# Patient Record
Sex: Male | Born: 1955 | Race: White | Hispanic: No | Marital: Married | Smoking: Former smoker
Health system: Southern US, Community
[De-identification: ages and names within clinical notes are randomized; demographics above are authoritative.]

## PROBLEM LIST (undated history)

## (undated) DIAGNOSIS — I4510 Unspecified right bundle-branch block: Secondary | ICD-10-CM

## (undated) DIAGNOSIS — E785 Hyperlipidemia, unspecified: Secondary | ICD-10-CM

## (undated) DIAGNOSIS — I10 Essential (primary) hypertension: Secondary | ICD-10-CM

## (undated) DIAGNOSIS — C801 Malignant (primary) neoplasm, unspecified: Secondary | ICD-10-CM

## (undated) DIAGNOSIS — R000 Tachycardia, unspecified: Secondary | ICD-10-CM

## (undated) DIAGNOSIS — Z0389 Encounter for observation for other suspected diseases and conditions ruled out: Secondary | ICD-10-CM

## (undated) DIAGNOSIS — T7840XA Allergy, unspecified, initial encounter: Secondary | ICD-10-CM

## (undated) HISTORY — DX: Encounter for observation for other suspected diseases and conditions ruled out: Z03.89

## (undated) HISTORY — DX: Essential (primary) hypertension: I10

## (undated) HISTORY — DX: Hyperlipidemia, unspecified: E78.5

## (undated) HISTORY — DX: Allergy, unspecified, initial encounter: T78.40XA

## (undated) HISTORY — DX: Unspecified right bundle-branch block: I45.10

## (undated) HISTORY — DX: Tachycardia, unspecified: R00.0

---

## 2002-12-21 HISTORY — PX: OTHER SURGICAL HISTORY: SHX169

## 2003-08-14 ENCOUNTER — Encounter (INDEPENDENT_AMBULATORY_CARE_PROVIDER_SITE_OTHER): Payer: Self-pay | Admitting: *Deleted

## 2003-08-14 ENCOUNTER — Ambulatory Visit (HOSPITAL_BASED_OUTPATIENT_CLINIC_OR_DEPARTMENT_OTHER): Admission: RE | Admit: 2003-08-14 | Discharge: 2003-08-14 | Payer: Self-pay | Admitting: Surgery

## 2005-04-02 ENCOUNTER — Ambulatory Visit: Payer: Self-pay | Admitting: Cardiology

## 2005-04-07 ENCOUNTER — Ambulatory Visit: Payer: Self-pay | Admitting: Cardiology

## 2005-04-21 ENCOUNTER — Ambulatory Visit: Payer: Self-pay | Admitting: Cardiology

## 2005-05-06 ENCOUNTER — Ambulatory Visit: Payer: Self-pay | Admitting: Cardiology

## 2005-06-10 ENCOUNTER — Ambulatory Visit: Payer: Self-pay | Admitting: Physician Assistant

## 2005-06-25 ENCOUNTER — Ambulatory Visit: Payer: Self-pay | Admitting: Cardiology

## 2005-11-25 ENCOUNTER — Ambulatory Visit: Payer: Self-pay | Admitting: Cardiology

## 2006-12-23 ENCOUNTER — Ambulatory Visit: Payer: Self-pay | Admitting: Cardiology

## 2007-12-28 ENCOUNTER — Encounter: Payer: Self-pay | Admitting: Cardiology

## 2008-01-19 ENCOUNTER — Encounter: Payer: Self-pay | Admitting: Physician Assistant

## 2008-01-19 ENCOUNTER — Ambulatory Visit: Payer: Self-pay | Admitting: Cardiology

## 2008-08-10 ENCOUNTER — Encounter: Payer: Self-pay | Admitting: Cardiology

## 2009-01-22 ENCOUNTER — Ambulatory Visit: Payer: Self-pay | Admitting: Cardiology

## 2009-10-02 DIAGNOSIS — R0989 Other specified symptoms and signs involving the circulatory and respiratory systems: Secondary | ICD-10-CM | POA: Insufficient documentation

## 2009-10-02 DIAGNOSIS — I4510 Unspecified right bundle-branch block: Secondary | ICD-10-CM

## 2009-10-02 DIAGNOSIS — E785 Hyperlipidemia, unspecified: Secondary | ICD-10-CM

## 2009-10-11 ENCOUNTER — Encounter: Payer: Self-pay | Admitting: Cardiology

## 2010-02-24 ENCOUNTER — Ambulatory Visit: Payer: Self-pay | Admitting: Cardiology

## 2010-02-24 DIAGNOSIS — I10 Essential (primary) hypertension: Secondary | ICD-10-CM | POA: Insufficient documentation

## 2010-03-03 ENCOUNTER — Ambulatory Visit: Payer: Self-pay | Admitting: Cardiology

## 2011-01-20 NOTE — Assessment & Plan Note (Signed)
Summary: bp check  Nurse Visit   Vital Signs:  Patient profile:   55 year old male Height:      72 inches Weight:      177 pounds Pulse rate:   69 / minute BP sitting:   126 / 72  (left arm) Cuff size:   regular  Vitals Entered By: Carlye Grippe (March 03, 2010 8:41 AM) CC: nurse BP check Comments ZO:XWRUEA-VW HTN--yes UJW:JXBJYN meds?--yes Side effects?--no Chest pain, SOB, Dizziness?--no A/P: 1. HTN (401.1)             At goal?              If no, physician will be notified.              Follow up in ...Marland KitchenMarland KitchenMarland Kitchen  5 minutes was spent with the patient. Patient hasn't started new med yet,chlorthalidone.      Allergies: No Known Drug Allergies  Orders Added: 1)  Est. Patient Level I [82956]  Appended Document: bp check Patient informed of the above via machine.

## 2011-01-20 NOTE — Assessment & Plan Note (Signed)
Summary: 1 YR FU PER FEB REMINDER-SRS   Visit Type:  Follow-up Primary Provider:  Christell Constant  CC:  follow-up visit.  History of Present Illness: the patient is a 55 year old male with a history of tachycardia palpitations.  he has a history of atrial tachycardia.  He has been intolerant to high-dose beta-blockers in the past.he has had a prior stress test which was within normal limits.  He denies any recent palpitations.  He has occasional heartburn and is under significant stress.  Is quite hypertensive in the office today with a diastolic blood pressure of 89 mm of mercury.  The patient otherwise denies palpitations, presyncope or syncope.  He has no orthopnea PND.  Clinical Review Panels:  CXR CXR results The heart and mediastinum are within normal limits. The lung fields         are clear. The visualized soft tissues and skeleton are within normal         limits.                       IMPRESSION:           CHEST RADIOGRAPH IS WITHIN NORMAL LIMITS.  (04/01/2005)    Preventive Screening-Counseling & Management  Alcohol-Tobacco     Smoking Status: quit     Year Quit: 2005  Current Medications (verified): 1)  Diltiazem Hcl Coated Beads 120 Mg Xr24h-Cap (Diltiazem Hcl Coated Beads) .... Take 1 Capsule By Mouth Once A Day 2)  Metoprolol Tartrate 50 Mg Tabs (Metoprolol Tartrate) .... Take 1/2 Tablet By Mouth Twice A Day 3)  Aspir-Trin 325 Mg Tbec (Aspirin) .... Take 1 Tablet By Mouth Once A Day As Needed 4)  Chlorthalidone 25 Mg Tabs (Chlorthalidone) .... Take 1 Tablet By Mouth Once A Day  Allergies (verified): No Known Drug Allergies  Comments:  Nurse/Medical Assistant: The patient's medications and allergies were reviewed with the patient and were updated in the Medication and Allergy Lists. Bottles reviewed.  Past History:  Past Medical History: HYPERLIPIDEMIA-MIXED (ICD-272.4) RBBB (ICD-426.4) TACHYCARDIA (ICD-785)  History of questionable atrial tachycardia normal  left ventricle function by echo in 2006 chronic incomplete right bundle branch block history of tobacco use dyslipidemia negative nuclear perfusion study for ischemia in 2006.  Review of Systems  The patient denies fatigue, malaise, fever, weight gain/loss, vision loss, decreased hearing, hoarseness, chest pain, palpitations, shortness of breath, prolonged cough, wheezing, sleep apnea, coughing up blood, abdominal pain, blood in stool, nausea, vomiting, diarrhea, heartburn, incontinence, blood in urine, muscle weakness, joint pain, leg swelling, rash, skin lesions, headache, fainting, dizziness, depression, anxiety, enlarged lymph nodes, easy bruising or bleeding, and environmental allergies.    Vital Signs:  Patient profile:   55 year old male Height:      72 inches Weight:      176 pounds BMI:     23.96 Pulse rate:   69 / minute BP sitting:   134 / 89  (left arm) Cuff size:   regular  Vitals Entered By: Carlye Grippe (February 24, 2010 3:16 PM)  Serial Vital Signs/Assessments:  Time      Position  BP       Pulse  Resp  Temp     By 4:20 PM             143/90                         Carlye Grippe  CC: follow-up  visit   Physical Exam  Additional Exam:  General: Well-developed, well-nourished in no distress head: Normocephalic and atraumatic eyes PERRLA/EOMI intact, conjunctiva and lids normal nose: No deformity or lesions mouth normal dentition, normal posterior pharynx neck: Supple, no JVD.  No masses, thyromegaly or abnormal cervical nodes lungs: Normal breath sounds bilaterally without wheezing.  Normal percussion heart: regular rate and rhythm with normal S1 and S2, no S3 or S4.  PMI is normal.  No pathological murmurs abdomen: Normal bowel sounds, abdomen is soft and nontender without masses, organomegaly or hernias noted.  No hepatosplenomegaly musculoskeletal: Back normal, normal gait muscle strength and tone normal pulsus: Pulse is normal in all 4  extremities Extremities: No peripheral pitting edema neurologic: Alert and oriented x 3 skin: Intact without lesions or rashes cervical nodes: No significant adenopathy psychologic: Normal affect    EKG  Procedure date:  02/24/2010  Findings:      normal sinus rhythm.  Incomplete right bundle branch block.  Otherwise normal tracing heart rate 61 beats/min  Impression & Recommendations:  Problem # 1:  ESSENTIAL HYPERTENSION, BENIGN (ICD-401.1) the patient is significantly hypertensive and I started him on chlorthalidone 25 mg p.o. daily.  On recheck of his blood pressure it remained elevated. His updated medication list for this problem includes:    Diltiazem Hcl Coated Beads 120 Mg Xr24h-cap (Diltiazem hcl coated beads) .Marland Kitchen... Take 1 capsule by mouth once a day    Metoprolol Tartrate 50 Mg Tabs (Metoprolol tartrate) .Marland Kitchen... Take 1/2 tablet by mouth twice a day    Aspir-trin 325 Mg Tbec (Aspirin) .Marland Kitchen... Take 1 tablet by mouth once a day as needed    Chlorthalidone 25 Mg Tabs (Chlorthalidone) .Marland Kitchen... Take 1 tablet by mouth once a day  Problem # 2:  TACHYCARDIA (ICD-785) no recurrent palpitations.  EKG demonstrates normal sinus rhythm. Orders: EKG w/ Interpretation (93000)  Problem # 3:  RBBB (ICD-426.4) incomplete right bundle blanch block no change. His updated medication list for this problem includes:    Diltiazem Hcl Coated Beads 120 Mg Xr24h-cap (Diltiazem hcl coated beads) .Marland Kitchen... Take 1 capsule by mouth once a day    Metoprolol Tartrate 50 Mg Tabs (Metoprolol tartrate) .Marland Kitchen... Take 1/2 tablet by mouth twice a day    Aspir-trin 325 Mg Tbec (Aspirin) .Marland Kitchen... Take 1 tablet by mouth once a day as needed  Problem # 4:  HYPERLIPIDEMIA-MIXED (ICD-272.4) followed by the patient's primary care physician.  Patient Instructions: 1)  Chlorthalidone 25mg  daily 2)  Nurse visit in one week for blood pressure recheck.  (March 14 at 9:00) 3)  Follow up in  1 year.   Prescriptions: METOPROLOL  TARTRATE 50 MG TABS (METOPROLOL TARTRATE) Take 1/2 tablet by mouth twice a day  #30 x 11   Entered by:   Hoover Brunette, LPN   Authorized by:   Lewayne Bunting, MD, [facility]   Signed by:   Hoover Brunette, LPN on 16/09/9603   Method used:   Electronically to        [facility] # [phone]* (retail)       [address]       [place], [place]  [address]       Ph: [phone] or [phone]       Fax: [phone]   RxID:   [id number] DILTIAZEM HCL COATED BEADS 120 MG XR24H-CAP (DILTIAZEM HCL COATED BEADS) Take 1 capsule by mouth once a day  #30 Capsule x 11   Entered  by:   Hoover Brunette, LPN   Authorized by:   Lewayne Bunting, MD, [facility]   Signed by:   Hoover Brunette, LPN on 57/84/6962   Method used:   Electronically to        [facility] [address] # [phone]* (retail)       [address]       [place], [place]  [address]       Ph: [phone] or [phone]       Fax: [phone]   RxID:   [id number] CHLORTHALIDONE 25 MG TABS (CHLORTHALIDONE) Take 1 tablet by mouth once a day  #30 x 11   Entered by:   Hoover Brunette, LPN   Authorized by:   Lewayne Bunting, MD, [facility]   Signed by:   Hoover Brunette, LPN on 60/09/9322   Method used:   Electronically to        [facility] # [phone]* (retail)       [address]       [place], [place]  [address]       Ph: [phone] or [phone]       Fax: [phone]   RxID:   [id number]   Handout requested.

## 2011-03-24 ENCOUNTER — Encounter: Payer: Self-pay | Admitting: Cardiology

## 2011-03-24 ENCOUNTER — Ambulatory Visit (INDEPENDENT_AMBULATORY_CARE_PROVIDER_SITE_OTHER): Payer: 59 | Admitting: Cardiology

## 2011-03-24 DIAGNOSIS — F17200 Nicotine dependence, unspecified, uncomplicated: Secondary | ICD-10-CM

## 2011-03-24 DIAGNOSIS — R0989 Other specified symptoms and signs involving the circulatory and respiratory systems: Secondary | ICD-10-CM

## 2011-03-24 DIAGNOSIS — I10 Essential (primary) hypertension: Secondary | ICD-10-CM

## 2011-03-24 DIAGNOSIS — R002 Palpitations: Secondary | ICD-10-CM

## 2011-03-24 MED ORDER — METOPROLOL TARTRATE 25 MG PO TABS: 25.0000 mg | ORAL_TABLET | Freq: Two times a day (BID) | ORAL | Status: DC

## 2011-03-24 MED ORDER — CHLORTHALIDONE 25 MG PO TABS: ORAL_TABLET | ORAL | Status: DC

## 2011-03-24 MED ORDER — DILTIAZEM HCL ER COATED BEADS 120 MG PO CP24: 120.0000 mg | ORAL_CAPSULE | Freq: Every day | ORAL | Status: DC

## 2011-03-24 NOTE — Patient Instructions (Signed)
Begin Chlorthalidone 12.5mg  daily Your physician wants you to follow up in:  1 year.  You will receive a reminder letter in the mail one-two months in advance.  If you don't receive a letter, please call our office to schedule the follow up appointment

## 2011-04-01 DIAGNOSIS — F17200 Nicotine dependence, unspecified, uncomplicated: Secondary | ICD-10-CM | POA: Insufficient documentation

## 2011-04-01 NOTE — Assessment & Plan Note (Signed)
We will start chlorthalidone

## 2011-04-01 NOTE — Progress Notes (Signed)
HPI The patient is a 55 year old male with a history of tachycardia palpitations. He has a history of atrial tachycardia. He has been intolerant to high-dose beta blockers in the past. He also had a prior Cardiolite which within normal limits. During his last office visit he was quite hypertensive. He did have a negative perfusion study in 2006. The patient was started on chlorthalidone 25 of PEEP daily during the last office visit for his hypertension. The patient's blood pressure again slightly elevated today. We will recheck this before departure from the clinic. We had previously started chlorthalidone potassium was discontinued because the patient's blood pressure was normal upon nurses it. The patient reports rare palpitations. His electrocardiogram was reviewed and found to be within normal limits. I also performed a bedside echocardiogram the patient has normal LV function with no significant wall motion abnormalities and a normal aortic and mitral valve.  No Known Allergies  No current outpatient prescriptions on file prior to visit.    Past Medical History  Diagnosis Date  . Hyperlipidemia   . Right bundle branch block   . Tachycardia   . Dyslipidemia   . Other specified cardiac dysrhythmias     Past Surgical History  Procedure Date  . Left inguinal hernia. 2004    No family history on file.  History   Social History  . Marital Status: Married    Spouse Name: Salome,[name]    Number of Children: N/A  . Years of Education: N/A   Occupational History  .  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 Smoking status: Former Smoker -- 1.0 packs/day for 30 years    Types: Cigarettes    Quit date: 03/21/2005  . Smokeless tobacco: Not on file  . Alcohol Use: No  . Drug Use: No  . Sexually Active: Not on file   Other Topics Concern  . Not on file   Social History Narrative  . No narrative on file   Review of systems:Pertinent positives as  outlined above. The remainder of the 18  point review of systems is negative   PHYSICAL EXAM BP 148/88  Pulse 64  Ht 6' (1.829 m)  Wt 175 lb (79.379 kg)  BMI 23.73 kg/m2  General: Well-developed, well-nourished in no distress Head: Normocephalic and atraumatic Eyes:PERRLA/EOMI intact, conjunctiva and lids normal Ears: No deformity or lesions Mouth:normal dentition, normal posterior pharynx Neck: Supple, no JVD.  No masses, thyromegaly or abnormal cervical nodes Lungs: Normal breath sounds bilaterally without wheezing.  Normal percussion Cardiac: regular rate and rhythm with normal S1 and S2, no S3 or S4.  PMI is normal.  No pathological murmurs Abdomen: Normal bowel sounds, abdomen is soft and nontender without masses, organomegaly or hernias noted.  No hepatosplenomegaly MSK: Back normal, normal gait muscle strength and tone normal Vascular: Pulse is normal in all 4 extremities Extremities: No peripheral pitting edema Neurologic: Alert and oriented x 3 Skin: Intact without lesions or rashes Lymphatics: No significant adenopathy Psychologic: Normal affect  ECG: Normal sinus rhythm. Otherwise normal EKG.  ASSESSMENT AND PLAN

## 2011-04-01 NOTE — Assessment & Plan Note (Signed)
Patient was counseled regarding this

## 2011-05-05 NOTE — Assessment & Plan Note (Signed)
[facility]                          [facility] CARDIOLOGY OFFICE NOTE   Martin Martin, UMSCHEID                     MRN:          [id number]  DATE:01/22/2009                            DOB:          1956/11/27    HISTORY OF PRESENT ILLNESS:  [name] is a 55 year old male with a  prior history of tachy palpitations, but no exertional angina or  dyspnea.  He underwent a prior stress test which was within normal  limits.  The patient still reports occasional palpitations which last 10-  15 minutes; however, he also has some issues with anxiety.  His biggest  problem right now is also that he cannot sleep because he has ruminating  thoughts in the evening and feels fatigued throughout the daytime.  He  has no orthopnea or PND.  He has no presyncope or syncope.  He has no  known history of coronary artery disease.   EKG in the office today demonstrates normal sinus rhythm with incomplete  right bundle branch block, unchanged from prior tracings.   CURRENT MEDICATIONS:  1. Diltiazem XR 120 mg in the morning.  2. Metoprolol 50 mg half tablet twice a day.   PHYSICAL EXAMINATION:  VITAL SIGNS:  Blood pressure is 137/85, heart  rate 61, and weight 185 pounds.  NECK:  Normal carotid upstroke and no carotid bruits.  LUNGS:  Clear breath sounds bilaterally.  HEART:  Regular rate and rhythm with normal S1 and S2.  No murmur, rub,  or gallops.  ABDOMEN:  Soft and nontender.  No rebound or guarding, and good bowel  sounds.  EXTREMITIES:  No cyanosis, clubbing, or edema.  NEURO:  The patient is alert, oriented, and grossly nonfocal.   IMPRESSION:  1. History of questionable atrial tachycardia.      a.     Quiescent on beta-blocker and calcium channel blocker.       However, the patient still as strong heartbeats.      b.     Intolerant to higher dose BETA-BLOCKER secondary to fatigue.  2. Normal left ventricular function by echo in 2006.  3. Chronic  incomplete right bundle branch block.  4. History of tobacco use.  5. Dyslipidemia.  6. Negative nuclear perfusion study negative for ischemia in 2006.   PLAN:  1. The patient's biggest problem is generalized anxiety disorder      manifested by agitation during the daytime and nighttime sleeping      problems.  I have written for the patient a      prescription for trazodone 50 mg to increase after 1 week to 100 mg      p.o. nightly.  2. The patient does not need any further cardiac stress testing.  He      has been told to call our office if he has any problems with      trazodone and otherwise we will see him back in 1 year.     Learta Codding, MD,FACC  Electronically Signed    GED/MedQ  DD: 01/22/2009  DT: 01/23/2009  Job #: [id number]

## 2011-05-05 NOTE — Assessment & Plan Note (Signed)
[facility]                          [place] [facility] OFFICE NOTE   DUWARD, ALLBRITTON                       MRN:          [id number]  DATE:01/19/2008                            DOB:          08-02-1956    PRIMARY CARDIOLOGIST:  Learta Codding, MD, [facility]   REASON FOR VISIT:  Annual follow-up.   Mr. Kuhnle returns for annual follow-up, with history of atrial  tachycardia and no known history of coronary artery disease.   The patient reports no interim development of recurrent tachy  palpitations, since last seen here in the clinic in January 2008.  He  also denies any development of exertional angina pectoris or dyspnea.   A recent lipid profile was reviewed with him, and notable for total  cholesterol of 220, triglyceride 71, HDL 58 and LDL 156.  In reviewing  these results, I recommended initial treatment with diet/exercise  regimen and I reemphasized this point to him, today.   At time of his last visit, I also recommended adding low-dose aspirin to  his medication regimen, for primary prevention.  He informs me today  that he takes this when he remembers to do so, but clearly not on a  daily basis.   Electrocardiogram today reveals NSR with chronic incomplete RBB.   CURRENT MEDICATIONS:  1. Diltiazem XR 120 q. a.m.  2. Metoprolol 25 mg b.i.d.   PHYSICAL EXAM:  Blood pressure 133/85, pulse 65, regular weight 174.6  (down seven).  GENERAL:  A 55 year old male sitting upright in no distress.  HEENT: Normocephalic, atraumatic.  NECK: Palpable bilateral carotid pulses without bruits.  LUNGS:  Clear to auscultation in all fields.  HEART: Regular rate and rhythm (S1, S2). No murmurs or rubs.  ABDOMEN: Soft, nontender.  EXTREMITIES: Palpable pulses without edema.  NEURO: No focal deficit.   IMPRESSION:  1. History of atrial tachycardia.      a.     Quiescent on beta blocker/calcium channel blocker regimen.      b.     INTOLERANT TO HIGHER  DOSE BETA BLOCKER, SECONDARY TO       FATIGUE.  2. Normal left ventricular function.      a.     By 2-D echocardiogram in 2006.  3. Chronic iRBBB.  4. History of tobacco.  5. Dyslipidemia   PLAN:  1. Recommend initial diet/exercise treatment for dyslipidemia.  We      will reassess this with a follow-up fasting lipid/liver profile in      6 months.  2. Patient encouraged, once again, to take low-dose aspirin on a daily      basis, for primary prevention.  3. Schedule return clinic follow-up of with myself and Dr. Andee Lineman in 1      year.      Gene Serpe, PA-C  Electronically Signed      Learta Codding, MD,FACC  Electronically Signed   GS/MedQ  DD: 01/19/2008  DT: 01/19/2008  Job #: [id number]   cc:   Ernestina Penna, M.D.

## 2011-05-08 NOTE — Assessment & Plan Note (Signed)
[facility]                          [facility] [facility] OFFICE NOTE   AMBROSIO, REUTER                       MRN:          [id number]  DATE:12/23/2006                            DOB:          September 20, 1956    PRIMARY CARDIOLOGIST:  Learta Codding, MD   REASON FOR OFFICE VISIT:  Scheduled annual follow-up.   [name] is a 55 year old male with a history of atrial tachycardia  which has been successfully treated with a combination of a beta blocker  and calcium channel blocker.  He has no known history of coronary artery  disease.  When last seen by me in June 2006, we down-titrated his beta  blocker secondary to complaint of fatigue.  This apparently ameliorated  his fatigue and he has been tolerating this combination regimen since  then.   The patient has only an occasional sensation of a hard heart beat but  no irregularity and no fluttering.  He continues to work and denies any  exertional chest discomfort.  An adenosine stress Cardiolite in April  2006 was negative.  2 D echocardiogram at that same time was also  normal.   The patient has not smoked tobacco since April 2006.  Of note, he has  not had a recent fasting lipid profile.   Electrocardiogram today revealed normal sinus rhythm at 72 BPM with  normal axis and incomplete right bundle branch block.   CURRENT MEDICATIONS:  1. Lopressor 25 mg b.i.d.  2. Diltiazem XR 120 mg daily.   PHYSICAL EXAMINATION:  VITAL SIGNS:  Blood pressure 122/82, pulse 72,  regular, weight 181.  GENERAL:  A 55 year old male sitting upright in no distress.  NECK:  Palpable bilateral carotid pulse without bruit.  LUNGS:  Clear to auscultation in all fields.  HEART:  Regular rate and rhythm (S1, S2), no murmurs, rubs or gallops.  EXTREMITIES:  Palpable pulses without edema.  NEUROLOGIC:  No focal deficits.   IMPRESSION:  1. History of atrial tachycardia.      a.     Quiescent on beta blocker/calcium  channel blocker regimen.  2. History of fatigue.      a.     Secondary to higher dose of beta blocker.  3. Normal left ventricular function.  4. History of tobacco.  5. Incomplete right bundle branch block.   PLAN:  1. Start low-dose baby aspirin for primary prevention.  2. Order a fasting lipid profile for assessment of his lipid status.  3. Continue regular follow-up with Dr. Lewayne Bunting in one year.      Gene Serpe, PA-C  Electronically Signed      Learta Codding, MD,FACC  Electronically Signed   GS/MedQ  DD: 12/23/2006  DT: 12/23/2006  Job #: [id number]   cc:   Ernestina Penna, M.D.

## 2011-05-08 NOTE — Op Note (Signed)
NAMEHAMDAN, TOSCANO                          ACCOUNT NO.:  [id number]   MEDICAL RECORD NO.:  [id number]                   PATIENT TYPE:  AMB   LOCATION:  DSC                                  FACILITY:  [facility]   PHYSICIAN:  Sandria Bales. Ezzard Standing, M.D.               DATE OF BIRTH:  October 18, 1956   DATE OF PROCEDURE:  08/14/2003  DATE OF DISCHARGE:                                 OPERATIVE REPORT   PREOPERATIVE DIAGNOSIS:  Left inguinal hernia.   POSTOPERATIVE DIAGNOSIS:  Moderate-sized indirect left inguinal hernia.   OPERATION PERFORMED:  Left inguinal herniorrhaphy with mesh repair.   SURGEON:  Sandria Bales. Ezzard Standing, M.D.   ASSISTANT:  None.   ANESTHESIA:  MAC with about 26mL of local anesthetic.   COMPLICATIONS:  None.   INDICATIONS FOR PROCEDURE:  Mr. Kramp is a 55 year old white male with  symptomatic left inguinal hernia who now comes for repair of this hernia.  The indications and potential complications were explained to the patient.  Potential complications include but are not limited to bleeding, infection,  nerve injury, recurrence of the inguinal hernia.  I also discussed the  options of laparoscopic versus open herniorrhaphy and the patient has  elected to proceed with an open inguinal hernia repair.   DESCRIPTION OF PROCEDURE:  The patient presented to [name], was placed in supine position and his left groin was shaved, prepped  with Betadine solution and sterilely draped.   The patient was given a MAC anesthesia.  I used local anesthetic with 1%  Xylocaine with epinephrine and infiltrated about 26mL in different layers.  I made incision through the skin to the external oblique fascia which was  also blocked.  The external ring was opened.  The cord structures were  encircled with a Penrose drain. The external ring was noted to be quite a  bit enlarged by this hernia which has been descending down the cord  structures out this external ring.   The inguinal floor actually looked pretty good, but the patient had a medium  to large indirect inguinal hernia with a very broad neck and protruded about  6 or 7 cm.  I freed the indirect hernia off the cord structures, I opened  the sac, I freed some of the fat off so I could twist the sac and then  ligated it with a 0 chromic suture.  I then carried out an inguinal floor  repair using Atrium mesh.  The Atrium mesh was sewn medially to the pubic  tubercle inferiorly to the shelving ligament, superiorly to the  transversalis fascia.  A key hole was cut for the internal ring and then the  mesh overlapped behind and sewn.  I used 0 Novofil sutures in interrupted  fashion to put the mesh in place.   The cord structures were then returned to their normal location.  The wound  was irrigated.  The external oblique fascia closed with interrupted 3-0  Vicryl sutures.  Subcutaneous tissues closed with 3-0 Vicryl sutures and  skin  closed with a 5-0 Monocryl suture and painted with tincture of benzoin.  The  patient tolerated the procedure well and was transported to recovery room in  good condition.  The sponge and needle counts were correct at the end of  this case.                                               Sandria Bales. Ezzard Standing, M.D.    DHN/MEDQ  D:  08/14/2003  T:  08/14/2003  Job:  [id number]   cc:   Montey Hora, PA   Ernestina Penna, M.D.  [address] [place]  [place] [address]  Fax: [phone]

## 2012-03-04 ENCOUNTER — Encounter: Payer: Self-pay | Admitting: Cardiology

## 2012-03-04 ENCOUNTER — Ambulatory Visit (INDEPENDENT_AMBULATORY_CARE_PROVIDER_SITE_OTHER): Payer: 59 | Admitting: Cardiology

## 2012-03-04 VITALS — BP 134/73 | HR 61 | Ht 72.0 in | Wt 170.0 lb

## 2012-03-04 DIAGNOSIS — I4510 Unspecified right bundle-branch block: Secondary | ICD-10-CM | POA: Insufficient documentation

## 2012-03-04 DIAGNOSIS — E785 Hyperlipidemia, unspecified: Secondary | ICD-10-CM

## 2012-03-04 DIAGNOSIS — R002 Palpitations: Secondary | ICD-10-CM

## 2012-03-04 DIAGNOSIS — R000 Tachycardia, unspecified: Secondary | ICD-10-CM

## 2012-03-04 DIAGNOSIS — I10 Essential (primary) hypertension: Secondary | ICD-10-CM | POA: Insufficient documentation

## 2012-03-04 DIAGNOSIS — Z0389 Encounter for observation for other suspected diseases and conditions ruled out: Secondary | ICD-10-CM

## 2012-03-04 MED ORDER — METOPROLOL TARTRATE 25 MG PO TABS: 25.0000 mg | ORAL_TABLET | Freq: Two times a day (BID) | ORAL | Status: DC

## 2012-03-04 MED ORDER — DILTIAZEM HCL ER COATED BEADS 120 MG PO CP24: 120.0000 mg | ORAL_CAPSULE | Freq: Every day | ORAL | Status: DC

## 2012-03-04 MED ORDER — CHLORTHALIDONE 25 MG PO TABS: ORAL_TABLET | ORAL | Status: DC

## 2012-03-04 NOTE — Patient Instructions (Signed)
Continue all current medications. Your physician wants you to follow up in:  1 year.  You will receive a reminder letter in the mail one-two months in advance.  If you don't receive a letter, please call our office to schedule the follow up appointment   

## 2012-03-04 NOTE — Progress Notes (Signed)
Martin Bottoms, MD, [facility] Board Certified in Adult Cardiovascular Medicine,Internal Medicine and Critical Care Medicine    CC: Followup patient history of hypertension   HPI:  Patient is a 56 year old male with no history of coronary artery disease. The patient is doing well. He denies any chest pain shortness of breath orthopnea PND. He reports no palpitations. He feels more relaxed now that he has changed jobs. He has not had any blood work drawn and I suggested that he may need to have this done with his primary care physician. During the last office visit we didn't bedside echocardiogram which showed normal heart function. There was no significant valvular heart disease.  PMH: reviewed and listed in Problem List in Electronic Records (and see below) Past Medical History  Diagnosis Date  . Hyperlipidemia   . Right bundle branch block     Normal LV function normal aortic and mitral valve bedside echocardiogram 4 2012  . Tachycardia     Resolved  . Dyslipidemia   . Coronary artery disease (CAD) excluded     Cardiolite study 2006 within normal limits  . Hypertension    Past Surgical History  Procedure Date  . Left inguinal hernia. 2004    Allergies/SH/FHX : available in Electronic Records for review  No Known Allergies History   Social History  . Marital Status: Married    Spouse Name: Spiers,[name]    Number of Children: N/A  . Years of Education: N/A   Occupational History  .  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 Smoking status: Former Smoker -- 1.0 packs/day for 30 years    Types: Cigarettes    Quit date: 03/21/2005  . Smokeless tobacco: Never Used  . Alcohol Use: No  . Drug Use: No  . Sexually Active: Not on file   Other Topics Concern  . Not on file   Social History Narrative  . No narrative on file   No family history on file.  Medications: Current Outpatient Prescriptions  Medication Sig Dispense Refill  .  chlorthalidone (HYGROTON) 25 MG tablet Take 1/2 tab (12.5mg ) daily  45 tablet  3  . diltiazem (CARDIZEM CD) 120 MG 24 hr capsule Take 1 capsule (120 mg total) by mouth daily.  90 capsule  3  . metoprolol tartrate (LOPRESSOR) 25 MG tablet Take 1 tablet (25 mg total) by mouth 2 (two) times daily.  180 tablet  3    ROS: No nausea or vomiting. No fever or chills.No melena or hematochezia.No bleeding.No claudication  Physical Exam: BP 134/73  Pulse 61  Ht 6' (1.829 m)  Wt 170 lb (77.111 kg)  BMI 23.06 kg/m2 General: Well-nourished white male. No distress Neck: Normal curvature and no carotid bruits Lungs: Clear breath sounds bilaterally. No wheezing Cardiac: Regular rate and rhythm with normal S1-S2 no murmur rubs or gallops Vascular: No edema. Normal distal pulses. Skin: Warm and dry Physcologic: Normal affect.  12lead ECG: Normal sinus rhythm no acute ischemic changes. Limited bedside ECHO:N/A No images are attached to the encounter.    Patient Active Problem List  Diagnoses  . HYPERLIPIDEMIA-MIXED  . TACHYCARDIA  . Hypertension  . Coronary artery disease (CAD) excluded  . Dyslipidemia  . Right bundle branch block    PLAN   I recommended to the patient has some blood work from his primary care physician, including a lipid panel CBC and BMET.  From a cardiac standpoint is stable. He  reports no recurrent palpitations.  He has no evidence of structural heart disease. Continue with current medical therapy  Blood pressures now also well controlled on chlorthalidone.

## 2013-03-17 ENCOUNTER — Other Ambulatory Visit: Payer: Self-pay | Admitting: Cardiology

## 2013-03-17 ENCOUNTER — Ambulatory Visit: Payer: 59 | Admitting: Cardiology

## 2013-03-24 ENCOUNTER — Emergency Department (HOSPITAL_COMMUNITY): Payer: BC Managed Care – PPO

## 2013-03-24 ENCOUNTER — Other Ambulatory Visit: Payer: Self-pay | Admitting: Neurosurgery

## 2013-03-24 ENCOUNTER — Encounter (HOSPITAL_COMMUNITY): Payer: Self-pay | Admitting: Neurology

## 2013-03-24 ENCOUNTER — Inpatient Hospital Stay (HOSPITAL_COMMUNITY)
Admission: EM | Admit: 2013-03-24 | Discharge: 2013-04-02 | DRG: 002 | Disposition: A | Discharge status: Home / Self Care | Payer: BC Managed Care – PPO | Attending: Neurosurgery | Admitting: Neurosurgery

## 2013-03-24 DIAGNOSIS — I10 Essential (primary) hypertension: Secondary | ICD-10-CM | POA: Diagnosis present

## 2013-03-24 DIAGNOSIS — E785 Hyperlipidemia, unspecified: Secondary | ICD-10-CM | POA: Diagnosis present

## 2013-03-24 DIAGNOSIS — R4701 Aphasia: Secondary | ICD-10-CM | POA: Diagnosis present

## 2013-03-24 DIAGNOSIS — I4510 Unspecified right bundle-branch block: Secondary | ICD-10-CM | POA: Diagnosis present

## 2013-03-24 DIAGNOSIS — G40109 Localization-related (focal) (partial) symptomatic epilepsy and epileptic syndromes with simple partial seizures, not intractable, without status epilepticus: Secondary | ICD-10-CM | POA: Diagnosis present

## 2013-03-24 DIAGNOSIS — R51 Headache: Secondary | ICD-10-CM | POA: Diagnosis present

## 2013-03-24 DIAGNOSIS — G936 Cerebral edema: Secondary | ICD-10-CM | POA: Diagnosis present

## 2013-03-24 DIAGNOSIS — Z87891 Personal history of nicotine dependence: Secondary | ICD-10-CM

## 2013-03-24 DIAGNOSIS — Z885 Allergy status to narcotic agent status: Secondary | ICD-10-CM

## 2013-03-24 DIAGNOSIS — N200 Calculus of kidney: Secondary | ICD-10-CM | POA: Diagnosis present

## 2013-03-24 DIAGNOSIS — C713 Malignant neoplasm of parietal lobe: Principal | ICD-10-CM | POA: Diagnosis present

## 2013-03-24 DIAGNOSIS — R569 Unspecified convulsions: Secondary | ICD-10-CM | POA: Diagnosis present

## 2013-03-24 DIAGNOSIS — D496 Neoplasm of unspecified behavior of brain: Secondary | ICD-10-CM

## 2013-03-24 LAB — CBC
MCH: 31.4 pg (ref 26.0–34.0)
MCHC: 35.4 g/dL (ref 30.0–36.0)
MCV: 88.6 fL (ref 78.0–100.0)
Platelets: 218 10*3/uL (ref 150–400)
RBC: 4.91 MIL/uL (ref 4.22–5.81)

## 2013-03-24 LAB — URINALYSIS, ROUTINE W REFLEX MICROSCOPIC
Bilirubin Urine: NEGATIVE
Glucose, UA: NEGATIVE mg/dL
Ketones, ur: 15 mg/dL — AB
pH: 5 (ref 5.0–8.0)

## 2013-03-24 LAB — ETHANOL: Alcohol, Ethyl (B): <11 mg/dL (ref 0–11)

## 2013-03-24 LAB — DIFFERENTIAL
Basophils Relative: 1 % (ref 0–1)
Eosinophils Absolute: 0.1 10*3/uL (ref 0.0–0.7)
Eosinophils Relative: 2 % (ref 0–5)
Lymphs Abs: 1.7 10*3/uL (ref 0.7–4.0)

## 2013-03-24 LAB — POCT I-STAT, CHEM 8
BUN: 25 mg/dL — ABNORMAL HIGH (ref 6–23)
Calcium, Ion: 1.1 mmol/L — ABNORMAL LOW (ref 1.12–1.23)
Chloride: 104 mEq/L (ref 96–112)
Glucose, Bld: 138 mg/dL — ABNORMAL HIGH (ref 70–99)

## 2013-03-24 LAB — COMPREHENSIVE METABOLIC PANEL
ALT: 20 U/L (ref 0–53)
BUN: 26 mg/dL — ABNORMAL HIGH (ref 6–23)
Calcium: 9.1 mg/dL (ref 8.4–10.5)
GFR calc Af Amer: >90 mL/min (ref >90)
Glucose, Bld: 137 mg/dL — ABNORMAL HIGH (ref 70–99)
Sodium: 140 mEq/L (ref 135–145)
Total Protein: 7.1 g/dL (ref 6.0–8.3)

## 2013-03-24 LAB — RAPID URINE DRUG SCREEN, HOSP PERFORMED
Benzodiazepines: NOT DETECTED
Cocaine: NOT DETECTED

## 2013-03-24 LAB — PROTIME-INR: Prothrombin Time: 12.5 seconds (ref 11.6–15.2)

## 2013-03-24 LAB — POCT I-STAT TROPONIN I: Troponin i, poc: 0 ng/mL (ref 0.00–0.08)

## 2013-03-24 LAB — MRSA PCR SCREENING: MRSA by PCR: NEGATIVE

## 2013-03-24 LAB — URINE MICROSCOPIC-ADD ON

## 2013-03-24 LAB — TROPONIN I: Troponin I: <0.3 ng/mL (ref <0.30)

## 2013-03-24 MED ORDER — PANTOPRAZOLE SODIUM 40 MG PO TBEC
40.0000 mg | DELAYED_RELEASE_TABLET | Freq: Every day | ORAL | Status: DC
  Administered 2013-03-24 – 2013-03-28 (×5): 40 mg via ORAL
  Filled 2013-03-24 (×5): qty 1

## 2013-03-24 MED ORDER — HYDROMORPHONE HCL PF 1 MG/ML IJ SOLN: 2.0000 mg | INTRAMUSCULAR | Status: DC | PRN

## 2013-03-24 MED ORDER — ONDANSETRON HCL 4 MG/2ML IJ SOLN: 4.0000 mg | Freq: Once | INTRAMUSCULAR | Status: AC

## 2013-03-24 MED ORDER — HYDROMORPHONE HCL PF 1 MG/ML IJ SOLN: 2.0000 mg | Freq: Once | INTRAMUSCULAR | Status: AC

## 2013-03-24 MED ORDER — HYDROMORPHONE HCL PF 2 MG/ML IJ SOLN
2.0000 mg | Freq: Once | INTRAMUSCULAR | Status: AC
  Administered 2013-03-24: 2 mg via INTRAVENOUS
  Filled 2013-03-24: qty 1

## 2013-03-24 MED ORDER — SODIUM CHLORIDE 0.9 % IV SOLN
10.0000 mg | Freq: Once | INTRAVENOUS | Status: AC
  Administered 2013-03-24: 10 mg via INTRAVENOUS
  Filled 2013-03-24: qty 1

## 2013-03-24 MED ORDER — PROMETHAZINE HCL 25 MG PO TABS: 12.5000 mg | ORAL_TABLET | ORAL | Status: DC | PRN

## 2013-03-24 MED ORDER — SODIUM CHLORIDE 0.9 % IV SOLN
1000.0000 mg | Freq: Once | INTRAVENOUS | Status: AC
  Administered 2013-03-24: 1000 mg via INTRAVENOUS
  Filled 2013-03-24: qty 10

## 2013-03-24 MED ORDER — LEVETIRACETAM 500 MG/5ML IV SOLN
500.0000 mg | Freq: Two times a day (BID) | INTRAVENOUS | Status: DC
  Administered 2013-03-24 – 2013-03-26 (×4): 500 mg via INTRAVENOUS
  Filled 2013-03-24 (×5): qty 5

## 2013-03-24 MED ORDER — HYDROCODONE-ACETAMINOPHEN 10-325 MG PO TABS
1.0000 | ORAL_TABLET | ORAL | Status: DC | PRN
  Administered 2013-03-25 – 2013-03-28 (×7): 1 via ORAL
  Filled 2013-03-24 (×9): qty 1

## 2013-03-24 MED ORDER — ONDANSETRON HCL 4 MG/2ML IJ SOLN
INTRAMUSCULAR | Status: AC
  Administered 2013-03-24: 4 mg
  Filled 2013-03-24: qty 2

## 2013-03-24 MED ORDER — LORAZEPAM 2 MG/ML IJ SOLN
1.0000 mg | Freq: Once | INTRAMUSCULAR | Status: AC
  Administered 2013-03-24: 1 mg via INTRAVENOUS
  Filled 2013-03-24: qty 1

## 2013-03-24 MED ORDER — ONDANSETRON HCL 4 MG/2ML IJ SOLN
INTRAMUSCULAR | Status: AC
  Administered 2013-03-24: 4 mg via INTRAVENOUS
  Filled 2013-03-24: qty 2

## 2013-03-24 MED ORDER — PROMETHAZINE HCL 25 MG/ML IJ SOLN
12.5000 mg | INTRAMUSCULAR | Status: DC | PRN
  Administered 2013-03-24 – 2013-03-25 (×2): 25 mg via INTRAVENOUS
  Filled 2013-03-24 (×3): qty 1

## 2013-03-24 MED ORDER — GADOBENATE DIMEGLUMINE 529 MG/ML IV SOLN
15.0000 mL | Freq: Once | INTRAVENOUS | Status: AC
  Administered 2013-03-24: 15 mL via INTRAVENOUS

## 2013-03-24 MED ORDER — METOPROLOL TARTRATE 25 MG PO TABS
25.0000 mg | ORAL_TABLET | Freq: Two times a day (BID) | ORAL | Status: DC
  Administered 2013-03-25 – 2013-04-02 (×12): 25 mg via ORAL
  Filled 2013-03-24 (×20): qty 1

## 2013-03-24 MED ORDER — DILTIAZEM HCL ER COATED BEADS 120 MG PO CP24
120.0000 mg | ORAL_CAPSULE | Freq: Every day | ORAL | Status: DC
  Administered 2013-03-24 – 2013-04-02 (×9): 120 mg via ORAL
  Filled 2013-03-24 (×10): qty 1

## 2013-03-24 MED ORDER — ONDANSETRON HCL 4 MG/2ML IJ SOLN
4.0000 mg | Freq: Three times a day (TID) | INTRAMUSCULAR | Status: DC | PRN
  Administered 2013-03-24: 4 mg via INTRAVENOUS
  Filled 2013-03-24: qty 2

## 2013-03-24 MED ORDER — SODIUM CHLORIDE 0.9 % IV SOLN
INTRAVENOUS | Status: DC
  Administered 2013-03-24: 75 mL/h via INTRAVENOUS
  Administered 2013-03-25: 01:00:00 via INTRAVENOUS
  Administered 2013-03-25: 75 mL/h via INTRAVENOUS
  Administered 2013-03-26: 05:00:00 via INTRAVENOUS

## 2013-03-24 MED ORDER — HYDROMORPHONE HCL PF 1 MG/ML IJ SOLN
INTRAMUSCULAR | Status: AC
  Administered 2013-03-24: 2 mg
  Filled 2013-03-24: qty 2

## 2013-03-24 MED ORDER — DEXAMETHASONE SODIUM PHOSPHATE 4 MG/ML IJ SOLN
4.0000 mg | Freq: Four times a day (QID) | INTRAMUSCULAR | Status: DC
  Administered 2013-03-24 – 2013-03-27 (×12): 4 mg via INTRAVENOUS
  Filled 2013-03-24 (×16): qty 1

## 2013-03-24 NOTE — Consult Note (Addendum)
NEURO HOSPITALIST CONSULT NOTE    Reason for Consult: New-onset visual changes and confusion.  HPI:                                                                                                                                          Martin Martin is an 57 y.o. male with a history of hypertension and hyperlipidemia presenting with new onset visual changes and confusion. Wife noted that his speech output at times make no sense. He was complaining of visual changes that description was vague and difficult to understand. He complained of a headache as well. He did not appear to have focal weakness. Still previous history of similar symptoms. CT scan of his head showed a low density area involving the left parietal region with vasogenic edema indicative of probable mass lesion. MRI showed left parietal contrast-enhancing lesion, likely glioblastoma.  Past Medical History  Diagnosis Date  . Hyperlipidemia   . Right bundle branch block     Normal LV function normal aortic and mitral valve bedside echocardiogram 4 2012  . Tachycardia     Resolved  . Dyslipidemia   . Coronary artery disease (CAD) excluded     Cardiolite study 2006 within normal limits  . Hypertension     Past Surgical History  Procedure Laterality Date  . Left inguinal hernia.  2004    No family history on file.   Social History:  reports that he quit smoking about 8 years ago. His smoking use included Cigarettes. He has a 30 pack-year smoking history. He has never used smokeless tobacco. He reports that he does not drink alcohol or use illicit drugs.  No Known Allergies  MEDICATIONS:                                                                                                                     Prior to Admission:  Metoprolol 25 mg twice a day Cardizem extended release 120 mg per day  ROS:  History obtained from patient's spouse   General ROS: negative for - chills, fatigue, fever, night sweats, weight gain or weight loss Psychological ROS: negative for - behavioral disorder, hallucinations, memory difficulties, mood swings or suicidal ideation Ophthalmic ROS: negative for - blurry vision, double vision, eye pain or loss of vision ENT ROS: negative for - epistaxis, nasal discharge, oral lesions, sore throat, tinnitus or vertigo Allergy and Immunology ROS: negative for - hives or itchy/watery eyes Hematological and Lymphatic ROS: negative for - bleeding problems, bruising or swollen lymph nodes Endocrine ROS: negative for - galactorrhea, hair pattern changes, polydipsia/polyuria or temperature intolerance Respiratory ROS: negative for - cough, hemoptysis, shortness of breath or wheezing Cardiovascular ROS: negative for - chest pain, dyspnea on exertion, edema or irregular heartbeat Gastrointestinal ROS: negative for - abdominal pain, diarrhea, hematemesis, nausea/vomiting or stool incontinence Genito-Urinary ROS: negative for - dysuria, hematuria, incontinence or urinary frequency/urgency Musculoskeletal ROS: negative for - joint swelling or muscular weakness Neurological ROS: as noted in HPI; no recent headaches nor mental status changes; no visual changes prior to today.  Dermatological ROS: negative for rash and skin lesion changes   Blood pressure 140/76, pulse 84, temperature 99.1 F (37.3 C), temperature source Oral, resp. rate 14, SpO2 99.00%.   Neurologic Examination:                                                                                                      Mental Status: Alert, moderately severe receptive and expressive aphasia. Cranial Nerves: II-dense right homonymous hemianopsia. III/IV/VI-Pupils were equal and reacted. Extraocular movements were full and conjugate.    V/VII-no facial numbness; mild right lower facial  weakness. VIII-normal. X-normal speech and symmetrical palatal movement. XII-midline tongue extension Motor: 5/5 bilaterally with normal tone and bulk Sensory: Normal throughout. Deep Tendon Reflexes: 2+ and symmetric. Plantars: Flexor bilaterally Cerebellar: Normal finger-to-nose testing.   No results found for this basename: cbc, bmp, coags, chol, tri, ldl, hga1c    Results for orders placed during the hospital encounter of 03/24/13 (from the past 48 hour(s))  APTT     Status: None   Collection Time    03/24/13  9:35 AM      Result Value Range   aPTT 28  24 - 37 seconds  CBC     Status: None   Collection Time    03/24/13  9:35 AM      Result Value Range   WBC 8.0  4.0 - 10.5 K/uL   RBC 4.91  4.22 - 5.81 MIL/uL   Hemoglobin 15.4  13.0 - 17.0 g/dL   HCT 40.9  81.1 - 91.4 %   MCV 88.6  78.0 - 100.0 fL   MCH 31.4  26.0 - 34.0 pg   MCHC 35.4  30.0 - 36.0 g/dL   RDW 78.2  95.6 - 21.3 %   Platelets 218  150 - 400 K/uL  DIFFERENTIAL     Status: None   Collection Time    03/24/13  9:35 AM      Result Value Range   Neutrophils Relative 70  43 - 77 %   Neutro Abs 5.6  1.7 - 7.7 K/uL   Lymphocytes Relative 21  12 - 46 %   Lymphs Abs 1.7  0.7 - 4.0 K/uL   Monocytes Relative 7  3 - 12 %   Monocytes Absolute 0.6  0.1 - 1.0 K/uL   Eosinophils Relative 2  0 - 5 %   Eosinophils Absolute 0.1  0.0 - 0.7 K/uL   Basophils Relative 1  0 - 1 %   Basophils Absolute 0.0  0.0 - 0.1 K/uL  TROPONIN I     Status: None   Collection Time    03/24/13  9:35 AM      Result Value Range   Troponin I <0.30  <0.30 ng/mL   Comment:            Due to the release kinetics of cTnI,     a negative result within the first hours     of the onset of symptoms does not rule out     myocardial infarction with certainty.     If myocardial infarction is still suspected,     repeat the test at appropriate intervals.  POCT I-STAT TROPONIN I     Status: None   Collection Time    03/24/13  9:40 AM       Result Value Range   Troponin i, poc 0.00  0.00 - 0.08 ng/mL   Comment 3            Comment: Due to the release kinetics of cTnI,     a negative result within the first hours     of the onset of symptoms does not rule out     myocardial infarction with certainty.     If myocardial infarction is still suspected,     repeat the test at appropriate intervals.  POCT I-STAT, CHEM 8     Status: Abnormal   Collection Time    03/24/13  9:42 AM      Result Value Range   Sodium 142  135 - 145 mEq/L   Potassium 3.4 (*) 3.5 - 5.1 mEq/L   Chloride 104  96 - 112 mEq/L   BUN 25 (*) 6 - 23 mg/dL   Creatinine, Ser 4.09  0.50 - 1.35 mg/dL   Glucose, Bld 811 (*) 70 - 99 mg/dL   Calcium, Ion 9.14 (*) 1.12 - 1.23 mmol/L   TCO2 26  0 - 100 mmol/L   Hemoglobin 16.0  13.0 - 17.0 g/dL   HCT 78.2  95.6 - 21.3 %    Ct Head Wo Contrast  03/24/2013  *RADIOLOGY REPORT*  Clinical Data:  Code stroke, acute onset right eye visual field loss, difficulty finding words, altered speech patterns, right side weakness, unable to walk, history hyperlipidemia, coronary artery disease, arrhythmia, hypertension, former smoker  CT HEAD WITHOUT CONTRAST  Technique:  Contiguous axial images were obtained from the base of the skull through the vertex without contrast.  Comparison: None  Findings: Normal ventricular morphology. No midline shift. Large area of low attenuation identified at the posterior left parietal lobe, 6.4 x 4.9 cm image 19, question central mass with surrounding vasogenic edema, felt unlikely to represent an infarct. Additional small area of questionable white matter hypoattenuation at left vertex. Remaining brain parenchyma normal in appearance. No intracranial hemorrhage or extra-axial fluid collections. Bones and sinuses unremarkable.  IMPRESSION: 6.4 x 4.9 cm diameter area of low attenuation in left parietal lobe suspicious for  mass with surrounding vasogenic edema, question primary versus metastatic lesion.  Further evaluation by MR imaging of brain with/without contrast recommended.  Critical Value/emergent results were called by telephone at the time of interpretation on 03/24/2013 at 0948 hours to Dr. Roseanne Reno, who verbally acknowledged these results.   Original Report Authenticated By: Ulyses Southward, M.D.     Assessment/Plan: Left parietal contrast-enhancing mass lesion with surrounding vasogenic edema, most likely glioblastoma. New-onset seizure disorder manifested by receptive and expressive aphasia associated with this lesion cannot be ruled out.   Neurosurgery has been consulted. Patient has been given Keppra 1000 mg IV loading dose for probable new-onset seizure activity, and 500 mg of Keppra every 12 hours as recommended for seizure control.  Venetia Maxon M.D. [facility] [phone]  03/24/2013, 10:11 AM

## 2013-03-24 NOTE — Progress Notes (Signed)
Dr Jeral Fruit notified of patient admission

## 2013-03-24 NOTE — ED Notes (Signed)
In MRI with patient. Pt calm and relaxed after ativan

## 2013-03-24 NOTE — Progress Notes (Signed)
Pt had sudden stabbing severe left lower back/flank pain accompanied by vomiting. Dr Jeral Fruit notified. Total 2mg  dilaudid and 4mg  zofran given. Pt pain free and comfortable by 1800

## 2013-03-24 NOTE — ED Notes (Signed)
Spoke with Dr. Cassandria Santee office. Notified no orders have been placed for patient. Needs bed requested for patient. Stating will get in touch with him.

## 2013-03-24 NOTE — Progress Notes (Signed)
UR COMPLETED  

## 2013-03-24 NOTE — ED Notes (Addendum)
Per EMS- [facility]. This morning woke up with headache at 0500. Tried to go to work and was driving but h/a became worse so he came home and his wife took him to UC. Upon arrival at [facility] difficulties with gait. Pt unable to answer basic questions, expressive aphasia. Appears to have periods of normal mentation, being able to follow commands then develops periods of deficits. States vision changes," things are moving". No weakness noted, equal grips. No facial droop noted. BP 138/72, Hr 72, 98%, CBG 160.

## 2013-03-24 NOTE — ED Notes (Signed)
Pt holding head, c/o headache, asking for ginger ale.

## 2013-03-24 NOTE — H&P (Signed)
Martin Martin is an 57 y.o. male.   Chief Complaint: headache HPI: patient complaining of headache, dizziness, difficulties with speech since testerday. Seen in the er and we were called to evaluate. Patient ne  Past Medical History  Diagnosis Date  . Hyperlipidemia   . Right bundle branch block     Normal LV function normal aortic and mitral valve bedside echocardiogram 4 2012  . Tachycardia     Resolved  . Dyslipidemia   . Coronary artery disease (CAD) excluded     Cardiolite study 2006 within normal limits  . Hypertension     Past Surgical History  Procedure Laterality Date  . Left inguinal hernia.  2004    No family history on file. Social History:  reports that he quit smoking about 8 years ago. His smoking use included Cigarettes. He has a 30 pack-year smoking history. He has never used smokeless tobacco. He reports that he does not drink alcohol or use illicit drugs.  Allergies:  Allergies  Allergen Reactions  . Morphine And Related Nausea And Vomiting     (Not in a hospital admission)  Results for orders placed during the hospital encounter of 03/24/13 (from the past 48 hour(s))  ETHANOL     Status: None   Collection Time    03/24/13  9:35 AM      Result Value Range   Alcohol, Ethyl (B) <11  0 - 11 mg/dL   Comment:            LOWEST DETECTABLE LIMIT FOR     SERUM ALCOHOL IS 11 mg/dL     FOR MEDICAL PURPOSES ONLY  PROTIME-INR     Status: None   Collection Time    03/24/13  9:35 AM      Result Value Range   Prothrombin Time 12.5  11.6 - 15.2 seconds   INR 0.94  0.00 - 1.49  APTT     Status: None   Collection Time    03/24/13  9:35 AM      Result Value Range   aPTT 28  24 - 37 seconds  CBC     Status: None   Collection Time    03/24/13  9:35 AM      Result Value Range   WBC 8.0  4.0 - 10.5 K/uL   RBC 4.91  4.22 - 5.81 MIL/uL   Hemoglobin 15.4  13.0 - 17.0 g/dL   HCT 40.9  81.1 - 91.4 %   MCV 88.6  78.0 - 100.0 fL   MCH 31.4  26.0 - 34.0 pg    MCHC 35.4  30.0 - 36.0 g/dL   RDW 78.2  95.6 - 21.3 %   Platelets 218  150 - 400 K/uL  DIFFERENTIAL     Status: None   Collection Time    03/24/13  9:35 AM      Result Value Range   Neutrophils Relative 70  43 - 77 %   Neutro Abs 5.6  1.7 - 7.7 K/uL   Lymphocytes Relative 21  12 - 46 %   Lymphs Abs 1.7  0.7 - 4.0 K/uL   Monocytes Relative 7  3 - 12 %   Monocytes Absolute 0.6  0.1 - 1.0 K/uL   Eosinophils Relative 2  0 - 5 %   Eosinophils Absolute 0.1  0.0 - 0.7 K/uL   Basophils Relative 1  0 - 1 %   Basophils Absolute 0.0  0.0 - 0.1 K/uL  COMPREHENSIVE METABOLIC PANEL     Status: Abnormal   Collection Time    03/24/13  9:35 AM      Result Value Range   Sodium 140  135 - 145 mEq/L   Potassium 3.3 (*) 3.5 - 5.1 mEq/L   Chloride 103  96 - 112 mEq/L   CO2 27  19 - 32 mEq/L   Glucose, Bld 137 (*) 70 - 99 mg/dL   BUN 26 (*) 6 - 23 mg/dL   Creatinine, Ser 1.61  0.50 - 1.35 mg/dL   Calcium 9.1  8.4 - 09.6 mg/dL   Total Protein 7.1  6.0 - 8.3 g/dL   Albumin 4.2  3.5 - 5.2 g/dL   AST 22  0 - 37 U/L   ALT 20  0 - 53 U/L   Alkaline Phosphatase 51  39 - 117 U/L   Total Bilirubin 0.6  0.3 - 1.2 mg/dL   GFR calc non Af Amer >90  >90 mL/min   GFR calc Af Amer >90  >90 mL/min   Comment:            The eGFR has been calculated     using the CKD EPI equation.     This calculation has not been     validated in all clinical     situations.     eGFR's persistently     <90 mL/min signify     possible Chronic Kidney Disease.  TROPONIN I     Status: None   Collection Time    03/24/13  9:35 AM      Result Value Range   Troponin I <0.30  <0.30 ng/mL   Comment:            Due to the release kinetics of cTnI,     a negative result within the first hours     of the onset of symptoms does not rule out     myocardial infarction with certainty.     If myocardial infarction is still suspected,     repeat the test at appropriate intervals.  POCT I-STAT TROPONIN I     Status: None   Collection  Time    03/24/13  9:40 AM      Result Value Range   Troponin i, poc 0.00  0.00 - 0.08 ng/mL   Comment 3            Comment: Due to the release kinetics of cTnI,     a negative result within the first hours     of the onset of symptoms does not rule out     myocardial infarction with certainty.     If myocardial infarction is still suspected,     repeat the test at appropriate intervals.  POCT I-STAT, CHEM 8     Status: Abnormal   Collection Time    03/24/13  9:42 AM      Result Value Range   Sodium 142  135 - 145 mEq/L   Potassium 3.4 (*) 3.5 - 5.1 mEq/L   Chloride 104  96 - 112 mEq/L   BUN 25 (*) 6 - 23 mg/dL   Creatinine, Ser 0.45  0.50 - 1.35 mg/dL   Glucose, Bld 409 (*) 70 - 99 mg/dL   Calcium, Ion 8.11 (*) 1.12 - 1.23 mmol/L   TCO2 26  0 - 100 mmol/L   Hemoglobin 16.0  13.0 - 17.0 g/dL   HCT 91.4  78.2 - 95.6 %  Ct Head Wo Contrast  03/24/2013  *RADIOLOGY REPORT*  Clinical Data:  Code stroke, acute onset right eye visual field loss, difficulty finding words, altered speech patterns, right side weakness, unable to walk, history hyperlipidemia, coronary artery disease, arrhythmia, hypertension, former smoker  CT HEAD WITHOUT CONTRAST  Technique:  Contiguous axial images were obtained from the base of the skull through the vertex without contrast.  Comparison: None  Findings: Normal ventricular morphology. No midline shift. Large area of low attenuation identified at the posterior left parietal lobe, 6.4 x 4.9 cm image 19, question central mass with surrounding vasogenic edema, felt unlikely to represent an infarct. Additional small area of questionable white matter hypoattenuation at left vertex. Remaining brain parenchyma normal in appearance. No intracranial hemorrhage or extra-axial fluid collections. Bones and sinuses unremarkable.  IMPRESSION: 6.4 x 4.9 cm diameter area of low attenuation in left parietal lobe suspicious for mass with surrounding vasogenic edema, question primary  versus metastatic lesion. Further evaluation by MR imaging of brain with/without contrast recommended.  Critical Value/emergent results were called by telephone at the time of interpretation on 03/24/2013 at 0948 hours to Dr. Roseanne Reno, who verbally acknowledged these results.   Original Report Authenticated By: Ulyses Southward, M.D.     Review of Systems  Constitutional: Positive for malaise/fatigue.  Respiratory: Negative.   Cardiovascular: Negative.   Gastrointestinal: Negative.   Musculoskeletal: Negative.   Skin: Negative.   Neurological: Positive for dizziness and headaches.  Endo/Heme/Allergies: Negative.   Psychiatric/Behavioral: Negative.     Blood pressure 140/76, pulse 84, temperature 99.1 F (37.3 C), temperature source Oral, resp. rate 14, SpO2 99.00%. Physical Exam   Assessment/[place]. Nl. Neck, nl. Cv, nl. Lungs, clear. Abdomen, soft. Extremities, nl. NEURO. Oriented x 3 but with paraphasical errors. NormL strength. Sensory and dtr nl. Gait was not tested. Ct head shows a mass in lhe left parietal ares wirth vasogenic edema. Mri brai pending Patient to go to tne neuro icu  Jaqlyn Gruenhagen M 03/24/2013, 10:52 AM

## 2013-03-24 NOTE — ED Notes (Signed)
Pt had episode of vomiting. Given 4 mg zofran per Dr. Roseanne Reno. Pt agitated to commands, sound, light.

## 2013-03-24 NOTE — ED Notes (Signed)
Code stroke called [phone]. Pt arrival 0924, EDP exam [phone], Stroke team arrival [phone], Arrival in CT 0930, phlebotomist at 0900. Code stroke cancelled at 0935.

## 2013-03-24 NOTE — ED Notes (Signed)
Pt had episode of vomiting x 1. 

## 2013-03-24 NOTE — ED Provider Notes (Signed)
History    CSN: [id number] Arrival date & time 03/24/13  4098 First MD Initiated Contact with Patient 03/24/13 [phone]      Chief Complaint  Patient presents with  . Code Stroke    HPI  The patient presented to the emergency room as a code stroke this morning. Patient woke up this morning with a headache at about 5 AM. He was driving to work but his headache became more severe. He went home and his wife took him to an urgent care. At that facility they noted that he had difficulty answering basic questions. Is also having trouble with his gait. He symptoms regarding confusion in his speech seemed to be waxing and waning somewhat. Patient also mentioned that he was having difficulties with his vision. Patient was then transported by ambulance to the emergency room for further evaluation. Patient was evaluated by the stroke team on arrival.  Past Medical History  Diagnosis Date  . Hyperlipidemia   . Right bundle branch block     Normal LV function normal aortic and mitral valve bedside echocardiogram 4 2012  . Tachycardia     Resolved  . Dyslipidemia   . Coronary artery disease (CAD) excluded     Cardiolite study 2006 within normal limits  . Hypertension     Past Surgical History  Procedure Laterality Date  . Left inguinal hernia.  2004    No family history on file.  History  Substance Use Topics  . Smoking status: Former Smoker -- 1.00 packs/day for 30 years    Types: Cigarettes    Quit date: 03/21/2005  . Smokeless tobacco: Never Used  . Alcohol Use: No      Review of Systems  All other systems reviewed and are negative.    Allergies  Morphine and related  Home Medications   Current Outpatient Rx  Name  Route  Sig  Dispense  Refill  . diltiazem (CARDIZEM CD) 120 MG 24 hr capsule   Oral   Take 1 capsule (120 mg total) by mouth daily.   90 capsule   3   . metoprolol tartrate (LOPRESSOR) 25 MG tablet   Oral   Take 25 mg by mouth 2 (two) times daily.            BP 140/76  Pulse 84  Temp(Src) 99.1 F (37.3 C) (Oral)  Resp 14  SpO2 99%  Physical Exam  Nursing note and vitals reviewed. Constitutional: He appears well-developed and well-nourished. No distress.  HENT:  Head: Normocephalic and atraumatic.  Right Ear: External ear normal.  Left Ear: External ear normal.  Eyes: Conjunctivae are normal. Right eye exhibits no discharge. Left eye exhibits no discharge. No scleral icterus.  Neck: Neck supple. No tracheal deviation present.  Cardiovascular: Normal rate, regular rhythm and intact distal pulses.   Pulmonary/Chest: Effort normal and breath sounds normal. No stridor. No respiratory distress. He has no wheezes. He has no rales.  Abdominal: Soft. Bowel sounds are normal. He exhibits no distension. There is no tenderness. There is no rebound and no guarding.  Musculoskeletal: He exhibits no edema and no tenderness.  Neurological: He is alert. He has normal strength. He is disoriented. He displays tremor. A cranial nerve deficit ( no gross defecits noted) is present. No sensory deficit. He exhibits normal muscle tone. He displays no seizure activity. Coordination abnormal. GCS eye subscore is 4. GCS verbal subscore is 4. GCS motor subscore is 6.  Tremor noted of the right upper extremity  and right lower extremity, possible visual field deficit in the right field of vision, aphasia noted, patient has difficulty following commands  Skin: Skin is warm and dry. No rash noted.  Psychiatric: He has a normal mood and affect.    ED Course  Procedures (including critical care time) EKG Normal sinus rhythm Left atrial abnormality Incomplete right bundle branch block No significant change when compared to prior EKG dated 15March 2013  Medications  levETIRAcetam (KEPPRA) 1,000 mg in sodium chloride 0.9 % 100 mL IVPB (1,000 mg Intravenous New Bag/Given 03/24/13 1014)  HYDROmorphone (DILAUDID) injection 2 mg (2 mg Intravenous Given 03/24/13 1008)   ondansetron (ZOFRAN) injection 4 mg (4 mg Intravenous Given 03/24/13 1027)  Pt developed nausea.  Zofran IV ordered.  Labs Reviewed  COMPREHENSIVE METABOLIC PANEL - Abnormal; Notable for the following:    Potassium 3.3 (*)    Glucose, Bld 137 (*)    BUN 26 (*)    All other components within normal limits  POCT I-STAT, CHEM 8 - Abnormal; Notable for the following:    Potassium 3.4 (*)    BUN 25 (*)    Glucose, Bld 138 (*)    Calcium, Ion 1.10 (*)    All other components within normal limits  ETHANOL  PROTIME-INR  APTT  CBC  DIFFERENTIAL  TROPONIN I  URINE RAPID DRUG SCREEN (HOSP PERFORMED)  URINALYSIS, ROUTINE W REFLEX MICROSCOPIC  POCT I-STAT TROPONIN I   Ct Head Wo Contrast  03/24/2013  *RADIOLOGY REPORT*  Clinical Data:  Code stroke, acute onset right eye visual field loss, difficulty finding words, altered speech patterns, right side weakness, unable to walk, history hyperlipidemia, coronary artery disease, arrhythmia, hypertension, former smoker  CT HEAD WITHOUT CONTRAST  Technique:  Contiguous axial images were obtained from the base of the skull through the vertex without contrast.  Comparison: None  Findings: Normal ventricular morphology. No midline shift. Large area of low attenuation identified at the posterior left parietal lobe, 6.4 x 4.9 cm image 19, question central mass with surrounding vasogenic edema, felt unlikely to represent an infarct. Additional small area of questionable white matter hypoattenuation at left vertex. Remaining brain parenchyma normal in appearance. No intracranial hemorrhage or extra-axial fluid collections. Bones and sinuses unremarkable.  IMPRESSION: 6.4 x 4.9 cm diameter area of low attenuation in left parietal lobe suspicious for mass with surrounding vasogenic edema, question primary versus metastatic lesion. Further evaluation by MR imaging of brain with/without contrast recommended.  Critical Value/emergent results were called by telephone at the  time of interpretation on 03/24/2013 at 0948 hours to Dr. Roseanne Reno, who verbally acknowledged these results.   Original Report Authenticated By: Ulyses Southward, M.D.      1. Brain tumor       MDM  Patient was evaluated by the stroke team. Preliminary review of the CT scan was most consistent with a brain tumor.  Dr. Jeral Fruit has evaluated the patient in emergent apartment. He will be admitted to the neurosurgical ICU for further treatment        Celene Kras, MD 03/24/13 1053

## 2013-03-24 NOTE — ED Notes (Signed)
Pt attempting to use urinal with wife's assistance. Pt follows commands, pt agitated with direction.

## 2013-03-25 ENCOUNTER — Inpatient Hospital Stay (HOSPITAL_COMMUNITY): Payer: BC Managed Care – PPO

## 2013-03-25 MED ORDER — IOHEXOL 300 MG/ML  SOLN
80.0000 mL | Freq: Once | INTRAMUSCULAR | Status: AC | PRN
  Administered 2013-03-25: 80 mL via INTRAVENOUS

## 2013-03-25 MED ORDER — ALPRAZOLAM ER 0.5 MG PO TB24: 0.5000 mg | ORAL_TABLET | Freq: Every day | ORAL | Status: DC

## 2013-03-25 MED ORDER — ALPRAZOLAM 0.25 MG PO TABS
0.2500 mg | ORAL_TABLET | Freq: Four times a day (QID) | ORAL | Status: DC | PRN
  Administered 2013-03-25: 0.25 mg via ORAL
  Filled 2013-03-25: qty 1

## 2013-03-25 NOTE — Progress Notes (Signed)
Sinus bradycardia; HR 40s, asymptomatic;   Metoprolol held in am, however the patient received diltiazem and metoprolol this am; Calcium low yesterday;   Repeat electrolytes; troponin ordered once. Agree with holding metoprolol and diltiazem;

## 2013-03-25 NOTE — Progress Notes (Signed)
Patient ID: Martin Martin, male   DOB: 29-Sep-1956, 57 y.o.   MRN: [id number] C/o kidney stone pain. Crying because of the brain tumor findings. Nauseated. No weakness. Ct chest today

## 2013-03-26 LAB — BASIC METABOLIC PANEL
CO2: 26 mEq/L (ref 19–32)
Calcium: 8.6 mg/dL (ref 8.4–10.5)
GFR calc non Af Amer: 42 mL/min — ABNORMAL LOW (ref >90)
Potassium: 4.8 mEq/L (ref 3.5–5.1)
Sodium: 137 mEq/L (ref 135–145)

## 2013-03-26 LAB — MAGNESIUM: Magnesium: 2.3 mg/dL (ref 1.5–2.5)

## 2013-03-26 LAB — TROPONIN I: Troponin I: <0.3 ng/mL (ref <0.30)

## 2013-03-26 LAB — CALCIUM, IONIZED: Calcium, Ion: 1.17 mmol/L (ref 1.12–1.23)

## 2013-03-26 MED ORDER — LEVETIRACETAM 500 MG PO TABS
500.0000 mg | ORAL_TABLET | Freq: Two times a day (BID) | ORAL | Status: DC
  Administered 2013-03-26 – 2013-03-28 (×6): 500 mg via ORAL
  Filled 2013-03-26 (×8): qty 1

## 2013-03-26 NOTE — Progress Notes (Signed)
Notified CCM the EKG monitor is not picking up the correct heart rate. Monitor reads in 30s and 40s but heart rate is >60 . Patient is asymptomatic, resting comfortably, arouses to voice and follows commands. Will continue to monitor closely.

## 2013-03-26 NOTE — Progress Notes (Signed)
Patient ID: Martin Martin, male   DOB: 24-Aug-1956, 57 y.o.   MRN: [id number] More awake, less headache. Aware of chest ct negative,. Stone in left kidney

## 2013-03-26 NOTE — Progress Notes (Signed)
Spoke with him and wife, less headache, more inclined to go ahead with surgery

## 2013-03-27 MED ORDER — DEXAMETHASONE 4 MG PO TABS
4.0000 mg | ORAL_TABLET | Freq: Four times a day (QID) | ORAL | Status: DC
  Administered 2013-03-27 – 2013-03-28 (×7): 4 mg via ORAL
  Filled 2013-03-27 (×9): qty 1

## 2013-03-28 ENCOUNTER — Inpatient Hospital Stay (HOSPITAL_COMMUNITY): Payer: BC Managed Care – PPO

## 2013-03-28 ENCOUNTER — Encounter (HOSPITAL_COMMUNITY): Payer: Self-pay | Admitting: Radiology

## 2013-03-28 MED ORDER — IOHEXOL 300 MG/ML  SOLN
60.0000 mL | Freq: Once | INTRAMUSCULAR | Status: AC | PRN
  Administered 2013-03-28: 60 mL via INTRAVENOUS

## 2013-03-28 NOTE — Progress Notes (Signed)
Patient ID: Martin Martin, male   DOB: 10/31/1956, 57 y.o.   MRN: [id number] Stable, back to normal mentally. Spoke with him and wife , they wany to peoceed with surgery in am will get a ct head with the stealth protocol. Aware of risks such as paralysis, infection, unable to remove the tumor to or 03/29/13

## 2013-03-29 ENCOUNTER — Encounter (HOSPITAL_COMMUNITY): Payer: Self-pay | Admitting: Anesthesiology

## 2013-03-29 ENCOUNTER — Encounter (HOSPITAL_COMMUNITY)
Admission: EM | Disposition: A | Discharge status: Home / Self Care | Payer: Self-pay | Source: Home / Self Care | Attending: Neurosurgery

## 2013-03-29 ENCOUNTER — Encounter (HOSPITAL_COMMUNITY): Payer: Self-pay | Admitting: Certified Registered Nurse Anesthetist

## 2013-03-29 ENCOUNTER — Inpatient Hospital Stay (HOSPITAL_COMMUNITY): Payer: BC Managed Care – PPO | Admitting: Anesthesiology

## 2013-03-29 HISTORY — PX: CRANIOTOMY: SHX93

## 2013-03-29 LAB — COMPREHENSIVE METABOLIC PANEL
AST: 19 U/L (ref 0–37)
BUN: 24 mg/dL — ABNORMAL HIGH (ref 6–23)
CO2: 28 mEq/L (ref 19–32)
Calcium: 8.8 mg/dL (ref 8.4–10.5)
Chloride: 98 mEq/L (ref 96–112)
Creatinine, Ser: 0.83 mg/dL (ref 0.50–1.35)
GFR calc Af Amer: >90 mL/min (ref >90)
GFR calc non Af Amer: >90 mL/min (ref >90)
Glucose, Bld: 131 mg/dL — ABNORMAL HIGH (ref 70–99)
Total Bilirubin: 0.5 mg/dL (ref 0.3–1.2)

## 2013-03-29 LAB — CBC
HCT: 42.4 % (ref 39.0–52.0)
Hemoglobin: 15.1 g/dL (ref 13.0–17.0)
MCH: 31.1 pg (ref 26.0–34.0)
MCV: 87.4 fL (ref 78.0–100.0)
Platelets: 186 10*3/uL (ref 150–400)
RBC: 4.85 MIL/uL (ref 4.22–5.81)
WBC: 10.1 10*3/uL (ref 4.0–10.5)

## 2013-03-29 SURGERY — CRANIOTOMY TUMOR EXCISION
Anesthesia: General | Laterality: Left | Wound class: Clean

## 2013-03-29 MED ORDER — ONDANSETRON HCL 4 MG/2ML IJ SOLN
4.0000 mg | INTRAMUSCULAR | Status: DC | PRN
  Administered 2013-03-29: 4 mg via INTRAVENOUS
  Filled 2013-03-29: qty 2

## 2013-03-29 MED ORDER — HEMOSTATIC AGENTS (NO CHARGE) OPTIME
TOPICAL | Status: DC | PRN
  Administered 2013-03-29: 1 via TOPICAL

## 2013-03-29 MED ORDER — FENTANYL CITRATE 0.05 MG/ML IJ SOLN
INTRAMUSCULAR | Status: AC
  Filled 2013-03-29: qty 2

## 2013-03-29 MED ORDER — OXYCODONE-ACETAMINOPHEN 5-325 MG PO TABS
1.0000 | ORAL_TABLET | ORAL | Status: DC | PRN
Start: 2013-03-29 — End: 2013-04-02
  Administered 2013-03-30 – 2013-04-02 (×9): 1 via ORAL
  Filled 2013-03-29 (×9): qty 1

## 2013-03-29 MED ORDER — DIPHENHYDRAMINE HCL 12.5 MG/5ML PO ELIX
12.5000 mg | ORAL_SOLUTION | Freq: Four times a day (QID) | ORAL | Status: DC | PRN
  Filled 2013-03-29: qty 5

## 2013-03-29 MED ORDER — SODIUM CHLORIDE 0.9 % IV SOLN
1000.0000 mg | Freq: Once | INTRAVENOUS | Status: AC
  Administered 2013-03-29: 1000 mg via INTRAVENOUS
  Filled 2013-03-29: qty 10

## 2013-03-29 MED ORDER — THROMBIN 20000 UNITS EX SOLR
CUTANEOUS | Status: DC | PRN
  Administered 2013-03-29: 12:00:00 via TOPICAL

## 2013-03-29 MED ORDER — VECURONIUM BROMIDE 10 MG IV SOLR
INTRAVENOUS | Status: DC | PRN
  Administered 2013-03-29 (×2): 2 mg via INTRAVENOUS
  Administered 2013-03-29: 1 mg via INTRAVENOUS
  Administered 2013-03-29: 2 mg via INTRAVENOUS

## 2013-03-29 MED ORDER — LIDOCAINE HCL (CARDIAC) 20 MG/ML IV SOLN
INTRAVENOUS | Status: DC | PRN
  Administered 2013-03-29: 40 mg via INTRAVENOUS

## 2013-03-29 MED ORDER — SODIUM CHLORIDE 0.9 % IV SOLN
500.0000 mg | Freq: Two times a day (BID) | INTRAVENOUS | Status: DC
  Administered 2013-03-29: 500 mg via INTRAVENOUS
  Filled 2013-03-29 (×3): qty 5

## 2013-03-29 MED ORDER — MICROFIBRILLAR COLL HEMOSTAT EX PADS
MEDICATED_PAD | CUTANEOUS | Status: DC | PRN
  Administered 2013-03-29: 1 via TOPICAL

## 2013-03-29 MED ORDER — HYDROMORPHONE HCL PF 1 MG/ML IJ SOLN
INTRAMUSCULAR | Status: AC
  Administered 2013-03-29: 1 mg via INTRAVENOUS
  Filled 2013-03-29: qty 1

## 2013-03-29 MED ORDER — LABETALOL HCL 5 MG/ML IV SOLN: 10.0000 mg | INTRAVENOUS | Status: DC | PRN

## 2013-03-29 MED ORDER — PROMETHAZINE HCL 25 MG PO TABS: 12.5000 mg | ORAL_TABLET | ORAL | Status: DC | PRN

## 2013-03-29 MED ORDER — HYDROMORPHONE HCL PF 1 MG/ML IJ SOLN: 1.0000 mg | INTRAMUSCULAR | Status: DC | PRN

## 2013-03-29 MED ORDER — SODIUM CHLORIDE 0.9 % IV SOLN
INTRAVENOUS | Status: DC
  Administered 2013-03-29 – 2013-03-30 (×2): via INTRAVENOUS
  Administered 2013-03-30: 100 mL/h via INTRAVENOUS

## 2013-03-29 MED ORDER — EPHEDRINE SULFATE 50 MG/ML IJ SOLN
INTRAMUSCULAR | Status: DC | PRN
  Administered 2013-03-29: 2.5 mg via INTRAVENOUS
  Administered 2013-03-29: 5 mg via INTRAVENOUS

## 2013-03-29 MED ORDER — POVIDONE-IODINE 10 % EX OINT
TOPICAL_OINTMENT | CUTANEOUS | Status: DC | PRN
  Administered 2013-03-29: 1 via TOPICAL

## 2013-03-29 MED ORDER — FENTANYL 10 MCG/ML IV SOLN
INTRAVENOUS | Status: DC
  Administered 2013-03-29: 18:00:00 via INTRAVENOUS
  Administered 2013-03-30 (×2): 20 ug via INTRAVENOUS
  Administered 2013-03-30: 50 ug/h via INTRAVENOUS
  Administered 2013-03-30: 20 ug via INTRAVENOUS
  Filled 2013-03-29: qty 50

## 2013-03-29 MED ORDER — CEFAZOLIN SODIUM-DEXTROSE 2-3 GM-% IV SOLR
INTRAVENOUS | Status: AC
  Administered 2013-03-29: 2 g via INTRAVENOUS
  Filled 2013-03-29: qty 50

## 2013-03-29 MED ORDER — DIPHENHYDRAMINE HCL 50 MG/ML IJ SOLN: 12.5000 mg | Freq: Four times a day (QID) | INTRAMUSCULAR | Status: DC | PRN

## 2013-03-29 MED ORDER — SODIUM CHLORIDE 0.9 % IJ SOLN: 9.0000 mL | INTRAMUSCULAR | Status: DC | PRN

## 2013-03-29 MED ORDER — BUPIVACAINE-EPINEPHRINE 0.5% -1:200000 IJ SOLN
INTRAMUSCULAR | Status: DC | PRN
  Administered 2013-03-29: 10 mL

## 2013-03-29 MED ORDER — ONDANSETRON HCL 4 MG PO TABS: 4.0000 mg | ORAL_TABLET | ORAL | Status: DC | PRN

## 2013-03-29 MED ORDER — ONDANSETRON HCL 4 MG/2ML IJ SOLN: 4.0000 mg | Freq: Four times a day (QID) | INTRAMUSCULAR | Status: DC | PRN

## 2013-03-29 MED ORDER — 0.9 % SODIUM CHLORIDE (POUR BTL) OPTIME
TOPICAL | Status: DC | PRN
  Administered 2013-03-29: 1000 mL

## 2013-03-29 MED ORDER — PROPOFOL 10 MG/ML IV BOLUS
INTRAVENOUS | Status: DC | PRN
  Administered 2013-03-29: 200 mg via INTRAVENOUS

## 2013-03-29 MED ORDER — PANTOPRAZOLE SODIUM 40 MG IV SOLR
40.0000 mg | Freq: Every day | INTRAVENOUS | Status: DC
  Administered 2013-03-29: 40 mg via INTRAVENOUS
  Filled 2013-03-29 (×2): qty 40

## 2013-03-29 MED ORDER — ONDANSETRON HCL 4 MG/2ML IJ SOLN
INTRAMUSCULAR | Status: DC | PRN
  Administered 2013-03-29: 4 mg via INTRAVENOUS

## 2013-03-29 MED ORDER — ROCURONIUM BROMIDE 100 MG/10ML IV SOLN
INTRAVENOUS | Status: DC | PRN
  Administered 2013-03-29: 50 mg via INTRAVENOUS

## 2013-03-29 MED ORDER — SODIUM CHLORIDE 0.9 % IV SOLN
INTRAVENOUS | Status: DC | PRN
  Administered 2013-03-29 (×3): via INTRAVENOUS

## 2013-03-29 MED ORDER — FENTANYL CITRATE 0.05 MG/ML IJ SOLN
INTRAMUSCULAR | Status: DC | PRN
  Administered 2013-03-29: 50 ug via INTRAVENOUS
  Administered 2013-03-29: 100 ug via INTRAVENOUS

## 2013-03-29 MED ORDER — LABETALOL HCL 5 MG/ML IV SOLN
INTRAVENOUS | Status: DC | PRN
  Administered 2013-03-29: 5 mg via INTRAVENOUS
  Administered 2013-03-29: 10 mg via INTRAVENOUS
  Administered 2013-03-29: 5 mg via INTRAVENOUS

## 2013-03-29 MED ORDER — METOCLOPRAMIDE HCL 5 MG/ML IJ SOLN
10.0000 mg | Freq: Once | INTRAMUSCULAR | Status: AC | PRN
  Filled 2013-03-29: qty 2

## 2013-03-29 MED ORDER — NEOSTIGMINE METHYLSULFATE 1 MG/ML IJ SOLN
INTRAMUSCULAR | Status: DC | PRN
  Administered 2013-03-29: 4 mg via INTRAVENOUS

## 2013-03-29 MED ORDER — NALOXONE HCL 0.4 MG/ML IJ SOLN: 0.4000 mg | INTRAMUSCULAR | Status: DC | PRN

## 2013-03-29 MED ORDER — GLYCOPYRROLATE 0.2 MG/ML IJ SOLN
INTRAMUSCULAR | Status: DC | PRN
  Administered 2013-03-29: .6 mg via INTRAVENOUS

## 2013-03-29 MED ORDER — FENTANYL CITRATE 0.05 MG/ML IJ SOLN
25.0000 ug | INTRAMUSCULAR | Status: DC | PRN
  Administered 2013-03-29: 50 ug via INTRAVENOUS
  Administered 2013-03-29 (×2): 25 ug via INTRAVENOUS

## 2013-03-29 MED ORDER — ARTIFICIAL TEARS OP OINT
TOPICAL_OINTMENT | OPHTHALMIC | Status: DC | PRN
  Administered 2013-03-29: 1 via OPHTHALMIC

## 2013-03-29 MED ORDER — MIDAZOLAM HCL 5 MG/5ML IJ SOLN
INTRAMUSCULAR | Status: DC | PRN
  Administered 2013-03-29: 2 mg via INTRAVENOUS

## 2013-03-29 MED ORDER — DEXAMETHASONE SODIUM PHOSPHATE 4 MG/ML IJ SOLN
4.0000 mg | Freq: Four times a day (QID) | INTRAMUSCULAR | Status: DC
  Administered 2013-03-29: 4 mg via INTRAVENOUS
  Administered 2013-03-29: 10 mg via INTRAVENOUS
  Administered 2013-03-29 – 2013-03-30 (×4): 4 mg via INTRAVENOUS
  Filled 2013-03-29 (×12): qty 1

## 2013-03-29 SURGICAL SUPPLY — 85 items
APL SKNCLS STERI-STRIP NONHPOA (GAUZE/BANDAGES/DRESSINGS) ×1
BANDAGE GAUZE ELAST BULKY 4 IN (GAUZE/BANDAGES/DRESSINGS) IMPLANT
BENZOIN TINCTURE PRP APPL 2/3 (GAUZE/BANDAGES/DRESSINGS) ×2 IMPLANT
BIT DRILL WIRE PASS 1.3MM (BIT) IMPLANT
BLADE ULTRA TIP 2M (BLADE) ×2 IMPLANT
BRUSH SCRUB EZ 1% IODOPHOR (MISCELLANEOUS) ×2 IMPLANT
BUR ACORN 6.0 PRECISION (BURR) ×2 IMPLANT
BUR ADDG 1.1 (BURR) IMPLANT
BUR MATCHSTICK NEURO 3.0 LAGG (BURR) IMPLANT
BUR ROUTER D-58 CRANI (BURR) IMPLANT
CANISTER SUCTION 2500CC (MISCELLANEOUS) ×2 IMPLANT
CLIP TI MEDIUM 6 (CLIP) IMPLANT
CLOTH BEACON ORANGE TIMEOUT ST (SAFETY) ×2 IMPLANT
CONT SPEC 4OZ CLIKSEAL STRL BL (MISCELLANEOUS) ×5 IMPLANT
CORDS BIPOLAR (ELECTRODE) ×2 IMPLANT
COVER TABLE BACK 60X90 (DRAPES) ×2 IMPLANT
DECANTER SPIKE VIAL GLASS SM (MISCELLANEOUS) ×2 IMPLANT
DRAIN SNY WOU 7FLT (WOUND CARE) IMPLANT
DRAIN SUBARACHNOID (WOUND CARE) IMPLANT
DRAPE C-ARM 42X72 X-RAY (DRAPES) ×1 IMPLANT
DRAPE CAMERA VIDEO/LASER (DRAPES) IMPLANT
DRAPE LONG LASER MIC (DRAPES) IMPLANT
DRAPE MICROSCOPE LEICA (MISCELLANEOUS) ×1 IMPLANT
DRAPE NEUROLOGICAL W/INCISE (DRAPES) ×2 IMPLANT
DRAPE SURG IRRIG POUCH 19X23 (DRAPES) IMPLANT
DRAPE WARM FLUID 44X44 (DRAPE) ×2 IMPLANT
DRILL WIRE PASS 1.3MM (BIT)
DURAFORM SPONGE 2X2 SINGLE (Neuro Prosthesis/Implant) ×1 IMPLANT
DURAPREP 6ML APPLICATOR 50/CS (WOUND CARE) IMPLANT
ELECT CAUTERY BLADE 6.4 (BLADE) ×2 IMPLANT
ELECT REM PT RETURN 9FT ADLT (ELECTROSURGICAL) ×2
ELECTRODE REM PT RTRN 9FT ADLT (ELECTROSURGICAL) ×1 IMPLANT
EVACUATOR 1/8 PVC DRAIN (DRAIN) IMPLANT
EVACUATOR SILICONE 100CC (DRAIN) IMPLANT
FORCEPS BIPOLAR SPETZLER 8 1.0 (NEUROSURGERY SUPPLIES) ×1 IMPLANT
GAUZE SPONGE 4X4 16PLY XRAY LF (GAUZE/BANDAGES/DRESSINGS) IMPLANT
GLOVE BIOGEL M 8.0 STRL (GLOVE) ×2 IMPLANT
GLOVE ECLIPSE 6.5 STRL STRAW (GLOVE) ×1 IMPLANT
GLOVE ECLIPSE 7.5 STRL STRAW (GLOVE) ×5 IMPLANT
GLOVE EXAM NITRILE LRG STRL (GLOVE) IMPLANT
GLOVE EXAM NITRILE MD LF STRL (GLOVE) ×1 IMPLANT
GLOVE EXAM NITRILE XL STR (GLOVE) IMPLANT
GLOVE EXAM NITRILE XS STR PU (GLOVE) IMPLANT
GLOVE INDICATOR 7.5 STRL GRN (GLOVE) ×1 IMPLANT
GLOVE INDICATOR 8.0 STRL GRN (GLOVE) ×1 IMPLANT
GOWN BRE IMP SLV AUR LG STRL (GOWN DISPOSABLE) ×2 IMPLANT
GOWN BRE IMP SLV AUR XL STRL (GOWN DISPOSABLE) ×1 IMPLANT
GOWN STRL REIN 2XL LVL4 (GOWN DISPOSABLE) IMPLANT
HEMOSTAT SURGICEL 2X14 (HEMOSTASIS) IMPLANT
HOOK DURA (MISCELLANEOUS) ×2 IMPLANT
KIT BASIN OR (CUSTOM PROCEDURE TRAY) ×2 IMPLANT
KIT ROOM TURNOVER OR (KITS) ×2 IMPLANT
MARKER SPHERE PSV REFLC NDI (MISCELLANEOUS) ×2 IMPLANT
NDL HYPO 25X1 1.5 SAFETY (NEEDLE) ×1 IMPLANT
NEEDLE HYPO 25X1 1.5 SAFETY (NEEDLE) ×2 IMPLANT
NS IRRIG 1000ML POUR BTL (IV SOLUTION) ×3 IMPLANT
PACK CRANIOTOMY (CUSTOM PROCEDURE TRAY) ×2 IMPLANT
PAD EYE OVAL STERILE LF (GAUZE/BANDAGES/DRESSINGS) IMPLANT
PATTIES SURGICAL .25X.25 (GAUZE/BANDAGES/DRESSINGS) IMPLANT
PATTIES SURGICAL .5 X.5 (GAUZE/BANDAGES/DRESSINGS) IMPLANT
PATTIES SURGICAL .5 X3 (DISPOSABLE) IMPLANT
PATTIES SURGICAL 1/4 X 3 (GAUZE/BANDAGES/DRESSINGS) IMPLANT
PATTIES SURGICAL 1X1 (DISPOSABLE) IMPLANT
PLATE 1.5  2HOLE MED NEURO (Plate) ×3 IMPLANT
PLATE 1.5 2HOLE MED NEURO (Plate) IMPLANT
PLATE 1.5/0.5 13MM BURR HOLE (Plate) ×1 IMPLANT
RUBBERBAND STERILE (MISCELLANEOUS) IMPLANT
SCREW SELF DRILL HT 1.5/4MM (Screw) ×10 IMPLANT
SPONGE GAUZE 4X4 12PLY (GAUZE/BANDAGES/DRESSINGS) ×2 IMPLANT
SPONGE NEURO XRAY DETECT 1X3 (DISPOSABLE) IMPLANT
SPONGE SURGIFOAM ABS GEL 100 (HEMOSTASIS) IMPLANT
STAPLER SKIN PROX WIDE 3.9 (STAPLE) ×2 IMPLANT
STRIP CLOSURE SKIN 1/2X4 (GAUZE/BANDAGES/DRESSINGS) ×2 IMPLANT
SUT NURALON 4 0 TR CR/8 (SUTURE) ×7 IMPLANT
SUT SILK 0 TIES 10X30 (SUTURE) IMPLANT
SUT VIC AB 2-0 CP2 18 (SUTURE) ×2 IMPLANT
SYR 20ML ECCENTRIC (SYRINGE) ×2 IMPLANT
TIP SONASTAR STD MISONIX 1.9 (TRAY / TRAY PROCEDURE) IMPLANT
TOWEL OR 17X24 6PK STRL BLUE (TOWEL DISPOSABLE) ×2 IMPLANT
TOWEL OR 17X26 10 PK STRL BLUE (TOWEL DISPOSABLE) ×2 IMPLANT
TRAY FOLEY CATH 14FRSI W/METER (CATHETERS) ×2 IMPLANT
TRAY FOLEY IC TEMP SENS 16FR (CATHETERS) ×1 IMPLANT
TUBE CONNECTING 12X1/4 (SUCTIONS) ×2 IMPLANT
UNDERPAD 30X30 INCONTINENT (UNDERPADS AND DIAPERS) ×1 IMPLANT
WATER STERILE IRR 1000ML POUR (IV SOLUTION) ×2 IMPLANT

## 2013-03-29 NOTE — Preoperative (Signed)
Beta Blockers   Reason not to administer Beta Blockers:Not Applicable, took metoprolol 04/08 2134 per MAR.

## 2013-03-29 NOTE — Anesthesia Preprocedure Evaluation (Signed)
Anesthesia Evaluation  Patient identified by MRN, date of birth, ID band Patient awake    Reviewed: Allergy & Precautions, H&P , NPO status , Patient's Chart, lab work & pertinent test results, reviewed documented beta blocker date and time   Airway Mallampati: II TM Distance: >3 FB Neck ROM: full    Dental   Pulmonary neg pulmonary ROS,  breath sounds clear to auscultation        Cardiovascular hypertension, On Medications + dysrhythmias Rhythm:regular     Neuro/Psych  Headaches, Seizures -,  negative psych ROS   GI/Hepatic negative GI ROS, Neg liver ROS,   Endo/Other  negative endocrine ROS  Renal/GU negative Renal ROS  negative genitourinary   Musculoskeletal   Abdominal   Peds  Hematology negative hematology ROS (+)   Anesthesia Other Findings See surgeon's H&P   Reproductive/Obstetrics negative OB ROS                           Anesthesia Physical Anesthesia Plan  ASA: III  Anesthesia Plan: General   Post-op Pain Management:    Induction: Intravenous  Airway Management Planned: Oral ETT  Additional Equipment: Arterial line  Intra-op Plan:   Post-operative Plan: Possible Post-op intubation/ventilation  Informed Consent: I have reviewed the patients History and Physical, chart, labs and discussed the procedure including the risks, benefits and alternatives for the proposed anesthesia with the patient or authorized representative who has indicated his/her understanding and acceptance.   Dental Advisory Given  Plan Discussed with: CRNA and Surgeon  Anesthesia Plan Comments:         Anesthesia Quick Evaluation

## 2013-03-29 NOTE — Transfer of Care (Signed)
Immediate Anesthesia Transfer of Care Note  Patient: Martin Martin  Procedure(s) Performed: Procedure(s) with comments: CRANIOTOMY TUMOR EXCISION (Left) - Left Craniotomy for tumor excision  Patient Location: PACU  Anesthesia Type:General  Level of Consciousness: awake, alert , oriented and patient cooperative  Airway & Oxygen Therapy: Patient Spontanous Breathing and Patient connected to nasal cannula oxygen  Post-op Assessment: Report given to PACU RN, Post -op Vital signs reviewed and stable, Patient moving all extremities X 4 and Patient able to stick tongue midline  Post vital signs: Reviewed and stable  Complications: No apparent anesthesia complications

## 2013-03-29 NOTE — Op Note (Signed)
Op note [phone]

## 2013-03-29 NOTE — Anesthesia Postprocedure Evaluation (Signed)
Anesthesia Post Note  Patient: Martin Martin  Procedure(s) Performed: Procedure(s) (LRB): CRANIOTOMY TUMOR EXCISION (Left)  Anesthesia type: general  Patient location: PACU  Post pain: Pain level controlled  Post assessment: Patient's Cardiovascular Status Stable  Last Vitals:  Filed Vitals:   03/29/13 1530  BP: 145/87  Pulse:   Temp:   Resp:     Post vital signs: Reviewed and stable  Level of consciousness: sedated  Complications: No apparent anesthesia complications

## 2013-03-29 NOTE — Progress Notes (Signed)
Nursing 1630 Dr. Jeral Fruit notified that patient is having a headache and does not have any IV pain medications ordered.  MD aware of Morphine allergy.  Order for Dilaudid IV, RN verified with pharmacist due to patient's allergy to Morphine.  RN gave patient Dilaudid 1mg  IV for pain and patient proceeded to become nauseous and vomit small amount of clear liquid.  RN gave Zofran 4mg  IV per order.  Patient reports improvement in nausea after medication given.  Dr. Jeral Fruit notified of patient's intolerance to medication, order for Fentanyl PCA given, will continue to assess.

## 2013-03-29 NOTE — Anesthesia Procedure Notes (Signed)
Procedure Name: Intubation Date/Time: 03/29/2013 11:45 AM Performed by: Angelica Pou Pre-anesthesia Checklist: Patient identified, Timeout performed, Emergency Drugs available, Suction available and Patient being monitored Patient Re-evaluated:Patient Re-evaluated prior to inductionOxygen Delivery Method: Circle system utilized Preoxygenation: Pre-oxygenation with 100% oxygen Intubation Type: IV induction Ventilation: Mask ventilation without difficulty Laryngoscope Size: Mac and 4 Grade View: Grade I Tube type: Oral Tube size: 7.5 mm Number of attempts: 1 Airway Equipment and Method: Stylet Placement Confirmation: ETT inserted through vocal cords under direct vision,  breath sounds checked- equal and bilateral and positive ETCO2 Secured at: 23 cm Tube secured with: Tape Dental Injury: Teeth and Oropharynx as per pre-operative assessment

## 2013-03-30 ENCOUNTER — Encounter (HOSPITAL_COMMUNITY): Payer: Self-pay | Admitting: Neurosurgery

## 2013-03-30 LAB — CBC
HCT: 38.9 % — ABNORMAL LOW (ref 39.0–52.0)
Hemoglobin: 13.7 g/dL (ref 13.0–17.0)
MCV: 88 fL (ref 78.0–100.0)
RBC: 4.42 MIL/uL (ref 4.22–5.81)
WBC: 14.6 10*3/uL — ABNORMAL HIGH (ref 4.0–10.5)

## 2013-03-30 LAB — BASIC METABOLIC PANEL
CO2: 29 mEq/L (ref 19–32)
Chloride: 100 mEq/L (ref 96–112)
Sodium: 136 mEq/L (ref 135–145)

## 2013-03-30 MED ORDER — LEVETIRACETAM 500 MG PO TABS
500.0000 mg | ORAL_TABLET | Freq: Two times a day (BID) | ORAL | Status: DC
  Administered 2013-03-30 – 2013-04-02 (×7): 500 mg via ORAL
  Filled 2013-03-30 (×9): qty 1

## 2013-03-30 MED ORDER — PANTOPRAZOLE SODIUM 40 MG PO TBEC
40.0000 mg | DELAYED_RELEASE_TABLET | Freq: Every day | ORAL | Status: DC
  Administered 2013-03-30 – 2013-04-02 (×4): 40 mg via ORAL
  Filled 2013-03-30 (×4): qty 1

## 2013-03-30 MED ORDER — DEXAMETHASONE 4 MG PO TABS
4.0000 mg | ORAL_TABLET | Freq: Four times a day (QID) | ORAL | Status: DC
  Administered 2013-03-30 – 2013-04-02 (×12): 4 mg via ORAL
  Filled 2013-03-30 (×15): qty 1

## 2013-03-30 NOTE — Op Note (Signed)
NAMEDAIEL, STROHECKER              ACCOUNT NO.:  [id number]  MEDICAL RECORD NO.:  [id number]  LOCATION:  3110                         FACILITY:  [facility]  PHYSICIAN:  Hilda Lias, M.D.   DATE OF BIRTH:  01-09-1956  DATE OF PROCEDURE:  03/29/2013 DATE OF DISCHARGE:                              OPERATIVE REPORT   PREOPERATIVE DIAGNOSIS:  Left parietal brain tumor.  POSTOPERATIVE DIAGNOSIS:  Left parietal brain tumor.  PROCEDURE:  Left parietal craniotomy with the help of the Stealth equipment.  SURGEON:  Hilda Lias, M.D.  ASSISTANT:  Coletta Memos, M.D.  CLINICAL HISTORY:  Mr. Gassert is a gentleman who was admitted over the weakened because of some headache and confusion.  We found by MRI that he has a large left parietal tumor and quite a bit of edema.  The patient was given intravenous Decadron, and eventually the headache got better.  He mentally was doing really well and finally he decided about surgery.  He was given many options including the possibility of one-to- one in the [facility].  We did a chest CT, which was negative for any tumor, most likely, the tumor was primary from the brain.  He and his wife knew the risk of the surgery, surgeon being unable to remove the whole tumor, infection, CSF leak, paralysis, and need for further surgery.  PROCEDURE IN DETAIL:  The patient was taken to the OR, and 3 pins were applied to the head.  He was positioned in a lateral manner with the left side up.  Then, the left parietal area of the scalp was shaved and prepped with DuraPrep.  Prior to that, using the Stealth, we were able to collect about 100% reference point from the left nose, left eye and along the whole left side of the area to identify the area of the tumor. This was done easily and based on that, a marker was used.  Then drapes were applied.  F italic type of incision was made with the center right in the middle of the tumor by the Stealth machine.   Raney clips were applied to the edges.  Then, when we found the bone, 2 burr holes were made and connected.  Immediately, we opened the dura mater in a cursory manner.  Right there in the middle, there was already tumor coming out close to the surface.  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  Then with the help of the microscope, we did a total gross resection until we were able to find edematous brain tissue and no more tumor.  The area was irrigated with copious amounts of solution.  With the probe of the Stealth equipment, we probed the area, and we were right at the level of the old borders.  Then, Surgicel was left into the area for hemostasis.  Once the area was completely dry, we closed the dura mater loosely and then used the DuraGen.  The bone flap was put back in place with plates, and the scalp was closed with Vicryl and staples.  The  Hemovac was left in.          ______________________________ Hilda Lias, M.D.     EB/MEDQ  D:  03/29/2013  T:  03/30/2013  Job:  [id number]

## 2013-03-30 NOTE — Progress Notes (Signed)
UR completed 

## 2013-03-30 NOTE — Progress Notes (Signed)
Patient ID: Martin Martin, male   DOB: [birth year], 57 y.o.   MRN: [id number] Awake, no weakness. hemovac working well. No seizures. Spoke with him and family. Final path report in 72 hours

## 2013-03-31 NOTE — Progress Notes (Signed)
Patient ID: Martin Martin, male   DOB: 1956-08-23, 57 y.o.   MRN: [id number] Stable, no weakness. Wound dry. Oncology to see him.to regular floor

## 2013-04-01 ENCOUNTER — Inpatient Hospital Stay (HOSPITAL_COMMUNITY): Payer: BC Managed Care – PPO

## 2013-04-01 MED ORDER — GADOBENATE DIMEGLUMINE 529 MG/ML IV SOLN
15.0000 mL | Freq: Once | INTRAVENOUS | Status: AC | PRN
  Administered 2013-04-01: 15 mL via INTRAVENOUS

## 2013-04-01 NOTE — Progress Notes (Signed)
Filed Vitals:   03/31/13 2140 04/01/13 0215 04/01/13 0542 04/01/13 0913  BP: 138/82 117/70 120/82 129/69  Pulse: 65 61 55 64  Temp: 98.1 F (36.7 C) 97.3 F (36.3 C) 97.9 F (36.6 C) 98.1 F (36.7 C)  TempSrc: Oral Oral Oral   Resp: 20 20 20 20   Height:      Weight:      SpO2: 98% 97% 98% 100%    CBC  Recent Labs  03/30/13 0430  WBC 14.6*  HGB 13.7  HCT 38.9*  PLT 178   BMET  Recent Labs  03/30/13 0430  NA 136  K 4.4  CL 100  CO2 29  GLUCOSE 130*  BUN 21  CREATININE 0.81  CALCIUM 8.5    Patient sitting up in chair, without complaints. Has been ambulating in halls. Wound clean and dry. Moving all 4 extremities well.  Chart indicates that Dr. Joya Salm has requested oncology consultation. That is pending.  Path report describes glioblastoma multiforme.  Plan: Encouraged patient to ambulate. Await oncology consultation.  Hosie Spangle, MD 04/01/2013, 9:38 AM

## 2013-04-02 MED ORDER — HYDROCODONE-ACETAMINOPHEN 10-325 MG PO TABS: 1.0000 | ORAL_TABLET | ORAL | Status: DC | PRN

## 2013-04-02 MED ORDER — DEXAMETHASONE 4 MG PO TABS: 4.0000 mg | ORAL_TABLET | Freq: Three times a day (TID) | ORAL | Status: DC

## 2013-04-02 MED ORDER — SENNA 8.6 MG PO TABS
1.0000 | ORAL_TABLET | Freq: Every day | ORAL | Status: DC
  Administered 2013-04-02: 8.6 mg via ORAL
  Filled 2013-04-02: qty 1

## 2013-04-02 MED ORDER — LEVETIRACETAM 500 MG PO TABS: 500.0000 mg | ORAL_TABLET | Freq: Two times a day (BID) | ORAL | Status: DC

## 2013-04-02 MED ORDER — DOCUSATE SODIUM 100 MG PO CAPS
100.0000 mg | ORAL_CAPSULE | Freq: Every day | ORAL | Status: DC
  Administered 2013-04-02: 100 mg via ORAL
  Filled 2013-04-02: qty 1

## 2013-04-02 NOTE — Discharge Summary (Signed)
Physician Discharge Summary  Patient ID: Martin Martin MRN: [id number] DOB/AGE: 57/16/1957 57 y.o.  Admit date: 03/24/2013 Discharge date: 04/02/2013  Admission Diagnoses: Left parietal brain tumor  Discharge Diagnoses: Left parietal brain tumor, primary astrocytoma grade 4 Active Problems:   Focal seizure   Discharged Condition: good  Hospital Course: Patient was admitted to undergo surgical resection of a primary brain tumor from left parietal region he tolerated the surgery well postoperative scan shows some resection cavity in the left parietal zone. He is being maintained on Keppra and Decadron. He is discharged home  Consults: None  Significant Diagnostic Studies: MRI brain postoperative with gadolinium enhancement  Treatments: surgery: Left parietal craniotomy subtotal resection of tumor  Discharge Exam: Blood pressure 126/70, pulse 64, temperature 97.7 F (36.5 C), temperature source Oral, resp. rate 20, height 6' (1.829 m), weight 76 kg (167 lb 8.8 oz), SpO2 100.00%. Incision is clean and dry motor function is normal speech is intact.  Disposition: Discharge home  Discharge Orders   Future Orders Complete By Expires     Call MD for:  redness, tenderness, or signs of infection (pain, swelling, redness, odor or green/yellow discharge around incision site)  As directed     Call MD for:  severe uncontrolled pain  As directed     Call MD for:  temperature >100.4  As directed     Diet - low sodium heart healthy  As directed     Discharge instructions  As directed     Comments:      Okay to shower. Do not apply salves or appointments to incision. No heavy lifting with the upper extremities greater than 15 pounds. No driving.    Increase activity slowly  As directed         Medication List    TAKE these medications       dexamethasone 4 MG tablet  Commonly known as:  DECADRON  Take 1 tablet (4 mg total) by mouth 3 (three) times daily.     diltiazem 120 MG 24 hr  capsule  Commonly known as:  CARDIZEM CD  Take 1 capsule (120 mg total) by mouth daily.     HYDROcodone-acetaminophen 10-325 MG per tablet  Commonly known as:  NORCO  Take 1 tablet by mouth every 4 (four) hours as needed.     levETIRAcetam 500 MG tablet  Commonly known as:  KEPPRA  Take 1 tablet (500 mg total) by mouth 2 (two) times daily.     metoprolol tartrate 25 MG tablet  Commonly known as:  LOPRESSOR  Take 25 mg by mouth 2 (two) times daily.         SignedStefani Dama 04/02/2013, 11:47 AM

## 2013-04-13 ENCOUNTER — Telehealth: Payer: Self-pay | Admitting: Oncology

## 2013-04-13 ENCOUNTER — Encounter: Payer: Self-pay | Admitting: *Deleted

## 2013-04-13 NOTE — Telephone Encounter (Signed)
LVOM FOR PT TO RETURN CALL.  °

## 2013-04-13 NOTE — Progress Notes (Signed)
Location/Histology of Brain Tumor: Left Parietal Brain tumor 03/24/13=6.4x4.9cm  Mass with surrounding vasogenic edema  Patient presented with symptoms of:  Headache,dizziness,difficulties with speech,confusion , visual changes ,unable to walk,right sided weakness,     Past or anticipated interventions, if any, per neurosurgery: focal seizure, , 4/9/14Left parietal craniotomy subtotal resection of tumor,Dr.Ernesto Botero Past or anticipated interventions, if any, per medical oncology: placed on Keppra now 500mg  bedtime,changed last Tuesday, ,Dexamethasone now 2mg  tid changed last Tuesday by Dr. Jeral Fruit  Dose of Decadron, if applicable: 4mg  po tid until directed otherwise  Recent neurologic symptoms, if any: weakness,  Seizures:no  Headaches: mild tension in back of neck occasionally   Nausea:no  Dizziness/ataxia: no  Difficulty with hand coordination ;no  Focal numbness/weakness: weakness legs  Visual deficits/changes: no Confusion/Memory deficits: no Painful bone metastases at present, if any: no  SAFETY ISSUES:  Prior radiation? NOPacemaker/ICD? NO  Possible current pregnancy? N/A Is the patient on methotrexate?NO Additional Complaints / other details decreased decadron and keppra had stomach indigestion

## 2013-04-14 ENCOUNTER — Telehealth: Payer: Self-pay | Admitting: Oncology

## 2013-04-14 NOTE — Telephone Encounter (Signed)
S/w pt wife in re to NP appt 05/05 @ 2:30 w/Dr. Welton Flakes Referring Dr. Jeral Fruit Welcome packet mailed.

## 2013-04-14 NOTE — Telephone Encounter (Signed)
C/D 04/14/13 for appt. 04/24/13

## 2013-04-17 ENCOUNTER — Ambulatory Visit
Admission: RE | Admit: 2013-04-17 | Discharge: 2013-04-17 | Disposition: A | Discharge status: Home / Self Care | Payer: BC Managed Care – PPO | Source: Ambulatory Visit | Attending: Radiation Oncology | Admitting: Radiation Oncology

## 2013-04-17 ENCOUNTER — Encounter: Payer: Self-pay | Admitting: Radiation Oncology

## 2013-04-17 VITALS — BP 149/91 | HR 67 | Temp 98.1°F | Resp 20 | Ht 72.0 in | Wt 165.0 lb

## 2013-04-17 DIAGNOSIS — C713 Malignant neoplasm of parietal lobe: Secondary | ICD-10-CM | POA: Insufficient documentation

## 2013-04-17 DIAGNOSIS — IMO0002 Reserved for concepts with insufficient information to code with codable children: Secondary | ICD-10-CM | POA: Insufficient documentation

## 2013-04-17 DIAGNOSIS — Z510 Encounter for antineoplastic radiation therapy: Secondary | ICD-10-CM | POA: Insufficient documentation

## 2013-04-17 NOTE — Progress Notes (Signed)
Met with patient to discuss RO billing.  Patient will bring by [employer] disability forms in the near future either to me or [facility].

## 2013-04-17 NOTE — Progress Notes (Signed)
Patient arrived new consult Glioblastoma  Dx 03/29/13 Alert,oriented x3, married, no children, has tension headaches at times, says this has always been, decreased decadron to 2mg  tid and Keppra 5oomg  At bedtime, since 04/11/13 by Dr.Botereo, was having increasd stomach indigestions, no thrush  Seen  Or noted, patient appetite excellent, drinking plenty of fluids, apple juice, lemon aid, some water No c/o headaches, sob, nausea, dizziness or confusion, no vision changes or blurred vision, ambulatory steady gait, does report weakness since d/c hospital in legs 10:00 AM

## 2013-04-17 NOTE — Progress Notes (Signed)
  Radiation Oncology         [phone] ________________________________  Name: Martin Martin MRN: [id number]  Date: 04/17/2013  DOB: 04/19/1956  SIMULATION AND TREATMENT PLANNING NOTE  DIAGNOSIS:  57 year old gentleman with glioblastoma of the left parietal brain status post biopsy/debulking  NARRATIVE:  The patient was brought to the CT Simulation planning suite.  Identity was confirmed.  All relevant records and images related to the planned course of therapy were reviewed.  The patient freely provided informed written consent to proceed with treatment after reviewing the details related to the planned course of therapy. The consent form was witnessed and verified by the simulation staff.  Then, the patient was set-up in a stable reproducible  supine position for radiation therapy.  CT images were obtained.  Surface markings were placed.  The CT images were loaded into the planning software.  Then the target and avoidance structures were contoured.  Treatment planning then occurred.  The radiation prescription was entered and confirmed.  Then, I designed and supervised the construction of a total of 1 medically necessary complex treatment device as a thermoplastic head immobilization mask.  I have requested : Intensity Modulated Radiotherapy (IMRT) is medically necessary for this case for the following reason:  Critical CNS structure avoidance - brainstem, optic chiasm, optic nerve..  I have ordered:Nutrition Consult  SPECIAL TREATMENT PROCEDURE:  The planned course of therapy using radiation constitutes a special treatment procedure. Special care is required in the management of this patient for the following reasons.  I have requested : This treatment constitutes a Special Treatment Procedure for the following reason: [ Concurrent chemotherapy requiring careful monitoring for increased toxicities of treatment including weekly laboratory values.. The special nature of the planned course of  radiotherapy will require increased physician supervision and oversight to ensure patient's safety with optimal treatment outcomes.  PLAN:  The patient will receive 60 Gy in 30 fractions starting on 5/6.  ________________________________  Artist Pais. Kathrynn Running, M.D.

## 2013-04-17 NOTE — Progress Notes (Signed)
Please see the Nurse Progress Note in the MD Initial Consult Encounter for this patient. 

## 2013-04-17 NOTE — Progress Notes (Signed)
Radiation Oncology         [phone] ________________________________  Initial outpatient Consultation  Name: Martin Martin MRN: [id number]  Date: 04/17/2013  DOB: Sep 02, 1956  WU:[id number], Dorinda Hill, MD  Karn Cassis, MD   REFERRING PHYSICIAN: Karn Cassis, MD  DIAGNOSIS: 57 year old gentleman with glioblastoma of the left parietal brain status post biopsy/debulking  HISTORY OF PRESENT ILLNESS::Martin Martin is a 57 y.o. male who presented to the emergency department on 03/24/2013 with a one-day history of increasingly severe disorientation and confusion. His wife brought him to urgent care where he was noted to have difficulty answering basic questions as well as some ataxia. He had some confusion and his speech as well as some difficulty with vision. He was transported by a boost to the emergency department. Head CT without contrast revealed a 6.4 cm area of low attenuation with surrounding vasogenic edema in the left parietal lobe. Subsequent MRI on that date further: Needed a large partially necrotic 36x47x39 mm lesion with surrounding vasogenic edema. CT of the chest on April 5 showed no overt primary tumor to support that the brain lesion may in fact be a metastatic deposit. Head CT was performed for image guidance. The patient proceeded to left parietal brain biopsy and resection. Pathology yielded glioblastoma. Postoperative brain MRI with and without contrast within 72 hours revealed subtotal decompression with some mild postoperative hemorrhage centrally the vasogenic edema had resolved. The patient has, been referred today to discuss potential radiation treatment options and will meet with Dr. Welton Flakes on May 5.  PREVIOUS RADIATION THERAPY: No  PAST MEDICAL HISTORY:  has a past medical history of Hyperlipidemia; Right bundle branch block; Tachycardia; Dyslipidemia; Coronary artery disease (CAD) excluded; Hypertension; and Allergy.    PAST SURGICAL HISTORY: Past Surgical  History  Procedure Laterality Date  . Left inguinal hernia.  2004  . Craniotomy Left 03/29/2013    Procedure: CRANIOTOMY TUMOR EXCISION;  Surgeon: Karn Cassis, MD;  Location: [facility];  Service: Neurosurgery;  Laterality: Left;  Left Craniotomy for tumor excision    FAMILY HISTORY: family history is not on file.  SOCIAL HISTORY:  reports that he quit smoking about 8 years ago. His smoking use included Cigarettes. He has a 30 pack-year smoking history. He has never used smokeless tobacco. He reports that he does not drink alcohol or use illicit drugs.  ALLERGIES: Dilaudid and Morphine and related  MEDICATIONS:  Current Outpatient Prescriptions  Medication Sig Dispense Refill  . dexamethasone (DECADRON) 4 MG tablet Take 1 tablet (4 mg total) by mouth 3 (three) times daily.  90 tablet  3  . diltiazem (CARDIZEM CD) 120 MG 24 hr capsule Take 1 capsule (120 mg total) by mouth daily.  90 capsule  3  . HYDROcodone-acetaminophen (NORCO) 10-325 MG per tablet Take 1 tablet by mouth every 4 (four) hours as needed.  30 tablet  0  . levETIRAcetam (KEPPRA) 500 MG tablet Take 1 tablet (500 mg total) by mouth 2 (two) times daily.  60 tablet  3  . metoprolol tartrate (LOPRESSOR) 25 MG tablet Take 25 mg by mouth 2 (two) times daily.       No current facility-administered medications for this encounter.    REVIEW OF SYSTEMS:  A 15 point review of systems is documented in the electronic medical record. This was obtained by the nursing staff. However, I reviewed this with the patient to discuss relevant findings and make appropriate changes.  Pertinent items are noted in HPI.  PHYSICAL EXAM:  vitals were not taken for this visit.  Per Neurology (Pre-Op):  Mental Status:  Alert, moderately severe receptive and expressive aphasia.  Cranial Nerves:  II-dense right homonymous hemianopsia.  III/IV/VI-Pupils were equal and reacted. Extraocular movements were full and conjugate.  V/VII-no facial  numbness; mild right lower facial weakness.  VIII-normal.  X-normal speech and symmetrical palatal movement.  XII-midline tongue extension  Motor: 5/5 bilaterally with normal tone and bulk  Sensory: Normal throughout.  Deep Tendon Reflexes: 2+ and symmetric.  Plantars: Flexor bilaterally  Cerebellar: Normal finger-to-nose testing.    LABORATORY DATA:  Lab Results  Component Value Date   WBC 14.6* 03/30/2013   HGB 13.7 03/30/2013   HCT 38.9* 03/30/2013   MCV 88.0 03/30/2013   PLT 178 03/30/2013   Lab Results  Component Value Date   NA 136 03/30/2013   K 4.4 03/30/2013   CL 100 03/30/2013   CO2 29 03/30/2013   Lab Results  Component Value Date   ALT 101* 03/29/2013   AST 19 03/29/2013   ALKPHOS 50 03/29/2013   BILITOT 0.5 03/29/2013     RADIOGRAPHY: Ct Head Wo Contrast  03/24/2013  *RADIOLOGY REPORT*  Clinical Data:  Code stroke, acute onset right eye visual field loss, difficulty finding words, altered speech patterns, right side weakness, unable to walk, history hyperlipidemia, coronary artery disease, arrhythmia, hypertension, former smoker  CT HEAD WITHOUT CONTRAST  Technique:  Contiguous axial images were obtained from the base of the skull through the vertex without contrast.  Comparison: None  Findings: Normal ventricular morphology. No midline shift. Large area of low attenuation identified at the posterior left parietal lobe, 6.4 x 4.9 cm image 19, question central mass with surrounding vasogenic edema, felt unlikely to represent an infarct. Additional small area of questionable white matter hypoattenuation at left vertex. Remaining brain parenchyma normal in appearance. No intracranial hemorrhage or extra-axial fluid collections. Bones and sinuses unremarkable.  IMPRESSION: 6.4 x 4.9 cm diameter area of low attenuation in left parietal lobe suspicious for mass with surrounding vasogenic edema, question primary versus metastatic lesion. Further evaluation by MR imaging of brain with/without  contrast recommended.  Critical Value/emergent results were called by telephone at the time of interpretation on 03/24/2013 at 0948 hours to Dr. Roseanne Reno, who verbally acknowledged these results.   Original Report Authenticated By: Ulyses Southward, M.D.    Ct Head W Contrast  03/28/2013  *RADIOLOGY REPORT*  Clinical Data: Presurgical planning.  Left parietal lobe tumor.  CT HEAD WITH CONTRAST  Technique:  Contiguous axial images were obtained from the base of the skull through the vertex with intravenous contrast  Contrast: 60mL OMNIPAQUE IOHEXOL 300 MG/ML  SOLN  Comparison: MRI brain 03/24/2013.  Findings: The left parietal lobe tumor is similar in size to the prior study, measuring 4.5 cm maximally.  Surrounding vasogenic edema is similar as well.  There is partial effacement of the left lateral ventricle.  There is no significant hemorrhage or new infarct.  The remainder of the brain is unremarkable.  The paranasal sinuses and mastoid air cells are clear.  The osseous skull is intact.  IMPRESSION:  1.  Stable to slight decrease in size of the left parietal tumor. 2.  Similar appearance of surrounding vasogenic edema.   Original Report Authenticated By: Marin Roberts, M.D.    Ct Chest W Contrast  03/25/2013  **ADDENDUM** CREATED: 03/25/2013 11:54:17  Findings discussed with  Dr. Jeral Fruit on 03/25/2013  at  11:45  **END ADDENDUM** SIGNED  BY: Suzy Bouchard, M.D.   03/25/2013  *RADIOLOGY REPORT*  Clinical Data: New parietal brain tumor  CT CHEST WITH CONTRAST  Technique:  Multidetector CT imaging of the chest was performed following the standard protocol during bolus administration of intravenous contrast.  Contrast: 56mL OMNIPAQUE IOHEXOL 300 MG/ML  SOLN  Comparison: CT 03/24/2013, MRI 04/03/2013  Findings: Review of the lung parenchyma demonstrates no suspicious pulmonary nodules to suggest a lung primary for the brain metastasis.  There is a small 5 mm nodule along the left oblique fissure (image 19).  There is  no axillary or supraclavicular lymphadenopathy.  The thyroid gland is normal.  No mediastinal or hilar lymphadenopathy. No pericardial fluid.  Esophagus is normal.  Limited view of the upper abdomen demonstrates hydronephrosis and dilatation of the left renal collecting system.  The left kidney is only partially imaged.  There is a calculus in the mid-left kidney measuring 4 mm.  There is perinephric stranding on the left.  The right kidney is normal.  Adrenal glands are normal.  There is no focal hepatic lesion on limited view of the liver.  The pancreas is incompletely imaged appears normal.  Limited view of the skeleton is unremarkable.  IMPRESSION:  1.  No evidence of primary lung cancer. 2.  Small 5 mm nodule along the left oblique fissure is likely benign.  Consider attention on follow-up. 3.  Hydronephrosis of the left kidney with perinephric stranding is consistent with distal obstruction.  Prior CT demonstrates renal calculi so favor a ureteral stone obstruction but cannot exclude obstructing pelvic mass.  Recommend CT of the abdomen and pelvis to define obstructing lesion.   Original Report Authenticated By: Suzy Bouchard, M.D.    Mr Jeri Cos Wo Contrast  04/01/2013  *RADIOLOGY REPORT*  Clinical Data: Postop brain tumor resection. Pathologically, glioblastoma multiforme.  MRI HEAD WITHOUT AND WITH CONTRAST  Technique:  Multiplanar, multiecho pulse sequences of the brain and surrounding structures were obtained according to standard protocol without and with intravenous contrast  Contrast: 66mL MULTIHANCE GADOBENATE DIMEGLUMINE 529 MG/ML IV SOLN  Comparison: Preoperative MR 03/24/2013.  Findings: The patient is status post left parietal craniotomy and subtotal decompression of a grade IV malignant astrocytoma/ glioblastoma multiforme.  There is an expected degree of postoperative hemorrhage centrally. This does not exert mass effect on the surrounding structures.  Diffusion weighted images demonstrate  some restricted diffusion surrounding the area of surgery.  The tumor did not display significant restricted diffusion preoperatively.  The findings could reflect some degree of peripheral ischemia surrounding brain postoperatively, possibly related to venous infarction.  There is significant debulking of the central enhancing portion of the tumor as compared with the preoperative exam.  Post infusion imaging demonstrates peripheral enhancement along the deep margins of the tumor bed.  This enhancement is somewhat thin superiorly and inferiorly, but is more nodular medially measuring up to 6 mm thickness ( image 8 series 11).  Residual tumor is suspected.  This enhancement is surrounded by moderate vasogenic edema which extends to the ependymal lining of the lateral ventricle.  There is no significant midline shift.  There is no hydrocephalus or uncal herniation.  Mild meningeal enhancement related to recent craniotomy.  Minimal 6 mm extra-axial fluid collection at the surgical site is a not unusual postoperative finding.  2 mm subdural collection over the left hemisphere is also postoperative in nature, non compressive.  The major intracranial vascular structures are patent.  Clear sinuses and mastoids.  Negative orbits.  IMPRESSION: Status post left parietal craniotomy and debulking of a malignant glioma. Post contrast enhancement is most bulky medially and is surrounded by moderate edema.  Residual tumor is likely.  Foci of restricted diffusion not present preoperatively surround the area of surgical debulking; some degree of peripheral ischemia/infarction following tumor debulking is not excluded.   Original Report Authenticated By: Rolla Flatten, M.D.    Mr Jeri Cos Wo Contrast  03/28/2013  **ADDENDUM** CREATED: 03/28/2013 19:09:46  The body of the report states right parietal lobe.  The impressions states left parietal lobe lesion.  The lesion is in fact in the left parietal lobe.  **END ADDENDUM** SIGNED BY:  Arna Snipe, M.D.   03/24/2013  *RADIOLOGY REPORT*  Clinical Data: New onset of visual changes and confusion, headache and dizziness.  MRI HEAD WITHOUT AND WITH CONTRAST  Technique:  Multiplanar, multiecho pulse sequences of the brain and surrounding structures were obtained according to standard protocol without and with intravenous contrast  Contrast: 38mL MULTIHANCE GADOBENATE DIMEGLUMINE 529 MG/ML IV SOLN  Comparison: CT scan earlier in the day.  Findings: Large, partially necrotic, intra-axial mass lesion in the right parietal lobe with tiny focus of central hemorrhage.  Marked surrounding vasogenic edema.   No other lesions are seen.  Cross- sectional measurements are 36 x 47 x 39 mm.  Central and peripheral enhancement although necrosis of at least 50% lesion is seen centrally.  Vasogenic edema as does not cross the midline.Slight premature atrophy is present.  No white matter disease.  Major intracranial vascular structures patent.  Calvarium intact.  No midline abnormality.  No osseous findings. Mild sinus disease.  IMPRESSION: Solitary 36 x 47 x 39 mm centrally necrotic mass left parietal with surrounding vasogenic edema. Tiny focus of hemorrhage centrally. Considerations include glioblastoma multiforme versus solitary metastasis although no primary is listed.  Given the patient's smoking history, metastatic disease is not excluded.   Original Report Authenticated By: Rolla Flatten, M.D.       IMPRESSION:  This patient is a very nice 57 year old gentleman with glioblastoma of the left parietal brain status post biopsy/debulking.  He has recovered following surgery and would certainly benefit from adjuvant radiotherapy with concurrent Temodar.  PLAN:Today, I talked to the patient and family about the findings and work-up thus far.  We discussed the natural history of disease and general treatment, highlighting the role or radiotherapy in the management.  We discussed the available radiation  techniques, and focused on the details of logistics and delivery.  We reviewed the anticipated acute and late sequelae associated with radiation in this setting.  The patient was encouraged to ask questions that I answered to the best of my ability.  I filled out a patient counseling form during our discussion including treatment diagrams.  We retained a copy for our records.  The patient would like to proceed with radiation and will be scheduled for CT simulation at 1 pm today to potentially start radiation on 04/25/13.  I spent 60 minutes minutes face to face with the patient and more than 50% of that time was spent in counseling and/or coordination of care.    ------------------------------------------------  Sheral Apley. Tammi Klippel, M.D.

## 2013-04-17 NOTE — Patient Instructions (Signed)
Glioblastoma Multiforme   Glioblastoma multiforme (GBM) (also called glioblastoma) is a fast-growing glioma that develops from astrocytes - star-shaped glial cells that support nerve cells. GBM is classified as a grade IV astrocytoma. These are the most invasive type of glial tumors, rapidly growing and commonly spreading to nearby brain tissue. They may be composed of several different kinds of cells (i.e. astrocytes, oligodendrocytes). Sometimes, they evolve from a low-grade astrocytoma or an oligodendroglioma. In adults, GBM occurs most often in the cerebral hemispheres, especially in the frontal and temporal lobes of the brain. GBM is a devastating brain cancer that typically results in death in the first 15 months after diagnosis.   GBMs are biologically aggressive tumors that present unique treatment challenges due to the following characteristics:   ?Localization of tumors in the brain  ?Inherent resistance to conventional therapy  ?Limited capacity of the brain to repair itself  ?Migration of malignant cells into adjacent brain tissue  ?The variably disrupted tumor blood supply which inhibits drug delivery  ?Tumor capillary leakage, resulting in an accumulation of fluid around the tumor (peritumoral edema) and intracranial hypertension  ?A limited response to therapy   ?The neurotoxicity of treatments directed at gliomas Prevalence and Incidence   The [employer] estimates that 22,910 adults (12,630 men and 10,280 women) will be diagnosed with brain and other nervous system tumors in 04/06/2011. It also estimates that in 06-Apr-2011, 13,700 of these diagnoses will result in death.  GBM accounts for about 15 percent of all brain tumors and primarily occurs in adults between the ages of 45 and 66. Between 04/05/2004 and April 05, 2008, the median age for death from cancer of the brain and other areas of the nervous system was age 78.  Symptoms   Symptoms vary depending on the location of the brain  tumor, but may include any of the following:   ?Persistent headaches  ?Double or blurred vision  ?Vomiting  ?Loss of appetite  ?Changes in mood and personality  ?Changes in ability to think and learn  ?New seizures  ?Speech difficulty of gradual onset  Diagnosis   Sophisticated imaging techniques can pinpoint brain tumors. Diagnostic tools include computed tomography (CT or CAT scan) and magnetic resonance imaging (MRI). Intraoperative MRI also is used during surgery to guide tissue biopsies and tumor removal. Magnetic resonance spectroscopy (MRS) is used to examine the tumor's chemical profile and determine the nature of the lesions seen on the MRI. Positron emission tomography (PET scan) can help detect recurring brain tumors.  Grading   After a brain tumor is detected on a CT or MRI scan, a neurosurgeon obtains tumor tissue for a biopsy and the tissue is examined by a neuropathologist. The analysis of tumor tissue under a microscope is used to assign the tumor name and grade, and provides answers to the following questions: 1) From what type of brain cell did the tumor arise? (The name of the tumor is derived from this; for example, astrocytomas arise from astrocytes.); 2) Are there signs of rapid growth in the tumor cells?  The tumor name and grade help determine treatment options and also provide important information about prognosis. For more information on grading, click here.  Treatment Options   Palliative care, without any active intervention or treatment, is an option for patients who are disabled. Treatment options for others include surgery, radiation, radiosurgery or chemotherapy. The primary objective of surgery is to remove as much of the tumor as possible without injuring brain tissue needed for neurological  function (such as motor skills, the ability to speak and walk, etc.). However, high-grade tumors are surrounded by a zone of migrating, infiltrating tumor cells that invade  surrounding tissues, making it more difficult to remove the entire tumor. If the tumor cannot be removed completely, surgery can reduce the amount of solid tumor tissue, remove those cells in the center of the tumor that may be resistant to treatment and reduce intracranial pressure. Debulking surgery can prolong the lives of some patients or improve the quality of remaining life.   In most cases, surgeons perform a craniotomy, opening the skull to reach the tumor site. This is done frequently with image guidance, and at times using intra-operative mapping techniques to determine the locations of motor, sensory and speech/language cortex. Intraoperative mapping involves operating on a conscious patient and mapping the anatomy of their language function during the operation. The doctor then decides which portions of the tumor are safe to resect.   Sometime after surgery, when the wound is healed, radiation therapy can begin. The goal of radiation therapy is to kill tumor cells selectively while leaving normal brain tissue unharmed. In standard external beam radiation therapy, multiple sessions of standard-dose "fractions" of radiation are delivered to the tumor site as well as a margin in order to treat the zone of infiltrating tumor cells. Each treatment induces damage to both healthy and normal tissue. By the time the next treatment is given, most of the normal cells have repaired the damage, but the tumor tissue has not. This process is repeated for a total of 10 to 30 treatments, depending on the type of tumor. This additional treatment provides most patients with improved outcomes and longer survival rates compared to surgery alone or the best supportive care.   Radiosurgery is a treatment method that uses specialized radiation delivery systems to focus radiation at the site of the tumor while minimizing the radiation dose to the surrounding brain. Radiosurgery may be used in select cases for tumor  recurrence, often using additional information derived from MRS (magnetic resonance spectroscopy) or PET scans (positron emission tomography).   Patients undergoing chemotherapy are administered special drugs designed to kill tumor cells. Chemotherapy with the drug temozolomide is the current standard of treatment for GBM. The drug is administered every day during radiation therapy and then in six to eight cycles for five days at higher doses once radiation is completed. While the aim of chemotherapy is long-term tumor control, it does so in only about 20 percent of patients. The decision to prescribe other forms of chemotherapy for tumor recurrence is based on a patient's overall health, type of tumor and extent of the cancer. Before considering chemotherapy, patients should discuss it with their oncologists and/or neuro-oncologists.  Because traditional methods of treatment are unlikely to result in a prolonged remission of GBM tumors, researchers presently are investigating several innovative treatments in clinical trials. A number of these treatments are available on an investigational basis at centers specializing in brain-tumor therapies. These include gene therapy, highly focused radiation therapy, immunotherapy and chemotherapies utilized in conjunction with vaccines. One promising vaccine and chemotherapy study yielded an average time for recurrence of the tumor after treatment of 16.6 months, as compared to the previous six-month average recurrence. However, it is important to note that while some of these investigational treatments show promise, the most effective therapies introduced over the past three decades have improved median survival of GBM patients by an average of only three months.   Glioblastoma Multiforme  Resources   These websites offer additional helpful information on glioblastoma multiforme, its causes, as well as treatment options, support and more (Note: these sites are not  under the auspice of AANS, and their listing here should not be seen as an endorsement of these sites or their content).   American Brain Tumor Association - www.abta.[place].org Brain Tumor Society - www.braintumor.[facility] - https://ingram.com/

## 2013-04-18 NOTE — Addendum Note (Signed)
Encounter addended by: Rahmir Beever Mintz Kaydin Karbowski, RN on: 04/18/2013 10:48 AM<BR>     Documentation filed: Charges VN

## 2013-04-21 ENCOUNTER — Other Ambulatory Visit: Payer: Self-pay | Admitting: Medical Oncology

## 2013-04-21 DIAGNOSIS — C713 Malignant neoplasm of parietal lobe: Secondary | ICD-10-CM

## 2013-04-24 ENCOUNTER — Ambulatory Visit: Payer: BC Managed Care – PPO

## 2013-04-24 ENCOUNTER — Ambulatory Visit (HOSPITAL_BASED_OUTPATIENT_CLINIC_OR_DEPARTMENT_OTHER): Payer: BC Managed Care – PPO | Admitting: Oncology

## 2013-04-24 ENCOUNTER — Telehealth: Payer: Self-pay | Admitting: *Deleted

## 2013-04-24 ENCOUNTER — Encounter: Payer: Self-pay | Admitting: Medical Oncology

## 2013-04-24 ENCOUNTER — Encounter: Payer: Self-pay | Admitting: Oncology

## 2013-04-24 ENCOUNTER — Other Ambulatory Visit (HOSPITAL_BASED_OUTPATIENT_CLINIC_OR_DEPARTMENT_OTHER): Payer: BC Managed Care – PPO | Admitting: Lab

## 2013-04-24 VITALS — BP 143/82 | HR 69 | Temp 98.3°F | Resp 20 | Ht 72.0 in | Wt 171.8 lb

## 2013-04-24 DIAGNOSIS — C713 Malignant neoplasm of parietal lobe: Secondary | ICD-10-CM

## 2013-04-24 DIAGNOSIS — F411 Generalized anxiety disorder: Secondary | ICD-10-CM

## 2013-04-24 LAB — CBC WITH DIFFERENTIAL/PLATELET
Basophils Absolute: 0 10*3/uL (ref 0.0–0.1)
Eosinophils Absolute: 0 10*3/uL (ref 0.0–0.5)
HCT: 37.5 % — ABNORMAL LOW (ref 38.4–49.9)
HGB: 12.6 g/dL — ABNORMAL LOW (ref 13.0–17.1)
LYMPH%: 8.9 % — ABNORMAL LOW (ref 14.0–49.0)
MCV: 93.5 fL (ref 79.3–98.0)
MONO#: 0.8 10*3/uL (ref 0.1–0.9)
MONO%: 9.7 % (ref 0.0–14.0)
NEUT#: 7.1 10*3/uL — ABNORMAL HIGH (ref 1.5–6.5)
Platelets: 175 10*3/uL (ref 140–400)

## 2013-04-24 LAB — COMPREHENSIVE METABOLIC PANEL (CC13)
Albumin: 3 g/dL — ABNORMAL LOW (ref 3.5–5.0)
Alkaline Phosphatase: 48 U/L (ref 40–150)
BUN: 24.2 mg/dL (ref 7.0–26.0)
CO2: 30 mEq/L — ABNORMAL HIGH (ref 22–29)
Glucose: 100 mg/dl — ABNORMAL HIGH (ref 70–99)
Total Bilirubin: 0.61 mg/dL (ref 0.20–1.20)

## 2013-04-24 LAB — TECHNOLOGIST REVIEW

## 2013-04-24 MED ORDER — ONDANSETRON HCL 8 MG PO TABS: ORAL_TABLET | ORAL | Status: DC

## 2013-04-24 MED ORDER — SULFAMETHOXAZOLE-TRIMETHOPRIM 800-160 MG PO TABS: ORAL_TABLET | ORAL | Status: DC

## 2013-04-24 MED ORDER — ALPRAZOLAM 0.25 MG PO TABS
0.2500 mg | ORAL_TABLET | Freq: Every evening | ORAL | Status: DC | PRN
Start: 2013-04-24 — End: 2013-11-02

## 2013-04-24 MED ORDER — TEMOZOLOMIDE 5 MG PO CAPS: ORAL_CAPSULE | ORAL | Status: DC

## 2013-04-24 MED ORDER — TEMOZOLOMIDE 140 MG PO CAPS: 75.0000 mg/m2/d | ORAL_CAPSULE | Freq: Every day | ORAL | Status: DC

## 2013-04-24 NOTE — Progress Notes (Signed)
[facility]  [phone] [phone]   MEDICAL ONCOLOGY - INITIAL CONSULATION    Referral MD  Dr Eliane Decree  Reason for Referral: 6 gentleman with new diagnosis of glioblastoma of the left parietal brain status post biopsy/debulking.   Chief Complaint  Patient presents with  . new patient  : Glioblastoma WHO grade 24 measuring 0.6 and 2.7 cm in aggregate.   HPI: Patient is a very pleasant 57 year old gentleman with new diagnosis of glioblastoma. Patient initially presented on 03/24/2013 with increasing severe disorientation and confusion. Because of this he was seen in the urgent care. He was noted to have some ataxia as well as confusion and speech difficulty with visual disturbances as well. He had a CT of the head without contrast that revealed 6.4 cm area of low attenuation with surrounding vasogenic edema in the left parietal lobe. An MRI performed showed a large partially necrotic 36 x 47 x 39 mm lesion with surrounding vasogenic edema. CT of the chest and abdomen showed no primary tumor. Patient went on to have left parietal brain biopsy and resection that showed WHO grade 4 glioblastoma. Postop brain MRI showed subtotal decompression with some mild postoperative hemorrhage centrally with vasogenic edema had resolved. He has been seen by Dr. Margaretmary Dys for radiation treatment and he is now seeing me for discussion regarding concurrent and adjuvant chemotherapy for glioblastoma. Clinically he is doing well without any significant problems. His wife accompanies him. He does however tell me that he is on Decadron and he is experiencing significant amount of insomnia.     Past Medical History  Diagnosis Date  . Hyperlipidemia   . Right bundle branch block     Normal LV function normal aortic and mitral valve bedside echocardiogram 4 2012  . Tachycardia     Resolved  . Dyslipidemia   . Coronary artery disease (CAD) excluded     Cardiolite  study 2006 within normal limits  . Hypertension   . Allergy   :  Past Surgical History  Procedure Laterality Date  . Left inguinal hernia.  2004  . Craniotomy Left 03/29/2013    Procedure: CRANIOTOMY TUMOR EXCISION;  Surgeon: Karn Cassis, MD;  Location: [facility];  Service: Neurosurgery;  Laterality: Left;  Left Craniotomy for tumor excision  :  Current Outpatient Prescriptions  Medication Sig Dispense Refill  . calcium carbonate (TUMS - DOSED IN MG ELEMENTAL CALCIUM) 500 MG chewable tablet Chew 1 tablet by mouth daily. For indigestion      . dexamethasone (DECADRON) 4 MG tablet Take 2 mg by mouth 3 (three) times daily.      Marland Kitchen diltiazem (CARDIZEM CD) 120 MG 24 hr capsule Take 1 capsule (120 mg total) by mouth daily.  90 capsule  3  . HYDROcodone-acetaminophen (NORCO) 10-325 MG per tablet Take 1 tablet by mouth every 4 (four) hours as needed.  30 tablet  0  . levETIRAcetam (KEPPRA) 500 MG tablet Take 500 mg by mouth at bedtime. Changed by Dr. Jeral Fruit 04/11/13      . metoprolol succinate (TOPROL-XL) 25 MG 24 hr tablet Take 25 mg by mouth 2 (two) times daily.      Marland Kitchen ALPRAZolam (XANAX) 0.25 MG tablet Take 1 tablet (0.25 mg total) by mouth at bedtime as needed for sleep.  30 tablet  0  . metoprolol tartrate (LOPRESSOR) 25 MG tablet       . ondansetron (ZOFRAN) 8 MG tablet Take 1/2 hour prior to  temodar daily  30 tablet  3  . sulfamethoxazole-trimethoprim (BACTRIM DS,SEPTRA DS) 800-160 MG per tablet Take 1 pill daily  30 tablet  5  . temozolomide (TEMODAR) 140 MG capsule Take 1 capsule (140 mg total) by mouth daily. May take on an empty stomach or at bedtime to decrease nausea & vomiting.  30 capsule  2  . temozolomide (TEMODAR) 5 MG capsule Take 2 capsules daily  60 capsule  3   No current facility-administered medications for this visit.     Allergies  Allergen Reactions  . Dilaudid (Hydromorphone Hcl) Nausea And Vomiting  . Morphine And Related Nausea And Vomiting  :  No family  history on file.:  History   Social History  . Marital Status: Married    Spouse Name: Searls,[name]    Number of Children: N/A  . Years of Education: N/A   Occupational History  .  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 Smoking status: Former Smoker -- 1.00 packs/day for 30 years    Types: Cigarettes    Quit date: 03/21/2005  . Smokeless tobacco: Never Used  . Alcohol Use: No  . Drug Use: No  . Sexually Active: Not on file   Other Topics Concern  . Not on file   Social History Narrative  . No narrative on file  :  A comprehensive review of systems was negative.  Exam: Filed Vitals:   04/24/13 1458  BP: 143/82  Pulse: 69  Temp: 98.3 F (36.8 C)  TempSrc: Oral  Resp: 20  Height: 6' (1.829 m)  Weight: 171 lb 12.8 oz (77.928 kg)     General:  well-nourished in no acute distress.  Eyes:  no scleral icterus.  ENT:  There were no oropharyngeal lesions.  Neck was without thyromegaly.  Lymphatics:  Negative cervical, supraclavicular or axillary adenopathy.  Respiratory: lungs were clear bilaterally without wheezing or crackles.  Cardiovascular:  Regular rate and rhythm, S1/S2, without murmur, rub or gallop.  There was no pedal edema.  GI:  abdomen was soft, flat, nontender, nondistended, without organomegaly.  Muscoloskeletal:  no spinal tenderness of palpation of vertebral spine.  Skin exam was without echymosis, petichae.  Neuro exam was nonfocal.  Patient was able to get on and off exam table without assistance.  Gait was normal.  Patient was alerted and oriented.  Attention was good.     Lab Results  Component Value Date   WBC 8.7 04/24/2013   HGB 12.6* 04/24/2013   HCT 37.5* 04/24/2013   PLT 175 04/24/2013   GLUCOSE 100* 04/24/2013   ALT 80* 04/24/2013   AST 17 04/24/2013   NA 138 04/24/2013   K 4.5 04/24/2013   CL 99 04/24/2013   CREATININE 0.7 04/24/2013   BUN 24.2 04/24/2013   CO2 30* 04/24/2013   INR 0.94 03/24/2013  PATHOLOGY  Diagnosis 1. 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 SEE COMMENT. 2. Brain, for tumor resection, Left parietal - HIGH GRADE ASTROCYTOMA/GLIOBLASTOMA (WHO GRADE IV). SEE COMMENT. Microscopic Comment 1. ONCOLOGY TABLE - BRAIN AND SPINAL CORD 1. Procedure: Excision. 2. Tumor site, including laterality: Left parietal 3. Maximum tumor size (cm): 0.6 cm and 2.7 cm in aggregate. 4. Histologic type: Astrocytoma. 5. Grade: WHO grade IV 6. Margins (if applicable): Can not be assessed 7. Ancillary studies: Can be performed upon request. 8. Comment: The case was reviewed with Dr. Lyndon Code who concurs. (CRR:gt, 03/30/13) [place]  RUND DO Pathologist, Electronic Signature (Case signed 03/30/2013) Intraoperative Diagnosis BRAIN TUMOR, LEFT PARIETAL, BIOPSY, FROZEN SECTION: HIGH GRADE GLIOMA (CRR) Total: Ct Head W Contrast  03/28/2013  *RADIOLOGY REPORT*  Clinical Data: Presurgical planning.  Left parietal lobe tumor.  CT HEAD WITH CONTRAST  Technique:  Contiguous axial images were obtained from the base of the skull through the vertex with intravenous contrast  Contrast: 52mL OMNIPAQUE IOHEXOL 300 MG/ML  SOLN  Comparison: MRI brain 03/24/2013.  Findings: The left parietal lobe tumor is similar in size to the prior study, measuring 4.5 cm maximally.  Surrounding vasogenic edema is similar as well.  There is partial effacement of the left lateral ventricle.  There is no significant hemorrhage or new infarct.  The remainder of the brain is unremarkable.  The paranasal sinuses and mastoid air cells are clear.  The osseous skull is intact.  IMPRESSION:  1.  Stable to slight decrease in size of the left parietal tumor. 2.  Similar appearance of surrounding vasogenic edema.   Original Report Authenticated By: San Morelle, M.D.    Mr Jeri Cos Wo Contrast  04/01/2013  *RADIOLOGY REPORT*  Clinical Data: Postop brain tumor resection. Pathologically, glioblastoma multiforme.  MRI HEAD WITHOUT AND  WITH CONTRAST  Technique:  Multiplanar, multiecho pulse sequences of the brain and surrounding structures were obtained according to standard protocol without and with intravenous contrast  Contrast: 24mL MULTIHANCE GADOBENATE DIMEGLUMINE 529 MG/ML IV SOLN  Comparison: Preoperative MR 03/24/2013.  Findings: The patient is status post left parietal craniotomy and subtotal decompression of a grade IV malignant astrocytoma/ glioblastoma multiforme.  There is an expected degree of postoperative hemorrhage centrally. This does not exert mass effect on the surrounding structures.  Diffusion weighted images demonstrate some restricted diffusion surrounding the area of surgery.  The tumor did not display significant restricted diffusion preoperatively.  The findings could reflect some degree of peripheral ischemia surrounding brain postoperatively, possibly related to venous infarction.  There is significant debulking of the central enhancing portion of the tumor as compared with the preoperative exam.  Post infusion imaging demonstrates peripheral enhancement along the deep margins of the tumor bed.  This enhancement is somewhat thin superiorly and inferiorly, but is more nodular medially measuring up to 6 mm thickness ( image 8 series 11).  Residual tumor is suspected.  This enhancement is surrounded by moderate vasogenic edema which extends to the ependymal lining of the lateral ventricle.  There is no significant midline shift.  There is no hydrocephalus or uncal herniation.  Mild meningeal enhancement related to recent craniotomy.  Minimal 6 mm extra-axial fluid collection at the surgical site is a not unusual postoperative finding.  2 mm subdural collection over the left hemisphere is also postoperative in nature, non compressive.  The major intracranial vascular structures are patent.  Clear sinuses and mastoids.  Negative orbits.  IMPRESSION: Status post left parietal craniotomy and debulking of a malignant glioma.  Post contrast enhancement is most bulky medially and is surrounded by moderate edema.  Residual tumor is likely.  Foci of restricted diffusion not present preoperatively surround the area of surgical debulking; some degree of peripheral ischemia/infarction following tumor debulking is not excluded.   Original Report Authenticated By: Rolla Flatten, M.D.       Ct Head W Contrast  03/28/2013  *RADIOLOGY REPORT*  Clinical Data: Presurgical planning.  Left parietal lobe tumor.  CT HEAD WITH CONTRAST  Technique:  Contiguous axial images were obtained from the base of the skull  through the vertex with intravenous contrast  Contrast: 60mL OMNIPAQUE IOHEXOL 300 MG/ML  SOLN  Comparison: MRI brain 03/24/2013.  Findings: The left parietal lobe tumor is similar in size to the prior study, measuring 4.5 cm maximally.  Surrounding vasogenic edema is similar as well.  There is partial effacement of the left lateral ventricle.  There is no significant hemorrhage or new infarct.  The remainder of the brain is unremarkable.  The paranasal sinuses and mastoid air cells are clear.  The osseous skull is intact.  IMPRESSION:  1.  Stable to slight decrease in size of the left parietal tumor. 2.  Similar appearance of surrounding vasogenic edema.   Original Report Authenticated By: Marin Roberts, M.D.    Mr Laqueta Jean Wo Contrast  04/01/2013  *RADIOLOGY REPORT*  Clinical Data: Postop brain tumor resection. Pathologically, glioblastoma multiforme.  MRI HEAD WITHOUT AND WITH CONTRAST  Technique:  Multiplanar, multiecho pulse sequences of the brain and surrounding structures were obtained according to standard protocol without and with intravenous contrast  Contrast: 15mL MULTIHANCE GADOBENATE DIMEGLUMINE 529 MG/ML IV SOLN  Comparison: Preoperative MR 03/24/2013.  Findings: The patient is status post left parietal craniotomy and subtotal decompression of a grade IV malignant astrocytoma/ glioblastoma multiforme.  There is an expected  degree of postoperative hemorrhage centrally. This does not exert mass effect on the surrounding structures.  Diffusion weighted images demonstrate some restricted diffusion surrounding the area of surgery.  The tumor did not display significant restricted diffusion preoperatively.  The findings could reflect some degree of peripheral ischemia surrounding brain postoperatively, possibly related to venous infarction.  There is significant debulking of the central enhancing portion of the tumor as compared with the preoperative exam.  Post infusion imaging demonstrates peripheral enhancement along the deep margins of the tumor bed.  This enhancement is somewhat thin superiorly and inferiorly, but is more nodular medially measuring up to 6 mm thickness ( image 8 series 11).  Residual tumor is suspected.  This enhancement is surrounded by moderate vasogenic edema which extends to the ependymal lining of the lateral ventricle.  There is no significant midline shift.  There is no hydrocephalus or uncal herniation.  Mild meningeal enhancement related to recent craniotomy.  Minimal 6 mm extra-axial fluid collection at the surgical site is a not unusual postoperative finding.  2 mm subdural collection over the left hemisphere is also postoperative in nature, non compressive.  The major intracranial vascular structures are patent.  Clear sinuses and mastoids.  Negative orbits.  IMPRESSION: Status post left parietal craniotomy and debulking of a malignant glioma. Post contrast enhancement is most bulky medially and is surrounded by moderate edema.  Residual tumor is likely.  Foci of restricted diffusion not present preoperatively surround the area of surgical debulking; some degree of peripheral ischemia/infarction following tumor debulking is not excluded.   Original Report Authenticated By: Davonna Belling, M.D.     Assessment and Plan: 57 year old gentleman with new diagnosis of glioblastoma WHO grade 4. He is status post  biopsy debulking of the left parietal GBM. Postoperatively he is doing well. He is now seen for discussion of treatment with concurrent Temodar and radiation therapy followed by adjuvant Temodar and possibly addition of Avastin. We discussed the rationale for concurrent treatment with chemotherapy and radiation therapy we discussed the side effects risks and benefits. They understood this. Prescription for Temodar was given to our preauthorization department. His initial dose with radiation will be 75 mg per meter squared on a daily basis  for the course of the radiation including the weekends. I explained this to them very carefully. Once patient completes radiation then she will go on to receive adjuvant Temodar at 175 mg per meter squared days one through 5 on a 28 guide cycle for about 6 months. All of this again was very carefully explained to them. He was given prescriptions for Xanax to help with sleep at night and relieve anxiety. He was also given a prescription for Zofran to be taken half hour prior to his Temodar. I also instructed him to take the evening dose of Decadron a little bit earlier so that he does not suffer from insomnia and the side effects of Decadron.  I will plan on seeing him back in about 2 weeks time or sooner if need arises.  Marcy Panning, MD Medical/Oncology [facility] [phone] (beeper) [phone] (Office)  04/24/2013, 6:21 PM

## 2013-04-24 NOTE — Telephone Encounter (Signed)
appts made and printed...td 

## 2013-04-24 NOTE — Progress Notes (Signed)
Per MD, prescription for Temodar 140 mg and 5 mg PO,  given to Martin Martin in managed care for preauthorization.

## 2013-04-24 NOTE — Patient Instructions (Addendum)
Proceed with radiation therapy and concurrent temodar.  You will start taking temodar 1 capsule of 140 mg and 2 capsules of 5 mg to equal 150 mg daily prior to radiation. Take temodar everyday including weekends  Take zofran half hour prior to temodar to help prevent nausea  Take Bactrim 1 tab daily  Xanax as need for sleep  I will see you back on 5/19 at 3:00 with labs prior to the visit at 2:30

## 2013-04-24 NOTE — Progress Notes (Signed)
Checked in new pt with no financial concerns. °

## 2013-04-25 ENCOUNTER — Telehealth: Payer: Self-pay | Admitting: Medical Oncology

## 2013-04-25 ENCOUNTER — Ambulatory Visit: Payer: BC Managed Care – PPO | Admitting: Radiation Oncology

## 2013-04-25 NOTE — Telephone Encounter (Signed)
Velna Hatchet with [employer] LVMOM requesting pts height, weight and total dose of temodar.  Returned call and LVMOM with Velna Hatchet requested information as in pt's med rec from office visit 05/05 as well as per MD notes for pt to take Temodar 1 capsule of 140 mg and 2 capsules of 5 mg to equal 150 mg daily prior to radiation, to take Temodar everyday including weekends.  Velna Hatchet to call office with any questions or concerns.

## 2013-04-26 ENCOUNTER — Ambulatory Visit: Payer: BC Managed Care – PPO

## 2013-04-26 ENCOUNTER — Telehealth: Payer: Self-pay | Admitting: Medical Oncology

## 2013-04-26 ENCOUNTER — Ambulatory Visit
Admission: RE | Admit: 2013-04-26 | Discharge: 2013-04-26 | Disposition: A | Discharge status: Home / Self Care | Payer: BC Managed Care – PPO | Source: Ambulatory Visit | Attending: Radiation Oncology | Admitting: Radiation Oncology

## 2013-04-26 NOTE — Telephone Encounter (Signed)
Pt's wife called to ask and inform Dr Welton Flakes that pt's temodar is scheduled to arrive today sometime after lunch, wife asking when pt should take temodar, today is pt's first radiation tx at 4pm.   LVMOM, per MD, informed wife that should temodar arrive after lunch pt can take before radiation tx, on an empty stomach with a full glass of water and that medication to be taken in mornings on empty stomach with a full glass of water. Patient/Spouse to call office with questions or concerns.

## 2013-04-26 NOTE — Telephone Encounter (Signed)
[facility] faxed confirmation of prescription shipment.  Temodar was shipped 04-25-2013 with next business day delivery.

## 2013-04-27 ENCOUNTER — Ambulatory Visit
Admission: RE | Admit: 2013-04-27 | Discharge: 2013-04-27 | Disposition: A | Discharge status: Home / Self Care | Payer: BC Managed Care – PPO | Source: Ambulatory Visit | Attending: Radiation Oncology | Admitting: Radiation Oncology

## 2013-04-28 ENCOUNTER — Encounter: Payer: Self-pay | Admitting: Radiation Oncology

## 2013-04-28 ENCOUNTER — Ambulatory Visit
Admission: RE | Admit: 2013-04-28 | Discharge: 2013-04-28 | Disposition: A | Discharge status: Home / Self Care | Payer: BC Managed Care – PPO | Source: Ambulatory Visit | Attending: Radiation Oncology | Admitting: Radiation Oncology

## 2013-04-28 VITALS — BP 143/83 | HR 86 | Resp 18 | Wt 178.0 lb

## 2013-04-28 DIAGNOSIS — C713 Malignant neoplasm of parietal lobe: Secondary | ICD-10-CM

## 2013-04-28 NOTE — Progress Notes (Signed)
Patient presents to the clinic today accompanied by his wife for PUT with Dr. Kathrynn Running. Patient alert and oriented to person,place, and time. No distress noted. Steady gait noted. Pleasant affect noted. Patient denies pain at this time. Patient reports taking temodar and keppra as directed. Patient reports taking DECADRON 2 MG BID. Patient denies headaches, dizziness, nausea, vomiting, ringing in the ears or double vision. Patient reports he has a gigantic appetite. Patient report indigestion. Patient reports difficulty sleeping. Reported all findings to Dr. Kathrynn Running.

## 2013-04-29 ENCOUNTER — Ambulatory Visit
Admission: RE | Admit: 2013-04-29 | Discharge: 2013-04-29 | Disposition: A | Discharge status: Home / Self Care | Payer: BC Managed Care – PPO | Source: Ambulatory Visit | Attending: Radiation Oncology | Admitting: Radiation Oncology

## 2013-04-30 ENCOUNTER — Encounter: Payer: Self-pay | Admitting: Radiation Oncology

## 2013-04-30 NOTE — Progress Notes (Signed)
  Radiation Oncology         [phone] ________________________________  Name: Martin Martin MRN: [id number]  Date: 04/28/2013  DOB: 09-11-1956  Weekly Radiation Therapy Management  Current Dose: 6 Gy     Planned Dose:  60 Gy  Narrative . . . . . . . . The patient presents for routine under treatment assessment.                                                      The patient is without complaint.                                 Set-up films were reviewed.                                 The chart was checked. Physical Findings. . .  weight is 178 lb (80.74 kg). His blood pressure is 143/83 and his pulse is 86. His respiration is 18. . Weight essentially stable.  No significant changes. Impression . . . . . . . The patient is  tolerating radiation. Plan . . . . . . . . . . . . Continue treatment as planned.  ________________________________  Artist Pais. Kathrynn Running, M.D.

## 2013-05-01 ENCOUNTER — Ambulatory Visit
Admission: RE | Admit: 2013-05-01 | Discharge: 2013-05-01 | Disposition: A | Discharge status: Home / Self Care | Payer: BC Managed Care – PPO | Source: Ambulatory Visit | Attending: Radiation Oncology | Admitting: Radiation Oncology

## 2013-05-02 ENCOUNTER — Ambulatory Visit
Admission: RE | Admit: 2013-05-02 | Discharge: 2013-05-02 | Disposition: A | Discharge status: Home / Self Care | Payer: BC Managed Care – PPO | Source: Ambulatory Visit | Attending: Radiation Oncology | Admitting: Radiation Oncology

## 2013-05-03 ENCOUNTER — Ambulatory Visit
Admission: RE | Admit: 2013-05-03 | Discharge: 2013-05-03 | Disposition: A | Discharge status: Home / Self Care | Payer: BC Managed Care – PPO | Source: Ambulatory Visit | Attending: Radiation Oncology | Admitting: Radiation Oncology

## 2013-05-03 ENCOUNTER — Telehealth: Payer: Self-pay | Admitting: Radiation Oncology

## 2013-05-03 DIAGNOSIS — C713 Malignant neoplasm of parietal lobe: Secondary | ICD-10-CM

## 2013-05-03 NOTE — Telephone Encounter (Signed)
Per Dr. Broadus John order phoned into [place], pharmacist, at [facility], Protonix 40 mg one tablet each day, dispense 30 tablets, no refills at this time. Informed patient's wife as she sat in the lobby of this action. Also, requested her husband present early tomorrow at 1500 for lab draw. She verbalized understanding.

## 2013-05-04 ENCOUNTER — Ambulatory Visit
Admission: RE | Admit: 2013-05-04 | Discharge: 2013-05-04 | Disposition: A | Discharge status: Home / Self Care | Payer: BC Managed Care – PPO | Source: Ambulatory Visit | Attending: Radiation Oncology | Admitting: Radiation Oncology

## 2013-05-04 DIAGNOSIS — C713 Malignant neoplasm of parietal lobe: Secondary | ICD-10-CM | POA: Insufficient documentation

## 2013-05-04 LAB — COMPREHENSIVE METABOLIC PANEL (CC13)
AST: 25 U/L (ref 5–34)
Albumin: 3 g/dL — ABNORMAL LOW (ref 3.5–5.0)
BUN: 22.7 mg/dL (ref 7.0–26.0)
Calcium: 8.8 mg/dL (ref 8.4–10.4)
Chloride: 101 mEq/L (ref 98–107)
Potassium: 4.4 mEq/L (ref 3.5–5.1)

## 2013-05-05 ENCOUNTER — Ambulatory Visit
Admission: RE | Admit: 2013-05-05 | Discharge: 2013-05-05 | Disposition: A | Discharge status: Home / Self Care | Payer: BC Managed Care – PPO | Source: Ambulatory Visit | Attending: Radiation Oncology | Admitting: Radiation Oncology

## 2013-05-05 ENCOUNTER — Encounter: Payer: Self-pay | Admitting: Radiation Oncology

## 2013-05-05 VITALS — BP 140/82 | HR 70 | Resp 18 | Wt 178.5 lb

## 2013-05-05 DIAGNOSIS — C713 Malignant neoplasm of parietal lobe: Secondary | ICD-10-CM

## 2013-05-05 MED ORDER — DEXAMETHASONE 4 MG PO TABS: 2.0000 mg | ORAL_TABLET | Freq: Every day | ORAL | Status: DC

## 2013-05-05 NOTE — Progress Notes (Signed)
Patient presents to the clinic today accompanied by his wife for PUT with Dr. Kathrynn Running. Patient alert and oriented to person, place, and time. No distress noted. Steady gait noted. Pleasant affect noted. Patient denies pain at this time. Patient denies headache or seizure activity. Patient denies hearing or vision changes. Patient denies nausea or vomiting. Patient denies fatigue. Patient denies indigestion today since beginning protonix last night. Patient reports taking decadron 2 mg each morning and each night. Patient reports a great appetite. Patient continues to be concerned about cramping in both hands x1 year. Reported all findings to Dr. Kathrynn Running.

## 2013-05-05 NOTE — Progress Notes (Signed)
  Radiation Oncology         [phone]) [phone] ________________________________  Name: Martin Martin MRN: [id number]  Date: 05/05/2013  DOB: 09-15-1956  Weekly Radiation Therapy Management  Current Dose: 16 Gy     Planned Dose:  60 Gy  Narrative . . . . . . . . The patient presents for routine under treatment assessment.                                               Patient denies pain at this time. Patient denies headache or seizure activity. Patient denies hearing or vision changes. Patient denies nausea or vomiting. Patient denies fatigue. Patient denies indigestion today since beginning protonix last night. Patient reports taking decadron 2 mg each morning and each night. Patient reports a great appetite. Patient continues to be concerned about cramping in both hands x1 year.                                 Set-up films were reviewed.                                 The chart was checked. Physical Findings. Marland Kitchen .Patient alert and oriented to person, place, and time. No distress noted. Steady gait noted. Pleasant affect noted.    weight is 178 lb 8 oz (80.967 kg). His blood pressure is 140/82 and his pulse is 70. His respiration is 18. . Weight essentially stable.  No significant changes. Impression . . . . . . . The patient is  tolerating radiation. Plan . . . . . . . . . . . . Continue treatment as planned.  ________________________________  Artist Pais. Kathrynn Running, M.D.

## 2013-05-08 ENCOUNTER — Encounter: Payer: Self-pay | Admitting: Oncology

## 2013-05-08 ENCOUNTER — Other Ambulatory Visit (HOSPITAL_BASED_OUTPATIENT_CLINIC_OR_DEPARTMENT_OTHER): Payer: BC Managed Care – PPO | Admitting: Lab

## 2013-05-08 ENCOUNTER — Telehealth: Payer: Self-pay | Admitting: Oncology

## 2013-05-08 ENCOUNTER — Ambulatory Visit (HOSPITAL_BASED_OUTPATIENT_CLINIC_OR_DEPARTMENT_OTHER): Payer: BC Managed Care – PPO | Admitting: Oncology

## 2013-05-08 ENCOUNTER — Ambulatory Visit
Admission: RE | Admit: 2013-05-08 | Discharge: 2013-05-08 | Disposition: A | Discharge status: Home / Self Care | Payer: BC Managed Care – PPO | Source: Ambulatory Visit | Attending: Radiation Oncology | Admitting: Radiation Oncology

## 2013-05-08 VITALS — BP 133/73 | HR 87 | Temp 98.7°F | Resp 20 | Ht 72.0 in | Wt 178.8 lb

## 2013-05-08 DIAGNOSIS — C713 Malignant neoplasm of parietal lobe: Secondary | ICD-10-CM

## 2013-05-08 DIAGNOSIS — R0602 Shortness of breath: Secondary | ICD-10-CM

## 2013-05-08 LAB — CBC WITH DIFFERENTIAL/PLATELET
BASO%: 0.3 % (ref 0.0–2.0)
EOS%: 0.1 % (ref 0.0–7.0)
Eosinophils Absolute: 0 10*3/uL (ref 0.0–0.5)
MCH: 32 pg (ref 27.2–33.4)
MCV: 95.7 fL (ref 79.3–98.0)
MONO%: 7.2 % (ref 0.0–14.0)
NEUT#: 8.1 10*3/uL — ABNORMAL HIGH (ref 1.5–6.5)
RBC: 3.97 10*6/uL — ABNORMAL LOW (ref 4.20–5.82)
RDW: 16.7 % — ABNORMAL HIGH (ref 11.0–14.6)
nRBC: 0 % (ref 0–0)

## 2013-05-08 LAB — COMPREHENSIVE METABOLIC PANEL (CC13)
ALT: 72 U/L — ABNORMAL HIGH (ref 0–55)
AST: 22 U/L (ref 5–34)
Alkaline Phosphatase: 58 U/L (ref 40–150)
Sodium: 140 mEq/L (ref 136–145)
Total Bilirubin: 0.27 mg/dL (ref 0.20–1.20)
Total Protein: 6.3 g/dL — ABNORMAL LOW (ref 6.4–8.3)

## 2013-05-08 NOTE — Patient Instructions (Addendum)
Continue temodar as directed  I will see you back on 05/22/13

## 2013-05-08 NOTE — Progress Notes (Signed)
OFFICE PROGRESS NOTE  CC  [name], MD [address] [place] [address] Dr Eliane Decree Dr. Margaretmary Dys  DIAGNOSIS: 57 year old gentleman with glioblastoma of the left parietal brain status post biopsy/debulking in April 2014  PRIOR THERAPY:  #1presented on 03/24/2013 with increasing severe disorientation and confusion. Because of this he was seen in the urgent care. He was noted to have some ataxia as well as confusion and speech difficulty with visual disturbances as well. He had a CT of the head without contrast that revealed 6.4 cm area of low attenuation with surrounding vasogenic edema in the left parietal lobe. An MRI performed showed a large partially necrotic 36 x 47 x 39 mm lesion with surrounding vasogenic edema. CT of the chest and abdomen showed no primary tumor. Patient went on to have left parietal brain biopsy and resection that showed WHO grade 4 glioblastoma. Postop brain MRI showed subtotal decompression with some mild postoperative hemorrhage centrally with vasogenic edema had resolved. He has been seen by Dr. Margaretmary Dys for radiation treatment and he is now seeing me for discussion regarding concurrent and adjuvant chemotherapy for glioblastoma  #2 has begun concurrent radiation therapy with Temodar with Temodar dose at 75 mg per meter square daily. He is also taking Bactrim DS on a daily basis. Thus far tolerating it well  CURRENT THERAPY:Temodar concurrently with radiation therapy  INTERVAL HISTORY: Martin Martin 57 y.o. male returns for followup visit. He has been receiving radiation for about 2 weeks now. He seems to be doing well. He started the Temodar when he received in the mail from [facility]. He is denying any fevers chills night sweats headaches no shortness of breath no chest pains no cough or hemoptysis. He is a little bit weak he does sometimes get shaky. He is being tapered off of his steroids but slowly. He denies any nausea or  vomiting. Remainder of the 10 point review of systems is negative.  MEDICAL HISTORY: Past Medical History  Diagnosis Date  . Hyperlipidemia   . Right bundle branch block     Normal LV function normal aortic and mitral valve bedside echocardiogram 4 2012  . Tachycardia     Resolved  . Dyslipidemia   . Coronary artery disease (CAD) excluded     Cardiolite study 2006 within normal limits  . Hypertension   . Allergy     ALLERGIES:  is allergic to dilaudid and morphine and related.  MEDICATIONS:  Current Outpatient Prescriptions  Medication Sig Dispense Refill  . ALPRAZolam (XANAX) 0.25 MG tablet Take 1 tablet (0.25 mg total) by mouth at bedtime as needed for sleep.  30 tablet  0  . calcium carbonate (TUMS - DOSED IN MG ELEMENTAL CALCIUM) 500 MG chewable tablet Chew 1 tablet by mouth daily. For indigestion      . dexamethasone (DECADRON) 4 MG tablet Take 0.5 tablets (2 mg total) by mouth daily.  15 tablet  6  . diltiazem (CARDIZEM CD) 120 MG 24 hr capsule Take 1 capsule (120 mg total) by mouth daily.  90 capsule  3  . levETIRAcetam (KEPPRA) 500 MG tablet Take 500 mg by mouth at bedtime. Changed by Dr. Jeral Fruit 04/11/13      . metoprolol tartrate (LOPRESSOR) 25 MG tablet Take 25 mg by mouth 2 (two) times daily.       . ondansetron (ZOFRAN) 8 MG tablet Take 1/2 hour prior to temodar daily  30 tablet  3  . pantoprazole (PROTONIX) 20 MG  tablet Take 20 mg by mouth daily.      Marland Kitchen sulfamethoxazole-trimethoprim (BACTRIM DS,SEPTRA DS) 800-160 MG per tablet Take 1 pill daily  30 tablet  5  . temozolomide (TEMODAR) 140 MG capsule Take 1 capsule (140 mg total) by mouth daily. May take on an empty stomach or at bedtime to decrease nausea & vomiting.  30 capsule  2  . temozolomide (TEMODAR) 5 MG capsule Take 2 capsules daily  60 capsule  3   No current facility-administered medications for this visit.    SURGICAL HISTORY:  Past Surgical History  Procedure Laterality Date  . Left inguinal hernia.   2004  . Craniotomy Left 03/29/2013    Procedure: CRANIOTOMY TUMOR EXCISION;  Surgeon: Karn Cassis, MD;  Location: [facility];  Service: Neurosurgery;  Laterality: Left;  Left Craniotomy for tumor excision    REVIEW OF SYSTEMS:  Pertinent items are noted in HPI.   HEALTH MAINTENANCE:  PHYSICAL EXAMINATION: Blood pressure 133/73, pulse 87, temperature 98.7 F (37.1 C), temperature source Oral, resp. rate 20, height 6' (1.829 m), weight 178 lb 12.8 oz (81.103 kg). Body mass index is 24.24 kg/(m^2). ECOG PERFORMANCE STATUS: 1 - Symptomatic but completely ambulatory   General appearance: alert, cooperative and appears stated age Resp: clear to auscultation bilaterally Cardio: regular rate and rhythm GI: soft, non-tender; bowel sounds normal; no masses,  no organomegaly Extremities: extremities normal, atraumatic, no cyanosis or edema Neurologic: Grossly normal   LABORATORY DATA: Lab Results  Component Value Date   WBC 10.1 05/08/2013   HGB 12.7* 05/08/2013   HCT 38.0* 05/08/2013   MCV 95.7 05/08/2013   PLT 231 05/08/2013      Chemistry      Component Value Date/Time   NA 140 05/08/2013 1354   NA 136 03/30/2013 0430   K 4.6 05/08/2013 1354   K 4.4 03/30/2013 0430   CL 104 05/08/2013 1354   CL 100 03/30/2013 0430   CO2 25 05/08/2013 1354   CO2 29 03/30/2013 0430   BUN 23.3 05/08/2013 1354   BUN 21 03/30/2013 0430   CREATININE 1.0 05/08/2013 1354   CREATININE 0.81 03/30/2013 0430      Component Value Date/Time   CALCIUM 8.6 05/08/2013 1354   CALCIUM 8.5 03/30/2013 0430   ALKPHOS 58 05/08/2013 1354   ALKPHOS 50 03/29/2013 0500   AST 22 05/08/2013 1354   AST 19 03/29/2013 0500   ALT 72* 05/08/2013 1354   ALT 101* 03/29/2013 0500   BILITOT 0.27 05/08/2013 1354   BILITOT 0.5 03/29/2013 0500       RADIOGRAPHIC STUDIES:  No results found.  ASSESSMENT: 57 year old gentleman with  #1 WHO grade 4 glioblastoma status post debulking. Now receiving concurrent chemotherapy and radiation  therapy. Chemotherapy consisting of Temodar being given at 75 mg per meter squared with radiation. Overall he seems to be tolerating it well. Today his blood counts look terrific.   PLAN:   #1 continue Temodar at a dose of total 150 mg daily.  #2 he will be seen back in 2-3 weeks' time for followup.   All questions were answered. The patient knows to call the clinic with any problems, questions or concerns. We can certainly see the patient much sooner if necessary.  I spent 15 minutes counseling the patient face to face. The total time spent in the appointment was 30 minutes.    Drue Second, MD Medical/Oncology [facility] [phone] (beeper) [phone] (Office)  05/08/2013, 2:36 PM

## 2013-05-09 ENCOUNTER — Ambulatory Visit: Payer: BC Managed Care – PPO

## 2013-05-09 ENCOUNTER — Ambulatory Visit
Admission: RE | Admit: 2013-05-09 | Discharge: 2013-05-09 | Disposition: A | Discharge status: Home / Self Care | Payer: BC Managed Care – PPO | Source: Ambulatory Visit | Attending: Radiation Oncology | Admitting: Radiation Oncology

## 2013-05-10 ENCOUNTER — Ambulatory Visit
Admission: RE | Admit: 2013-05-10 | Discharge: 2013-05-10 | Disposition: A | Discharge status: Home / Self Care | Payer: BC Managed Care – PPO | Source: Ambulatory Visit | Attending: Radiation Oncology | Admitting: Radiation Oncology

## 2013-05-11 ENCOUNTER — Ambulatory Visit
Admission: RE | Admit: 2013-05-11 | Discharge: 2013-05-11 | Disposition: A | Discharge status: Home / Self Care | Payer: BC Managed Care – PPO | Source: Ambulatory Visit | Attending: Radiation Oncology | Admitting: Radiation Oncology

## 2013-05-12 ENCOUNTER — Ambulatory Visit
Admission: RE | Admit: 2013-05-12 | Payer: BC Managed Care – PPO | Source: Ambulatory Visit | Admitting: Radiation Oncology

## 2013-05-12 ENCOUNTER — Encounter: Payer: Self-pay | Admitting: Radiation Oncology

## 2013-05-12 ENCOUNTER — Ambulatory Visit
Admission: RE | Admit: 2013-05-12 | Discharge: 2013-05-12 | Disposition: A | Discharge status: Home / Self Care | Payer: BC Managed Care – PPO | Source: Ambulatory Visit | Attending: Radiation Oncology | Admitting: Radiation Oncology

## 2013-05-12 VITALS — BP 133/77 | HR 102 | Resp 18 | Wt 181.9 lb

## 2013-05-12 DIAGNOSIS — C713 Malignant neoplasm of parietal lobe: Secondary | ICD-10-CM

## 2013-05-12 NOTE — Progress Notes (Signed)
Patient presents to the clinic today accompanied by his sister for PUT with Dr. Kathrynn Running. Patient alert and oriented to person, place, and time. No distress noted. Steady gait noted. Pleasant affect noted. Patient denies pain at this time. Patient taking decadron 2 mg once per day. Patient taking 500 mg keppra at bedtime. Patient reports taking temodar as directed. Patient denies headache, dizziness, nausea, or vomiting. Patient denies ringing in the ears but, reports yesterday he felt pressure in his ears following mowing and weed eating therefore patient relates this pressure to allergies. Hyperpigmentation of scalp at treatment site noted. Reported all findings to Dr. Kathrynn Running.

## 2013-05-15 ENCOUNTER — Encounter: Payer: Self-pay | Admitting: Radiation Oncology

## 2013-05-15 NOTE — Progress Notes (Signed)
  Radiation Oncology         (336) [phone] ________________________________  Name: Martin Martin MRN: [id number]  Date: 05/12/2013  DOB: [birth year]  Weekly Radiation Therapy Management  Current Dose: 26 Gy     Planned Dose:  60 Gy  Narrative . . . . . . . . Patient presents to the clinic today accompanied by his sister for PUT with Dr. Kathrynn Running. Patient alert and oriented to person, place, and time. No distress noted. Steady gait noted. Pleasant affect noted. Patient denies pain at this time. Patient taking decadron 2 mg once per day. Patient taking 500 mg keppra at bedtime. Patient reports taking temodar as directed. Patient denies headache, dizziness, nausea, or vomiting. Patient denies ringing in the ears but, reports yesterday he felt pressure in his ears following mowing and weed eating therefore patient relates this pressure to allergies. Hyperpigmentation of scalp at treatment site noted                                 Set-up films were reviewed.                                 The chart was checked. Physical Findings. . .  weight is 181 lb 14.4 oz (82.509 kg). His blood pressure is 133/77 and his pulse is 102. His respiration is 18. . Weight essentially stable. No significant changes. Impression . . . . . . . The patient is  tolerating radiation. Plan . . . . . . . . . . . . Continue treatment as planned.  ________________________________  Artist Pais. Kathrynn Running, M.D.

## 2013-05-16 ENCOUNTER — Ambulatory Visit
Admission: RE | Admit: 2013-05-16 | Discharge: 2013-05-16 | Disposition: A | Discharge status: Home / Self Care | Payer: BC Managed Care – PPO | Source: Ambulatory Visit | Attending: Radiation Oncology | Admitting: Radiation Oncology

## 2013-05-17 ENCOUNTER — Ambulatory Visit
Admission: RE | Admit: 2013-05-17 | Discharge: 2013-05-17 | Disposition: A | Discharge status: Home / Self Care | Payer: BC Managed Care – PPO | Source: Ambulatory Visit | Attending: Radiation Oncology | Admitting: Radiation Oncology

## 2013-05-18 ENCOUNTER — Ambulatory Visit
Admission: RE | Admit: 2013-05-18 | Discharge: 2013-05-18 | Disposition: A | Discharge status: Home / Self Care | Payer: BC Managed Care – PPO | Source: Ambulatory Visit | Attending: Radiation Oncology | Admitting: Radiation Oncology

## 2013-05-19 ENCOUNTER — Ambulatory Visit
Admission: RE | Admit: 2013-05-19 | Discharge: 2013-05-19 | Disposition: A | Discharge status: Home / Self Care | Payer: BC Managed Care – PPO | Source: Ambulatory Visit | Attending: Radiation Oncology | Admitting: Radiation Oncology

## 2013-05-19 ENCOUNTER — Encounter: Payer: Self-pay | Admitting: Radiation Oncology

## 2013-05-19 ENCOUNTER — Encounter: Payer: Self-pay | Admitting: *Deleted

## 2013-05-19 VITALS — BP 139/75 | HR 76 | Resp 18 | Wt 184.8 lb

## 2013-05-19 DIAGNOSIS — C713 Malignant neoplasm of parietal lobe: Secondary | ICD-10-CM

## 2013-05-19 NOTE — Progress Notes (Signed)
RECEIVED A FAX FROM [facility] CONCERNING A CONFIRMATION OF PRESCRIPTION SHIPMENT FOR TEMOZOLOMIDE ON 05/18/13.

## 2013-05-19 NOTE — Progress Notes (Signed)
  Radiation Oncology         [phone]) [phone] ________________________________  Name: Martin Martin MRN: [id number]  Date: 05/19/2013  DOB: 1956-09-17  Weekly Radiation Therapy Management  Current Dose: 32 Gy     Planned Dose:  60 Gy  Narrative . . . . . . . . The patient presents for routine under treatment assessment.                                                      The patient is without complaint.                                 Set-up films were reviewed.                                 The chart was checked. Physical Findings. . .  weight is 184 lb 12.8 oz (83.825 kg). His blood pressure is 139/75 and his pulse is 76. His respiration is 18. . Weight essentially stable.  No significant changes. Impression . . . . . . . The patient is  tolerating radiation. Plan . . . . . . . . . . . . Continue treatment as planned.  Stopped dexamethasone  ________________________________  Artist Pais Kathrynn Running, M.D.

## 2013-05-19 NOTE — Progress Notes (Signed)
Patient continues to take decadron 2 mg once per day, keppra once per day, and temodar as directed. Patient denies nausea, vomiting, headache, dizziness, or diplopia. Patient denies ringing in the ears but, does report occasional roaring in the left ear. Only faint hyperpigmentation of left parietal area noted without desquamation. Patient denies pain.

## 2013-05-22 ENCOUNTER — Ambulatory Visit
Admission: RE | Admit: 2013-05-22 | Discharge: 2013-05-22 | Disposition: A | Discharge status: Home / Self Care | Payer: BC Managed Care – PPO | Source: Ambulatory Visit | Attending: Radiation Oncology | Admitting: Radiation Oncology

## 2013-05-22 ENCOUNTER — Telehealth: Payer: Self-pay | Admitting: Radiation Oncology

## 2013-05-22 ENCOUNTER — Encounter: Payer: Self-pay | Admitting: Adult Health

## 2013-05-22 ENCOUNTER — Ambulatory Visit (HOSPITAL_BASED_OUTPATIENT_CLINIC_OR_DEPARTMENT_OTHER): Payer: BC Managed Care – PPO | Admitting: Adult Health

## 2013-05-22 ENCOUNTER — Encounter: Payer: Self-pay | Admitting: Radiation Oncology

## 2013-05-22 ENCOUNTER — Telehealth: Payer: Self-pay | Admitting: Oncology

## 2013-05-22 ENCOUNTER — Other Ambulatory Visit (HOSPITAL_BASED_OUTPATIENT_CLINIC_OR_DEPARTMENT_OTHER): Payer: BC Managed Care – PPO

## 2013-05-22 VITALS — BP 124/80 | HR 97 | Temp 98.6°F | Resp 20 | Ht 72.0 in | Wt 181.0 lb

## 2013-05-22 VITALS — BP 151/92 | HR 101 | Resp 18 | Wt 181.0 lb

## 2013-05-22 DIAGNOSIS — C713 Malignant neoplasm of parietal lobe: Secondary | ICD-10-CM

## 2013-05-22 DIAGNOSIS — R569 Unspecified convulsions: Secondary | ICD-10-CM

## 2013-05-22 LAB — COMPREHENSIVE METABOLIC PANEL (CC13)
ALT: 43 U/L (ref 0–55)
BUN: 18.6 mg/dL (ref 7.0–26.0)
CO2: 30 mEq/L — ABNORMAL HIGH (ref 22–29)
Creatinine: 0.9 mg/dL (ref 0.7–1.3)
Total Bilirubin: 0.83 mg/dL (ref 0.20–1.20)

## 2013-05-22 LAB — CBC WITH DIFFERENTIAL/PLATELET
BASO%: 0.7 % (ref 0.0–2.0)
EOS%: 0.8 % (ref 0.0–7.0)
HCT: 40.8 % (ref 38.4–49.9)
LYMPH%: 11.6 % — ABNORMAL LOW (ref 14.0–49.0)
MCH: 32.3 pg (ref 27.2–33.4)
MCHC: 34 g/dL (ref 32.0–36.0)
MONO#: 1 10*3/uL — ABNORMAL HIGH (ref 0.1–0.9)
NEUT%: 76.6 % — ABNORMAL HIGH (ref 39.0–75.0)
Platelets: 270 10*3/uL (ref 140–400)

## 2013-05-22 MED ORDER — DEXAMETHASONE 4 MG PO TABS: 4.0000 mg | ORAL_TABLET | ORAL | Status: DC

## 2013-05-22 NOTE — Progress Notes (Signed)
OFFICE PROGRESS NOTE  CC  Martin Heap, MD [address] [place] [address] Dr Eliane Decree Dr. Margaretmary Dys  DIAGNOSIS: 57 year old gentleman with glioblastoma of the left parietal brain status post biopsy/debulking in April 2014  PRIOR THERAPY:  #1presented on 03/24/2013 with increasing severe disorientation and confusion. Because of this he was seen in the urgent care. He was noted to have some ataxia as well as confusion and speech difficulty with visual disturbances as well. He had a CT of the head without contrast that revealed 6.4 cm area of low attenuation with surrounding vasogenic edema in the left parietal lobe. An MRI performed showed a large partially necrotic 36 x 47 x 39 mm lesion with surrounding vasogenic edema. CT of the chest and abdomen showed no primary tumor. Patient went on to have left parietal brain biopsy and resection that showed WHO grade 4 glioblastoma. Postop brain MRI showed subtotal decompression with some mild postoperative hemorrhage centrally with vasogenic edema had resolved. He has been seen by Dr. Margaretmary Dys for radiation treatment and he is now seeing me for discussion regarding concurrent and adjuvant chemotherapy for glioblastoma  #2 has begun concurrent radiation therapy with Temodar with Temodar dose at 75 mg per meter square daily. He is also taking Bactrim DS on a daily basis. Thus far tolerating it well  CURRENT THERAPY:Temodar concurrently with radiation therapy  INTERVAL HISTORY: Martin Martin 57 y.o. male returns for followup visit. He recently was tapered off of Dexamethasone, and he has been feeling nauseated since then.  He also has experienced a decrease in his appetite.  He also has mild neck pain.  Otherwise, he denies fevers, chills, vomiting, constipation, diarrhea, numbness, weakness, headaches, or any further concerns.   MEDICAL HISTORY: Past Medical History  Diagnosis Date  . Hyperlipidemia   . Right bundle  branch block     Normal LV function normal aortic and mitral valve bedside echocardiogram 4 2012  . Tachycardia     Resolved  . Dyslipidemia   . Coronary artery disease (CAD) excluded     Cardiolite study 2006 within normal limits  . Hypertension   . Allergy     ALLERGIES:  is allergic to dilaudid and morphine and related.  MEDICATIONS:  Current Outpatient Prescriptions  Medication Sig Dispense Refill  . ALPRAZolam (XANAX) 0.25 MG tablet Take 1 tablet (0.25 mg total) by mouth at bedtime as needed for sleep.  30 tablet  0  . calcium carbonate (TUMS - DOSED IN MG ELEMENTAL CALCIUM) 500 MG chewable tablet Chew 1 tablet by mouth daily. For indigestion      . diltiazem (CARDIZEM CD) 120 MG 24 hr capsule Take 1 capsule (120 mg total) by mouth daily.  90 capsule  3  . levETIRAcetam (KEPPRA) 500 MG tablet Take 500 mg by mouth at bedtime. Changed by Dr. Jeral Fruit 04/11/13      . metoprolol tartrate (LOPRESSOR) 25 MG tablet Take 25 mg by mouth 2 (two) times daily.       . ondansetron (ZOFRAN) 8 MG tablet Take 1/2 hour prior to temodar daily  30 tablet  3  . pantoprazole (PROTONIX) 20 MG tablet Take 20 mg by mouth daily.      Marland Kitchen sulfamethoxazole-trimethoprim (BACTRIM DS,SEPTRA DS) 800-160 MG per tablet Take 1 pill daily  30 tablet  5  . temozolomide (TEMODAR) 140 MG capsule Take 1 capsule (140 mg total) by mouth daily. May take on an empty stomach or at bedtime to decrease  nausea & vomiting.  30 capsule  2  . temozolomide (TEMODAR) 5 MG capsule Take 2 capsules daily  60 capsule  3   No current facility-administered medications for this visit.    SURGICAL HISTORY:  Past Surgical History  Procedure Laterality Date  . Left inguinal hernia.  2004  . Craniotomy Left 03/29/2013    Procedure: CRANIOTOMY TUMOR EXCISION;  Surgeon: Karn Cassis, MD;  Location: [facility];  Service: Neurosurgery;  Laterality: Left;  Left Craniotomy for tumor excision    REVIEW OF SYSTEMS:  Pertinent items are noted in  HPI.   PHYSICAL EXAMINATION: Blood pressure 124/80, pulse 97, temperature 98.6 F (37 C), temperature source Oral, resp. rate 20, height 6' (1.829 m), weight 181 lb (82.101 kg). Body mass index is 24.54 kg/(m^2). General: Patient is a well appearing male in no acute distress HEENT: PERRLA, sclerae anicteric no conjunctival pallor, MMM Neck: supple, no palpable adenopathy Lungs: clear to auscultation bilaterally, no wheezes, rhonchi, or rales Cardiovascular: regular rate rhythm, S1, S2, no murmurs, rubs or gallops Abdomen: Soft, non-tender, non-distended, normoactive bowel sounds, no HSM Extremities: warm and well perfused, no clubbing, cyanosis, or edema Skin: No rashes or lesions Neuro: Non-focal ECOG PERFORMANCE STATUS: 1 - Symptomatic but completely ambulatory    LABORATORY DATA: Lab Results  Component Value Date   WBC 9.7 05/22/2013   HGB 13.9 05/22/2013   HCT 40.8 05/22/2013   MCV 94.8 05/22/2013   PLT 270 05/22/2013      Chemistry      Component Value Date/Time   NA 137 05/22/2013 1225   NA 136 03/30/2013 0430   K 4.2 05/22/2013 1225   K 4.4 03/30/2013 0430   CL 98 05/22/2013 1225   CL 100 03/30/2013 0430   CO2 30* 05/22/2013 1225   CO2 29 03/30/2013 0430   BUN 18.6 05/22/2013 1225   BUN 21 03/30/2013 0430   CREATININE 0.9 05/22/2013 1225   CREATININE 0.81 03/30/2013 0430      Component Value Date/Time   CALCIUM 9.3 05/22/2013 1225   CALCIUM 8.5 03/30/2013 0430   ALKPHOS 54 05/22/2013 1225   ALKPHOS 50 03/29/2013 0500   AST 20 05/22/2013 1225   AST 19 03/29/2013 0500   ALT 43 05/22/2013 1225   ALT 101* 03/29/2013 0500   BILITOT 0.83 05/22/2013 1225   BILITOT 0.5 03/29/2013 0500       RADIOGRAPHIC STUDIES:  No results found.  ASSESSMENT: 57 year old gentleman with  #1 WHO grade 4 glioblastoma status post debulking. Now receiving concurrent chemotherapy and radiation therapy. Chemotherapy consisting of Temodar being given at 75 mg per meter squared with radiation. Overall he seems to be  tolerating it well. Today his blood counts look terrific.   PLAN:   #1 continue Temodar at a dose of total 150 mg daily.  I recommended he take Zofran in the evening as well if he continues to be nauseated.    #2 he will be seen back in 2 weeks' time for followup.   All questions were answered. The patient knows to call the clinic with any problems, questions or concerns. We can certainly see the patient much sooner if necessary.  I spent 25 minutes counseling the patient face to face. The total time spent in the appointment was 30 minutes.  Cherie Ouch Martin Hollingshead, NP Medical Oncology [facility] Phone: [phone] 05/22/2013, 1:38 PM

## 2013-05-22 NOTE — Progress Notes (Signed)
Restarted dexamethasone due to symptoms.

## 2013-05-22 NOTE — Patient Instructions (Addendum)
Doing well.  Continue Temodar.  Talk to Dr. Kathrynn Running about the nausea and neck pain.  You can take the Zofran twice a day if needed.  Please call us if you have any questions or concerns.    We will see you back in 2 weeks.

## 2013-05-22 NOTE — Telephone Encounter (Signed)
gv pt appt schedule for June.  °

## 2013-05-22 NOTE — Progress Notes (Signed)
Reports nausea, metallic taste in his mouth, and lack of appetite. Reports pain left posterior neck that he is unable to score. Reports this pain is intermittent and has been present for some time. No thrush noted. Denies burning with urination, hematuria, cloudiness of urine or hesitancy.

## 2013-05-22 NOTE — Telephone Encounter (Signed)
Patient's wife going in for open heart surgery this morning. Wife requested sister phone this writer to inform her and Dr. Kathrynn Running that the patient has been sick of his stomach the weekend, has a bad taste in his mouth, won't eat, speech is off, and has pain in the left side of his neck. Patient stopped taking decadron on Friday after speak with Dr. Kathrynn Running because it was causing him to "gain weight, be irritable, and have a moon face." Patient scheduled to see Dr. Welton Flakes this afternoon with a radiation treatment to follow. Encouraged patient's sister, Lynden Ang, to allow Dr. Welton Flakes to see the patient and get his radiation treatment. Then, requested she have him come to nursing to discuss decadron with Dr. Kathrynn Running.

## 2013-05-23 ENCOUNTER — Telehealth: Payer: Self-pay | Admitting: Radiation Oncology

## 2013-05-23 ENCOUNTER — Ambulatory Visit
Admission: RE | Admit: 2013-05-23 | Discharge: 2013-05-23 | Disposition: A | Discharge status: Home / Self Care | Payer: BC Managed Care – PPO | Source: Ambulatory Visit | Attending: Radiation Oncology | Admitting: Radiation Oncology

## 2013-05-23 NOTE — Telephone Encounter (Signed)
Received call from Talitha Givens, patient's sister. She questions decadron script. Reviewed directions again. She questions "high dose." Explained to her that her brother displays signs of swelling in the brain. Went on to explain that the decadron reduces this swelling. Explained there is no decadron in his system to reduce this swelling since he stopped taking it on Friday therefore, the 8 mg bid for two days is to achieve therapeutic dosage to reduce brain swelling. Attempted to calm other anxiety about sister in laws open heart surgery and her brother over extended himself following extensive surgery/treatment. Encouraged to call with future needs. She verbalized understanding.

## 2013-05-24 ENCOUNTER — Ambulatory Visit
Admission: RE | Admit: 2013-05-24 | Discharge: 2013-05-24 | Disposition: A | Discharge status: Home / Self Care | Payer: BC Managed Care – PPO | Source: Ambulatory Visit | Attending: Radiation Oncology | Admitting: Radiation Oncology

## 2013-05-24 ENCOUNTER — Encounter: Payer: Self-pay | Admitting: Radiation Oncology

## 2013-05-24 VITALS — BP 143/73 | HR 98 | Resp 18 | Wt 182.1 lb

## 2013-05-24 DIAGNOSIS — C713 Malignant neoplasm of parietal lobe: Secondary | ICD-10-CM

## 2013-05-24 NOTE — Progress Notes (Signed)
Decadron 8mg  each morning and night then, tomorrow 4 mg morning and night for seven days. Denies headache, dizziness, nausea, or vomiting. Denies seizure activity. Appetite has increased. Patient remains active working in her yard. Reports agititationcontinues.

## 2013-05-24 NOTE — Progress Notes (Signed)
  Radiation Oncology         [phone]) [phone] ________________________________  Name: Martin Martin MRN: [id number]  Date: 05/24/2013  DOB: 1956-04-02  Weekly Radiation Therapy Management  Current Dose: 40 Gy     Planned Dose:  60 Gy  Narrative . . . . . . . . The patient presents for routine under treatment assessment.                                                      The patient is without complaint. Resuming steroids helped his symptoms and he is feeling much better                                 Set-up films were reviewed.                                 The chart was checked. Physical Findings. . .  weight is 182 lb 1.6 oz (82.6 kg). His blood pressure is 143/73 and his pulse is 98. His respiration is 18. . Weight essentially stable.  No significant changes. Impression . . . . . . . The patient is tolerating radiation. Plan . . . . . . . . . . . . Continue treatment as planned.  ________________________________  Artist Pais. Kathrynn Running, M.D.

## 2013-05-25 ENCOUNTER — Ambulatory Visit
Admission: RE | Admit: 2013-05-25 | Discharge: 2013-05-25 | Disposition: A | Discharge status: Home / Self Care | Payer: BC Managed Care – PPO | Source: Ambulatory Visit | Attending: Radiation Oncology | Admitting: Radiation Oncology

## 2013-05-26 ENCOUNTER — Ambulatory Visit
Admission: RE | Admit: 2013-05-26 | Discharge: 2013-05-26 | Disposition: A | Discharge status: Home / Self Care | Payer: BC Managed Care – PPO | Source: Ambulatory Visit | Attending: Radiation Oncology | Admitting: Radiation Oncology

## 2013-05-29 ENCOUNTER — Ambulatory Visit
Admission: RE | Admit: 2013-05-29 | Discharge: 2013-05-29 | Disposition: A | Discharge status: Home / Self Care | Payer: BC Managed Care – PPO | Source: Ambulatory Visit | Attending: Radiation Oncology | Admitting: Radiation Oncology

## 2013-05-29 ENCOUNTER — Other Ambulatory Visit: Payer: Self-pay | Admitting: Radiation Oncology

## 2013-05-30 ENCOUNTER — Ambulatory Visit
Admission: RE | Admit: 2013-05-30 | Discharge: 2013-05-30 | Disposition: A | Discharge status: Home / Self Care | Payer: BC Managed Care – PPO | Source: Ambulatory Visit | Attending: Radiation Oncology | Admitting: Radiation Oncology

## 2013-05-31 ENCOUNTER — Ambulatory Visit
Admission: RE | Admit: 2013-05-31 | Discharge: 2013-05-31 | Disposition: A | Discharge status: Home / Self Care | Payer: BC Managed Care – PPO | Source: Ambulatory Visit | Attending: Radiation Oncology | Admitting: Radiation Oncology

## 2013-05-31 ENCOUNTER — Encounter: Payer: Self-pay | Admitting: Radiation Oncology

## 2013-05-31 VITALS — BP 147/93 | HR 76 | Resp 16 | Wt 181.5 lb

## 2013-05-31 DIAGNOSIS — C713 Malignant neoplasm of parietal lobe: Secondary | ICD-10-CM

## 2013-05-31 DIAGNOSIS — R569 Unspecified convulsions: Secondary | ICD-10-CM

## 2013-05-31 NOTE — Progress Notes (Signed)
  Radiation Oncology         [phone]) [phone] ________________________________  Name: Martin Martin MRN: [id number]  Date: 05/31/2013  DOB: Jul 08, 1956  Weekly Radiation Therapy Management  Current Dose: 50 Gy     Planned Dose:  60 Gy  Narrative . . . . . . . . The patient presents for routine under treatment assessment.                                                The patient is without complaint.  Currently, his primary concern is the health of his wife. She recently had cardiac surgery and has gone into renal failure.                                 Set-up films were reviewed.                                 The chart was checked. Physical Findings. . .  weight is 181 lb 8 oz (82.328 kg). His blood pressure is 147/93 and his pulse is 76. His respiration is 16. . Weight essentially stable.  No significant changes. Impression . . . . . . . The patient is tolerating radiation. Plan . . . . . . . . . . . . Continue treatment as planned. He tapers from 4 mg dexamethasone twice a day down to 2 mg dexamethasone twice a day today. Have encouraged him to try to cut back to 2 mg by mouth once daily Next Wednesday  ________________________________  Artist Pais. Kathrynn Running, M.D.

## 2013-05-31 NOTE — Progress Notes (Signed)
Continues to take temodar and keppra as directed. Taking decadron 4 mg one pill in the morning and one pill at night. Denies headache. Reports he is only sleeping 2 hours per night. Denies taking medication to aid in sleep. Denies nausea, vomiting, headache, diplopia or ringing in the ear. Reports a gigantic appetite but, three pound weight loss noted since last week. Wife is in critical condition following heart surgery. Patient reports that he is very upset and anxious about this. Agitation related to steroids is no better.

## 2013-06-01 ENCOUNTER — Ambulatory Visit
Admission: RE | Admit: 2013-06-01 | Discharge: 2013-06-01 | Disposition: A | Discharge status: Home / Self Care | Payer: BC Managed Care – PPO | Source: Ambulatory Visit | Attending: Radiation Oncology | Admitting: Radiation Oncology

## 2013-06-02 ENCOUNTER — Encounter: Payer: Self-pay | Admitting: Radiation Oncology

## 2013-06-02 ENCOUNTER — Ambulatory Visit
Admission: RE | Admit: 2013-06-02 | Discharge: 2013-06-02 | Disposition: A | Discharge status: Home / Self Care | Payer: BC Managed Care – PPO | Source: Ambulatory Visit | Attending: Radiation Oncology | Admitting: Radiation Oncology

## 2013-06-02 DIAGNOSIS — C713 Malignant neoplasm of parietal lobe: Secondary | ICD-10-CM

## 2013-06-02 NOTE — Progress Notes (Signed)
  Radiation Oncology         (336) [phone] ________________________________  Name: Martin Martin MRN: [id number]  Date: 06/02/2013  DOB: 04/08/1956  Weekly Radiation Therapy Management  Current Dose: 54 Gy     Planned Dose:  60 Gy  Narrative . . . . . . . . The patient presents for routine under treatment assessment.                                                      The patient is without complaint.                                 Set-up films were reviewed.                                 The chart was checked. Physical Findings. . . . Weight essentially stable.  No significant changes. Impression . . . . . . . The patient is  tolerating radiation. Plan . . . . . . . . . . . . Continue treatment as planned.  ________________________________  Artist Pais. Kathrynn Running, M.D.

## 2013-06-05 ENCOUNTER — Encounter: Payer: Self-pay | Admitting: Oncology

## 2013-06-05 ENCOUNTER — Ambulatory Visit
Admission: RE | Admit: 2013-06-05 | Discharge: 2013-06-05 | Disposition: A | Discharge status: Home / Self Care | Payer: BC Managed Care – PPO | Source: Ambulatory Visit | Admitting: Radiation Oncology

## 2013-06-05 ENCOUNTER — Ambulatory Visit (HOSPITAL_BASED_OUTPATIENT_CLINIC_OR_DEPARTMENT_OTHER): Payer: BC Managed Care – PPO | Admitting: Oncology

## 2013-06-05 ENCOUNTER — Ambulatory Visit
Admission: RE | Admit: 2013-06-05 | Discharge: 2013-06-05 | Disposition: A | Discharge status: Home / Self Care | Payer: BC Managed Care – PPO | Source: Ambulatory Visit | Attending: Radiation Oncology | Admitting: Radiation Oncology

## 2013-06-05 ENCOUNTER — Other Ambulatory Visit (HOSPITAL_BASED_OUTPATIENT_CLINIC_OR_DEPARTMENT_OTHER): Payer: BC Managed Care – PPO | Admitting: Lab

## 2013-06-05 ENCOUNTER — Telehealth: Payer: Self-pay | Admitting: Oncology

## 2013-06-05 ENCOUNTER — Other Ambulatory Visit: Payer: Self-pay | Admitting: Emergency Medicine

## 2013-06-05 ENCOUNTER — Encounter: Payer: Self-pay | Admitting: Radiation Oncology

## 2013-06-05 VITALS — BP 134/85 | HR 74 | Temp 98.3°F | Resp 20 | Wt 178.9 lb

## 2013-06-05 VITALS — BP 129/83 | HR 99 | Temp 98.0°F | Resp 20 | Ht 72.0 in | Wt 178.5 lb

## 2013-06-05 DIAGNOSIS — C713 Malignant neoplasm of parietal lobe: Secondary | ICD-10-CM

## 2013-06-05 LAB — CBC WITH DIFFERENTIAL/PLATELET
BASO%: 0.2 % (ref 0.0–2.0)
Basophils Absolute: 0 10*3/uL (ref 0.0–0.1)
Eosinophils Absolute: 0 10*3/uL (ref 0.0–0.5)
HCT: 40.9 % (ref 38.4–49.9)
HGB: 14.1 g/dL (ref 13.0–17.1)
LYMPH%: 6 % — ABNORMAL LOW (ref 14.0–49.0)
MCHC: 34.4 g/dL (ref 32.0–36.0)
MONO#: 0.8 10*3/uL (ref 0.1–0.9)
NEUT%: 84.1 % — ABNORMAL HIGH (ref 39.0–75.0)
Platelets: 219 10*3/uL (ref 140–400)
WBC: 8.2 10*3/uL (ref 4.0–10.3)

## 2013-06-05 LAB — COMPREHENSIVE METABOLIC PANEL (CC13)
BUN: 22.7 mg/dL (ref 7.0–26.0)
CO2: 22 mEq/L (ref 22–29)
Calcium: 9 mg/dL (ref 8.4–10.4)
Chloride: 102 mEq/L (ref 98–107)
Creatinine: 0.9 mg/dL (ref 0.7–1.3)
Glucose: 165 mg/dl — ABNORMAL HIGH (ref 70–99)
Total Bilirubin: 0.57 mg/dL (ref 0.20–1.20)

## 2013-06-05 LAB — TECHNOLOGIST REVIEW

## 2013-06-05 NOTE — Progress Notes (Signed)
OFFICE PROGRESS NOTE  CC  [name], MD [address] [place] [address] Dr Eliane Decree Dr. Margaretmary Dys  DIAGNOSIS: 57 year old gentleman with glioblastoma of the left parietal brain status post biopsy/debulking in April 2014  PRIOR THERAPY:  #1presented on 03/24/2013 with increasing severe disorientation and confusion. Because of this he was seen in the urgent care. He was noted to have some ataxia as well as confusion and speech difficulty with visual disturbances as well. He had a CT of the head without contrast that revealed 6.4 cm area of low attenuation with surrounding vasogenic edema in the left parietal lobe. An MRI performed showed a large partially necrotic 36 x 47 x 39 mm lesion with surrounding vasogenic edema. CT of the chest and abdomen showed no primary tumor. Patient went on to have left parietal brain biopsy and resection that showed WHO grade 4 glioblastoma. Postop brain MRI showed subtotal decompression with some mild postoperative hemorrhage centrally with vasogenic edema had resolved. He has been seen by Dr. Margaretmary Dys for radiation treatment and he is now seeing me for discussion regarding concurrent and adjuvant chemotherapy for glioblastoma  #2 has begun concurrent radiation therapy with Temodar with Temodar dose at 75 mg per meter square daily. He is also taking Bactrim DS on a daily basis. Thus far tolerating it well  CURRENT THERAPY:Temodar concurrently with radiation therapy to be completed on 06/07/2013  INTERVAL HISTORY: Martin Martin 57 y.o. male returns for followup visit. He recently was tapered off of Dexamethasone, and he has been feeling nauseated since then.  He also has experienced a decrease in his appetite.  He also has mild neck pain.  Otherwise, he denies fevers, chills, vomiting, constipation, diarrhea, numbness, weakness, headaches, or any further concerns. Unfortunately patient's wife is currently admitted in the intensive  care unit and intubated. So he is very worried about her. Otherwise he is doing well  MEDICAL HISTORY: Past Medical History  Diagnosis Date  . Hyperlipidemia   . Right bundle branch block     Normal LV function normal aortic and mitral valve bedside echocardiogram 4 2012  . Tachycardia     Resolved  . Dyslipidemia   . Coronary artery disease (CAD) excluded     Cardiolite study 2006 within normal limits  . Hypertension   . Allergy     ALLERGIES:  is allergic to dilaudid and morphine and related.  MEDICATIONS:  Current Outpatient Prescriptions  Medication Sig Dispense Refill  . ALPRAZolam (XANAX) 0.25 MG tablet Take 1 tablet (0.25 mg total) by mouth at bedtime as needed for sleep.  30 tablet  0  . dexamethasone (DECADRON) 4 MG tablet Take 1 tablet (4 mg total) by mouth as directed. Take 2 pills twice daily for 2 days then 1 pill twice daily for 1 week then 1/2 pill twice daily until further instructions  45 tablet  5  . diltiazem (CARDIZEM CD) 120 MG 24 hr capsule Take 1 capsule (120 mg total) by mouth daily.  90 capsule  3  . levETIRAcetam (KEPPRA) 500 MG tablet Take 500 mg by mouth at bedtime. Changed by Dr. Jeral Fruit 04/11/13      . metoprolol tartrate (LOPRESSOR) 25 MG tablet Take 25 mg by mouth 2 (two) times daily.       . ondansetron (ZOFRAN) 8 MG tablet Take 1/2 hour prior to temodar daily  30 tablet  3  . pantoprazole (PROTONIX) 40 MG tablet take 1 tablet by mouth once daily  30 tablet  0  . sulfamethoxazole-trimethoprim (BACTRIM DS,SEPTRA DS) 800-160 MG per tablet Take 1 pill daily  30 tablet  5  . temozolomide (TEMODAR) 140 MG capsule Take 1 capsule (140 mg total) by mouth daily. May take on an empty stomach or at bedtime to decrease nausea & vomiting.  30 capsule  2  . temozolomide (TEMODAR) 5 MG capsule Take 2 capsules daily  60 capsule  3  . calcium carbonate (TUMS - DOSED IN MG ELEMENTAL CALCIUM) 500 MG chewable tablet Chew 1 tablet by mouth daily. For indigestion       No  current facility-administered medications for this visit.    SURGICAL HISTORY:  Past Surgical History  Procedure Laterality Date  . Left inguinal hernia.  2004  . Craniotomy Left 03/29/2013    Procedure: CRANIOTOMY TUMOR EXCISION;  Surgeon: Karn Cassis, MD;  Location: [facility];  Service: Neurosurgery;  Laterality: Left;  Left Craniotomy for tumor excision    REVIEW OF SYSTEMS:  Pertinent items are noted in HPI.   PHYSICAL EXAMINATION: There were no vitals taken for this visit. There is no weight on file to calculate BMI. General: Patient is a well appearing male in no acute distress HEENT: PERRLA, sclerae anicteric no conjunctival pallor, MMM Neck: supple, no palpable adenopathy Lungs: clear to auscultation bilaterally, no wheezes, rhonchi, or rales Cardiovascular: regular rate rhythm, S1, S2, no murmurs, rubs or gallops Abdomen: Soft, non-tender, non-distended, normoactive bowel sounds, no HSM Extremities: warm and well perfused, no clubbing, cyanosis, or edema Skin: No rashes or lesions Neuro: Non-focal ECOG PERFORMANCE STATUS: 1 - Symptomatic but completely ambulatory    LABORATORY DATA: Lab Results  Component Value Date   WBC 8.2 06/05/2013   HGB 14.1 06/05/2013   HCT 40.9 06/05/2013   MCV 94.9 06/05/2013   PLT 219 06/05/2013      Chemistry      Component Value Date/Time   NA 137 05/22/2013 1225   NA 136 03/30/2013 0430   K 4.2 05/22/2013 1225   K 4.4 03/30/2013 0430   CL 98 05/22/2013 1225   CL 100 03/30/2013 0430   CO2 30* 05/22/2013 1225   CO2 29 03/30/2013 0430   BUN 18.6 05/22/2013 1225   BUN 21 03/30/2013 0430   CREATININE 0.9 05/22/2013 1225   CREATININE 0.81 03/30/2013 0430      Component Value Date/Time   CALCIUM 9.3 05/22/2013 1225   CALCIUM 8.5 03/30/2013 0430   ALKPHOS 54 05/22/2013 1225   ALKPHOS 50 03/29/2013 0500   AST 20 05/22/2013 1225   AST 19 03/29/2013 0500   ALT 43 05/22/2013 1225   ALT 101* 03/29/2013 0500   BILITOT 0.83 05/22/2013 1225   BILITOT 0.5 03/29/2013  0500       RADIOGRAPHIC STUDIES:  No results found.  ASSESSMENT: 57 year old gentleman with  #1 WHO grade 4 glioblastoma status post debulking. Now receiving concurrent chemotherapy and radiation therapy. Chemotherapy consisting of Temodar being given at 75 mg per meter squared with radiation. Overall he seems to be tolerating it well. Today his blood counts look terrific.   PLAN:   #1 continue Temodar at a dose of total 150 mg daily.  I recommended he take Zofran in the evening as well if he continues to be nauseated.    Patient finishes up his radiation on Wednesday he will take about a 2 week break. After that he will begin adjuvant Temodar at 175 mg per meter squared. We also did discuss  the possibility of starting him on Avastin every 2 weeks. However I would like to see how he is doing in 2 weeks' time in followup.  #3 he will be seen back in 2 weeks' time for followup.   All questions were answered. The patient knows to call the clinic with any problems, questions or concerns. We can certainly see the patient much sooner if necessary.  I spent 25 minutes counseling the patient face to face. The total time spent in the appointment was 30 minutes.  Drue Second, MD Medical/Oncology [facility] [phone] (beeper) [phone] (Office)  06/05/2013, 2:47 PM

## 2013-06-05 NOTE — Progress Notes (Signed)
Patient here weekly rad txs 28/30 GBM dca with boost, completed, voiced, no H/A, SOB,nausea, or weakness, taking dexamethasone 2mg  po in am and at night, no thrush seen on tongue, no c/o blurred vision,"  States good appetite", taking temodar 150mg  total daily  and completes this Wednesday on last day treatment, erythema on left side head mostly,incision healed, uses regular shampoo  3:52 PM

## 2013-06-05 NOTE — Progress Notes (Signed)
  Radiation Oncology         (336) [phone] ________________________________  Name: Martin Martin MRN: [id number]  Date: 06/05/2013  DOB: 24-Feb-1956  Weekly Radiation Therapy Management  Current Dose: 56 Gy     Planned Dose:  60 Gy  Narrative . . . . . . . . The patient presents for routine under treatment assessment.                                                   The patient is without complaint.                                 Set-up films were reviewed.                                 The chart was checked. Physical Findings. . .  weight is 178 lb 14.4 oz (81.149 kg). His oral temperature is 98.3 F (36.8 C). His blood pressure is 134/85 and his pulse is 74. His respiration is 20 and oxygen saturation is 98%. . Weight essentially stable.  No significant changes. Impression . . . . . . . The patient is  tolerating radiation. Plan . . . . . . . . . . . . Continue treatment as planned.  ________________________________  Artist Pais. Kathrynn Running, M.D.

## 2013-06-05 NOTE — Patient Instructions (Addendum)
Finish up with radiation therapy and temodar on 6/18  I will see you back in about 2 weeks

## 2013-06-05 NOTE — Telephone Encounter (Signed)
, °

## 2013-06-06 ENCOUNTER — Ambulatory Visit
Admission: RE | Admit: 2013-06-06 | Discharge: 2013-06-06 | Disposition: A | Discharge status: Home / Self Care | Payer: BC Managed Care – PPO | Source: Ambulatory Visit | Attending: Radiation Oncology | Admitting: Radiation Oncology

## 2013-06-07 ENCOUNTER — Encounter: Payer: Self-pay | Admitting: Radiation Oncology

## 2013-06-07 ENCOUNTER — Ambulatory Visit
Admission: RE | Admit: 2013-06-07 | Discharge: 2013-06-07 | Disposition: A | Discharge status: Home / Self Care | Payer: BC Managed Care – PPO | Source: Ambulatory Visit | Attending: Radiation Oncology | Admitting: Radiation Oncology

## 2013-06-08 ENCOUNTER — Telehealth: Payer: Self-pay | Admitting: Radiation Oncology

## 2013-06-08 NOTE — Telephone Encounter (Signed)
Per Dr. Broadus John order called in Decadron 2 mg one tablet by mouth once a day for a week, then 2 mg every other day for a week and stop. 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 Then, phoned patient making him aware this had been done and reviewing directions of taper. Patient verbalized understanding.

## 2013-06-08 NOTE — Progress Notes (Signed)
  Radiation Oncology         [phone] ________________________________  Name: Martin Martin MRN: [id number]  Date: 06/07/2013  DOB: December 25, 1955  End of Treatment Note  Diagnosis:   57 year old gentleman with glioblastoma of the left parietal brain status post biopsy/debulking  Indication for treatment:  Local Control, Improve Overall Survival       Radiation treatment dates:   04/26/2013-06/07/2013  Site/dose:    1.  The initial enhancing tumor plus surrounding edema plus a margin was treated to 44 Gy in 22 fractions of 2 Gy. 2.  The initial enhancing tumor plus a margin was boosted to 60 Gy with 8 additional fractions of 2 Gy.  Beams/energy:    1.  The initial enhancing tumor plus surrounding edema plus a margin was treated with 6 MV using dynamic conformal arcs and daily image guidance with cone beam CT with thermoplastic mask immobilization. 2.  The initial enhancing tumor plus a margin was boosted with 6 MV using dynamic conformal arcs and daily image guidance with cone beam CT with thermoplastic mask immobilization.  Narrative: The patient tolerated radiation treatment relatively well.   Attempts to taper dexamethasone were associated with worsening of altered mental status.  Otherwise, no complications occurred during treatment.  Plan: The patient has completed radiation treatment. The patient will return to radiation oncology clinic for routine followup in one month. I advised them to call or return sooner if they have any questions or concerns related to their recovery or treatment. ________________________________  Artist Pais. Kathrynn Running, M.D.

## 2013-06-13 ENCOUNTER — Telehealth: Payer: Self-pay | Admitting: Radiation Oncology

## 2013-06-13 NOTE — Telephone Encounter (Signed)
Received concerned call from patient's sister, Olegario Messier. She reports that her brother was at her house earlier today and has a white coating on his tongue, roof of his mouth and reports mild pain when swallowing. Phoned patient at [phone] to assess his status. Patient has been taking decadron for some time now and probably has thrush. Left message requesting patient return call.

## 2013-06-14 ENCOUNTER — Other Ambulatory Visit: Payer: Self-pay | Admitting: Radiation Oncology

## 2013-06-14 ENCOUNTER — Telehealth: Payer: Self-pay | Admitting: Radiation Oncology

## 2013-06-14 DIAGNOSIS — C713 Malignant neoplasm of parietal lobe: Secondary | ICD-10-CM

## 2013-06-14 MED ORDER — FLUCONAZOLE 100 MG PO TABS: 100.0000 mg | ORAL_TABLET | ORAL | Status: DC

## 2013-06-14 NOTE — Telephone Encounter (Signed)
Martin Martin,  Please notify patient.  I e-prescribed Diflucan for him at  [facility] [address] [facility] [place], [place] - [address]  Thanks.  MM

## 2013-06-14 NOTE — Telephone Encounter (Signed)
Phoned patient as directed by Dr. Kathrynn Running to inform him of Diflucan script awaiting him at [facility] in [place]. No answer. Left message requesting return call. Phoned patient's sister, Micael Hampshire, making her aware as well. She reports that she will insure her brother picks this up and begins taking it. Encouraged her to call with future needs and she verbalized understanding.

## 2013-06-16 ENCOUNTER — Telehealth: Payer: Self-pay | Admitting: Radiation Oncology

## 2013-06-16 NOTE — Telephone Encounter (Signed)
Received call from patient's sister, Micael Hampshire. She reports that her brother hasn't begun taking the diflucan yet. Advised her not to become frustrated she could only offer support but, couldn't make him accept it. She does report that her brother is going to the pharmacy today to pick up some medications and she "hopes he picks this one up too because his mouth isn't any better." She reports the patient's wife is doing better and should be discharged home soon. She reports that the family hopes once his wife is home the patient will be back on track. Encouraged to call with needs. She verbalized understanding and expressed appreciation for this writer's time.

## 2013-06-20 ENCOUNTER — Telehealth: Payer: Self-pay | Admitting: Radiation Oncology

## 2013-06-20 NOTE — Telephone Encounter (Signed)
Received call from patient's sister, Micael Hampshire. She reports that her brother is now taking decadron 2 mg every other day. She explains that he is acting weak, short of breath, irrational, and complains of a horrible occipital headache. Reports that he even hit the corner of his garage when he parked his car this afternoon. Phoned patient who downplayed symptoms. Patient states, "I have a headache but, I took tylenol and it's better." Offered for patient to see Dr. Kathrynn Running tomorrow but, he declined. Reminded patient our staff is available for any needs but, he denied needing help. Advised patient if symptom became worse to please contact our office. Patient verbalized understanding of all reviewed. Will continue to stay in contact with this patient.

## 2013-06-21 ENCOUNTER — Encounter: Payer: Self-pay | Admitting: Radiation Oncology

## 2013-06-21 ENCOUNTER — Ambulatory Visit
Admission: RE | Admit: 2013-06-21 | Discharge: 2013-06-21 | Disposition: A | Discharge status: Home / Self Care | Payer: BC Managed Care – PPO | Source: Ambulatory Visit | Attending: Radiation Oncology | Admitting: Radiation Oncology

## 2013-06-21 VITALS — BP 124/74 | HR 89 | Temp 98.2°F | Resp 18

## 2013-06-21 DIAGNOSIS — C713 Malignant neoplasm of parietal lobe: Secondary | ICD-10-CM

## 2013-06-21 MED ORDER — DEXAMETHASONE 4 MG PO TABS: 4.0000 mg | ORAL_TABLET | ORAL | Status: DC

## 2013-06-21 NOTE — Progress Notes (Signed)
  Radiation Oncology         [phone] ________________________________  Name: Martin Martin MRN: [id number]  Date: 06/21/2013  DOB: 1956-10-14  Follow-Up Visit Note  CC: [name], MD  Ernestina Penna, MD  Diagnosis:   57 year old gentleman with glioblastoma of the left parietal brain status post biopsy/debulking  Interval Since Last Radiation:  2  weeks  Narrative:  Patient presented to the clinic today without an appointment requesting to be seen by Dr. Kathrynn Running. Reports that he is taking decadron 2 mg every other day with tomorrow being his last dose. Patient states,"my wife is coming home from the hospital tomorrow and this medicine is making me crazy." Patient goes on to explain that sometimes he can't remember how to write or find words he needs to say. His sister reports that he has been very agitated and mean. Below is note from phone conversation late last night.  Received call from patient's sister, Micael Hampshire. She reports that her brother hasn't begun taking the diflucan yet. Advised her not to become frustrated she could only offer support but, couldn't make him accept it. She does report that her brother is going to the pharmacy today to pick up some medications and she "hopes he picks this one up too because his mouth isn't any better." She reports the patient's wife is doing better and should be discharged home soon. She reports that the family hopes once his wife is home the patient will be back on track. Encouraged to call with needs. She verbalized understanding and expressed appreciation for this writer's time.  Impression:  The patient is recovering from the effects of radiation.  He may have increased edema with reduced decadron.  Plan:  Increase dexamethasone and resume taper.  _____________________________________  Artist Pais Kathrynn Running, M.D.

## 2013-06-21 NOTE — Progress Notes (Signed)
Patient presented to the clinic today without an appointment requesting to be seen by Dr. Kathrynn Running. Reports that he is taking decadron 2 mg every other day with tomorrow being his last dose. Patient states,"my wife is coming home from the hospital tomorrow and this medicine is making me crazy." Patient goes on to explain that sometimes he can't remember how to write or find words he needs to say. His sister reports that he has been very agitated and mean. Below is note from phone conversation late last night.   Received call from patient's sister, Martin Martin. She reports that her brother hasn't begun taking the diflucan yet. Advised her not to become frustrated she could only offer support but, couldn't make him accept it. She does report that her brother is going to the pharmacy today to pick up some medications and she "hopes he picks this one up too because his mouth isn't any better." She reports the patient's wife is doing better and should be discharged home soon. She reports that the family hopes once his wife is home the patient will be back on track. Encouraged to call with needs. She verbalized understanding and expressed appreciation for this writer's time.

## 2013-06-28 ENCOUNTER — Encounter: Payer: Self-pay | Admitting: Oncology

## 2013-06-28 ENCOUNTER — Other Ambulatory Visit (HOSPITAL_BASED_OUTPATIENT_CLINIC_OR_DEPARTMENT_OTHER): Payer: BC Managed Care – PPO | Admitting: Lab

## 2013-06-28 ENCOUNTER — Telehealth: Payer: Self-pay | Admitting: *Deleted

## 2013-06-28 ENCOUNTER — Other Ambulatory Visit: Payer: Self-pay | Admitting: Radiation Oncology

## 2013-06-28 ENCOUNTER — Ambulatory Visit (HOSPITAL_BASED_OUTPATIENT_CLINIC_OR_DEPARTMENT_OTHER): Payer: BC Managed Care – PPO | Admitting: Oncology

## 2013-06-28 ENCOUNTER — Encounter: Payer: Self-pay | Admitting: Medical Oncology

## 2013-06-28 VITALS — BP 148/81 | HR 74 | Temp 97.8°F | Resp 20 | Ht 72.0 in | Wt 184.8 lb

## 2013-06-28 DIAGNOSIS — C713 Malignant neoplasm of parietal lobe: Secondary | ICD-10-CM

## 2013-06-28 LAB — COMPREHENSIVE METABOLIC PANEL (CC13)
ALT: 182 U/L — ABNORMAL HIGH (ref 0–55)
AST: 81 U/L — ABNORMAL HIGH (ref 5–34)
Albumin: 3.3 g/dL — ABNORMAL LOW (ref 3.5–5.0)
BUN: 28.4 mg/dL — ABNORMAL HIGH (ref 7.0–26.0)
CO2: 24 mEq/L (ref 22–29)
Calcium: 8.9 mg/dL (ref 8.4–10.4)
Chloride: 103 mEq/L (ref 98–109)
Creatinine: 0.9 mg/dL (ref 0.7–1.3)
Potassium: 3.7 mEq/L (ref 3.5–5.1)

## 2013-06-28 LAB — CBC WITH DIFFERENTIAL/PLATELET
Basophils Absolute: 0 10*3/uL (ref 0.0–0.1)
EOS%: 0 % (ref 0.0–7.0)
HCT: 38.4 % (ref 38.4–49.9)
HGB: 13.2 g/dL (ref 13.0–17.1)
MONO#: 0.6 10*3/uL (ref 0.1–0.9)
NEUT#: 12 10*3/uL — ABNORMAL HIGH (ref 1.5–6.5)
NEUT%: 89.9 % — ABNORMAL HIGH (ref 39.0–75.0)
RDW: 17 % — ABNORMAL HIGH (ref 11.0–14.6)
WBC: 13.4 10*3/uL — ABNORMAL HIGH (ref 4.0–10.3)
lymph#: 0.7 10*3/uL — ABNORMAL LOW (ref 0.9–3.3)

## 2013-06-28 LAB — TECHNOLOGIST REVIEW

## 2013-06-28 MED ORDER — TEMOZOLOMIDE 250 MG PO CAPS: 250.0000 mg | ORAL_CAPSULE | Freq: Every day | ORAL | Status: DC

## 2013-06-28 NOTE — Progress Notes (Signed)
Prescription for Temodar for preauthorization given to Axel Filler, managed care.

## 2013-06-28 NOTE — Progress Notes (Signed)
OFFICE PROGRESS NOTE  CC  [name], MD [address] [place] [address] Dr Eliane Decree Dr. Margaretmary Dys  DIAGNOSIS: 57 year old gentleman with glioblastoma of the left parietal brain status post biopsy/debulking in April 2014  PRIOR THERAPY:  #1presented on 03/24/2013 with increasing severe disorientation and confusion. Because of this he was seen in the urgent care. He was noted to have some ataxia as well as confusion and speech difficulty with visual disturbances as well. He had a CT of the head without contrast that revealed 6.4 cm area of low attenuation with surrounding vasogenic edema in the left parietal lobe. An MRI performed showed a large partially necrotic 36 x 47 x 39 mm lesion with surrounding vasogenic edema. CT of the chest and abdomen showed no primary tumor. Patient went on to have left parietal brain biopsy and resection that showed WHO grade 4 glioblastoma. Postop brain MRI showed subtotal decompression with some mild postoperative hemorrhage centrally with vasogenic edema had resolved. He has been seen by Dr. Margaretmary Dys for radiation treatment and he is now seeing me for discussion regarding concurrent and adjuvant chemotherapy for glioblastoma  #2 post surgery patient was begun on concurrent radiation and adjuvant chemotherapy consisting of Temodar at 75 mg per meter squared. He completed radiation on 06/07/2013 overall he tolerated it well. He tolerated the Temodar very nicely as well. He is maintained on Bactrim DS.  #3 patient will initiate adjuvant Temodar at 150 mg per meter squared given on a 28 day cycle on days one through 5 of every cycle. His total doses 250 mg daily x5 days. We will initiate cycle 1 as soon as he gets his prescription filled.  CURRENT THERAPY: Initiate Temodar adjuvantly at 150 mg per meter squared days one through 5(cycle 1)  INTERVAL HISTORY: Martin Martin 57 y.o. male returns for followup visit. Overall patient is  doing much better. He had been going off of Decadron unfortunately  he developed significant issues and he is now back on Decadron. He really needs a very slow taper. He otherwise denies any nausea vomiting fevers chills night sweats headaches shortness of breath chest pains palpitations. His wife was discharged from the hospital but she is now with his mother is since he cannot take care of her at home. He is quite upset over this. He is accompanied by his sister today. Remainder of the 10 point review of systems is negative.  MEDICAL HISTORY: Past Medical History  Diagnosis Date  . Hyperlipidemia   . Right bundle branch block     Normal LV function normal aortic and mitral valve bedside echocardiogram 4 2012  . Tachycardia     Resolved  . Dyslipidemia   . Coronary artery disease (CAD) excluded     Cardiolite study 2006 within normal limits  . Hypertension   . Allergy     ALLERGIES:  is allergic to dilaudid and morphine and related.  MEDICATIONS:  Current Outpatient Prescriptions  Medication Sig Dispense Refill  . calcium carbonate (TUMS - DOSED IN MG ELEMENTAL CALCIUM) 500 MG chewable tablet Chew 1 tablet by mouth daily. For indigestion      . dexamethasone (DECADRON) 4 MG tablet Take 1 tablet (4 mg total) by mouth as directed. Take 1 pill twice daily for 2 weeks then 1/2 pill twice daily until further instructions  45 tablet  5  . diltiazem (CARDIZEM CD) 120 MG 24 hr capsule Take 1 capsule (120 mg total) by mouth daily.  90  capsule  3  . levETIRAcetam (KEPPRA) 500 MG tablet Take 500 mg by mouth at bedtime. Changed by Dr. Jeral Fruit 04/11/13      . metoprolol tartrate (LOPRESSOR) 25 MG tablet Take 25 mg by mouth 2 (two) times daily.       . pantoprazole (PROTONIX) 40 MG tablet take 1 tablet by mouth once daily  30 tablet  0  . sulfamethoxazole-trimethoprim (BACTRIM DS,SEPTRA DS) 800-160 MG per tablet Take 1 pill daily  30 tablet  5  . ALPRAZolam (XANAX) 0.25 MG tablet Take 1 tablet (0.25  mg total) by mouth at bedtime as needed for sleep.  30 tablet  0  . fluconazole (DIFLUCAN) 100 MG tablet Take 1 tablet (100 mg total) by mouth as directed. Take 2 today, then 1 daily for total of 7 days, then stop  8 tablet  0  . ondansetron (ZOFRAN) 8 MG tablet Take 1/2 hour prior to temodar daily  30 tablet  3  . temozolomide (TEMODAR) 140 MG capsule Take 1 capsule (140 mg total) by mouth daily. May take on an empty stomach or at bedtime to decrease nausea & vomiting.  30 capsule  2  . temozolomide (TEMODAR) 5 MG capsule Take 2 capsules daily  60 capsule  3   No current facility-administered medications for this visit.    SURGICAL HISTORY:  Past Surgical History  Procedure Laterality Date  . Left inguinal hernia.  2004  . Craniotomy Left 03/29/2013    Procedure: CRANIOTOMY TUMOR EXCISION;  Surgeon: Karn Cassis, MD;  Location: [facility];  Service: Neurosurgery;  Laterality: Left;  Left Craniotomy for tumor excision    REVIEW OF SYSTEMS:  Pertinent items are noted in HPI.   PHYSICAL EXAMINATION: Blood pressure 148/81, pulse 74, temperature 97.8 F (36.6 C), temperature source Oral, resp. rate 20, height 6' (1.829 m), weight 184 lb 12.8 oz (83.825 kg). Body mass index is 25.06 kg/(m^2). General: Patient is a well appearing male in no acute distress HEENT: PERRLA, sclerae anicteric no conjunctival pallor, MMM Neck: supple, no palpable adenopathy Lungs: clear to auscultation bilaterally, no wheezes, rhonchi, or rales Cardiovascular: regular rate rhythm, S1, S2, no murmurs, rubs or gallops Abdomen: Soft, non-tender, non-distended, normoactive bowel sounds, no HSM Extremities: warm and well perfused, no clubbing, cyanosis, or edema Skin: No rashes or lesions Neuro: Non-focal ECOG PERFORMANCE STATUS: 1 - Symptomatic but completely ambulatory    LABORATORY DATA: Lab Results  Component Value Date   WBC 13.4* 06/28/2013   HGB 13.2 06/28/2013   HCT 38.4 06/28/2013   MCV 94.8 06/28/2013    PLT 219 06/28/2013      Chemistry      Component Value Date/Time   NA 139 06/28/2013 1030   NA 136 03/30/2013 0430   K 3.7 06/28/2013 1030   K 4.4 03/30/2013 0430   CL 102 06/05/2013 1406   CL 100 03/30/2013 0430   CO2 24 06/28/2013 1030   CO2 29 03/30/2013 0430   BUN 28.4* 06/28/2013 1030   BUN 21 03/30/2013 0430   CREATININE 0.9 06/28/2013 1030   CREATININE 0.81 03/30/2013 0430      Component Value Date/Time   CALCIUM 8.9 06/28/2013 1030   CALCIUM 8.5 03/30/2013 0430   ALKPHOS 55 06/28/2013 1030   ALKPHOS 50 03/29/2013 0500   AST 81* 06/28/2013 1030   AST 19 03/29/2013 0500   ALT 182* 06/28/2013 1030   ALT 101* 03/29/2013 0500   BILITOT 0.38 06/28/2013 1030   BILITOT  0.5 03/29/2013 0500       RADIOGRAPHIC STUDIES:  No results found.  ASSESSMENT: 57 year old gentleman with  #1 WHO grade 4 glioblastoma status post debulking. He went on to receive concurrent radiation therapy and Temodar 75 mg per meter squared. He completed radiation therapy from 04/27/2013 through 06/07/2013.  #2 patient to begin adjuvant Temodar at 175 mg per meter squared. His total dose is 250 mg days 1 through 5 on a 28 day cycle. We explained the risks benefits side effects of this as well as rationale. I am not giving him Avastin at this point. I would like to see repeat MRI performed in the next few months prior to initiating Avastin.  PLAN:  #1 patient will proceed with cycle 1 days one through 5 of Temodar 250 mg daily x5 days.  #2 he will return in 3-4 weeks' time for followup.  #3 he knows to call me with any problems   All questions were answered. The patient knows to call the clinic with any problems, questions or concerns. We can certainly see the patient much sooner if necessary.  I spent 25 minutes counseling the patient face to face. The total time spent in the appointment was 30 minutes.  Drue Second, MD Medical/Oncology [facility] [phone] (beeper) [phone] (Office)  06/28/2013,  11:55 AM

## 2013-06-28 NOTE — Telephone Encounter (Signed)
appts made and printed...td 

## 2013-06-28 NOTE — Progress Notes (Signed)
Faxed temodar prescription to [facility]. °

## 2013-06-30 ENCOUNTER — Telehealth: Payer: Self-pay | Admitting: Medical Oncology

## 2013-06-30 ENCOUNTER — Encounter: Payer: Self-pay | Admitting: *Deleted

## 2013-06-30 NOTE — Progress Notes (Signed)
RECEIVED A FAX FROM [facility] CONCERNING A CONFIRMATION OF PRESCRIPTION SHIPMENT FOR TEMOZOLOMIDE ON 06/29/13.

## 2013-06-30 NOTE — Telephone Encounter (Signed)
Patient's sister called for patient asking if ok for pt to take temodar Monday-Friday, patient states would be easier for him to remember that. Per MD, informed patient/sister ok to for patient to take temodar Monday-Friday. Verbal understanding expressed. Patient knows to call office with questions or concerns.

## 2013-07-05 ENCOUNTER — Telehealth: Payer: Self-pay | Admitting: Oncology

## 2013-07-05 NOTE — Telephone Encounter (Signed)
Received a call from Merilyn Baba, Martin Martin's sister called to verify his appointment time with Dr. Kathrynn Running tomorrow.  Told Tammi that the appointment time is for 4:45 pm.

## 2013-07-06 ENCOUNTER — Encounter: Payer: Self-pay | Admitting: Radiation Oncology

## 2013-07-06 ENCOUNTER — Ambulatory Visit
Admission: RE | Admit: 2013-07-06 | Discharge: 2013-07-06 | Disposition: A | Discharge status: Home / Self Care | Payer: BC Managed Care – PPO | Source: Ambulatory Visit | Attending: Radiation Oncology | Admitting: Radiation Oncology

## 2013-07-06 VITALS — BP 152/93 | HR 109 | Temp 98.3°F | Resp 20 | Wt 186.1 lb

## 2013-07-06 DIAGNOSIS — C713 Malignant neoplasm of parietal lobe: Secondary | ICD-10-CM

## 2013-07-06 NOTE — Progress Notes (Signed)
Radiation Oncology         (336) [phone] ________________________________  Name: Martin Martin MRN: [id number]  Date: 07/06/2013  DOB: [birth year]  Follow-Up Visit Note  CC: [name], MD  Ernestina Penna, MD  Diagnosis:   57 year old gentleman with glioblastoma of the left parietal brain status post biopsy/debulking treated to 60 Gy with Temodar  Interval Since Last Radiation:  4  weeks  Narrative:  The patient returns today for routine follow-up.  He is not taking diflucan, because he's not convinced he has thrush and thinks we just want to give him more pills. He is taking decadron 2mg  tab in am and in pm, temodar 250mg  daily for 1 week, then off 3 weeks, then restart. He has a good appetite, drinking water,milk,juice, he has no blurred vision, no head ache. The patient gets fatigued. The patient wants to come off the decadron, and says it makes him mad and irritable                              ALLERGIES:  is allergic to dilaudid and morphine and related.  Meds: Current Outpatient Prescriptions  Medication Sig Dispense Refill  . diltiazem (CARDIZEM CD) 120 MG 24 hr capsule Take 1 capsule (120 mg total) by mouth daily.  90 capsule  3  . levETIRAcetam (KEPPRA) 500 MG tablet Take 500 mg by mouth at bedtime. Changed by Dr. Jeral Fruit 04/11/13      . ondansetron (ZOFRAN) 8 MG tablet Take 1/2 hour prior to temodar daily  30 tablet  3  . pantoprazole (PROTONIX) 40 MG tablet take 1 tablet by mouth once daily  30 tablet  0  . sulfamethoxazole-trimethoprim (BACTRIM DS,SEPTRA DS) 800-160 MG per tablet Take 1 pill daily  30 tablet  5  . temozolomide (TEMODAR) 250 MG capsule Take 1 capsule (250 mg total) by mouth daily. May take on an empty stomach or at bedtime to decrease nausea & vomiting.  10 capsule  6  . ALPRAZolam (XANAX) 0.25 MG tablet Take 1 tablet (0.25 mg total) by mouth at bedtime as needed for sleep.  30 tablet  0  . calcium carbonate (TUMS - DOSED IN MG ELEMENTAL CALCIUM) 500 MG  chewable tablet Chew 1 tablet by mouth daily. For indigestion      . dexamethasone (DECADRON) 4 MG tablet Take 1 tablet (4 mg total) by mouth as directed. Take 1 pill twice daily for 2 weeks then 1/2 pill twice daily until further instructions  45 tablet  5  . fluconazole (DIFLUCAN) 100 MG tablet Take 1 tablet (100 mg total) by mouth as directed. Take 2 today, then 1 daily for total of 7 days, then stop  8 tablet  0  . metoprolol tartrate (LOPRESSOR) 25 MG tablet Take 25 mg by mouth 2 (two) times daily.        No current facility-administered medications for this encounter.    Physical Findings: The patient is in no acute distress. Patient is alert and oriented.  weight is 186 lb 1.6 oz (84.414 kg). His oral temperature is 98.3 F (36.8 C). His blood pressure is 152/93 and his pulse is 109. His respiration is 20 and oxygen saturation is 96%. .  tongue still coated white with fulminant thrush,  No significant changes.  Lab Findings: Lab Results  Component Value Date   WBC 13.4* 06/28/2013   HGB 13.2 06/28/2013   HCT 38.4 06/28/2013  MCV 94.8 06/28/2013   PLT 219 06/28/2013    Impression:  The patient is recovering from the effects of radiation.  He has oral candidiasis.  Plan:  We will obtain brain MRI in the next few weeks prior brain conference at present his case for multidisciplinary discussion and seen in the office after conference to review the results.  _____________________________________  Artist Pais Kathrynn Running, M.D.

## 2013-07-06 NOTE — Progress Notes (Signed)
Follow up s/p glioblastoma, swelliong facial, not taking diflucan, decadron 2mg  tab in am and in pm, temodar 250mg  daily for 1 week, then off 3 weeks, then restart,  tongue still coated white, good appetite, drinking water,milk,juice, no blurred vision, no head ache, patient gets fatigued , patient wants to come off the decadron, says it makes him mad  And irritable 4:38 PM

## 2013-07-10 ENCOUNTER — Other Ambulatory Visit: Payer: Self-pay | Admitting: Radiation Therapy

## 2013-07-10 DIAGNOSIS — C713 Malignant neoplasm of parietal lobe: Secondary | ICD-10-CM

## 2013-07-17 ENCOUNTER — Telehealth: Payer: Self-pay | Admitting: Radiation Oncology

## 2013-07-17 NOTE — Telephone Encounter (Signed)
Received message from patient's sister, Talitha Givens. On the answering machine Lynden Ang reports that her brother is tapering down off decadron and experiencing leg weakness and increased swelling. Returned [employer] but, our conversation was very limited. Patient was present in the back ground during this conversation.  Explained that on Thursday, July 31st Dr. Kathrynn Running would not be present in the clinic but, reassured her that if the MRI to be done that same day showed anything concerning the on call physician will be glad to assist until Dr. Kathrynn Running returns. Offered for patient to be seen by another rad onc physician today but, the patient declined. MRI and follow up remain as scheduled. Encouraged to call with future needs.

## 2013-07-18 ENCOUNTER — Telehealth: Payer: Self-pay | Admitting: Radiation Oncology

## 2013-07-18 NOTE — Telephone Encounter (Addendum)
Late entry from 07/17/2013. Received call from patient's concerned sister, Olegario Messier. She reports that her brother is taperind down off decadron; taking 2 mg in the morning and 2 mg in the afternoon. She reports that he is weak and sluggish. Reminded her of brain scan on Thursday that was moved up for similar symptoms. Reassured her if something appeared emergent on the MRI one of our staff would contact them immediately. Advised her that if his condition deteriorated  to take him to the emergency room. She continues to explain that he is under a great deal of stress since his diagnosis and wife's recent heart surgery. She reports that her brother's wife is staying with her mother because she is unable to handle the patient's "intense mood swings." Offered support and listened to concerns. Encouraged to call with future needs and she verbalized understanding.

## 2013-07-20 ENCOUNTER — Encounter: Payer: Self-pay | Admitting: Radiation Oncology

## 2013-07-20 ENCOUNTER — Telehealth: Payer: Self-pay | Admitting: Radiation Oncology

## 2013-07-20 ENCOUNTER — Ambulatory Visit
Admission: RE | Admit: 2013-07-20 | Discharge: 2013-07-20 | Disposition: A | Discharge status: Home / Self Care | Payer: BC Managed Care – PPO | Source: Ambulatory Visit | Attending: Radiation Oncology | Admitting: Radiation Oncology

## 2013-07-20 DIAGNOSIS — C713 Malignant neoplasm of parietal lobe: Secondary | ICD-10-CM

## 2013-07-20 MED ORDER — GADOBENATE DIMEGLUMINE 529 MG/ML IV SOLN: 17.0000 mL | Freq: Once | INTRAVENOUS | Status: AC | PRN

## 2013-07-20 NOTE — Progress Notes (Signed)
1600 Patient presented to the clinic today accompanied by his sister following MRI scan. Patient reports lower leg weakness. Steady gait noted without assistance of device. Patient denies nausea, vomiting, headache or dizziness. Moon face noted. Patient request results of MRI. Explained to the patient the impression was not ready yet and that after conferring with Dr. Kathrynn Running this writer would contact him. Patient verbalized understanding.

## 2013-07-20 NOTE — Progress Notes (Signed)
1600 Patient reports that he is taking decadron 2 mg one tablet in the AM and one tablet in the PM.

## 2013-07-20 NOTE — Telephone Encounter (Signed)
Phoned patient at home to inform him of MRI results. No answer. Phoned sister, Lynden Ang, who was with the patient. Explained to them that per Dr. Kathrynn Running the scan looks good and there is less edema. They both expressed great appreciation for the call. Cathy tearful said thank you thank you tell Dr. Kathrynn Running thank you tell him we love him. Reminded patient of appointment on 8/5 at 4 pm to discuss decadron taper.

## 2013-07-24 ENCOUNTER — Telehealth: Payer: Self-pay | Admitting: *Deleted

## 2013-07-24 NOTE — Telephone Encounter (Signed)
[name],sister called asking to speak with Dr.Manning's nurse [name],informed her ,Sam's with a patient and would need to call you back, but [name] asked if Martin Martin still needed to come in for f/u appt tomorrow, since he knew his MRI results, Yes, he still needs to come in for the follow up appt, per RN 3:19 PM

## 2013-07-25 ENCOUNTER — Ambulatory Visit
Admission: RE | Admit: 2013-07-25 | Discharge: 2013-07-25 | Disposition: A | Discharge status: Home / Self Care | Payer: BC Managed Care – PPO | Source: Ambulatory Visit | Attending: Radiation Oncology | Admitting: Radiation Oncology

## 2013-07-25 ENCOUNTER — Encounter: Payer: Self-pay | Admitting: Radiation Oncology

## 2013-07-25 ENCOUNTER — Other Ambulatory Visit: Payer: Self-pay | Admitting: Radiation Oncology

## 2013-07-25 VITALS — BP 136/89 | HR 94 | Resp 16 | Wt 194.5 lb

## 2013-07-25 DIAGNOSIS — C713 Malignant neoplasm of parietal lobe: Secondary | ICD-10-CM

## 2013-07-25 NOTE — Progress Notes (Signed)
Radiation Oncology         (336) [phone] ________________________________  Name: Martin Martin MRN: [id number]  Date: 07/25/2013  DOB: 23-Jun-1956  Multidisciplinary Neuro Oncology Clinic Follow-Up Visit Note  CC: [name], MD  Chipper Herb, MD  Diagnosis:   57 year old gentleman with glioblastoma of the left parietal brain status post biopsy/debulking treated to 60 Gy with Temodar  Interval Since Last Radiation:  6 weeks  Narrative:  The patient returns today for routine follow-up.  The recent films were reviewed just prior to the clinic.  He is essentially stable. He does note some increasing weakness and proximal muscle groups .                             ALLERGIES:  is allergic to dilaudid and morphine and related.  Meds: Current Outpatient Prescriptions  Medication Sig Dispense Refill  . ALPRAZolam (XANAX) 0.25 MG tablet Take 1 tablet (0.25 mg total) by mouth at bedtime as needed for sleep.  30 tablet  0  . calcium carbonate (TUMS - DOSED IN MG ELEMENTAL CALCIUM) 500 MG chewable tablet Chew 1 tablet by mouth daily. For indigestion      . dexamethasone (DECADRON) 4 MG tablet Take 1 tablet (4 mg total) by mouth as directed. Take 1 pill twice daily for 2 weeks then 1/2 pill twice daily until further instructions  45 tablet  5  . diltiazem (CARDIZEM CD) 120 MG 24 hr capsule Take 1 capsule (120 mg total) by mouth daily.  90 capsule  3  . levETIRAcetam (KEPPRA) 500 MG tablet Take 500 mg by mouth at bedtime. Changed by Dr. Joya Salm 04/11/13      . metoprolol tartrate (LOPRESSOR) 25 MG tablet Take 25 mg by mouth 2 (two) times daily.       . pantoprazole (PROTONIX) 40 MG tablet take 1 tablet by mouth once daily  30 tablet  0  . sulfamethoxazole-trimethoprim (BACTRIM DS,SEPTRA DS) 800-160 MG per tablet Take 1 pill daily  30 tablet  5  . fluconazole (DIFLUCAN) 100 MG tablet Take 1 tablet (100 mg total) by mouth as directed. Take 2 today, then 1 daily for total of 7 days, then stop  8  tablet  0  . ondansetron (ZOFRAN) 8 MG tablet Take 1/2 hour prior to temodar daily  30 tablet  3  . temozolomide (TEMODAR) 250 MG capsule Take 1 capsule (250 mg total) by mouth daily. May take on an empty stomach or at bedtime to decrease nausea & vomiting.  10 capsule  6   No current facility-administered medications for this encounter.    Physical Findings: The patient is in no acute distress. Patient is alert and oriented.  weight is 194 lb 8 oz (88.225 kg). His blood pressure is 136/89 and his pulse is 94. His respiration is 16 and oxygen saturation is 97%. .  No significant changes.  Lab Findings: Lab Results  Component Value Date   WBC 13.4* 06/28/2013   HGB 13.2 06/28/2013   HCT 38.4 06/28/2013   MCV 94.8 06/28/2013   PLT 219 06/28/2013    Radiographic Findings: Mr Jeri Cos YQ Contrast  07/20/2013   *RADIOLOGY REPORT*  Clinical Data: Status status post resection of glioblastoma multiforme.  MRI HEAD WITHOUT AND WITH CONTRAST  Technique:  Multiplanar, multiecho pulse sequences of the brain and surrounding structures were obtained according to standard protocol without and with intravenous contrast  Contrast:  17 ml Multihance.  Comparison: MRI brain 04/01/2013 and 03/24/2013.  Findings: Blood products are again noted within the resection cavity of the left parietal lobe following craniotomy.  The collection is similar to slightly larger in size, measuring 4.1 x 3.5 x 3.8 cm.  This slightly more homogeneous than on the prior study.  A well-defined rim of enhancement is now present.  No distal enhancement is present.  No acute infarct or new hemorrhage is present.  The surrounding edema is less intense with partial re- expansion of the posterior horn of the left lateral ventricle.  Flow is present in the major intracranial arteries.  The patient is status post bilateral lens extractions.  The paranasal sinuses and mastoid air cells are clear.  IMPRESSION:  1.  Slight increase in size of the surgical  cavity with more well- defined peripheral enhancement.  If the patient has been treated with radiotherapy, this could represent to reading crisis.  The tumor recurrence is considered less likely. 2.  Residual blood products within the surgical cavity or more homogeneous than on the prior study. 3.  No evidence for distant metastases. 4.  Slight decrease in surrounding edema and mass effect.   Original Report Authenticated By: San Morelle, M.D.    Impression:  The patient is recovering from the effects of radiation.  MRI shows improvement in edema and increasing necrosis of the treated tumor site.  Plan:  Today, I advised the patient to cut back from 2 mg dexamethasone twice a day to 2 mg dexamethasone once daily for 2 weeks and then stop. Given his difficulty with Decadron taper 7 the past, as they return for routine clinical followup in one month. Following that, I would like for the patient undergo followup brain MRI in 3 months prior to the brain tumor conference and be seen in the multidisciplinary brain tumor clinic.  _____________________________________  Sheral Apley. Tammi Klippel, M.D.

## 2013-07-25 NOTE — Addendum Note (Signed)
Encounter addended by: Oneita Hurt, MD on: 07/25/2013  9:10 PM<BR>     Documentation filed: Clinical Notes

## 2013-07-25 NOTE — Progress Notes (Signed)
Moon face noted. Weight gain noted. Eight pound weight gain noted since 07/06/13. Patient taking decadron 2 mg in the morning and in the evening. Denies headache, dizziness, nausea, vomiting, diplopia or ringing in the ears. Reports he slept approximately five hours last night.

## 2013-07-26 ENCOUNTER — Other Ambulatory Visit: Payer: BC Managed Care – PPO

## 2013-07-27 ENCOUNTER — Ambulatory Visit: Payer: BC Managed Care – PPO | Admitting: Radiation Oncology

## 2013-07-28 ENCOUNTER — Ambulatory Visit (HOSPITAL_BASED_OUTPATIENT_CLINIC_OR_DEPARTMENT_OTHER): Payer: BC Managed Care – PPO | Admitting: Oncology

## 2013-07-28 ENCOUNTER — Encounter: Payer: Self-pay | Admitting: Oncology

## 2013-07-28 ENCOUNTER — Telehealth: Payer: Self-pay | Admitting: Oncology

## 2013-07-28 ENCOUNTER — Other Ambulatory Visit (HOSPITAL_BASED_OUTPATIENT_CLINIC_OR_DEPARTMENT_OTHER): Payer: BC Managed Care – PPO | Admitting: Lab

## 2013-07-28 VITALS — BP 129/76 | HR 102 | Temp 98.5°F | Resp 18 | Ht 73.0 in | Wt 196.6 lb

## 2013-07-28 DIAGNOSIS — C719 Malignant neoplasm of brain, unspecified: Secondary | ICD-10-CM

## 2013-07-28 DIAGNOSIS — I2510 Atherosclerotic heart disease of native coronary artery without angina pectoris: Secondary | ICD-10-CM

## 2013-07-28 DIAGNOSIS — C713 Malignant neoplasm of parietal lobe: Secondary | ICD-10-CM

## 2013-07-28 DIAGNOSIS — C711 Malignant neoplasm of frontal lobe: Secondary | ICD-10-CM

## 2013-07-28 LAB — COMPREHENSIVE METABOLIC PANEL (CC13)
ALT: 227 U/L — ABNORMAL HIGH (ref 0–55)
AST: 29 U/L (ref 5–34)
Albumin: 3.3 g/dL — ABNORMAL LOW (ref 3.5–5.0)
Alkaline Phosphatase: 45 U/L (ref 40–150)
Potassium: 3.5 mEq/L (ref 3.5–5.1)
Sodium: 143 mEq/L (ref 136–145)
Total Bilirubin: 0.52 mg/dL (ref 0.20–1.20)
Total Protein: 6.3 g/dL — ABNORMAL LOW (ref 6.4–8.3)

## 2013-07-28 LAB — CBC WITH DIFFERENTIAL/PLATELET
BASO%: 0.3 % (ref 0.0–2.0)
EOS%: 0.3 % (ref 0.0–7.0)
HGB: 13.4 g/dL (ref 13.0–17.1)
MCH: 32.4 pg (ref 27.2–33.4)
MCV: 99 fL — ABNORMAL HIGH (ref 79.3–98.0)
MONO%: 8.6 % (ref 0.0–14.0)
RBC: 4.13 10*6/uL — ABNORMAL LOW (ref 4.20–5.82)
RDW: 16.7 % — ABNORMAL HIGH (ref 11.0–14.6)
lymph#: 1 10*3/uL (ref 0.9–3.3)
nRBC: 1 % — ABNORMAL HIGH (ref 0–0)

## 2013-07-28 MED ORDER — TEMOZOLOMIDE 250 MG PO CAPS: 250.0000 mg | ORAL_CAPSULE | Freq: Every day | ORAL | Status: DC

## 2013-07-28 MED ORDER — PANTOPRAZOLE SODIUM 40 MG PO TBEC: DELAYED_RELEASE_TABLET | ORAL | Status: DC

## 2013-07-28 NOTE — Telephone Encounter (Signed)
, °

## 2013-07-28 NOTE — Progress Notes (Signed)
OFFICE PROGRESS NOTE  CC  [name], MD [address] [place] [address] Dr Eliane Decree Dr. Margaretmary Dys  DIAGNOSIS: 57 year old gentleman with glioblastoma of the left parietal brain status post biopsy/debulking in April 2014  PRIOR THERAPY:  #1presented on 03/24/2013 with increasing severe disorientation and confusion. Because of this he was seen in the urgent care. He was noted to have some ataxia as well as confusion and speech difficulty with visual disturbances as well. He had a CT of the head without contrast that revealed 6.4 cm area of low attenuation with surrounding vasogenic edema in the left parietal lobe. An MRI performed showed a large partially necrotic 36 x 47 x 39 mm lesion with surrounding vasogenic edema. CT of the chest and abdomen showed no primary tumor. Patient went on to have left parietal brain biopsy and resection that showed WHO grade 4 glioblastoma. Postop brain MRI showed subtotal decompression with some mild postoperative hemorrhage centrally with vasogenic edema had resolved. He has been seen by Dr. Margaretmary Dys for radiation treatment and he is now seeing me for discussion regarding concurrent and adjuvant chemotherapy for glioblastoma  #2 post surgery patient was begun on concurrent radiation and adjuvant chemotherapy consisting of Temodar at 75 mg per meter squared. He completed radiation on 06/07/2013 overall he tolerated it well. He tolerated the Temodar very nicely as well. He is maintained on Bactrim DS.  #3 patient will initiate adjuvant Temodar at 150 mg per meter squared given on a 28 day cycle on days one through 5 of every cycle. His total doses 250 mg daily x5 days. We will initiate cycle 1 as soon as he gets his prescription filled.  CURRENT THERAPY:  Temodar adjuvantly at 250 mg per meter squared days one through 5(cycle 2)  INTERVAL HISTORY: Martin Martin 57 y.o. male returns for followup visit. Overall patient is doing much  better. He really needs a very slow taper. He otherwise denies any nausea vomiting fevers chills night sweats headaches shortness of breath chest pains palpitations. His wife was discharged from the hospital but she is now with his mother is since he cannot take care of her at home. He is quite upset over this. He is accompanied by his sister today. Remainder of the 10 point review of systems is negative.  MEDICAL HISTORY: Past Medical History  Diagnosis Date  . Hyperlipidemia   . Right bundle branch block     Normal LV function normal aortic and mitral valve bedside echocardiogram 4 2012  . Tachycardia     Resolved  . Dyslipidemia   . Coronary artery disease (CAD) excluded     Cardiolite study 2006 within normal limits  . Hypertension   . Allergy     ALLERGIES:  is allergic to dilaudid and morphine and related.  MEDICATIONS:  Current Outpatient Prescriptions  Medication Sig Dispense Refill  . calcium carbonate (TUMS - DOSED IN MG ELEMENTAL CALCIUM) 500 MG chewable tablet Chew 1 tablet by mouth daily. For indigestion      . diltiazem (CARDIZEM CD) 120 MG 24 hr capsule Take 1 capsule (120 mg total) by mouth daily.  90 capsule  3  . levETIRAcetam (KEPPRA) 500 MG tablet Take 500 mg by mouth at bedtime. Changed by Dr. Jeral Fruit 04/11/13      . metoprolol tartrate (LOPRESSOR) 25 MG tablet Take 25 mg by mouth 2 (two) times daily.       . pantoprazole (PROTONIX) 40 MG tablet take 1 tablet  by mouth once daily  30 tablet  0  . sulfamethoxazole-trimethoprim (BACTRIM DS,SEPTRA DS) 800-160 MG per tablet Take 1 pill daily  30 tablet  5  . temozolomide (TEMODAR) 250 MG capsule Take 1 capsule (250 mg total) by mouth daily. May take on an empty stomach or at bedtime to decrease nausea & vomiting.  10 capsule  6  . ALPRAZolam (XANAX) 0.25 MG tablet Take 1 tablet (0.25 mg total) by mouth at bedtime as needed for sleep.  30 tablet  0  . dexamethasone (DECADRON) 4 MG tablet Take 1 tablet (4 mg total) by mouth  as directed. Take 1 pill twice daily for 2 weeks then 1/2 pill twice daily until further instructions  45 tablet  5  . ondansetron (ZOFRAN) 8 MG tablet Take 1/2 hour prior to temodar daily  30 tablet  3   No current facility-administered medications for this visit.    SURGICAL HISTORY:  Past Surgical History  Procedure Laterality Date  . Left inguinal hernia.  2004  . Craniotomy Left 03/29/2013    Procedure: CRANIOTOMY TUMOR EXCISION;  Surgeon: Karn Cassis, MD;  Location: [facility];  Service: Neurosurgery;  Laterality: Left;  Left Craniotomy for tumor excision    REVIEW OF SYSTEMS:  Pertinent items are noted in HPI.   PHYSICAL EXAMINATION: Blood pressure 129/76, pulse 102, temperature 98.5 F (36.9 C), temperature source Oral, resp. rate 18, height 6\' 1"  (1.854 m), weight 196 lb 9.6 oz (89.177 kg). Body mass index is 25.94 kg/(m^2). General: Patient is a well appearing male in no acute distress HEENT: PERRLA, sclerae anicteric no conjunctival pallor, MMM Neck: supple, no palpable adenopathy Lungs: clear to auscultation bilaterally, no wheezes, rhonchi, or rales Cardiovascular: regular rate rhythm, S1, S2, no murmurs, rubs or gallops Abdomen: Soft, non-tender, non-distended, normoactive bowel sounds, no HSM Extremities: warm and well perfused, no clubbing, cyanosis, or edema Skin: No rashes or lesions Neuro: Non-focal ECOG PERFORMANCE STATUS: 1 - Symptomatic but completely ambulatory    LABORATORY DATA: Lab Results  Component Value Date   WBC 6.3 07/28/2013   HGB 13.4 07/28/2013   HCT 40.9 07/28/2013   MCV 99.0* 07/28/2013   PLT 172 07/28/2013      Chemistry      Component Value Date/Time   NA 143 07/28/2013 1029   NA 136 03/30/2013 0430   K 3.5 07/28/2013 1029   K 4.4 03/30/2013 0430   CL 102 06/05/2013 1406   CL 100 03/30/2013 0430   CO2 25 07/28/2013 1029   CO2 29 03/30/2013 0430   BUN 24.8 07/28/2013 1029   BUN 21 03/30/2013 0430   CREATININE 0.9 07/28/2013 1029   CREATININE  0.81 03/30/2013 0430      Component Value Date/Time   CALCIUM 8.7 07/28/2013 1029   CALCIUM 8.5 03/30/2013 0430   ALKPHOS 45 07/28/2013 1029   ALKPHOS 50 03/29/2013 0500   AST 29 07/28/2013 1029   AST 19 03/29/2013 0500   ALT 227* 07/28/2013 1029   ALT 101* 03/29/2013 0500   BILITOT 0.52 07/28/2013 1029   BILITOT 0.5 03/29/2013 0500       RADIOGRAPHIC STUDIES:  No results found.  ASSESSMENT: 56 year old gentleman with  #1 WHO grade 4 glioblastoma status post debulking. He went on to receive concurrent radiation therapy and Temodar 75 mg per meter squared. He completed radiation therapy from 04/27/2013 through 06/07/2013.  #2 patient to begin adjuvant Temodar at 175 mg per meter squared. His total dose is 250  mg days 1 through 5 on a 28 day cycle. We explained the risks benefits side effects of this as well as rationale. I am not giving him Avastin at this point. I would like to see repeat MRI performed in the next few months prior to initiating Avastin.  PLAN:  #1 patient will proceed with cycle 2 days one through 5 of Temodar 250 mg daily x5 days.(07/31/13 - 08/04/13)  #2 Return on 08/17/13 for follow up with labs  #3 he knows to call me with any problems   All questions were answered. The patient knows to call the clinic with any problems, questions or concerns. We can certainly see the patient much sooner if necessary.  I spent 25 minutes counseling the patient face to face. The total time spent in the appointment was 30 minutes.  Drue Second, MD Medical/Oncology [facility] [phone] (beeper) [phone] (Office)  07/28/2013, 11:24 AM

## 2013-07-28 NOTE — Progress Notes (Signed)
Faxed temodar prescription to [facility]. °

## 2013-07-31 ENCOUNTER — Telehealth: Payer: Self-pay | Admitting: *Deleted

## 2013-07-31 NOTE — Telephone Encounter (Signed)
Called pt, per MD okay to start medication when it arrives and to continue for 5 days total. Pt verbalized understanding. No further concerns.

## 2013-07-31 NOTE — Telephone Encounter (Signed)
Yes start when medication gets here and continue for 5 days total

## 2013-07-31 NOTE — Telephone Encounter (Signed)
Pt's relative called [facility] states " Martin Martin's Temodar has not come and will not be here until tomorrow 8/12. He was to start his medicine today but it's not here. Should he start this tomorrow and continue for 5 days. I just want to make sure this is okay." Will review with provider. Pt currently taking Temodar 250mg  daily at bedtime days 1 -5. Next f/u appt lab/MD 08/17/13

## 2013-08-01 ENCOUNTER — Encounter: Payer: Self-pay | Admitting: *Deleted

## 2013-08-01 NOTE — Progress Notes (Signed)
RECEIVED A FAX FROM [facility] CONCERNING A CONFIRMATION OF PRESCRIPTION SHIPMENT FOR TEMOZOLOMIDE ON 07/31/13.

## 2013-08-03 ENCOUNTER — Telehealth: Payer: Self-pay | Admitting: *Deleted

## 2013-08-03 ENCOUNTER — Other Ambulatory Visit: Payer: Self-pay | Admitting: Radiation Therapy

## 2013-08-03 DIAGNOSIS — C713 Malignant neoplasm of parietal lobe: Secondary | ICD-10-CM

## 2013-08-03 NOTE — Telephone Encounter (Signed)
PT. STARTED HIS TEMODAR ON Wednesday, 08/02/13. HE WILL TAKE THIS MEDICATION THROUGH Sunday, 08/06/13. NOTIFIED DR.KHAN'S NURSE, AMY MITCHELL,RN.

## 2013-08-07 ENCOUNTER — Telehealth: Payer: Self-pay | Admitting: Radiation Oncology

## 2013-08-07 ENCOUNTER — Telehealth: Payer: Self-pay | Admitting: *Deleted

## 2013-08-07 NOTE — Telephone Encounter (Signed)
Per Dr. Broadus John order phoned in Diflucan 200 mg today, then 100 mg daily for seven days. No refills. Spoke with [employer] [facility] [employer] in [place], [place].

## 2013-08-07 NOTE — Telephone Encounter (Signed)
Patient's sister, Talitha Givens, phoned. She explains that her brother completed his decadron taper Wednesday/Thursday of last week she "isn't sure which day exactly". She reports he complained Saturday of a headache and was very aggressive "calling her ugly ugly names". On Sunday he completed his five day round of Temodar. She explains he called her Sunday night upset that he "couldn't remember how to use his can opener and all he wanted was a can of tomato soup." Also, he complained to her he "has that yeast in his mouth again." She reports that she has noticed since he completed his decadron taper his appetite has decreased. She explains "one of the doctor told him to take decadron 4 mg tablet Sunday night and another this morning." However, she reports he only took Decadron 2 mg last night. She reports that she checked in on him this morning and he is feeling better. She questions how "they should proceed from this point and wonders if his issues over the weekend are from the decadron taper or the temodar." Will contact her with further direction from Dr. Kathrynn Running.

## 2013-08-07 NOTE — Telephone Encounter (Signed)
Please advise that they continue Dex 2 mg BID for one week then once daily until next follow-up visit.  Also, please phone in Diflucan 200 mg today, then 100 mg daily for 7 days total, 8 tablets (100 mg), no refills.

## 2013-08-07 NOTE — Telephone Encounter (Signed)
Message copied by Agnes Lawrence on Mon Aug 07, 2013 10:42 AM ------      Message from: [place], [place]      Created: Mon Aug 07, 2013  9:11 AM       Thanks Elzie Rings or Darl Pikes, can you please check back on him today.  This has been a difficult social situation.            MM                  ----- Message -----         From: Lonie Peak, MD         Sent: 08/07/2013   7:40 AM           To: Oneita Hurt, MD, [facility] and [place]-      This pt's sister called yesterday pm reporting that her brother has called her c/o increased HA's and confusion since ending his decadron taper.  She was about to check on him at his home/.  I rec'd taking 4mg  decadron last night and this AM and told her to call in the AM to check in.  Sorry, I cannot recall his sister's name.  Darl Pikes, maybe you can find her on the calling list?  She also said her brother has been resentful and angry about his dx and she wasn't sure he'd let her supervise him that night or give him the medication.      SS       ------

## 2013-08-07 NOTE — Telephone Encounter (Signed)
Phoned patient. No answer. Phoned sister, Martin Martin. Explained Diflucan script had been called to [place] at [facility] in [place]. Stressed that he take all pills of Diflucan as directed and not to stop just because symptoms improve. Also, advise he continue decadron 2 mg BID for one week then, once daily until follow up appointment on 08/31/2013. She verbalized understanding. She reports that her brother has 15 4 mg decadron tablets left. Explained this Clinical research associate would request Dr. Kathrynn Running e-scribe more Decadron in a few days to [facility] in [place]. All questions answered. She verbalized understanding of all reviewed.

## 2013-08-07 NOTE — Telephone Encounter (Signed)
Patient's sister Martin Martin called asking for advice or something Martin Martin can take for his hands.  Asked if this is a side effect of the Temodar or the steroid taper.  "What can he take or do?  He can't use his hands.  It comes and goes but for the past five days his hands are cramping and cramps to his legs and feet too.  Hands are twisting or curling in and he couldn't use a can opener to open a can of soup.  He can barely get up.  He is not himself and is not thinking clearly."  Reports he took the Temodar 08-02-2013 through 08-06-2013.  Martin Martin can be reached via mobile number [phone].  Next scheduled f/u is 08-17-2013 with Dr. Welton Flakes and 08-31-2013 with Dr. Kathrynn Running.

## 2013-08-08 ENCOUNTER — Telehealth: Payer: Self-pay | Admitting: Medical Oncology

## 2013-08-08 DIAGNOSIS — C713 Malignant neoplasm of parietal lobe: Secondary | ICD-10-CM

## 2013-08-08 NOTE — Telephone Encounter (Signed)
Pt's sister called back, stated she spoke with patient and "he said that he is feeling much better, cramping is gone and even worked out in the yard today, he would like to wait till his scheduled appt on the 28th." Informed sister will cancel appt. Patient to call office should he have any questions or concerns.  NP informed.

## 2013-08-08 NOTE — Telephone Encounter (Signed)
F/u to Martin Martin regarding call to triage yesterday. Reviewed with Augustin Schooling, sister's concerns. Informed sister patient to be seen tomorrow @1 :15 with NP and labs to be drawn prior to appt. Pt to arrive @ 1 for labs. Ms. Manson Passey gave verbal understanding.  POF/Onc tx sent, labs ordered.

## 2013-08-09 ENCOUNTER — Other Ambulatory Visit: Payer: BC Managed Care – PPO | Admitting: Lab

## 2013-08-09 ENCOUNTER — Ambulatory Visit: Payer: BC Managed Care – PPO | Admitting: Adult Health

## 2013-08-11 ENCOUNTER — Telehealth: Payer: Self-pay | Admitting: Radiation Oncology

## 2013-08-11 NOTE — Telephone Encounter (Signed)
Lynden Ang reports that her brother, Richards, is doing better. Presently, he is taking decadron 2 mg in the morning an 2 mg at night. She reports his energy and appetite have improved. She reports that the "yeast in his mouth has gone away." She reports muscle aches continue but, Dorrance wants to wait until Dr. Welton Flakes returns to work to talk to her about them denying they are severe. Encouraged to contact staff with future needs and she verbalized understanding.

## 2013-08-11 NOTE — Telephone Encounter (Signed)
Phoned patient to assess status since resuming decadron and starting diflucan. No answer. Phoned patient's sister, Lynden Ang. No answer. Left message requesting return call.

## 2013-08-14 ENCOUNTER — Other Ambulatory Visit: Payer: BC Managed Care – PPO

## 2013-08-17 ENCOUNTER — Ambulatory Visit (HOSPITAL_BASED_OUTPATIENT_CLINIC_OR_DEPARTMENT_OTHER): Payer: BC Managed Care – PPO | Admitting: Oncology

## 2013-08-17 ENCOUNTER — Other Ambulatory Visit (HOSPITAL_BASED_OUTPATIENT_CLINIC_OR_DEPARTMENT_OTHER): Payer: BC Managed Care – PPO | Admitting: Lab

## 2013-08-17 ENCOUNTER — Telehealth: Payer: Self-pay | Admitting: Oncology

## 2013-08-17 ENCOUNTER — Encounter: Payer: Self-pay | Admitting: Oncology

## 2013-08-17 VITALS — BP 131/86 | HR 114 | Temp 98.9°F | Ht 73.0 in | Wt 192.1 lb

## 2013-08-17 DIAGNOSIS — C713 Malignant neoplasm of parietal lobe: Secondary | ICD-10-CM

## 2013-08-17 LAB — CBC WITH DIFFERENTIAL/PLATELET
Basophils Absolute: 0 10*3/uL (ref 0.0–0.1)
EOS%: 0.3 % (ref 0.0–7.0)
HCT: 41 % (ref 38.4–49.9)
HGB: 13.5 g/dL (ref 13.0–17.1)
LYMPH%: 15.2 % (ref 14.0–49.0)
MCH: 32.4 pg (ref 27.2–33.4)
MCV: 98.3 fL — ABNORMAL HIGH (ref 79.3–98.0)
MONO%: 11.2 % (ref 0.0–14.0)
NEUT%: 73.2 % (ref 39.0–75.0)

## 2013-08-17 LAB — COMPREHENSIVE METABOLIC PANEL (CC13)
AST: 19 U/L (ref 5–34)
Alkaline Phosphatase: 49 U/L (ref 40–150)
BUN: 19.1 mg/dL (ref 7.0–26.0)
Creatinine: 0.8 mg/dL (ref 0.7–1.3)

## 2013-08-17 MED ORDER — TEMOZOLOMIDE 250 MG PO CAPS: 250.0000 mg | ORAL_CAPSULE | Freq: Every day | ORAL | Status: DC

## 2013-08-17 NOTE — Patient Instructions (Addendum)
Doing well  Blood counts look good  We will see you back on 9/8 prior to start of cycle 3 of temodar

## 2013-08-17 NOTE — Progress Notes (Signed)
OFFICE PROGRESS NOTE  CC  Martin Heap, MD [address] [place] [address] Dr Martin Martin Dr. Margaretmary Martin  DIAGNOSIS: 57 year old gentleman with glioblastoma of the left parietal brain status post biopsy/debulking in April 2014  PRIOR THERAPY:  #1presented on 03/24/2013 with increasing severe disorientation and confusion. Because of this he was seen in the urgent care. He was noted to have some ataxia as well as confusion and speech difficulty with visual disturbances as well. He had a CT of the head without contrast that revealed 6.4 cm area of low attenuation with surrounding vasogenic edema in the left parietal lobe. An MRI performed showed a large partially necrotic 36 x 47 x 39 mm lesion with surrounding vasogenic edema. CT of the chest and abdomen showed no primary tumor. Patient went on to have left parietal brain biopsy and resection that showed WHO grade 4 glioblastoma. Postop brain MRI showed subtotal decompression with some mild postoperative hemorrhage centrally with vasogenic edema had resolved. He has been seen by Dr. Margaretmary Martin for radiation treatment and he is now seeing me for discussion regarding concurrent and adjuvant chemotherapy for glioblastoma  #2 post surgery patient was begun on concurrent radiation and adjuvant chemotherapy consisting of Temodar at 75 mg per meter squared. He completed radiation on 06/07/2013 overall he tolerated it well. He tolerated the Temodar very nicely as well. He is maintained on Bactrim DS.  #3 patient will initiate adjuvant Temodar at 150 mg per meter squared given on a 28 day cycle on days one through 5 of every cycle. His total doses 250 mg daily x5 days. We will initiate cycle 1 as soon as he gets his prescription filled.  CURRENT THERAPY: Cycle 3 Adjuvant Temodar 250 mg daily days 1 through 5 starting 08/28/2013 through 09/01/2013.  INTERVAL HISTORY: Martin Martin 57 y.o. male returns for followup visit. Overall  patient is doing much better. He completed cycle 2 of the Temodar. He tells me that he started having cramping in his arms and he had to call Dr. Kathrynn Martin and dose of his steroids was increased. Thereafter the cramping improved. He also is significantly fatigued with the second cycle of Temodar.Marland Kitchen He otherwise denies any nausea vomiting fevers chills night sweats headaches shortness of breath chest pains palpitations. He is accompanied by his sister today. Remainder of the 10 point review of systems is negative.  MEDICAL HISTORY: Past Medical History  Diagnosis Date  . Hyperlipidemia   . Right bundle branch block     Normal LV function normal aortic and mitral valve bedside echocardiogram 4 2012  . Tachycardia     Resolved  . Dyslipidemia   . Coronary artery disease (CAD) excluded     Cardiolite study 2006 within normal limits  . Hypertension   . Allergy     ALLERGIES:  is allergic to dilaudid and morphine and related.  MEDICATIONS:  Current Outpatient Prescriptions  Medication Sig Dispense Refill  . dexamethasone (DECADRON) 4 MG tablet Take 1 tablet (4 mg total) by mouth as directed. Take 1 pill twice daily for 2 weeks then 1/2 pill twice daily until further instructions  45 tablet  5  . diltiazem (CARDIZEM CD) 120 MG 24 hr capsule Take 1 capsule (120 mg total) by mouth daily.  90 capsule  3  . levETIRAcetam (KEPPRA) 500 MG tablet Take 500 mg by mouth at bedtime. Changed by Dr. Jeral Martin 04/11/13      . metoprolol tartrate (LOPRESSOR) 25 MG tablet Take 25  mg by mouth 2 (two) times daily.       . pantoprazole (PROTONIX) 40 MG tablet take 1 tablet by mouth once daily  30 tablet  6  . sulfamethoxazole-trimethoprim (BACTRIM DS,SEPTRA DS) 800-160 MG per tablet Take 1 pill daily  30 tablet  5  . ALPRAZolam (XANAX) 0.25 MG tablet Take 1 tablet (0.25 mg total) by mouth at bedtime as needed for sleep.  30 tablet  0  . calcium carbonate (TUMS - DOSED IN MG ELEMENTAL CALCIUM) 500 MG chewable tablet  Chew 1 tablet by mouth daily. For indigestion      . ondansetron (ZOFRAN) 8 MG tablet Take 1/2 hour prior to temodar daily  30 tablet  3  . temozolomide (TEMODAR) 250 MG capsule Take 1 capsule (250 mg total) by mouth daily. May take on an empty stomach or at bedtime to decrease nausea & vomiting.  5 capsule  4   No current facility-administered medications for this visit.    SURGICAL HISTORY:  Past Surgical History  Procedure Laterality Date  . Left inguinal hernia.  2004  . Craniotomy Left 03/29/2013    Procedure: CRANIOTOMY TUMOR EXCISION;  Surgeon: Karn Cassis, MD;  Location: [facility];  Service: Neurosurgery;  Laterality: Left;  Left Craniotomy for tumor excision    REVIEW OF SYSTEMS:  Pertinent items are noted in HPI.   PHYSICAL EXAMINATION: Blood pressure 131/86, pulse 114, temperature 98.9 F (37.2 C), temperature source Oral, height 6\' 1"  (1.854 m), weight 192 lb 1.6 oz (87.136 kg). Body mass index is 25.35 kg/(m^2). General: Patient is a well appearing male in no acute distress HEENT: PERRLA, sclerae anicteric no conjunctival pallor, MMM Neck: supple, no palpable adenopathy Lungs: clear to auscultation bilaterally, no wheezes, rhonchi, or rales Cardiovascular: regular rate rhythm, S1, S2, no murmurs, rubs or gallops Abdomen: Soft, non-tender, non-distended, normoactive bowel sounds, no HSM Extremities: warm and well perfused, no clubbing, cyanosis, or edema Skin: No rashes or lesions Neuro: Non-focal ECOG PERFORMANCE STATUS: 1 - Symptomatic but completely ambulatory    LABORATORY DATA: Lab Results  Component Value Date   WBC 7.8 08/17/2013   HGB 13.5 08/17/2013   HCT 41.0 08/17/2013   MCV 98.3* 08/17/2013   PLT 229 08/17/2013      Chemistry      Component Value Date/Time   NA 144 08/17/2013 0833   NA 136 03/30/2013 0430   K 4.4 08/17/2013 0833   K 4.4 03/30/2013 0430   CL 102 06/05/2013 1406   CL 100 03/30/2013 0430   CO2 25 08/17/2013 0833   CO2 29 03/30/2013  0430   BUN 19.1 08/17/2013 0833   BUN 21 03/30/2013 0430   CREATININE 0.8 08/17/2013 0833   CREATININE 0.81 03/30/2013 0430      Component Value Date/Time   CALCIUM 9.7 08/17/2013 0833   CALCIUM 8.5 03/30/2013 0430   ALKPHOS 49 08/17/2013 0833   ALKPHOS 50 03/29/2013 0500   AST 19 08/17/2013 0833   AST 19 03/29/2013 0500   ALT 45 08/17/2013 0833   ALT 101* 03/29/2013 0500   BILITOT 0.34 08/17/2013 0833   BILITOT 0.5 03/29/2013 0500       RADIOGRAPHIC STUDIES:  No results found.  ASSESSMENT: 57 year old gentleman with  #1 WHO grade 4 glioblastoma status post debulking. He went on to receive concurrent radiation therapy and Temodar 75 mg per meter squared. He completed radiation therapy from 04/27/2013 through 06/07/2013.  #2 patient to begin adjuvant Temodar at 175 mg  per meter squared. His total dose is 250 mg days 1 through 5 on a 28 day cycle. We explained the risks benefits side effects of this as well as rationale. I am not giving him Avastin at this point. I would like to see repeat MRI performed in the next few months prior to initiating Avastin.  PLAN:  #1 patient will proceed with cycle 3 days one through 5 of Temodar 250 mg daily x5 days.he will begin his treatments starting 08/28/2013 through 09/01/2013.  #2 I will plan on seeing him back on 08/28/2013.  #3 patient will continue to followup with Dr. Margaretmary Bayley.  All questions were answered. The patient knows to call the clinic with any problems, questions or concerns. We can certainly see the patient much sooner if necessary.  I spent 25 minutes counseling the patient face to face. The total time spent in the appointment was 30 minutes.  Drue Second, MD Medical/Oncology [address] [facility] [phone] (beeper) [phone] (Office)  08/17/2013, 9:31 AM

## 2013-08-17 NOTE — Telephone Encounter (Signed)
, °

## 2013-08-18 ENCOUNTER — Encounter: Payer: Self-pay | Admitting: Oncology

## 2013-08-18 ENCOUNTER — Ambulatory Visit: Payer: BC Managed Care – PPO | Admitting: Radiation Oncology

## 2013-08-22 NOTE — Progress Notes (Signed)
Faxed Temodar to [facility] [phone] Fax [phone].

## 2013-08-28 ENCOUNTER — Ambulatory Visit (HOSPITAL_BASED_OUTPATIENT_CLINIC_OR_DEPARTMENT_OTHER): Payer: BC Managed Care – PPO | Admitting: Oncology

## 2013-08-28 ENCOUNTER — Encounter: Payer: Self-pay | Admitting: Oncology

## 2013-08-28 ENCOUNTER — Other Ambulatory Visit (HOSPITAL_BASED_OUTPATIENT_CLINIC_OR_DEPARTMENT_OTHER): Payer: BC Managed Care – PPO | Admitting: Lab

## 2013-08-28 ENCOUNTER — Telehealth: Payer: Self-pay | Admitting: Oncology

## 2013-08-28 VITALS — BP 144/79 | HR 102 | Temp 97.1°F | Resp 18 | Ht 73.0 in | Wt 201.7 lb

## 2013-08-28 DIAGNOSIS — C713 Malignant neoplasm of parietal lobe: Secondary | ICD-10-CM

## 2013-08-28 LAB — CBC WITH DIFFERENTIAL/PLATELET
BASO%: 0.7 % (ref 0.0–2.0)
Basophils Absolute: 0.1 10*3/uL (ref 0.0–0.1)
EOS%: 0.1 % (ref 0.0–7.0)
HCT: 39.5 % (ref 38.4–49.9)
HGB: 13.5 g/dL (ref 13.0–17.1)
LYMPH%: 4.9 % — ABNORMAL LOW (ref 14.0–49.0)
MCH: 33.7 pg — ABNORMAL HIGH (ref 27.2–33.4)
MCHC: 34.1 g/dL (ref 32.0–36.0)
MONO#: 0.6 10*3/uL (ref 0.1–0.9)
NEUT%: 88.2 % — ABNORMAL HIGH (ref 39.0–75.0)
Platelets: 207 10*3/uL (ref 140–400)

## 2013-08-28 LAB — COMPREHENSIVE METABOLIC PANEL (CC13)
ALT: 52 U/L (ref 0–55)
BUN: 23.8 mg/dL (ref 7.0–26.0)
CO2: 28 mEq/L (ref 22–29)
Calcium: 9.2 mg/dL (ref 8.4–10.4)
Creatinine: 1 mg/dL (ref 0.7–1.3)
Total Bilirubin: 0.4 mg/dL (ref 0.20–1.20)

## 2013-08-28 LAB — TECHNOLOGIST REVIEW

## 2013-08-28 NOTE — Progress Notes (Signed)
OFFICE PROGRESS NOTE  CC  Martin Heap, MD [address] [place] [address] Dr Eliane Decree Dr. Margaretmary Dys  DIAGNOSIS: 57 year old gentleman with glioblastoma of the left parietal brain status post biopsy/debulking in April 2014  PRIOR THERAPY:  #1presented on 03/24/2013 with increasing severe disorientation and confusion. Because of this he was seen in the urgent care. He was noted to have some ataxia as well as confusion and speech difficulty with visual disturbances as well. He had a CT of the head without contrast that revealed 6.4 cm area of low attenuation with surrounding vasogenic edema in the left parietal lobe. An MRI performed showed a large partially necrotic 36 x 47 x 39 mm lesion with surrounding vasogenic edema. CT of the chest and abdomen showed no primary tumor. Patient went on to have left parietal brain biopsy and resection that showed WHO grade 4 glioblastoma. Postop brain MRI showed subtotal decompression with some mild postoperative hemorrhage centrally with vasogenic edema had resolved. He has been seen by Dr. Margaretmary Dys for radiation treatment and he is now seeing me for discussion regarding concurrent and adjuvant chemotherapy for glioblastoma  #2 post surgery patient was begun on concurrent radiation and adjuvant chemotherapy consisting of Temodar at 75 mg per meter squared. He completed radiation on 06/07/2013 overall he tolerated it well. He tolerated the Temodar very nicely as well. He is maintained on Bactrim DS.  #3 patient will initiate adjuvant Temodar at 150 mg per meter squared given on a 28 day cycle on days one through 5 of every cycle. His total doses 250 mg daily x5 days. We will initiate cycle 1 as soon as he gets his prescription filled.  CURRENT THERAPY: Cycle 3 Adjuvant Temodar 250 mg daily days 1 through 5 starting 08/28/2013 through 09/01/2013.  INTERVAL HISTORY: Martin Martin 57 y.o. male returns for followup visit. Overall  patient is doing much better. He completed cycle 2 of the Temodar. He tells me that he started having cramping in his arms and he had to call Dr. Kathrynn Running and dose of his steroids was increased. Thereafter the cramping improved. He also is significantly fatigued with the second cycle of Temodar.Marland Kitchen He otherwise denies any nausea vomiting fevers chills night sweats headaches shortness of breath chest pains palpitations. He is accompanied by his sister today. Remainder of the 10 point review of systems is negative.  MEDICAL HISTORY: Past Medical History  Diagnosis Date  . Hyperlipidemia   . Right bundle branch block     Normal LV function normal aortic and mitral valve bedside echocardiogram 4 2012  . Tachycardia     Resolved  . Dyslipidemia   . Coronary artery disease (CAD) excluded     Cardiolite study 2006 within normal limits  . Hypertension   . Allergy     ALLERGIES:  is allergic to dilaudid and morphine and related.  MEDICATIONS:  Current Outpatient Prescriptions  Medication Sig Dispense Refill  . ALPRAZolam (XANAX) 0.25 MG tablet Take 1 tablet (0.25 mg total) by mouth at bedtime as needed for sleep.  30 tablet  0  . calcium carbonate (TUMS - DOSED IN MG ELEMENTAL CALCIUM) 500 MG chewable tablet Chew 1 tablet by mouth daily. For indigestion      . dexamethasone (DECADRON) 4 MG tablet Take 1 tablet (4 mg total) by mouth as directed. Take 1 pill twice daily for 2 weeks then 1/2 pill twice daily until further instructions  45 tablet  5  . diltiazem (CARDIZEM  CD) 120 MG 24 hr capsule Take 1 capsule (120 mg total) by mouth daily.  90 capsule  3  . levETIRAcetam (KEPPRA) 500 MG tablet Take 500 mg by mouth at bedtime. Changed by Dr. Jeral Fruit 04/11/13      . metoprolol tartrate (LOPRESSOR) 25 MG tablet Take 25 mg by mouth 2 (two) times daily.       . ondansetron (ZOFRAN) 8 MG tablet Take 1/2 hour prior to temodar daily  30 tablet  3  . pantoprazole (PROTONIX) 40 MG tablet take 1 tablet by mouth  once daily  30 tablet  6  . sulfamethoxazole-trimethoprim (BACTRIM DS,SEPTRA DS) 800-160 MG per tablet Take 1 pill daily  30 tablet  5  . temozolomide (TEMODAR) 250 MG capsule Take 1 capsule (250 mg total) by mouth daily. May take on an empty stomach or at bedtime to decrease nausea & vomiting.  5 capsule  4   No current facility-administered medications for this visit.    SURGICAL HISTORY:  Past Surgical History  Procedure Laterality Date  . Left inguinal hernia.  2004  . Craniotomy Left 03/29/2013    Procedure: CRANIOTOMY TUMOR EXCISION;  Surgeon: Karn Cassis, MD;  Location: [facility];  Service: Neurosurgery;  Laterality: Left;  Left Craniotomy for tumor excision    REVIEW OF SYSTEMS:  Pertinent items are noted in HPI.   PHYSICAL EXAMINATION: Blood pressure 144/79, pulse 102, temperature 97.1 F (36.2 C), temperature source Oral, resp. rate 18, height 6\' 1"  (1.854 m), weight 201 lb 11.2 oz (91.491 kg). Body mass index is 26.62 kg/(m^2). General: Patient is a well appearing male in no acute distress HEENT: PERRLA, sclerae anicteric no conjunctival pallor, MMM Neck: supple, no palpable adenopathy Lungs: clear to auscultation bilaterally, no wheezes, rhonchi, or rales Cardiovascular: regular rate rhythm, S1, S2, no murmurs, rubs or gallops Abdomen: Soft, non-tender, non-distended, normoactive bowel sounds, no HSM Extremities: warm and well perfused, no clubbing, cyanosis, or edema Skin: No rashes or lesions Neuro: Non-focal ECOG PERFORMANCE STATUS: 1 - Symptomatic but completely ambulatory    LABORATORY DATA: Lab Results  Component Value Date   WBC 10.6* 08/28/2013   HGB 13.5 08/28/2013   HCT 39.5 08/28/2013   MCV 98.7* 08/28/2013   PLT 207 08/28/2013      Chemistry      Component Value Date/Time   NA 140 08/28/2013 1421   NA 136 03/30/2013 0430   K 4.1 08/28/2013 1421   K 4.4 03/30/2013 0430   CL 102 06/05/2013 1406   CL 100 03/30/2013 0430   CO2 28 08/28/2013 1421   CO2 29  03/30/2013 0430   BUN 23.8 08/28/2013 1421   BUN 21 03/30/2013 0430   CREATININE 1.0 08/28/2013 1421   CREATININE 0.81 03/30/2013 0430      Component Value Date/Time   CALCIUM 9.2 08/28/2013 1421   CALCIUM 8.5 03/30/2013 0430   ALKPHOS 50 08/28/2013 1421   ALKPHOS 50 03/29/2013 0500   AST 20 08/28/2013 1421   AST 19 03/29/2013 0500   ALT 52 08/28/2013 1421   ALT 101* 03/29/2013 0500   BILITOT 0.40 08/28/2013 1421   BILITOT 0.5 03/29/2013 0500       RADIOGRAPHIC STUDIES:  No results found.  ASSESSMENT: 57 year old gentleman with  #1 WHO grade 4 glioblastoma status post debulking. He went on to receive concurrent radiation therapy and Temodar 75 mg per meter squared. He completed radiation therapy from 04/27/2013 through 06/07/2013.  #2 patient to begin adjuvant Temodar  at 175 mg per meter squared. His total dose is 250 mg days 1 through 5 on a 28 day cycle. We explained the risks benefits side effects of this as well as rationale. I am not giving him Avastin at this point. I would like to see repeat MRI performed in the next few months prior to initiating Avastin.  PLAN:  #1 patient will proceed with cycle 3 days one through 5 of Temodar 250 mg daily x5 days.he will begin his treatments starting 08/28/2013 through 09/01/2013.  #2 I will plan on seeing him back on 08/28/2013.  #3 patient will continue to followup with Dr. Margaretmary Bayley.  All questions were answered. The patient knows to call the clinic with any problems, questions or concerns. We can certainly see the patient much sooner if necessary.  I spent 25 minutes counseling the patient face to face. The total time spent in the appointment was 30 minutes.  Drue Second, MD Medical/Oncology [facility] [phone] (beeper) [phone] (Office)  08/28/2013, 3:20 PM

## 2013-08-28 NOTE — Patient Instructions (Addendum)
Proceed with cycle 3 of adjuvant temodar days 9/8 - 9/12  I will see you back in 09/25/13

## 2013-08-31 ENCOUNTER — Ambulatory Visit
Admission: RE | Admit: 2013-08-31 | Discharge: 2013-08-31 | Disposition: A | Discharge status: Home / Self Care | Payer: BC Managed Care – PPO | Source: Ambulatory Visit | Attending: Radiation Oncology | Admitting: Radiation Oncology

## 2013-08-31 ENCOUNTER — Encounter: Payer: Self-pay | Admitting: *Deleted

## 2013-08-31 ENCOUNTER — Encounter: Payer: Self-pay | Admitting: Radiation Oncology

## 2013-08-31 VITALS — BP 136/76 | HR 109 | Temp 97.8°F | Resp 18 | Wt 202.7 lb

## 2013-08-31 DIAGNOSIS — C713 Malignant neoplasm of parietal lobe: Secondary | ICD-10-CM

## 2013-08-31 NOTE — Progress Notes (Signed)
Patient started temodar today. Reports that he took zofran before taking his temodar but, denies nausea and vomiting. No signs of thrush. Reports taking decadron 2 mg once a day. Reports that he only sleep maybe two hours per night. Edema of neck and moon face noted. Steady gait noted. Denies dizziness. Reports a gigantic appetite. Denies diplopia or ringing in the ears. Reports intermittent occipital headaches which tylenol resolves. Patient seems agitated today.

## 2013-08-31 NOTE — Progress Notes (Addendum)
RECEIVED A FAX FROM [facility] CONCERNING A CONFIRMATION OF PRESCRIPTION SHIPMENT FOR TEMOZOLOMIDE ON 08/29/13.

## 2013-08-31 NOTE — Progress Notes (Signed)
Radiation Oncology         [phone] ________________________________  Name: Martin Martin MRN: [id number]  Date: 08/31/2013  DOB: May 01, 1956  Follow-Up Visit Note  CC: [name], MD  Ernestina Penna, MD  Diagnosis:   57 year old gentleman with glioblastoma of the left parietal brain status post biopsy/debulking treated to 60 Gy with Temodar  Interval Since Last Radiation:  10  weeks  Narrative:  The patient returns today for routine follow-up.  Patient started temodar today. Reports that he took zofran before taking his temodar but, denies nausea and vomiting. No signs of thrush. Reports taking decadron 2 mg once a day. Reports that he only sleep maybe two hours per night. Edema of neck and moon face noted. Steady gait noted. Denies dizziness. Reports a gigantic appetite. Denies diplopia or ringing in the ears. Reports intermittent occipital headaches which tylenol resolves. Patient seems agitated today                              ALLERGIES:  is allergic to dilaudid and morphine and related.  Meds: Current Outpatient Prescriptions  Medication Sig Dispense Refill  . dexamethasone (DECADRON) 4 MG tablet Take 1 tablet (4 mg total) by mouth as directed. Take 1 pill twice daily for 2 weeks then 1/2 pill twice daily until further instructions  45 tablet  5  . diltiazem (CARDIZEM CD) 120 MG 24 hr capsule Take 1 capsule (120 mg total) by mouth daily.  90 capsule  3  . levETIRAcetam (KEPPRA) 500 MG tablet Take 500 mg by mouth at bedtime. Changed by Dr. Jeral Fruit 04/11/13      . metoprolol tartrate (LOPRESSOR) 25 MG tablet Take 25 mg by mouth 2 (two) times daily.       . ondansetron (ZOFRAN) 8 MG tablet Take 1/2 hour prior to temodar daily  30 tablet  3  . pantoprazole (PROTONIX) 40 MG tablet take 1 tablet by mouth once daily  30 tablet  6  . sulfamethoxazole-trimethoprim (BACTRIM DS,SEPTRA DS) 800-160 MG per tablet Take 1 pill daily  30 tablet  5  . temozolomide (TEMODAR) 250 MG capsule  Take 1 capsule (250 mg total) by mouth daily. May take on an empty stomach or at bedtime to decrease nausea & vomiting.  5 capsule  4  . ALPRAZolam (XANAX) 0.25 MG tablet Take 1 tablet (0.25 mg total) by mouth at bedtime as needed for sleep.  30 tablet  0  . calcium carbonate (TUMS - DOSED IN MG ELEMENTAL CALCIUM) 500 MG chewable tablet Chew 1 tablet by mouth daily. For indigestion       No current facility-administered medications for this encounter.    Physical Findings: The patient is in no acute distress. Patient is alert and oriented.  weight is 202 lb 11.2 oz (91.944 kg). His oral temperature is 97.8 F (36.6 C). His blood pressure is 136/76 and his pulse is 109. His respiration is 18 and oxygen saturation is 100%. .  No significant changes.  Lab Findings: Lab Results  Component Value Date   WBC 10.6* 08/28/2013   HGB 13.5 08/28/2013   HCT 39.5 08/28/2013   MCV 98.7* 08/28/2013   PLT 207 08/28/2013   Impression:  The patient is recovering from the effects of radiation.  He is tolerating dex at 2 mg QD.  Plan:  Taper to dex 2 mg QOD.  MRI on 10/27 with follow-up 10/30.  _____________________________________  Artist Pais Kathrynn Running, M.D.

## 2013-09-05 ENCOUNTER — Telehealth: Payer: Self-pay | Admitting: Radiation Oncology

## 2013-09-05 NOTE — Telephone Encounter (Signed)
Per Dr. Broadus John order called in Diflucan 100 mg po. Patient to take 2 tablets today then, one tablet qd for ten day. Spoke with [employer], pharmacist at [facility] in [place]. Phoned patient to inform him this had been done and would be ready for pick up in one hour. Patient verbalized understanding and expressed appreciation for the call.

## 2013-09-06 ENCOUNTER — Telehealth: Payer: Self-pay | Admitting: *Deleted

## 2013-09-06 NOTE — Telephone Encounter (Signed)
Finished his 5 days of Temodar on 09/04/13 and has become much weaker recently. Having more difficulty raising his right leg to walk. Persistent nausea. She called Dr. Kathrynn Running earlier today and with this information and was instructed to have Martin Martin increase his Decadron back to 2 mg daily (up from 2 mg qod). She is to call if he is not better in couple days and he may need a MRI scan sooner. This RN instructed her to tell Martin Martin he can take the Zofran bid if he needs to for persistent nausea since this is all he has on hand and it will not make him more drowsy and feel fatigued. Suggested next cycle he take the Temodar at night to help decrease the nausea. She reports that at present time his wife is not in the home/he is living alone and will not allow anyone to stay with him, but they check on him often. Instructed her to have Martin Martin add him to his release of information list next time they are in the office-his wife Martin Martin is only one on this at present time. She agrees to do so.

## 2013-09-07 ENCOUNTER — Telehealth: Payer: Self-pay | Admitting: Radiation Oncology

## 2013-09-07 NOTE — Telephone Encounter (Signed)
Patient's sister, Martin Martin, phoned this morning. She explained that she spoke with Dr. Kathrynn Running yesterday about the weakness in her brother's right leg. She reports that Dr. Kathrynn Running instructed her to have his brother increased decadron from 2 mg every other day to 2 mg every day. Martin Martin requested that this Clinical research associate inform Dr. Kathrynn Running her brother is feeling much better today and right leg weakness has improved therefore, they chose not to increase the steroids to every day. She confirms he began taking his diflucan yesterday as directed for thrush. Encouraged her to monitor the patient for episodes of confusion, headache, leg weakness, etc and to contact staff with future needs. She verbalized understanding.

## 2013-09-08 ENCOUNTER — Inpatient Hospital Stay (HOSPITAL_COMMUNITY)
Admission: AD | Admit: 2013-09-08 | Discharge: 2013-09-11 | DRG: 011 | Disposition: A | Discharge status: Home / Self Care | Payer: BC Managed Care – PPO | Source: Ambulatory Visit | Attending: Internal Medicine | Admitting: Internal Medicine

## 2013-09-08 ENCOUNTER — Other Ambulatory Visit: Payer: Self-pay | Admitting: Emergency Medicine

## 2013-09-08 ENCOUNTER — Inpatient Hospital Stay (HOSPITAL_COMMUNITY): Payer: BC Managed Care – PPO

## 2013-09-08 ENCOUNTER — Ambulatory Visit (HOSPITAL_BASED_OUTPATIENT_CLINIC_OR_DEPARTMENT_OTHER): Payer: BC Managed Care – PPO | Admitting: Adult Health

## 2013-09-08 ENCOUNTER — Ambulatory Visit (HOSPITAL_BASED_OUTPATIENT_CLINIC_OR_DEPARTMENT_OTHER): Payer: BC Managed Care – PPO | Admitting: Lab

## 2013-09-08 ENCOUNTER — Encounter (HOSPITAL_COMMUNITY): Payer: Self-pay

## 2013-09-08 VITALS — BP 131/82 | HR 85 | Temp 98.3°F | Resp 18 | Ht 72.0 in | Wt 200.1 lb

## 2013-09-08 DIAGNOSIS — Z79899 Other long term (current) drug therapy: Secondary | ICD-10-CM

## 2013-09-08 DIAGNOSIS — Z923 Personal history of irradiation: Secondary | ICD-10-CM

## 2013-09-08 DIAGNOSIS — I10 Essential (primary) hypertension: Secondary | ICD-10-CM

## 2013-09-08 DIAGNOSIS — I2510 Atherosclerotic heart disease of native coronary artery without angina pectoris: Secondary | ICD-10-CM | POA: Diagnosis present

## 2013-09-08 DIAGNOSIS — C719 Malignant neoplasm of brain, unspecified: Principal | ICD-10-CM | POA: Diagnosis present

## 2013-09-08 DIAGNOSIS — C713 Malignant neoplasm of parietal lobe: Secondary | ICD-10-CM

## 2013-09-08 DIAGNOSIS — E785 Hyperlipidemia, unspecified: Secondary | ICD-10-CM | POA: Diagnosis present

## 2013-09-08 DIAGNOSIS — R531 Weakness: Secondary | ICD-10-CM

## 2013-09-08 DIAGNOSIS — R569 Unspecified convulsions: Secondary | ICD-10-CM

## 2013-09-08 DIAGNOSIS — M6281 Muscle weakness (generalized): Secondary | ICD-10-CM

## 2013-09-08 DIAGNOSIS — Z87891 Personal history of nicotine dependence: Secondary | ICD-10-CM

## 2013-09-08 DIAGNOSIS — Z0389 Encounter for observation for other suspected diseases and conditions ruled out: Secondary | ICD-10-CM

## 2013-09-08 DIAGNOSIS — Z9221 Personal history of antineoplastic chemotherapy: Secondary | ICD-10-CM

## 2013-09-08 DIAGNOSIS — R29898 Other symptoms and signs involving the musculoskeletal system: Secondary | ICD-10-CM | POA: Diagnosis present

## 2013-09-08 DIAGNOSIS — G936 Cerebral edema: Secondary | ICD-10-CM | POA: Diagnosis present

## 2013-09-08 LAB — COMPREHENSIVE METABOLIC PANEL (CC13)
Albumin: 3.5 g/dL (ref 3.5–5.0)
BUN: 18.1 mg/dL (ref 7.0–26.0)
CO2: 25 mEq/L (ref 22–29)
Calcium: 9.4 mg/dL (ref 8.4–10.4)
Chloride: 106 mEq/L (ref 98–109)
Creatinine: 1 mg/dL (ref 0.7–1.3)
Glucose: 109 mg/dl (ref 70–140)
Potassium: 4.7 mEq/L (ref 3.5–5.1)

## 2013-09-08 LAB — CBC WITH DIFFERENTIAL/PLATELET
Basophils Absolute: 0.1 10*3/uL (ref 0.0–0.1)
Eosinophils Absolute: 0 10*3/uL (ref 0.0–0.5)
HCT: 39.8 % (ref 38.4–49.9)
HGB: 13.8 g/dL (ref 13.0–17.1)
MCH: 33.9 pg — ABNORMAL HIGH (ref 27.2–33.4)
MONO#: 0.7 10*3/uL (ref 0.1–0.9)
NEUT#: 6.1 10*3/uL (ref 1.5–6.5)
NEUT%: 78.3 % — ABNORMAL HIGH (ref 39.0–75.0)
RDW: 16.9 % — ABNORMAL HIGH (ref 11.0–14.6)
WBC: 7.8 10*3/uL (ref 4.0–10.3)
lymph#: 0.9 10*3/uL (ref 0.9–3.3)

## 2013-09-08 LAB — TECHNOLOGIST REVIEW

## 2013-09-08 MED ORDER — GADOBENATE DIMEGLUMINE 529 MG/ML IV SOLN
19.0000 mL | Freq: Once | INTRAVENOUS | Status: AC | PRN
  Administered 2013-09-08: 19 mL via INTRAVENOUS

## 2013-09-08 MED ORDER — DILTIAZEM HCL ER COATED BEADS 120 MG PO CP24
120.0000 mg | ORAL_CAPSULE | Freq: Every day | ORAL | Status: DC
  Administered 2013-09-09 – 2013-09-11 (×3): 120 mg via ORAL
  Filled 2013-09-08 (×3): qty 1

## 2013-09-08 MED ORDER — LEVETIRACETAM 500 MG PO TABS
500.0000 mg | ORAL_TABLET | Freq: Every day | ORAL | Status: DC
  Administered 2013-09-08 – 2013-09-10 (×3): 500 mg via ORAL
  Filled 2013-09-08 (×4): qty 1

## 2013-09-08 MED ORDER — SULFAMETHOXAZOLE-TMP DS 800-160 MG PO TABS
1.0000 | ORAL_TABLET | Freq: Every day | ORAL | Status: DC
  Administered 2013-09-09 – 2013-09-11 (×3): 1 via ORAL
  Filled 2013-09-08 (×3): qty 1

## 2013-09-08 MED ORDER — ONDANSETRON HCL 4 MG PO TABS
4.0000 mg | ORAL_TABLET | Freq: Four times a day (QID) | ORAL | Status: DC | PRN
  Administered 2013-09-09: 4 mg via ORAL
  Filled 2013-09-08: qty 1

## 2013-09-08 MED ORDER — ONDANSETRON HCL 4 MG/2ML IJ SOLN: 4.0000 mg | Freq: Four times a day (QID) | INTRAMUSCULAR | Status: DC | PRN

## 2013-09-08 MED ORDER — ALPRAZOLAM 0.25 MG PO TABS
0.2500 mg | ORAL_TABLET | Freq: Every evening | ORAL | Status: DC | PRN
  Administered 2013-09-08 – 2013-09-10 (×3): 0.25 mg via ORAL
  Filled 2013-09-08 (×3): qty 1

## 2013-09-08 MED ORDER — DEXAMETHASONE SODIUM PHOSPHATE 10 MG/ML IJ SOLN
10.0000 mg | Freq: Once | INTRAMUSCULAR | Status: AC
  Administered 2013-09-08: 10 mg via INTRAVENOUS
  Filled 2013-09-08 (×2): qty 1

## 2013-09-08 MED ORDER — DEXAMETHASONE SODIUM PHOSPHATE 4 MG/ML IJ SOLN
4.0000 mg | Freq: Four times a day (QID) | INTRAMUSCULAR | Status: DC
  Administered 2013-09-09 – 2013-09-11 (×11): 4 mg via INTRAVENOUS
  Filled 2013-09-08 (×14): qty 1

## 2013-09-08 MED ORDER — METOPROLOL TARTRATE 25 MG PO TABS
25.0000 mg | ORAL_TABLET | Freq: Two times a day (BID) | ORAL | Status: DC
  Administered 2013-09-08 – 2013-09-11 (×6): 25 mg via ORAL
  Filled 2013-09-08 (×7): qty 1

## 2013-09-08 MED ORDER — PANTOPRAZOLE SODIUM 40 MG PO TBEC
40.0000 mg | DELAYED_RELEASE_TABLET | Freq: Every day | ORAL | Status: DC
  Administered 2013-09-08 – 2013-09-10 (×4): 40 mg via ORAL
  Filled 2013-09-08 (×5): qty 1

## 2013-09-08 NOTE — Progress Notes (Addendum)
OFFICE PROGRESS NOTE  CC  Martin Heap, MD [address] [place] [address] Dr Eliane Decree Dr. Margaretmary Dys  DIAGNOSIS: 57 year old gentleman with glioblastoma of the left parietal brain status post biopsy/debulking in April 2014  PRIOR THERAPY:  #1presented on 03/24/2013 with increasing severe disorientation and confusion. Because of this he was seen in the urgent care. He was noted to have some ataxia as well as confusion and speech difficulty with visual disturbances as well. He had a CT of the head without contrast that revealed 6.4 cm area of low attenuation with surrounding vasogenic edema in the left parietal lobe. An MRI performed showed a large partially necrotic 36 x 47 x 39 mm lesion with surrounding vasogenic edema. CT of the chest and abdomen showed no primary tumor. Patient went on to have left parietal brain biopsy and resection that showed WHO grade 4 glioblastoma. Postop brain MRI showed subtotal decompression with some mild postoperative hemorrhage centrally with vasogenic edema had resolved. He has been seen by Dr. Margaretmary Dys for radiation treatment and he is now seeing me for discussion regarding concurrent and adjuvant chemotherapy for glioblastoma  #2 post surgery patient was begun on concurrent radiation and adjuvant chemotherapy consisting of Temodar at 75 mg per meter squared. He completed radiation on 06/07/2013 overall he tolerated it well. He tolerated the Temodar very nicely as well. He is maintained on Bactrim DS.  #3 patient will initiate adjuvant Temodar at 150 mg per meter squared given on a 28 day cycle on days one through 5 of every cycle. His total doses 250 mg daily x5 days. We will initiate cycle 1 as soon as he gets his prescription filled.  CURRENT THERAPY: Cycle 3 Adjuvant Temodar 250 mg daily days 1 through 5 starting 08/28/2013 through 09/01/2013.  INTERVAL HISTORY: Martin Martin 57 y.o. male returns for followup visit.  He is not  doing well today.  He is with his sister today.  About three days ago he started noticing weakness changes in his right lower extremity and mildly so in his arm.  He has also been slightly more confused, with inappropriate conversation, and not understanding clearly what is being communicated to him.  This has been worsening over the past 3 days.  He denies numbness, vision changes, speech changes, headaches, seizure activity.  He is having difficulty walking, and is living alone currently.  His sister is very worried because he is very argumentative and she cannot stay with him.     MEDICAL HISTORY: Past Medical History  Diagnosis Date  . Hyperlipidemia   . Right bundle branch block     Normal LV function normal aortic and mitral valve bedside echocardiogram 4 2012  . Tachycardia     Resolved  . Dyslipidemia   . Coronary artery disease (CAD) excluded     Cardiolite study 2006 within normal limits  . Hypertension   . Allergy     ALLERGIES:  is allergic to dilaudid and morphine and related.  MEDICATIONS:  No current facility-administered medications for this visit.   No current outpatient prescriptions on file.   Facility-Administered Medications Ordered in Other Visits  Medication Dose Route Frequency Provider Last Rate Last Dose  . ALPRAZolam Prudy Feeler) tablet 0.25 mg  0.25 mg Oral QHS PRN Kathlen Mody, MD   0.25 mg at 09/08/13 2123  . dexamethasone (DECADRON) injection 4 mg  4 mg Intravenous Q6H Kathlen Mody, MD   4 mg at 09/09/13 0636  . diltiazem (CARDIZEM  CD) 24 hr capsule 120 mg  120 mg Oral Daily Kathlen Mody, MD      . levETIRAcetam (KEPPRA) tablet 500 mg  500 mg Oral QHS Kathlen Mody, MD   500 mg at 09/08/13 2123  . metoprolol tartrate (LOPRESSOR) tablet 25 mg  25 mg Oral BID Kathlen Mody, MD   25 mg at 09/08/13 2123  . ondansetron (ZOFRAN) tablet 4 mg  4 mg Oral Q6H PRN Kathlen Mody, MD       Or  . ondansetron (ZOFRAN) injection 4 mg  4 mg Intravenous Q6H PRN Kathlen Mody, MD       . pantoprazole (PROTONIX) EC tablet 40 mg  40 mg Oral Daily Kathlen Mody, MD   40 mg at 09/08/13 1829  . sulfamethoxazole-trimethoprim (BACTRIM DS) 800-160 MG per tablet 1 tablet  1 tablet Oral Daily Kathlen Mody, MD        SURGICAL HISTORY:  Past Surgical History  Procedure Laterality Date  . Left inguinal hernia.  2004  . Craniotomy Left 03/29/2013    Procedure: CRANIOTOMY TUMOR EXCISION;  Surgeon: Karn Cassis, MD;  Location: [facility];  Service: Neurosurgery;  Laterality: Left;  Left Craniotomy for tumor excision    REVIEW OF SYSTEMS:  A 10 point review of systems was conducted and is otherwise negative except for what is noted above.     PHYSICAL EXAMINATION: Blood pressure 131/82, pulse 85, temperature 98.3 F (36.8 C), temperature source Oral, resp. rate 18, height 6' (1.829 m), weight 200 lb 1.6 oz (90.765 kg). Body mass index is 27.13 kg/(m^2). General: Patient is a well appearing male in no acute distress HEENT: PERRLA, sclerae anicteric no conjunctival pallor, MMM Neck: supple, no palpable adenopathy Lungs: clear to auscultation bilaterally, no wheezes, rhonchi, or rales Cardiovascular: regular rate rhythm, S1, S2, no murmurs, rubs or gallops Abdomen: Soft, non-tender, non-distended, normoactive bowel sounds, no HSM Extremities: warm and well perfused, no clubbing, cyanosis, or edema Skin: No rashes or lesions Neuro: CN II-XII intact, difficulty with cerebellar exam, difficulty with following commands ECOG PERFORMANCE STATUS: 1 - Symptomatic but completely ambulatory    LABORATORY DATA: Lab Results  Component Value Date   WBC 7.8 09/08/2013   HGB 13.8 09/08/2013   HCT 39.8 09/08/2013   MCV 98.0 09/08/2013   PLT 227 09/08/2013      Chemistry      Component Value Date/Time   NA 142 09/08/2013 1148   NA 136 03/30/2013 0430   K 4.7 09/08/2013 1148   K 4.4 03/30/2013 0430   CL 102 06/05/2013 1406   CL 100 03/30/2013 0430   CO2 25 09/08/2013 1148   CO2 29  03/30/2013 0430   BUN 18.1 09/08/2013 1148   BUN 21 03/30/2013 0430   CREATININE 1.0 09/08/2013 1148   CREATININE 0.81 03/30/2013 0430      Component Value Date/Time   CALCIUM 9.4 09/08/2013 1148   CALCIUM 8.5 03/30/2013 0430   ALKPHOS 53 09/08/2013 1148   ALKPHOS 50 03/29/2013 0500   AST 22 09/08/2013 1148   AST 19 03/29/2013 0500   ALT 61* 09/08/2013 1148   ALT 101* 03/29/2013 0500   BILITOT 0.37 09/08/2013 1148   BILITOT 0.5 03/29/2013 0500       RADIOGRAPHIC STUDIES:  No results found.  ASSESSMENT: 57 year old gentleman with  #1 WHO grade 4 glioblastoma status post debulking. He went on to receive concurrent radiation therapy and Temodar 75 mg per meter squared. He completed radiation therapy from 04/27/2013  through 06/07/2013.  #2 patient to begin adjuvant Temodar at 175 mg per meter squared. His total dose is 250 mg days 1 through 5 on a 28 day cycle. We explained the risks benefits side effects of this as well as rationale.  PLAN:  #1 The patient's presentation is very concerning for progression of disease.  I communicated that with him and his sister. Due to the new weakness, confusion, and the patient living alone, we will consult the hospitalists for admission.  He will need a STAT MRI brain, IV dexamethasone, as well as a physical therapy evaluation.  We will follow along with them.    All questions were answered. The patient knows to call the clinic with any problems, questions or concerns. We can certainly see the patient much sooner if necessary.  I spent 25 minutes counseling the patient face to face. The total time spent in the appointment was 30 minutes.  Cherie Ouch Lyn Hollingshead, NP Medical Oncology [facility] Phone: [phone] 09/09/2013, 8:55 AM  ATTENDING'S ATTESTATION:  I personally reviewed patient's chart, examined patient myself, formulated the treatment plan as followed.    I do believe that patient has progressive disease. He will be admitted for  disorientation and weakness in his lower extremities. I have recommended that we increase the dose of the Decadron. We will also obtain MRI when he is in the hospital. He will eventually need Avastin and we may decide to put a Port-A-Cath in in the next few weeks. I will suite to see what the MRI shows.  Drue Second, MD Medical/Oncology [facility] [phone] (beeper) [phone] (Office)  09/10/2013, 11:45 PM

## 2013-09-08 NOTE — H&P (Addendum)
[facility] History and Physical  [name]:[id number] DOB: Jun 13, 1956 DOA: 09/08/2013  Referring physician: Dr Welton Flakes PCP: [name], MD  Specialists: Dr Welton Flakes  Chief Complaint: right side weakness since 3 days.   Martin Martin:Martin Martin 58 y.o. male returns today for a followup visit with Dr Welton Flakes. He has a h/o of glioblastoma of the left parietal brain status post biopsy/debulking in April 2014.  Overall patient is doing much better. He completed cycle 2 of the Temodar. He  started having cramping in his arms and he had to call Dr. Kathrynn Running and dose of his steroids was increased. Thereafter the cramping improved, and he had underwent second cycle of temodar. Since then he reports his right leg is weaker and had to lift the leg to walk. He denies any other complaints. He was referred to hospitalist service for admission to evaluate for progression of glioblastoma vs steroid withdrawal vs myopathy    Review of Systems: The patient denies anorexia, fever, weight loss,, vision loss, decreased hearing, hoarseness, chest pain, syncope, dyspnea on exertion, peripheral edema, balance deficits, hemoptysis, abdominal pain, melena, hematochezia, , hematuria, incontinence, genital sores, muscle weakness, suspicious skin lesions, transient blindness,  unusual weight change, abnormal bleeding, enlarged lymph nodes, angioedema, and breast masses.    Past Medical History  Diagnosis Date  . Hyperlipidemia   . Right bundle branch block     Normal LV function normal aortic and mitral valve bedside echocardiogram 4 2012  . Tachycardia     Resolved  . Dyslipidemia   . Coronary artery disease (CAD) excluded     Cardiolite study 2006 within normal limits  . Hypertension   . Allergy    Past Surgical History  Procedure Laterality Date  . Left inguinal hernia.  2004  . Craniotomy Left 03/29/2013    Procedure: CRANIOTOMY TUMOR EXCISION;  Surgeon: Karn Cassis, MD;  Location: [facility];   Service: Neurosurgery;  Laterality: Left;  Left Craniotomy for tumor excision   Social History:  reports that he quit smoking about 8 years ago. His smoking use included Cigarettes. He has a 30 pack-year smoking history. He has never used smokeless tobacco. He reports that he does not drink alcohol or use illicit drugs. where does patient live--home with wife  Allergies  Allergen Reactions  . Dilaudid [Hydromorphone Hcl] Nausea And Vomiting  . Morphine And Related Nausea And Vomiting    History reviewed. No pertinent family history.  Prior to Admission medications   Medication Sig Start Date End Date Taking? Authorizing Provider  ALPRAZolam (XANAX) 0.25 MG tablet Take 1 tablet (0.25 mg total) by mouth at bedtime as needed for sleep. 04/24/13   Victorino December, MD  calcium carbonate (TUMS - DOSED IN MG ELEMENTAL CALCIUM) 500 MG chewable tablet Chew 1 tablet by mouth daily. For indigestion    Historical Provider, MD  dexamethasone (DECADRON) 4 MG tablet Take 2 mg by mouth every other day. Take 1 pill twice daily for 2 weeks then 1/2 pill twice daily until further instructions 06/21/13   Oneita Hurt, MD  diltiazem (CARDIZEM CD) 120 MG 24 hr capsule Take 1 capsule (120 mg total) by mouth daily. 03/04/12   June Leap, MD  levETIRAcetam (KEPPRA) 500 MG tablet Take 500 mg by mouth at bedtime. Changed by Dr. Jeral Fruit 04/11/13 04/02/13   Barnett Abu, MD  metoprolol tartrate (LOPRESSOR) 25 MG tablet Take 25 mg by mouth 2 (two) times daily.  03/20/13   Historical  Provider, MD  ondansetron [facility]) 8 MG tablet Take 1/2 hour prior to temodar daily 04/24/13   Victorino December, MD  pantoprazole (PROTONIX) 40 MG tablet take 1 tablet by mouth once daily 07/28/13   Victorino December, MD  sulfamethoxazole-trimethoprim (BACTRIM DS,SEPTRA DS) 800-160 MG per tablet Take 1 pill daily 04/24/13   Victorino December, MD  temozolomide (TEMODAR) 250 MG capsule Take 1 capsule (250 mg total) by mouth daily. May take on an empty stomach  or at bedtime to decrease nausea & vomiting. 08/17/13   Victorino December, MD   Physical Exam: Filed Vitals:   09/08/13 1438  BP: 133/105  Pulse: 104  Temp: 97.6 F (36.4 C)  Resp: 18    Constitutional: Vital signs reviewed.  Patient is a well-developed and well-nourished  in no acute distress and cooperative with exam. Alert and oriented x3.  Head: Normocephalic and atraumatic Mouth: no erythema or exudates, MMM Eyes: PERRL, EOMI, conjunctivae normal, No scleral icterus.  Neck: Supple, Trachea midline normal ROM, No JVD, mass, thyromegaly, or carotid bruit present.  Cardiovascular: RRR, S1 normal, S2 normal, no MRG, pulses symmetric and intact bilaterally Pulmonary/Chest: normal respiratory effort, CTAB, no wheezes, rales, or rhonchi Abdominal: Soft. Non-tender, non-distended, bowel sounds are normal, no masses, organomegaly, or guarding present.  Musculoskeletal: No joint deformities, erythema, or stiffness, ROM full and no nontender Neurological: A&O x3, , right lower extremity strength is 4/5, no speech abnormalities, no facial droop sensory intact to light touch bilaterally.  Skin: Warm, dry and intact. No rash, cyanosis, or clubbing.  Psychiatric: slightly anxious.     Labs on Admission:  Basic Metabolic Panel:  Recent Labs Lab 09/08/13 1148  NA 142  K 4.7  CO2 25  GLUCOSE 109  BUN 18.1  CREATININE 1.0  CALCIUM 9.4   Liver Function Tests:  Recent Labs Lab 09/08/13 1148  AST 22  ALT 61*  ALKPHOS 53  BILITOT 0.37  PROT 6.9  ALBUMIN 3.5   No results found for this basename: LIPASE, AMYLASE,  in the last 168 hours No results found for this basename: AMMONIA,  in the last 168 hours CBC:  Recent Labs Lab 09/08/13 1148  WBC 7.8  NEUTROABS 6.1  HGB 13.8  HCT 39.8  MCV 98.0  PLT 227   Cardiac Enzymes: No results found for this basename: CKTOTAL, CKMB, CKMBINDEX, TROPONINI,  in the last 168 hours  BNP (last 3 results) No results found for this basename:  PROBNP,  in the last 8760 hours CBG: No results found for this basename: GLUCAP,  in the last 168 hours  Radiological Exams on Admission: No results found.  WUJ:[id number].   Assessment/Plan Active Problems:  1. Right side weakness, and difficulty walking. :  - admit to med surg, possibly from steroid withdrawal . Vs progression of the disease.  - MRI BRAIN - CK levels - iv decadron 4 mg q6hrs.  - PT consult.   2. Hypertension: controlled. Resume home medications.   DVT prophylaxis.   Code Status: full code Family Communication: sister at bedside Disposition Plan: pending.   Time spent: 70 minutes  Emir Nack [facility] Pager [phone]  If 7PM-7AM, please contact night-coverage [email or web address] Password [facility] 09/08/2013, 3:23 PM      Addendum: called Neuro surgery on call regarding the abnormal MRI brain. Dr Yetta Barre will see the films in am and get back to [place].    Kathlen Mody, MD.

## 2013-09-09 DIAGNOSIS — M6281 Muscle weakness (generalized): Secondary | ICD-10-CM

## 2013-09-09 DIAGNOSIS — R569 Unspecified convulsions: Secondary | ICD-10-CM

## 2013-09-09 DIAGNOSIS — R930 Abnormal findings on diagnostic imaging of skull and head, not elsewhere classified: Secondary | ICD-10-CM

## 2013-09-09 MED ORDER — LORAZEPAM 2 MG/ML IJ SOLN
1.0000 mg | Freq: Once | INTRAMUSCULAR | Status: AC
  Administered 2013-09-09: 0.5 mg via INTRAVENOUS
  Filled 2013-09-09: qty 1

## 2013-09-09 MED ORDER — LORAZEPAM 2 MG/ML IJ SOLN: 1.0000 mg | Freq: Every evening | INTRAMUSCULAR | Status: DC | PRN

## 2013-09-09 NOTE — Progress Notes (Signed)
Martin Martin   DOB:11-14-1956   OF#:[id number]   DPO#:[id number]  Subjective: Patient looks remarkably better this morning. He still is a little bit weak in the feet. However I do think that the Decadron has helped him significantly. I have reviewed his MRI report and it looks like he may have progressive disease. Neurosurgery was called by the [facility]. Patient has not had any other nausea vomiting fevers chills or night sweats overnight his vitals remained stable.   Objective:  Filed Vitals:   09/09/13 1438  BP: 132/82  Pulse: 96  Temp: 98.1 F (36.7 C)  Resp: 20    Body mass index is 27.13 kg/(m^2).  Intake/Output Summary (Last 24 hours) at 09/09/13 1723 Last data filed at 09/09/13 1300  Gross per 24 hour  Intake   1280 ml  Output      0 ml  Net   1280 ml     Sclerae unicteric  Oropharynx clear  No peripheral adenopathy  Lungs clear -- no rales or rhonchi  Heart regular rate and rhythm  Abdomen benign  MSK no focal spinal tenderness, no peripheral edema  Neuro alert and oriented to person time space residual right-sided weakness    CBG (last 3)  No results found for this basename: GLUCAP,  in the last 72 hours   Labs:  Lab Results  Component Value Date   WBC 7.8 09/08/2013   HGB 13.8 09/08/2013   HCT 39.8 09/08/2013   MCV 98.0 09/08/2013   PLT 227 09/08/2013   NEUTROABS 6.1 09/08/2013    Urine Studies No results found for this basename: UACOL, UAPR, USPG, UPH, UTP, UGL, UKET, UBIL, UHGB, UNIT, UROB, ULEU, UEPI, UWBC, URBC, UBAC, CAST, CRYS, UCOM, BILUA,  in the last 72 hours  Basic Metabolic Panel:  Recent Labs Lab 09/08/13 1148  NA 142  K 4.7  CO2 25  GLUCOSE 109  BUN 18.1  CREATININE 1.0  CALCIUM 9.4   GFR Estimated Creatinine Clearance: 89.5 ml/min (by C-G formula based on Cr of 1). Liver Function Tests:  Recent Labs Lab 09/08/13 1148  AST 22  ALT 61*  ALKPHOS 53  BILITOT 0.37  PROT 6.9  ALBUMIN 3.5   No results found for  this basename: LIPASE, AMYLASE,  in the last 168 hours No results found for this basename: AMMONIA,  in the last 168 hours Coagulation profile No results found for this basename: INR, PROTIME,  in the last 168 hours  CBC:  Recent Labs Lab 09/08/13 1148  WBC 7.8  NEUTROABS 6.1  HGB 13.8  HCT 39.8  MCV 98.0  PLT 227   Cardiac Enzymes:  Recent Labs Lab 09/08/13 1557  CKTOTAL 63   BNP: No components found with this basename: POCBNP,  CBG: No results found for this basename: GLUCAP,  in the last 168 hours D-Dimer No results found for this basename: DDIMER,  in the last 72 hours Hgb A1c No results found for this basename: HGBA1C,  in the last 72 hours Lipid Profile No results found for this basename: CHOL, HDL, LDLCALC, TRIG, CHOLHDL, LDLDIRECT,  in the last 72 hours Thyroid function studies No results found for this basename: TSH, T4TOTAL, FREET3, T3FREE, THYROIDAB,  in the last 72 hours Anemia work up No results found for this basename: VITAMINB12, FOLATE, FERRITIN, TIBC, IRON, RETICCTPCT,  in the last 72 hours Microbiology Recent Results (from the past 240 hour(s))  TECHNOLOGIST REVIEW     Status: None   Collection Time  09/08/13 11:48 AM      Result Value Range Status   Technologist Review Rare meta   Final      Studies:  Mr Kizzie Fantasia Contrast  09/08/2013   CLINICAL DATA:  Glioblastoma followup. Right-sided weakness  EXAM: MRI HEAD WITHOUT AND WITH CONTRAST  TECHNIQUE: Multiplanar, multiecho pulse sequences of the brain and surrounding structures were obtained according to standard protocol without and with intravenous contrast  CONTRAST:  70mL MULTIHANCE GADOBENATE DIMEGLUMINE 529 MG/ML IV SOLN  COMPARISON:  MRI 07/20/2013  FINDINGS: Left parietal craniotomy for resection of mass lesion. There is marked progression of edema. There is now a large amount of white matter edema around the mass with local mass effect. 6 mm midline shift to the right has developed since  the the prior study when there was no shift. Hemosiderin lines the surgical cavity. The surgical cavity shows progressive enhancement since prior study. There is thick irregular enhancement of the wall with central necrosis in the cavity. The mass is slightly larger now measuring 56 x 41 mm in transverse dimension compared with 53 x 40 mm previously. The mass measures 47 mm in height which is similar to the prior study. The main difference is increase in edema and progressive thickening and enhancement of the wall of the mass.  Negative for acute infarct. There is some restricted diffusion within the cavity due to hemorrhage and tumor. Ventricle size are normal.  IMPRESSION: Left parietal mass lesion shows progression of enhancing tissue in the wall of the mass with central necrosis. There is a large amount of white matter edema which has progressed and there is now 6 mm midline shift which has developed since the the prior study. Findings are most compatible with tumor progression. Radiation necrosis considered less likely.   Electronically Signed   By: Franchot Gallo M.D.   On: 09/08/2013 17:33    Assessment: 57 y.o. with diagnosis of GBM he is status post resection in April 2014 followed by concurrent radiation and chemotherapy with Temodar. He subsequently was being treated with Temodar adjuvantly on a 28 day cycle. Now with increasing weakness in the right lower extremity. MRI of the brain was performed during workup he showed left parietal mass lesion progression in the wall of the surgical cavity with central necrosis with midline shift. Patient is on Decadron his clinical symptoms are improving.    Plan:   #1 continue Decadron  #2 physical therapy will be important.  #3 my recommendation eventually will be to start the patient on Avastin as an outpatient for ongoing therapy.  #4 we will await Dr. Harley Hallmark input regarding role of further surgery.  #5 I will also let Dr. Tammi Klippel know of the  MRI findings.   Marcy Panning 09/09/2013

## 2013-09-09 NOTE — Progress Notes (Signed)
Physical Therapy Treatment Patient Details Name: Martin Martin MRN: [id number] DOB: [birth year] Today's Date: 09/09/2013 Time: 4098-1191 PT Time Calculation (min): 15 min  PT Assessment / Plan / Recommendation  History of Present Illness Martin Martin 57 y.o. male admitted for porgressive weakness of RLE. He has a h/o of glioblastoma of the left parietal brain status post biopsy/debulking in April 2014.   PT Comments   Pt had difficulty understanding written home exercise program.  Hard copy left in chart and sister was present for the instruction  Follow Up Recommendations  No PT follow up     Does the patient have the potential to tolerate intense rehabilitation     Barriers to Discharge        Equipment Recommendations  Cane    Recommendations for Other Services OT consult  Frequency Min 3X/week   Progress towards PT Goals Progress towards PT goals: Progressing toward goals  Plan      Precautions / Restrictions     Pertinent Vitals/Pain No c/o pain    Mobility       Exercises Other Exercises Other Exercises: pt issued home program for beginning core and LE exercise.  Pt had some difficutly understanding directions on paper and coordinating exercise.  Sister was in the room and acknowledged that she will encourage pt to have frequent short bouts of activity at home   PT Diagnosis: Difficulty walking;Abnormality of gait  PT Problem List: Decreased coordination;Decreased activity tolerance;Decreased safety awareness PT Treatment Interventions:     PT Goals (current goals can now be found in the care plan section) Acute Rehab PT Goals Patient Stated Goal: to go home ..pt does not want a RW or HHPT PT Goal Formulation: With patient Potential to Achieve Goals: Good  Visit Information  Last PT Received On: 09/09/13 Assistance Needed: +1 History of Present Illness: Martin Martin 57 y.o. male admitted for porgressive weakness of RLE. He has a h/o of glioblastoma of  the left parietal brain status post biopsy/debulking in April 2014.    Subjective Data  Patient Stated Goal: to go home ..pt does not want a RW or HHPT   Cognition       Balance     End of Session PT - End of Session Activity Tolerance: Patient tolerated treatment well Patient left: in chair;with family/visitor present Nurse Communication: Mobility status   GP    Martin Martin, Martin Martin [phone] 09/09/2013, 4:04 PM

## 2013-09-09 NOTE — Evaluation (Signed)
Physical Therapy Evaluation Patient Details Name: Martin Martin MRN: [id number] DOB: 1956-11-20 Today's Date: 09/09/2013 Time: 4098-1191 PT Time Calculation (min): 25 min  PT Assessment / Plan / Recommendation History of Present Illness  Martin Martin 57 y.o. male admitted for progressive weakness of RLE. He has a h/o of glioblastoma of the left parietal brain status post biopsy/debulking in April 2014.  Clinical Impression  Pt has decreased sensation and coordination in RLE with impaired movement patterns and impulsiveness on eval today.  He wants to use a straight cane at home and will do frequent bouts of activity with his wife instead of formal HHPT program. Will provide home program next visit    PT Assessment  Patient needs continued PT services    Follow Up Recommendations  No PT follow up    Does the patient have the potential to tolerate intense rehabilitation      Barriers to Discharge        Equipment Recommendations  Cane    Recommendations for Other Services OT consult   Frequency Min 3X/week    Precautions / Restrictions Precautions Precautions: Fall   Pertinent Vitals/Pain No c/o pain, but decreased sensation in RLE      Mobility  Bed Mobility Bed Mobility: Rolling Right;Rolling Left;Supine to Sit Rolling Right: 5: Supervision Rolling Left: 5: Supervision Supine to Sit: 4: Min assist Details for Bed Mobility Assistance: pt is able to perform activity, but movement is impulsive and uncoordinated Pt had most difficulty with figureing out how to get from supine to sit and this activity required increased exertion Transfers Transfers: Sit to Stand;Stand to Sit Sit to Stand: 5: Supervision Stand to Sit: 5: Supervision Ambulation/Gait Ambulation/Gait Assistance: 4: Min guard Ambulation Distance (Feet): 200 Feet Assistive device: Rolling walker;Straight cane Ambulation/Gait Assistance Details: assist for safety Gait Pattern: Step-through  pattern;Ataxic Gait velocity: impulsive General Gait Details: pt takes very large steps with a quick pace and so tends to lack control  Attmepted use of RW and pt was somewhat safer with it, but he admits he will not use it at home Stairs: No Wheelchair Mobility Wheelchair Mobility: No    Exercises General Exercises - Lower Extremity Ankle Circles/Pumps: AROM;Both;5 reps Gluteal Sets: AROM;Both;5 reps Hip ABduction/ADduction: AROM;Both;Sidelying;Standing;10 reps Hip Flexion/Marching: AROM;Both;10 reps;Sidelying;Standing   PT Diagnosis: Difficulty walking;Abnormality of gait  PT Problem List: Decreased coordination;Decreased activity tolerance;Decreased safety awareness PT Treatment Interventions: Gait training;Stair training;Functional mobility training;Therapeutic activities;Therapeutic exercise;Balance training;DME instruction;Patient/family education     PT Goals(Current goals can be found in the care plan section) Acute Rehab PT Goals Patient Stated Goal: to go home ..pt does not want a RW or HHPT PT Goal Formulation: With patient Potential to Achieve Goals: Good  Visit Information  Last PT Received On: 09/09/13 Assistance Needed: +1 History of Present Illness: Martin Martin 57 y.o. male admitted for porgressive weakness of RLE. He has a h/o of glioblastoma of the left parietal brain status post biopsy/debulking in April 2014.       Prior Functioning  Home Living Family/patient expects to be discharged to:: Private residence Living Arrangements: Spouse/significant other Available Help at Discharge: Friend(s) Type of Home: House Home Equipment: Other (comment) (pt states he's been using a "stick") Prior Function Level of Independence: Independent with assistive device(s) Communication Communication: Expressive difficulties (word finding problems)    Cognition  Cognition Arousal/Alertness: Awake/alert Behavior During Therapy: Impulsive Overall Cognitive Status: No  family/caregiver present to determine baseline cognitive functioning Memory: Decreased short-term memory  Extremity/Trunk Assessment Lower Extremity Assessment Lower Extremity Assessment: RLE deficits/detail;Difficult to assess due to impaired cognition RLE Deficits / Details: Observed pt with active movement in all groups against gravity, but he has difficulty with motor control and following structured multi level commands to specifically test LE RLE Sensation: decreased light touch;decreased proprioception RLE Coordination: decreased fine motor;decreased gross motor Cervical / Trunk Assessment Cervical / Trunk Assessment: Other exceptions Cervical / Trunk Exceptions: large anterior abdomen   Balance Balance Balance Assessed: Yes Static Sitting Balance Static Sitting - Balance Support: No upper extremity supported;Feet supported Static Sitting - Level of Assistance: 7: Independent Static Standing Balance Static Standing - Balance Support: No upper extremity supported Static Standing - Level of Assistance: 5: Stand by assistance Static Standing - Comment/# of Minutes: several minutes. Pt appears to have Right genu recurvatum in standing, but he has decreased sensation in his leg  End of Session PT - End of Session Equipment Utilized During Treatment: Gait belt Activity Tolerance: Patient tolerated treatment well Patient left: in bed;with bed alarm set Nurse Communication: Mobility status  GP    Bayard Hugger. Manson Passey, PT [phone] 09/09/2013, 10:45 AM

## 2013-09-09 NOTE — Progress Notes (Signed)
Patient ID: Martin Martin, male   DOB: 09-18-1956, 57 y.o.   MRN: [id number] [facility] PROGRESS NOTE  Martin Martin WUJ:[id number] DOB: Aug 12, 1956 DOA: 09/08/2013 PCP: [name], MD  Brief narrative: 47 -year-old male with past medical history of glioblastoma of the left parietal brain status post biopsy and debulking in 03/2013, on concurrent radiation and chemotherapy with temodar who presented to [facility] ED with complaints of right lower extremity weakness. MRI of the brain on this admission revealed left parietal mass lesion showing progression in the wall of the mass with central necrosis. Noted also was large amount of white matter edema with midline shift of findings consistent with tumor progression. Blood work was unremarkable on the admission.  Assessment/Plan:  Principal Problem; Right sided weakness in the setting of progressive glioblastoma of the left parietal brain  - appreciate neurosurgery consult and recomendations - now on decadron 4 mg every 6 hours IV - continue keppra 500 mg PO at bedtime  Active Problems: Hypertension - continue Cardizem and metoprolol  Code Status: full code Family Communication: no family at the bedside Disposition Plan: home when stable  Manson Passey, MD  [facility] Pager [phone]  If 7PM-7AM, please contact night-coverage [email or web address] Password [email or web address] 09/09/2013, 6:48 AM   LOS: 1 day   Consultants:  Neurosurgery   Procedures:  None   Antibiotics:  None   HPI/Subjective: Says he feels better this am.  Objective: Filed Vitals:   09/08/13 1438 09/08/13 2114 09/09/13 0538 09/09/13 0542  BP: 133/105 127/90 128/55 124/82  Pulse: 104 94 76 89  Temp: 97.6 F (36.4 C) 98.1 F (36.7 C) 98 F (36.7 C) 97.5 F (36.4 C)  TempSrc: Oral Oral Oral Oral  Resp: 18 18 18 18   Height: 6' (1.829 m)     Weight: 90.765 kg (200 lb 1.6 oz)     SpO2: 97% 94% 99% 96%    Intake/Output Summary (Last 24 hours) at 09/09/13  0648 Last data filed at 09/08/13 1900  Gross per 24 hour  Intake    480 ml  Output      0 ml  Net    480 ml    Exam:   General:  Pt is alert, follows commands appropriately, not in acute distress  Cardiovascular: Regular rate and rhythm, S1/S2, no murmurs, no rubs, no gallops  Respiratory: Clear to auscultation bilaterally, no wheezing, no crackles, no rhonchi  Abdomen: Soft, non tender, non distended, bowel sounds present, no guarding  Extremities: No edema, pulses DP and PT palpable bilaterally  Neuro: RLE weakness  Data Reviewed: Basic Metabolic Panel:  Recent Labs Lab 09/08/13 1148  NA 142  K 4.7  CO2 25  GLUCOSE 109  BUN 18.1  CREATININE 1.0  CALCIUM 9.4   Liver Function Tests:  Recent Labs Lab 09/08/13 1148  AST 22  ALT 61*  ALKPHOS 53  BILITOT 0.37  PROT 6.9  ALBUMIN 3.5   No results found for this basename: LIPASE, AMYLASE,  in the last 168 hours No results found for this basename: AMMONIA,  in the last 168 hours CBC:  Recent Labs Lab 09/08/13 1148  WBC 7.8  NEUTROABS 6.1  HGB 13.8  HCT 39.8  MCV 98.0  PLT 227   Cardiac Enzymes:  Recent Labs Lab 09/08/13 1557  CKTOTAL 63   BNP: No components found with this basename: POCBNP,  CBG: No results found for this basename: GLUCAP,  in the last 168 hours  Recent Results (from the past  240 hour(s))  TECHNOLOGIST REVIEW     Status: None   Collection Time    12-Sep-2013 11:48 AM      Result Value Range Status   Technologist Review Rare meta   Final     Studies: Mr Lodema Pilot Contrast  09-12-13   CLINICAL DATA:  Glioblastoma followup. Right-sided weakness  EXAM: MRI HEAD WITHOUT AND WITH CONTRAST  TECHNIQUE: Multiplanar, multiecho pulse sequences of the brain and surrounding structures were obtained according to standard protocol without and with intravenous contrast  CONTRAST:  19mL MULTIHANCE GADOBENATE DIMEGLUMINE 529 MG/ML IV SOLN  COMPARISON:  MRI 07/20/2013  FINDINGS: Left parietal  craniotomy for resection of mass lesion. There is marked progression of edema. There is now a large amount of white matter edema around the mass with local mass effect. 6 mm midline shift to the right has developed since the the prior study when there was no shift. Hemosiderin lines the surgical cavity. The surgical cavity shows progressive enhancement since prior study. There is thick irregular enhancement of the wall with central necrosis in the cavity. The mass is slightly larger now measuring 56 x 41 mm in transverse dimension compared with 53 x 40 mm previously. The mass measures 47 mm in height which is similar to the prior study. The main difference is increase in edema and progressive thickening and enhancement of the wall of the mass.  Negative for acute infarct. There is some restricted diffusion within the cavity due to hemorrhage and tumor. Ventricle size are normal.  IMPRESSION: Left parietal mass lesion shows progression of enhancing tissue in the wall of the mass with central necrosis. There is a large amount of white matter edema which has progressed and there is now 6 mm midline shift which has developed since the the prior study. Findings are most compatible with tumor progression. Radiation necrosis considered less likely.   Electronically Signed   By: Marlan Palau M.D.   On: Sep 12, 2013 17:33    Scheduled Meds: . dexamethasone  4 mg Intravenous Q6H  . diltiazem  120 mg Oral Daily  . levETIRAcetam  500 mg Oral QHS  . metoprolol tartrate  25 mg Oral BID  . pantoprazole  40 mg Oral Daily  . sulfamethoxazole-trimethoprim  1 tablet Oral Daily   Continuous Infusions:

## 2013-09-09 NOTE — Progress Notes (Signed)
Patient given xanax earlier and was able to sleep. Awoke while being given decadron and "can't go back to sleep". Paged NP on call and orders received for ativan 1 mg IV x 1 dose. Given and patient currently snoring and sleeping soundly.

## 2013-09-09 NOTE — Progress Notes (Signed)
Patient ID: Martin Martin, male   DOB: 1956-07-23, 57 y.o.   MRN: [id number] I was called at 1 AM to reviewed this patient's MRI of the brain. At that time I was in an emergency surgery, but I have reviewed the MRI. It shows progression of his disease. This is either progressive tumor growth or radiation necrosis. Dr. Joya Salm performed a craniotomy on him in April for GBM. He was admitted and put on Decadron which is appropriate. I will inform Dr. Joya Salm that the patient is back in the hospital. He can then discuss options for treatment and determined the patient's wishes. If the patient desires aggressive treatment he may be a candidate for repeat resection placement of Gliadel wafers. He is already on Temodar and has received radiation therapy.

## 2013-09-09 NOTE — Plan of Care (Signed)
Problem: Phase I Progression Outcomes Goal: OOB as tolerated unless otherwise ordered Outcome: Progressing Ambulated with PT today and in hallway with staff

## 2013-09-10 MED ORDER — FLUCONAZOLE 100 MG PO TABS
100.0000 mg | ORAL_TABLET | Freq: Every day | ORAL | Status: DC
  Administered 2013-09-10 – 2013-09-11 (×2): 100 mg via ORAL
  Filled 2013-09-10 (×2): qty 1

## 2013-09-10 NOTE — Progress Notes (Signed)
[facility] PROGRESS NOTE  Martin Martin WJX:[id number] DOB: 30-Jul-1956 DOA: 09/08/2013 PCP: [name], MD  Brief narrative: 57 -year-old male with past medical history of glioblastoma of the left parietal brain status post biopsy and debulking in 03/2013, on concurrent radiation and chemotherapy with temodar who presented to [facility] ED with complaints of right lower extremity weakness. MRI of the brain on this admission revealed left parietal mass lesion showing progression in the wall of the mass with central necrosis. Noted also was large amount of white matter edema with midline shift of findings consistent with tumor progression. Blood work was unremarkable on the admission.   Assessment/Plan:   Principal Problem;  Right sided weakness in the setting of progressive glioblastoma of the left parietal brain  - appreciate neurosurgery consult and recomendations - spoke with Dr. Yetta Barre this am, Dr. Jeral Fruit to see pt on Monday and advise on surgery - continue decadron 4 mg every 6 hours IV  - continue keppra 500 mg PO at bedtime  Active Problems:  Hypertension  - continue Cardizem and metoprolol   Code Status: full code  Family Communication: no family at the bedside  Disposition Plan: home when stable   Manson Passey, MD  [facility]  Pager [phone]   Consultants:  Neurosurgery - phone call Procedures:  None  Antibiotics:  None    If 7PM-7AM, please contact night-coverage [email or web address] Password [email or web address] 09/10/2013, 2:29 PM   LOS: 2 days    HPI/Subjective: No overnight events.  Objective: Filed Vitals:   09/09/13 2130 09/09/13 2137 09/10/13 0451 09/10/13 1358  BP:  127/81 122/82 131/47  Pulse: 102 97 75 69  Temp:  97.4 F (36.3 C) 97.7 F (36.5 C) 98.9 F (37.2 C)  TempSrc:  Oral Oral Oral  Resp:  18 18 19   Height:      Weight:      SpO2:  95% 100% 100%    Intake/Output Summary (Last 24 hours) at 09/10/13 1429 Last data filed at 09/10/13 1404  Gross  per 24 hour  Intake   1250 ml  Output      0 ml  Net   1250 ml    Exam:   General:  Pt is alert, follows commands appropriately, not in acute distress  Cardiovascular: Regular rate and rhythm, S1/S2, no murmurs, no rubs, no gallops  Respiratory: Clear to auscultation bilaterally, no wheezing, no crackles, no rhonchi  Abdomen: Soft, non tender, non distended, bowel sounds present, no guarding  Extremities: No edema, pulses DP and PT palpable bilaterally  Neuro: Grossly nonfocal; RUE weakness but good motor strength in B/L LE  Data Reviewed: Basic Metabolic Panel:  Recent Labs Lab 09/08/13 1148  NA 142  K 4.7  CO2 25  GLUCOSE 109  BUN 18.1  CREATININE 1.0  CALCIUM 9.4   Liver Function Tests:  Recent Labs Lab 09/08/13 1148  AST 22  ALT 61*  ALKPHOS 53  BILITOT 0.37  PROT 6.9  ALBUMIN 3.5   No results found for this basename: LIPASE, AMYLASE,  in the last 168 hours No results found for this basename: AMMONIA,  in the last 168 hours CBC:  Recent Labs Lab 09/08/13 1148  WBC 7.8  NEUTROABS 6.1  HGB 13.8  HCT 39.8  MCV 98.0  PLT 227   Cardiac Enzymes:  Recent Labs Lab 09/08/13 1557  CKTOTAL 63   BNP: No components found with this basename: POCBNP,  CBG: No results found for this basename: GLUCAP,  in the  last 168 hours  Recent Results (from the past 240 hour(s))  TECHNOLOGIST REVIEW     Status: None   Collection Time    09/08/13 11:48 AM      Result Value Range Status   Technologist Review Rare meta   Final     Studies: Mr Laqueta Jean Wo Contrast 09/08/2013    IMPRESSION: Left parietal mass lesion shows progression of enhancing tissue in the wall of the mass with central necrosis. There is a large amount of white matter edema which has progressed and there is now 6 mm midline shift which has developed since the the prior study. Findings are most compatible with tumor progression. Radiation necrosis considered less likely.   Electronically Signed    By: Marlan Palau M.D.   On: 09/08/2013 17:33    Scheduled Meds: . dexamethasone  4 mg Intravenous Q6H  . diltiazem  120 mg Oral Daily  . fluconazole  100 mg Oral Daily  . levETIRAcetam  500 mg Oral QHS  . metoprolol tartrate  25 mg Oral BID  . pantoprazole  40 mg Oral Daily  . sulfamethoxazole-trimethoprim  1 tablet Oral Daily

## 2013-09-11 ENCOUNTER — Ambulatory Visit
Admit: 2013-09-11 | Discharge: 2013-09-11 | Disposition: A | Discharge status: Home / Self Care | Payer: BC Managed Care – PPO | Attending: Radiation Oncology | Admitting: Radiation Oncology

## 2013-09-11 ENCOUNTER — Encounter: Payer: Self-pay | Admitting: Radiation Oncology

## 2013-09-11 DIAGNOSIS — C713 Malignant neoplasm of parietal lobe: Secondary | ICD-10-CM

## 2013-09-11 MED ORDER — DEXAMETHASONE 4 MG PO TABS: 4.0000 mg | ORAL_TABLET | Freq: Four times a day (QID) | ORAL | Status: DC

## 2013-09-11 NOTE — Progress Notes (Signed)
Physical Therapy Treatment Patient Details Name: Martin Martin MRN: [id number] DOB: [birth year] Today's Date: 09/11/2013 Time: 1330-1340 PT Time Calculation (min): 10 min  PT Assessment / Plan / Recommendation  History of Present Illness pt feels that his leg is improved   PT Comments   Pt decided to pick up a straight cane on his own and not worry about insurance coverage for it.  He and sister acknowledged the need to keep a rubber tip on it for safety  Follow Up Recommendations        Does the patient have the potential to tolerate intense rehabilitation     Barriers to Discharge        Equipment Recommendations       Recommendations for Other Services    Frequency     Progress towards PT Goals    Plan Current plan remains appropriate    Precautions / Restrictions     Pertinent Vitals/Pain No c/o pain    Mobility  Bed Mobility Bed Mobility: Rolling Right;Rolling Left;Supine to Sit Rolling Right: 7: Independent Rolling Left: 7: Independent Supine to Sit: 7: Independent Details for Bed Mobility Assistance: Pt improved in functional mobility today Transfers Transfers: Sit to Stand;Stand to Sit Sit to Stand: 7: Independent Stand to Sit: 7: Independent Ambulation/Gait Ambulation/Gait Assistance: 6: Modified independent (Device/Increase time);7: Independent Ambulation Distance (Feet): 300 Feet Assistive device: Straight cane;None Ambulation/Gait Assistance Details: none Gait Pattern: Step-through pattern General Gait Details: pt appears more comfortable using straight cane in Right UE as he has decreased arm swing when walking without it.  However, he able to walk with no assistive device without overt balance loss Stairs: No Wheelchair Mobility Wheelchair Mobility: No    Exercises     PT Diagnosis:    PT Problem List:   PT Treatment Interventions:     PT Goals (current goals can now be found in the care plan section)    Visit Information  Last PT  Received On: 09/11/13 History of Present Illness: pt feels that his leg is improved    Subjective Data      Cognition  Cognition Arousal/Alertness: Awake/alert Behavior During Therapy: WFL for tasks assessed/performed Overall Cognitive Status: Within Functional Limits for tasks assessed    Balance  Static Sitting Balance Static Sitting - Balance Support: No upper extremity supported;Feet supported Static Sitting - Level of Assistance: 7: Independent Static Standing Balance Static Standing - Balance Support: No upper extremity supported;During functional activity Static Standing - Level of Assistance: 7: Independent  End of Session PT - End of Session Activity Tolerance: Patient tolerated treatment well Patient left: in bed;with family/visitor present Nurse Communication: Mobility status   GP    Rosey Bath K. Iantha, [place] [phone] 09/11/2013, 2:11 PM

## 2013-09-11 NOTE — Progress Notes (Signed)
  Radiation Oncology         [phone]) [phone] ________________________________  Name: Martin Martin MRN: [id number]  Date: 09/11/2013  DOB: December 21, 1956  Chart Note:  I reviewed this patient's most recent findings and wanted to take a minute to document my impression.  He appears to have recurrent GBM following radiation and temodar.  He has significant edema and midline shift.  If re-resection is a feasible option with acceptable risk, that would be preferred.  ________________________________  Sheral Apley. Tammi Klippel, M.D.

## 2013-09-11 NOTE — Progress Notes (Signed)
Radiation Oncology         [phone] ________________________________  Name: Martin Martin MRN: [id number]  Date: 09/11/2013  DOB: 10/31/1956  Follow-Up Visit Note  CC: [name], MD  Chipper Herb, MD  Diagnosis:   57 year old gentleman with glioblastoma of the left parietal brain status post biopsy/debulking treated to 60 Gy with Temodar  Interval Since Last Radiation:  3  months  Narrative:  The patient was admitted with worsening right leg weakness.  MRI shows recurrent GBM with significant vasogenic edema.  The patient has seen Dr. Joya Salm but refuses surgical resection at this time.                              ALLERGIES:  is allergic to dilaudid and morphine and related.  Meds: Current Outpatient Prescriptions  Medication Sig Dispense Refill  . ALPRAZolam (XANAX) 0.25 MG tablet Take 1 tablet (0.25 mg total) by mouth at bedtime as needed for sleep.  30 tablet  0  . calcium carbonate (TUMS - DOSED IN MG ELEMENTAL CALCIUM) 500 MG chewable tablet Chew 1 tablet by mouth daily. For indigestion      . dexamethasone (DECADRON) 4 MG tablet Take 1 tablet (4 mg total) by mouth every 6 (six) hours. Take 1 pill twice daily for 2 weeks then 1/2 pill twice daily until further instructions  120 tablet  1  . diltiazem (CARDIZEM CD) 120 MG 24 hr capsule Take 1 capsule (120 mg total) by mouth daily.  90 capsule  3  . fluconazole (DIFLUCAN) 100 MG tablet Take 100 mg by mouth daily.      Marland Kitchen levETIRAcetam (KEPPRA) 500 MG tablet Take 500 mg by mouth at bedtime. Changed by Dr. Joya Salm 04/11/13      . metoprolol tartrate (LOPRESSOR) 25 MG tablet Take 25 mg by mouth 2 (two) times daily.       . ondansetron (ZOFRAN) 8 MG tablet Take 1/2 hour prior to temodar daily  30 tablet  3  . pantoprazole (PROTONIX) 40 MG tablet take 1 tablet by mouth once daily  30 tablet  6  . sulfamethoxazole-trimethoprim (BACTRIM DS,SEPTRA DS) 800-160 MG per tablet Take 1 pill daily  30 tablet  5  . temozolomide  (TEMODAR) 250 MG capsule Take 1 capsule (250 mg total) by mouth daily. May take on an empty stomach or at bedtime to decrease nausea & vomiting.  5 capsule  4   No current facility-administered medications for this encounter.   Facility-Administered Medications Ordered in Other Encounters  Medication Dose Route Frequency Provider Last Rate Last Dose  . ALPRAZolam Duanne Moron) tablet 0.25 mg  0.25 mg Oral QHS PRN Hosie Poisson, MD   0.25 mg at 09/10/13 2139  . dexamethasone (DECADRON) injection 4 mg  4 mg Intravenous Q6H Hosie Poisson, MD   4 mg at 09/11/13 1159  . diltiazem (CARDIZEM CD) 24 hr capsule 120 mg  120 mg Oral Daily Hosie Poisson, MD   120 mg at 09/11/13 0955  . fluconazole (DIFLUCAN) tablet 100 mg  100 mg Oral Daily Robbie Lis, MD   100 mg at 09/11/13 0955  . levETIRAcetam (KEPPRA) tablet 500 mg  500 mg Oral QHS Hosie Poisson, MD   500 mg at 09/10/13 2139  . LORazepam (ATIVAN) injection 1 mg  1 mg Intravenous QHS PRN Ritta Slot, NP      . metoprolol tartrate (LOPRESSOR) tablet 25 mg  25  mg Oral BID Hosie Poisson, MD   25 mg at 09/11/13 0955  . ondansetron (ZOFRAN) tablet 4 mg  4 mg Oral Q6H PRN Hosie Poisson, MD   4 mg at 09/09/13 2003   Or  . ondansetron (ZOFRAN) injection 4 mg  4 mg Intravenous Q6H PRN Hosie Poisson, MD      . pantoprazole (PROTONIX) EC tablet 40 mg  40 mg Oral Daily Hosie Poisson, MD   40 mg at 09/10/13 2139  . sulfamethoxazole-trimethoprim (BACTRIM DS) 800-160 MG per tablet 1 tablet  1 tablet Oral Daily Hosie Poisson, MD   1 tablet at 09/11/13 6578    Physical Findings: The patient is in no acute distress. Patient is alert and oriented. Cushingoid features.  No significant changes.  Lab Findings: Lab Results  Component Value Date   WBC 7.8 09/08/2013   HGB 13.8 09/08/2013   HCT 39.8 09/08/2013   MCV 98.0 09/08/2013   PLT 227 09/08/2013    Radiographic Findings: Mr Jeri Cos IO Contrast  09/08/2013   CLINICAL DATA:  Glioblastoma followup. Right-sided weakness   EXAM: MRI HEAD WITHOUT AND WITH CONTRAST  TECHNIQUE: Multiplanar, multiecho pulse sequences of the brain and surrounding structures were obtained according to standard protocol without and with intravenous contrast  CONTRAST:  40mL MULTIHANCE GADOBENATE DIMEGLUMINE 529 MG/ML IV SOLN  COMPARISON:  MRI 07/20/2013  FINDINGS: Left parietal craniotomy for resection of mass lesion. There is marked progression of edema. There is now a large amount of white matter edema around the mass with local mass effect. 6 mm midline shift to the right has developed since the the prior study when there was no shift. Hemosiderin lines the surgical cavity. The surgical cavity shows progressive enhancement since prior study. There is thick irregular enhancement of the wall with central necrosis in the cavity. The mass is slightly larger now measuring 56 x 41 mm in transverse dimension compared with 53 x 40 mm previously. The mass measures 47 mm in height which is similar to the prior study. The main difference is increase in edema and progressive thickening and enhancement of the wall of the mass.  Negative for acute infarct. There is some restricted diffusion within the cavity due to hemorrhage and tumor. Ventricle size are normal.  IMPRESSION: Left parietal mass lesion shows progression of enhancing tissue in the wall of the mass with central necrosis. There is a large amount of white matter edema which has progressed and there is now 6 mm midline shift which has developed since the the prior study. Findings are most compatible with tumor progression. Radiation necrosis considered less likely.   Electronically Signed   By: Franchot Gallo M.D.   On: 09/08/2013 17:33    Impression:  The patient has apparent progression/recurrence 3 months after radiation.  Plan:  I will discuss case with our research group to see if he is eligible for RTOG 1205, and discuss with Dr. Humphrey Rolls.  If he is ineligible, he may be a candidate for  Avastin.  _____________________________________  Sheral Apley. Tammi Klippel, M.D.

## 2013-09-11 NOTE — Discharge Summary (Signed)
Physician Discharge Summary  Martin Martin VQQ:[id number] DOB: December 09, 1956 DOA: 09/08/2013  PCP: [name], MD  Admit date: 09/08/2013 Discharge date: 09/11/2013  Recommendations for Outpatient Follow-up:  1. You will continue taking decadron 4 mg every 6 hours. Follow up with radiation oncology in regards to tapering steroids.  2. Plan for Temodar to be changed to Avastin per oncology. Please follow up with oncology.  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 MRI of the brain on this admission revealed left parietal mass lesion showing progression in the wall of the mass with central necrosis. Noted also was large amount of white matter edema with midline shift of findings consistent with tumor progression. Blood work was unremarkable on the admission.   Assessment/Plan:   Principal Problem;  Right sided weakness in the setting of progressive glioblastoma of the left parietal brain  - no plans for surgery but likely radiation; pt will follow up with radiation oncology; plan to change chemotherapy to Avastin - continue decadron 4 mg every 6 hours PO per radiation oncology recomendations - continue keppra 500 mg PO at bedtime  Active Problems:  Hypertension  - continue Cardizem and metoprolol    Code Status: full code  Family Communication: no family at the bedside   Manson Passey, MD  [facility]  Pager [phone]    Consultants:   Neurosurgery - phone call Procedures:  None  Antibiotics:  None      Signed:  Manson Passey, MD  [facility] 09/11/2013, 12:16 PM  Pager #: [phone]   Discharge Exam: Filed Vitals:   09/11/13 0515  BP: 123/76  Pulse: 92  Temp: 98.2 F (36.8 C)  Resp: 16   Filed Vitals:   09/10/13 0451 09/10/13 1358 09/10/13 2128 09/11/13 0515  BP: 122/82 131/47 142/92 123/76  Pulse: 75 69 88 92  Temp: 97.7 F (36.5 C) 98.9 F (37.2 C) 98.1 F (36.7 C) 98.2 F (36.8 C)  TempSrc: Oral Oral Oral Oral  Resp: 18 19 18 16   Height:      Weight:      SpO2: 100% 100% 97% 97%    General: Pt is alert, follows commands appropriately, not in acute distress Cardiovascular: Regular rate and rhythm, S1/S2 +, no murmurs, no rubs, no gallops Respiratory: Clear to auscultation bilaterally, no wheezing, no crackles, no rhonchi Abdominal: Soft, non tender, non distended, bowel sounds +, no guarding Extremities: no edema, no cyanosis, pulses palpable bilaterally DP and PT Neuro: Grossly nonfocal  Discharge Instructions  Discharge Orders   Future Appointments Provider Department Dept Phone   09/25/2013 2:30 PM Krista Blue [facility] [phone]   09/25/2013 3:00 PM Victorino December, MD [facility] MEDICAL ONCOLOGY [phone]   10/16/2013 9:00 AM [facility] [place] [facility] AT [address] [phone]   Patient to arrive 15 minutes prior to appointment time.   10/18/2013 3:45 PM Oneita Hurt, MD [facility] RADIATION ONCOLOGY [phone]  Future Orders Complete By Expires   Call MD for:  difficulty breathing, headache or visual disturbances  As directed    Call MD for:  persistant dizziness or light-headedness  As directed    Call MD for:  persistant nausea and vomiting  As directed    Call MD for:  severe uncontrolled pain  As directed    Diet - low sodium heart healthy  As directed    Discharge  instructions  As directed    Comments:     1. You will continue taking decadron 4 mg every 6 hours. Follow up with radiation oncology in regards to tapering steroids. 2. Plan for Temodar to be changed to Avastin per oncology. Please follow up with oncology.   Increase activity slowly  As directed        Medication List         ALPRAZolam 0.25 MG tablet  Commonly known as:  XANAX  Take 1 tablet (0.25 mg total) by mouth at bedtime as needed for sleep.     calcium carbonate 500 MG chewable tablet  Commonly known as:  TUMS - dosed in mg elemental calcium  Chew 1 tablet by mouth daily. For indigestion     dexamethasone 4 MG tablet  Commonly known as:  DECADRON  - Take 1 tablet (4 mg total) by mouth every 6 (six) hours. Take 1 pill twice daily for 2 weeks then  - 1/2 pill twice daily until further instructions     diltiazem 120 MG 24 hr capsule  Commonly known as:  CARDIZEM CD  Take 1 capsule (120 mg total) by mouth daily.     fluconazole 100 MG tablet  Commonly known as:  DIFLUCAN  Take 100 mg by mouth daily.     levETIRAcetam 500 MG tablet  Commonly known as:  KEPPRA  Take 500 mg by mouth at bedtime. Changed by Dr. Jeral Fruit 04/11/13     metoprolol tartrate 25 MG tablet  Commonly known as:  LOPRESSOR  Take 25 mg by mouth 2 (two) times daily.     ondansetron 8 MG tablet  Commonly known as:  ZOFRAN  Take 1/2 hour prior to temodar daily     pantoprazole 40 MG tablet  Commonly known as:  PROTONIX  take 1 tablet by mouth once daily     sulfamethoxazole-trimethoprim 800-160 MG per tablet  Commonly known as:  BACTRIM DS,SEPTRA DS  Take 1 pill daily     temozolomide 250 MG capsule  Commonly known as:  TEMODAR  Take 1 capsule (250 mg total) by mouth daily. May take on an empty stomach or at bedtime to decrease nausea & vomiting.           Follow-up Information   Follow up with [name], MD In 1 week.   Specialty:  Family Medicine   Contact information:   [address] [place] [address] [phone]        The results of significant diagnostics from this hospitalization (including imaging, microbiology, ancillary and laboratory) are listed below for reference.    Significant Diagnostic Studies: Mr Lodema Pilot Contrast  09/09/13   CLINICAL DATA:  Glioblastoma followup. Right-sided weakness  EXAM: MRI HEAD WITHOUT AND WITH CONTRAST  TECHNIQUE: Multiplanar, multiecho pulse sequences of the brain and surrounding structures were obtained according to standard protocol without and with intravenous contrast  CONTRAST:  19mL MULTIHANCE GADOBENATE DIMEGLUMINE 529 MG/ML IV SOLN  COMPARISON:  MRI 07/20/2013  FINDINGS: Left  parietal craniotomy for resection of mass lesion. There is marked progression of edema. There is now a large amount of white matter edema around the mass with local mass effect. 6 mm midline shift to the right has developed since the the prior study when there was no shift. Hemosiderin lines the surgical cavity. The surgical cavity shows progressive enhancement since prior study. There is thick irregular enhancement of the wall with central necrosis in the cavity. The mass is slightly larger now measuring 56 x 41 mm in transverse dimension compared with 53 x 40 mm previously. The mass measures 47 mm in height which is similar to the prior study. The main difference is increase in edema and progressive thickening and enhancement of the wall of the mass.  Negative for acute infarct. There is some restricted diffusion within the cavity due to hemorrhage and tumor. Ventricle size are normal.  IMPRESSION: Left parietal mass lesion shows progression of enhancing tissue in the wall of the mass with central necrosis. There is a large amount of white matter edema which has progressed and there is now 6 mm midline shift which has developed since the the prior study. Findings are most compatible with tumor progression. Radiation necrosis considered less  likely.   Electronically Signed   By: Marlan Palau M.D.   On: 09/08/2013 17:33    Microbiology: Recent Results (from the past 240 hour(s))  TECHNOLOGIST REVIEW     Status: None   Collection Time    09/08/13 11:48 AM      Result Value Range Status   Technologist Review Rare meta   Final     Labs: Basic Metabolic Panel:  Recent Labs Lab 09/08/13 1148  NA 142  K 4.7  CO2 25  GLUCOSE 109  BUN 18.1  CREATININE 1.0  CALCIUM 9.4   Liver Function Tests:  Recent Labs Lab 09/08/13 1148  AST 22  ALT 61*  ALKPHOS 53  BILITOT 0.37  PROT 6.9  ALBUMIN 3.5   No results found for this basename: LIPASE, AMYLASE,  in the last 168 hours No results found for this basename: AMMONIA,  in the last 168 hours CBC:  Recent Labs Lab 09/08/13 1148  WBC 7.8  NEUTROABS 6.1  HGB 13.8  HCT 39.8  MCV 98.0  PLT 227   Cardiac Enzymes:  Recent Labs Lab 09/08/13 1557  CKTOTAL 63   BNP: BNP (last 3 results) No results found for this basename: PROBNP,  in the last 8760 hours CBG: No results found for this basename: GLUCAP,  in the last 168 hours  Time coordinating discharge: Over 30 minutes

## 2013-09-11 NOTE — Progress Notes (Signed)
Pt. Was discharged home. He was given his discharge instructions, prescriptions, and all questions were answered.  Pt. Was transported home by his sister.

## 2013-09-11 NOTE — Care Management Note (Addendum)
Cm spoke with patient concerning dc planning. No PT follow up recommended by PT. Pt recommends cane.  Pt to self provide DME.PCP: [name], MD. Pt uses [facility] pharmacy in [facility]. Per pt resides home alone. Adult sisters assist with home care and to provide tx home. No other needs identified.    Roxy Manns Amiylah Anastos,RN,MSN [phone]

## 2013-09-11 NOTE — Consult Note (Signed)
Martin Martin   DOB:Oct 03, 1956   GY#:[id number]   OJJ#:[id number]  Subjective: Patient is a 57 year old with GBM.  He was admitted after being seen in clinic on Friday for progressive right sided weakness and confusion.  He underwent STAT MRI of the brain that was consistent with progression.  He was also started on IV steroids and his strength has improved since Friday.  He is eating, drinking, and working well with physical therapy.  Otherwise, a 10 point ROS is negative.    Objective:  Filed Vitals:   09/11/13 0515  BP: 123/76  Pulse: 92  Temp: 98.2 F (36.8 C)  Resp: 16    Body mass index is 27.13 kg/(m^2). No intake or output data in the 24 hours ending 09/11/13 2228   Sclerae unicteric  Oropharynx clear  No peripheral adenopathy  Lungs clear -- no rales or rhonchi  Heart regular rate and rhythm  Abdomen benign  MSK no focal spinal tenderness, no peripheral edema  Neuro nonfocal, 4/5 strength in right leg and arm, 5/5 in left leg and arm.      CBG (last 3)  No results found for this basename: GLUCAP,  in the last 72 hours   Labs:  Lab Results  Component Value Date   WBC 7.8 09/08/2013   HGB 13.8 09/08/2013   HCT 39.8 09/08/2013   MCV 98.0 09/08/2013   PLT 227 09/08/2013   NEUTROABS 6.1 09/08/2013    Urine Studies No results found for this basename: UACOL, UAPR, USPG, UPH, UTP, UGL, UKET, UBIL, UHGB, UNIT, UROB, ULEU, UEPI, UWBC, URBC, UBAC, CAST, CRYS, UCOM, BILUA,  in the last 72 hours  Basic Metabolic Panel:  Recent Labs Lab 09/08/13 1148  NA 142  K 4.7  CO2 25  GLUCOSE 109  BUN 18.1  CREATININE 1.0  CALCIUM 9.4   GFR Estimated Creatinine Clearance: 89.5 ml/min (by C-G formula based on Cr of 1). Liver Function Tests:  Recent Labs Lab 09/08/13 1148  AST 22  ALT 61*  ALKPHOS 53  BILITOT 0.37  PROT 6.9  ALBUMIN 3.5   No results found for this basename: LIPASE, AMYLASE,  in the last 168 hours No results found for this basename: AMMONIA,  in the last  168 hours Coagulation profile No results found for this basename: INR, PROTIME,  in the last 168 hours  CBC:  Recent Labs Lab 09/08/13 1148  WBC 7.8  NEUTROABS 6.1  HGB 13.8  HCT 39.8  MCV 98.0  PLT 227   Cardiac Enzymes:  Recent Labs Lab 09/08/13 1557  CKTOTAL 63   BNP: No components found with this basename: POCBNP,  CBG: No results found for this basename: GLUCAP,  in the last 168 hours D-Dimer No results found for this basename: DDIMER,  in the last 72 hours Hgb A1c No results found for this basename: HGBA1C,  in the last 72 hours Lipid Profile No results found for this basename: CHOL, HDL, LDLCALC, TRIG, CHOLHDL, LDLDIRECT,  in the last 72 hours Thyroid function studies No results found for this basename: TSH, T4TOTAL, FREET3, T3FREE, THYROIDAB,  in the last 72 hours Anemia work up No results found for this basename: VITAMINB12, FOLATE, FERRITIN, TIBC, IRON, RETICCTPCT,  in the last 72 hours Microbiology Recent Results (from the past 240 hour(s))  TECHNOLOGIST REVIEW     Status: None   Collection Time    09/08/13 11:48 AM      Result Value Range Status   Technologist Review  Rare meta   Final      Studies:  No results found.  Assessment/plan: 57 y.o. with   1. GBM:  Patient's GBM has progressed.  His weakness has improved with Dexamethasone.  We will see him after discharge and consultation with Dr. Tammi Klippel.  He should continue Dexamethasone as outpatient.    2. Strength/conditioning: Patient will continue with physical therapy.    3. Other medical problems, as per the hospitalist team.  Thank you for the great care of our patient.    Suzan Garibaldi Sheppard Coil, [place] Phone: [phone]     Oneida Alar 09/11/2013

## 2013-09-11 NOTE — Consult Note (Signed)
Patient seen this am. Long talk with him. Admitted because of weakness of right leg. Mri shows increase of tumor with edema. Today while being on decadron he feels better and stronger. i saw the mri. He wants to talk with radiation oncology about what to do next. He does not want further surgery unless he is advised by oncology. He really wants to continue the treatment and have a mri brain repeated in 4 to 6 weeks and then he can agree with surgery if that is the case

## 2013-09-11 NOTE — Telephone Encounter (Signed)
Patient seen.

## 2013-09-12 ENCOUNTER — Telehealth: Payer: Self-pay | Admitting: Radiation Oncology

## 2013-09-12 ENCOUNTER — Other Ambulatory Visit: Payer: Self-pay | Admitting: Radiation Oncology

## 2013-09-12 ENCOUNTER — Telehealth: Payer: Self-pay | Admitting: *Deleted

## 2013-09-12 ENCOUNTER — Other Ambulatory Visit: Payer: Self-pay | Admitting: Medical Oncology

## 2013-09-12 DIAGNOSIS — C713 Malignant neoplasm of parietal lobe: Secondary | ICD-10-CM

## 2013-09-12 NOTE — Telephone Encounter (Signed)
CALLED PATIENT TO INFORM OF TEST, LVM FOR A RETURN CALL 

## 2013-09-12 NOTE — Telephone Encounter (Signed)
Lm gv appt for 09/13/13 @ 1:45pm...td

## 2013-09-12 NOTE — Telephone Encounter (Signed)
Phoned patient as directed by Dr. Kathrynn Running to make him aware of brain PET needed for clinical trial. No answer. Phoned sister, Martin Martin. She reports that she just hung up with Martin Martin and he had asked her to phone this Clinical research associate. Explained to Martin Martin that a brain PET would be ordered for Martin Martin and is necessary for eligibility into the clinical trial. Also, stressed importance of 1345 appointment tomorrow with Dr. Welton Flakes. All questions answer. She verbalized understanding and expressed she and Martin Martin would be present at tomorrows appointment and look for a call about the scan.

## 2013-09-13 ENCOUNTER — Telehealth: Payer: Self-pay | Admitting: *Deleted

## 2013-09-13 ENCOUNTER — Encounter: Payer: Self-pay | Admitting: *Deleted

## 2013-09-13 ENCOUNTER — Ambulatory Visit (HOSPITAL_BASED_OUTPATIENT_CLINIC_OR_DEPARTMENT_OTHER): Payer: BC Managed Care – PPO | Admitting: Adult Health

## 2013-09-13 ENCOUNTER — Encounter: Payer: Self-pay | Admitting: Adult Health

## 2013-09-13 VITALS — BP 164/98 | HR 83 | Temp 97.7°F | Resp 20 | Ht 72.0 in | Wt 200.3 lb

## 2013-09-13 DIAGNOSIS — I10 Essential (primary) hypertension: Secondary | ICD-10-CM

## 2013-09-13 DIAGNOSIS — C713 Malignant neoplasm of parietal lobe: Secondary | ICD-10-CM

## 2013-09-13 NOTE — Patient Instructions (Signed)
Bevacizumab injection What is this medicine? BEVACIZUMAB (be va SIZ yoo mab) is a chemotherapy drug. It targets a protein found in many cancer cell types, and halts cancer growth. This drug treats many cancers including non-small cell lung cancer, and colon or rectal cancer. It is usually given with other chemotherapy drugs. This medicine may be used for other purposes; ask your health care provider or pharmacist if you have questions. What should I tell my health care provider before I take this medicine? They need to know if you have any of these conditions: -blood clots -heart disease, including heart failure, heart attack, or chest pain (angina) -high blood pressure -infection (especially a virus infection such as chickenpox, cold sores, or herpes) -kidney disease -lung disease -prior chemotherapy with doxorubicin, daunorubicin, epirubicin, or other anthracycline type chemotherapy agents -recent or ongoing radiation therapy -recent surgery -stroke -an unusual or allergic reaction to bevacizumab, hamster proteins, mouse proteins, other medicines, foods, dyes, or preservatives -pregnant or trying to get pregnant -breast-feeding How should I use this medicine? This medicine is for infusion into a vein. It is given by a health care professional in a hospital or clinic setting. Talk to your pediatrician regarding the use of this medicine in children. Special care may be needed. Overdosage: If you think you have taken too much of this medicine contact a poison control center or emergency room at once. NOTE: This medicine is only for you. Do not share this medicine with others. What if I miss a dose? It is important not to miss your dose. Call your doctor or health care professional if you are unable to keep an appointment. What may interact with this medicine? Interactions are not expected. This list may not describe all possible interactions. Give your health care provider a list of all  the medicines, herbs, non-prescription drugs, or dietary supplements you use. Also tell them if you smoke, drink alcohol, or use illegal drugs. Some items may interact with your medicine. What should I watch for while using this medicine? Your condition will be monitored carefully while you are receiving this medicine. You will need important blood work and urine testing done while you are taking this medicine. During your treatment, let your health care professional know if you have any unusual symptoms, such as difficulty breathing. This medicine may rarely cause 'gastrointestinal perforation' (holes in the stomach, intestines or colon), a serious side effect requiring surgery to repair. This medicine should be started at least 28 days following major surgery and the site of the surgery should be totally healed. Check with your doctor before scheduling dental work or surgery while you are receiving this treatment. Talk to your doctor if you have recently had surgery or if you have a wound that has not healed. Do not become pregnant while taking this medicine. Women should inform their doctor if they wish to become pregnant or think they might be pregnant. There is a potential for serious side effects to an unborn child. Talk to your health care professional or pharmacist for more information. Do not breast-feed an infant while taking this medicine. This medicine has caused ovarian failure in some women. This medicine may interfere with the ability to have a child. You should talk to your doctor or health care professional if you are concerned about your fertility. What side effects may I notice from receiving this medicine? Side effects that you should report to your doctor or health care professional as soon as possible: -allergic reactions like skin   rash, itching or hives, swelling of the face, lips, or tongue -signs of infection - fever or chills, cough, sore throat, pain or trouble passing  urine -signs of decreased platelets or bleeding - bruising, pinpoint red spots on the skin, black, tarry stools, nosebleeds, blood in the urine -breathing problems -changes in vision -chest pain -confusion -jaw pain, especially after dental work -mouth sores -seizures -severe abdominal pain -severe headache -sudden numbness or weakness of the face, arm or leg -swelling of legs or ankles -symptoms of a stroke: change in mental awareness, inability to talk or move one side of the body (especially in patients with lung cancer) -trouble passing urine or change in the amount of urine -trouble speaking or understanding -trouble walking, dizziness, loss of balance or coordination Side effects that usually do not require medical attention (report to your doctor or health care professional if they continue or are bothersome): -constipation -diarrhea -dry skin -headache -loss of appetite -nausea, vomiting This list may not describe all possible side effects. Call your doctor for medical advice about side effects. You may report side effects to FDA at 1-800-FDA-1088. Where should I keep my medicine? This drug is given in a hospital or clinic and will not be stored at home. NOTE: This sheet is a summary. It may not cover all possible information. If you have questions about this medicine, talk to your doctor, pharmacist, or health care provider.  2013, [employer]. (11/07/2010 4:25:37 PM)  

## 2013-09-13 NOTE — Progress Notes (Addendum)
OFFICE PROGRESS NOTE  CC  [name], MD [place] [address] Dr Veatrice Kells Dr. Tyler Pita  DIAGNOSIS: 57 year old gentleman with glioblastoma of the left parietal brain status post biopsy/debulking in April 2014  PRIOR THERAPY:  #1presented on 03/24/2013 with increasing severe disorientation and confusion. Because of this he was seen in the urgent care. He was noted to have some ataxia as well as confusion and speech difficulty with visual disturbances as well. He had a CT of the head without contrast that revealed 6.4 cm area of low attenuation with surrounding vasogenic edema in the left parietal lobe. An MRI performed showed a large partially necrotic 36 x 47 x 39 mm lesion with surrounding vasogenic edema. CT of the chest and abdomen showed no primary tumor. Patient went on to have left parietal brain biopsy and resection that showed WHO grade 4 glioblastoma. Postop brain MRI showed subtotal decompression with some mild postoperative hemorrhage centrally with vasogenic edema had resolved. He has been seen by Dr. Tyler Pita for radiation treatment and he is now seeing me for discussion regarding concurrent and adjuvant chemotherapy for glioblastoma  #2 post surgery patient was begun on concurrent radiation and adjuvant chemotherapy consisting of Temodar at 75 mg per meter squared. He completed radiation on 06/07/2013 overall he tolerated it well. He tolerated the Temodar very nicely as well. He is maintained on Bactrim DS.  #3 patient will initiate adjuvant Temodar at 150 mg per meter squared given on a 28 day cycle on days one through 5 of every cycle. His total doses 250 mg daily x5 days. We will initiate cycle 1 as soon as he gets his prescription filled.  CURRENT THERAPY:  INTERVAL HISTORY: Martin Martin 57 y.o. male returns for followup visit after hospital visit due to progression of GBM and right sided weakness.  He was started on IV steroids  and his weakness improved dramatically.  We are now determining whether or not we can get him on trial with RTOG or give him avastin alone.  His strength is improved, he continues to live alone, and reports that his confusion is much better as well.  He denies fevers, chills, nausea, vomiting, constipation, diarrhea, numbness, headaches, vision changes, further weakness, or any other concerns.  MEDICAL HISTORY: Past Medical History  Diagnosis Date  . Hyperlipidemia   . Right bundle branch block     Normal LV function normal aortic and mitral valve bedside echocardiogram 4 2012  . Tachycardia     Resolved  . Dyslipidemia   . Coronary artery disease (CAD) excluded     Cardiolite study 2006 within normal limits  . Hypertension   . Allergy     ALLERGIES:  is allergic to dilaudid and morphine and related.  MEDICATIONS:  Current Outpatient Prescriptions  Medication Sig Dispense Refill  . ALPRAZolam (XANAX) 0.25 MG tablet Take 1 tablet (0.25 mg total) by mouth at bedtime as needed for sleep.  30 tablet  0  . calcium carbonate (TUMS - DOSED IN MG ELEMENTAL CALCIUM) 500 MG chewable tablet Chew 1 tablet by mouth daily. For indigestion      . dexamethasone (DECADRON) 4 MG tablet Take 1 tablet (4 mg total) by mouth every 6 (six) hours. Take 1 pill twice daily for 2 weeks then 1/2 pill twice daily until further instructions  120 tablet  1  . diltiazem (CARDIZEM CD) 120 MG 24 hr capsule Take 1 capsule (120 mg total) by mouth daily.  90 capsule  3  . fluconazole (DIFLUCAN) 100 MG tablet Take 100 mg by mouth daily.      Marland Kitchen levETIRAcetam (KEPPRA) 500 MG tablet Take 500 mg by mouth at bedtime. Changed by Dr. Joya Salm 04/11/13      . metoprolol tartrate (LOPRESSOR) 25 MG tablet Take 25 mg by mouth 2 (two) times daily.       . ondansetron (ZOFRAN) 8 MG tablet Take 1/2 hour prior to temodar daily  30 tablet  3  . pantoprazole (PROTONIX) 40 MG tablet take 1 tablet by mouth once daily  30 tablet  6  .  sulfamethoxazole-trimethoprim (BACTRIM DS,SEPTRA DS) 800-160 MG per tablet Take 1 pill daily  30 tablet  5  . temozolomide (TEMODAR) 250 MG capsule Take 1 capsule (250 mg total) by mouth daily. May take on an empty stomach or at bedtime to decrease nausea & vomiting.  5 capsule  4   No current facility-administered medications for this visit.    SURGICAL HISTORY:  Past Surgical History  Procedure Laterality Date  . Left inguinal hernia.  2004  . Craniotomy Left 03/29/2013    Procedure: CRANIOTOMY TUMOR EXCISION;  Surgeon: Floyce Stakes, MD;  Location: [facility];  Service: Neurosurgery;  Laterality: Left;  Left Craniotomy for tumor excision    REVIEW OF SYSTEMS:  A 10 point review of systems was conducted and is otherwise negative except for what is noted above.     PHYSICAL EXAMINATION: Blood pressure 164/98, pulse 83, temperature 97.7 F (36.5 C), temperature source Oral, resp. rate 20, height 6' (1.829 m), weight 200 lb 4.8 oz (90.855 kg). Body mass index is 27.16 kg/(m^2). General: Patient is a well appearing male in no acute distress HEENT: PERRLA, sclerae anicteric no conjunctival pallor, MMM Neck: supple, no palpable adenopathy Lungs: clear to auscultation bilaterally, no wheezes, rhonchi, or rales Cardiovascular: regular rate rhythm, S1, S2, no murmurs, rubs or gallops Abdomen: Soft, non-tender, non-distended, normoactive bowel sounds, no HSM Extremities: warm and well perfused, no clubbing, cyanosis, or edema Skin: No rashes or lesions Neuro: CN II-XII intact, strength improved to 4+/5 on right and 5/5 on left ECOG PERFORMANCE STATUS: 1 - Symptomatic but completely ambulatory    LABORATORY DATA: Lab Results  Component Value Date   WBC 7.8 09/08/2013   HGB 13.8 09/08/2013   HCT 39.8 09/08/2013   MCV 98.0 09/08/2013   PLT 227 09/08/2013      Chemistry      Component Value Date/Time   NA 142 09/08/2013 1148   NA 136 03/30/2013 0430   K 4.7 09/08/2013 1148   K 4.4  03/30/2013 0430   CL 102 06/05/2013 1406   CL 100 03/30/2013 0430   CO2 25 09/08/2013 1148   CO2 29 03/30/2013 0430   BUN 18.1 09/08/2013 1148   BUN 21 03/30/2013 0430   CREATININE 1.0 09/08/2013 1148   CREATININE 0.81 03/30/2013 0430      Component Value Date/Time   CALCIUM 9.4 09/08/2013 1148   CALCIUM 8.5 03/30/2013 0430   ALKPHOS 53 09/08/2013 1148   ALKPHOS 50 03/29/2013 0500   AST 22 09/08/2013 1148   AST 19 03/29/2013 0500   ALT 61* 09/08/2013 1148   ALT 101* 03/29/2013 0500   BILITOT 0.37 09/08/2013 1148   BILITOT 0.5 03/29/2013 0500       RADIOGRAPHIC STUDIES:  No results found.  ASSESSMENT: 57 year old gentleman with  #1 WHO grade 4 glioblastoma status post debulking. He went on to  receive concurrent radiation therapy and Temodar 75 mg per meter squared. He completed radiation therapy from 04/27/2013 through 06/07/2013.  #2 patient to begin adjuvant Temodar at 175 mg per meter squared. His total dose is 250 mg days 1 through 5 on a 28 day cycle. We explained the risks benefits side effects of this as well as rationale.  #3 Patient came into clinic on 9/19 with progressive confusion and weakness.  MRI demonstrated progression and midline shift.  Patient was admitted to the hospital and administered IV steroids and seen by PT.  He was discharged home on 9/22 and we are now evaluating him for the RTOG trial, or every 2 week Avastin.    PLAN:  #1 Doing well.  Patient will have PET of brain on 10/7.  He will also need a port placed, and undergo chemotherapy class.  I have requested appointments for the two latter.    #2 We discussed Avastin and I gave him information to read in his AVS.    #3 He will return for f/u with Dr. Humphrey Rolls on 10/6.      All questions were answered. The patient knows to call the clinic with any problems, questions or concerns. We can certainly see the patient much sooner if necessary.  I spent 25 minutes counseling the patient face to face. The total time spent in  the appointment was 30 minutes.  Suzan Garibaldi [place], [place] Phone: [phone] 09/14/2013, 6:08 AM    ATTENDING'S ATTESTATION:  I personally reviewed patient's chart, examined patient myself, formulated the treatment plan as followed.    Patient returns in followup after he was hospitalized. He did have progression of disease but the MRI that was performed in the hospital. He is on increasing doses of corticosteroids. He was seen by our research nurses and he will be enrolled on RTOG clinical trial his Avastin 2 weeks. Patient will need a Port-A-Cath placed and I have informed him of this. We'll also need a PET scan performed which on 09/26/2013. Will also need chemotherapy teaching class. I will plan on seeing him back after he has placed.  Marcy Panning, MD Medical/Oncology [facility] [phone] (beeper) [phone] (Office)  09/21/2013, 11:17 AM

## 2013-09-13 NOTE — Telephone Encounter (Signed)
appts made and printed. Pt is aware that Inetta Fermo stated she will call him w/ appt...td

## 2013-09-15 ENCOUNTER — Other Ambulatory Visit: Payer: Self-pay | Admitting: Radiology

## 2013-09-15 ENCOUNTER — Telehealth: Payer: Self-pay | Admitting: Radiation Oncology

## 2013-09-15 NOTE — Telephone Encounter (Signed)
Message copied by Agnes Lawrence on Fri Sep 15, 2013  3:50 PM ------      Message from: [place], [place]      Created: Fri Sep 15, 2013  1:05 PM      Regarding: RE: Prescription Refill       Please refill diflucan and xanax.                  .      ----- Message -----         From: Agnes Lawrence, RN         Sent: 09/15/2013  11:28 AM           To: Oneita Hurt, MD      Subject: Prescription Refill                                      Dr. Kathrynn Running.            Guinn's sister, Lynden Ang, called this morning. She reports that Benson has a history of kidney stones. She reports that today he is experiencing left lateral lower back pain in waves. I explained that I was unable to tell her over the phone if Ziair had a kidney stone. She reports his stomach is distended and that he has had a few very small bowel movements. Encouraged Olegario Messier to have Trevaris try a laxative such a Senna S in the hopes that moving his bowels would relieve some of the discomfort he is experiencing. She reports that Karee doesn't want to come in to be seen. Encouraged her to have him seen in the ED/PCP/urgent care or in our clinic if the pain gets worse. Also, she reports "the yeast is back in Mckenzie's mouth." She reports that he completed his diflucan 2-3 days ago. Also, she reports that he needs a refill on his xanax. Olegario Messier request these prescription be called to [facility], [place] [phone]. Patient is scheduled to have his port placed Tuesday and a PET in October.             Sam, RN       ------

## 2013-09-15 NOTE — Telephone Encounter (Signed)
Per Dr. Broadus John order called in Diflucan 100 mg po. Patient to take 2 tablets today then, one tablet qd for ten day. Spoke with Baxter Hire, pharmacist at [facility], [place]. Also, phoned in Xanax 0.25 mg one tablet per night to aid in sleep, qty 30, no refills. Phoned patient's sister, Olegario Messier, to inform her this had been done and would be ready for pick up in one hour. Patient verbalized understanding and expressed appreciation for the call.

## 2013-09-18 ENCOUNTER — Other Ambulatory Visit: Payer: BC Managed Care – PPO

## 2013-09-18 ENCOUNTER — Telehealth: Payer: Self-pay | Admitting: Oncology

## 2013-09-19 ENCOUNTER — Ambulatory Visit (HOSPITAL_COMMUNITY)
Admission: RE | Admit: 2013-09-19 | Discharge: 2013-09-19 | Disposition: A | Discharge status: Home / Self Care | Payer: BC Managed Care – PPO | Source: Ambulatory Visit | Attending: Adult Health | Admitting: Adult Health

## 2013-09-19 ENCOUNTER — Other Ambulatory Visit: Payer: BC Managed Care – PPO

## 2013-09-19 ENCOUNTER — Encounter: Payer: Self-pay | Admitting: *Deleted

## 2013-09-19 ENCOUNTER — Telehealth: Payer: Self-pay | Admitting: Emergency Medicine

## 2013-09-19 DIAGNOSIS — C713 Malignant neoplasm of parietal lobe: Secondary | ICD-10-CM

## 2013-09-19 NOTE — Telephone Encounter (Signed)
Call received from Micael Hampshire, patient's sister, stating that Martin Martin doesn't want a portacath. Olegario Messier asking if he absolutely has to have a portacath. Patient adamantly Refusing the portacath. Advised patient's sister that if he is a difficult peripheral stick that a portacath would be necessary. At this time patient strongly would prefer to not get a portacath and to receive the chemo peripherally. Called Tina in IR to advise her that the patient is canceling portacath today. Dr Welton Flakes aware.

## 2013-09-20 ENCOUNTER — Telehealth: Payer: Self-pay | Admitting: Radiation Oncology

## 2013-09-20 NOTE — Telephone Encounter (Signed)
Micael Hampshire, patient's sister, phoned. She reports that Huriel has decided not to get a port a cath placed.

## 2013-09-24 ENCOUNTER — Other Ambulatory Visit: Payer: Self-pay | Admitting: *Deleted

## 2013-09-24 DIAGNOSIS — C713 Malignant neoplasm of parietal lobe: Secondary | ICD-10-CM

## 2013-09-25 ENCOUNTER — Encounter: Payer: Self-pay | Admitting: Oncology

## 2013-09-25 ENCOUNTER — Ambulatory Visit (HOSPITAL_BASED_OUTPATIENT_CLINIC_OR_DEPARTMENT_OTHER): Payer: BC Managed Care – PPO | Admitting: Oncology

## 2013-09-25 ENCOUNTER — Other Ambulatory Visit (HOSPITAL_BASED_OUTPATIENT_CLINIC_OR_DEPARTMENT_OTHER): Payer: BC Managed Care – PPO | Admitting: Lab

## 2013-09-25 VITALS — BP 142/87 | HR 106 | Temp 98.2°F | Resp 20 | Ht 72.0 in | Wt 201.4 lb

## 2013-09-25 DIAGNOSIS — C713 Malignant neoplasm of parietal lobe: Secondary | ICD-10-CM

## 2013-09-25 LAB — CBC WITH DIFFERENTIAL/PLATELET
Basophils Absolute: 0 10*3/uL (ref 0.0–0.1)
Eosinophils Absolute: 0 10*3/uL (ref 0.0–0.5)
HCT: 39.6 % (ref 38.4–49.9)
HGB: 13.4 g/dL (ref 13.0–17.1)
LYMPH%: 1.2 % — ABNORMAL LOW (ref 14.0–49.0)
MCHC: 33.8 g/dL (ref 32.0–36.0)
MONO#: 0.6 10*3/uL (ref 0.1–0.9)
NEUT#: 13.3 10*3/uL — ABNORMAL HIGH (ref 1.5–6.5)
NEUT%: 94.3 % — ABNORMAL HIGH (ref 39.0–75.0)
Platelets: 171 10*3/uL (ref 140–400)
RDW: 16.5 % — ABNORMAL HIGH (ref 11.0–14.6)
WBC: 14.1 10*3/uL — ABNORMAL HIGH (ref 4.0–10.3)
lymph#: 0.2 10*3/uL — ABNORMAL LOW (ref 0.9–3.3)

## 2013-09-25 LAB — COMPREHENSIVE METABOLIC PANEL (CC13)
Albumin: 3.2 g/dL — ABNORMAL LOW (ref 3.5–5.0)
BUN: 19.6 mg/dL (ref 7.0–26.0)
CO2: 24 mEq/L (ref 22–29)
Calcium: 9 mg/dL (ref 8.4–10.4)
Chloride: 101 mEq/L (ref 98–109)
Creatinine: 0.8 mg/dL (ref 0.7–1.3)
Sodium: 138 mEq/L (ref 136–145)

## 2013-09-25 LAB — UA PROTEIN, DIPSTICK - CHCC: Protein, ur: 30 mg/dL

## 2013-09-25 NOTE — Progress Notes (Signed)
OFFICE PROGRESS NOTE  CC  [name], MD [place] [address] Dr Veatrice Kells Dr. Tyler Pita  DIAGNOSIS: 57 year old gentleman with glioblastoma of the left parietal brain status post biopsy/debulking in April 2014  PRIOR THERAPY:  #1presented on 03/24/2013 with increasing severe disorientation and confusion. Because of this he was seen in the urgent care. He was noted to have some ataxia as well as confusion and speech difficulty with visual disturbances as well. He had a CT of the head without contrast that revealed 6.4 cm area of low attenuation with surrounding vasogenic edema in the left parietal lobe. An MRI performed showed a large partially necrotic 36 x 47 x 39 mm lesion with surrounding vasogenic edema. CT of the chest and abdomen showed no primary tumor. Patient went on to have left parietal brain biopsy and resection that showed WHO grade 4 glioblastoma. Postop brain MRI showed subtotal decompression with some mild postoperative hemorrhage centrally with vasogenic edema had resolved. He has been seen by Dr. Tyler Pita for radiation treatment and he is now seeing me for discussion regarding concurrent and adjuvant chemotherapy for glioblastoma  #2 post surgery patient was begun on concurrent radiation and adjuvant chemotherapy consisting of Temodar at 75 mg per meter squared. He completed radiation on 06/07/2013 overall he tolerated it well. He tolerated the Temodar very nicely as well. He is maintained on Bactrim DS.  #3 patient will initiate adjuvant Temodar at 150 mg per meter squared given on a 28 day cycle on days one through 5 of every cycle. His total doses 250 mg daily x5 days. We will initiate cycle 1 as soon as he gets his prescription filled.  CURRENT THERAPY:Patient will begin Avastin every 2 weeks  INTERVAL HISTORY: Martin Martin 57 y.o. male returns for followup visit after hospital visit due to progression of GBM and right sided  weakness.  He was started on IV steroids and his weakness improved dramatically.  We are now determining whether or not we can get him on trial with RTOG or give him avastin alone. Patient still seems very confused. He is now living with his wife. Patient becomes at times quite irritated with my questioning.l.  He denies fevers, chills, nausea, vomiting, constipation, diarrhea, numbness, headaches, vision changes, further weakness, or any other concerns.  MEDICAL HISTORY: Past Medical History  Diagnosis Date  . Hyperlipidemia   . Right bundle branch block     Normal LV function normal aortic and mitral valve bedside echocardiogram 4 2012  . Tachycardia     Resolved  . Dyslipidemia   . Coronary artery disease (CAD) excluded     Cardiolite study 2006 within normal limits  . Hypertension   . Allergy     ALLERGIES:  is allergic to dilaudid and morphine and related.  MEDICATIONS:  Current Outpatient Prescriptions  Medication Sig Dispense Refill  . ALPRAZolam (XANAX) 0.25 MG tablet Take 1 tablet (0.25 mg total) by mouth at bedtime as needed for sleep.  30 tablet  0  . calcium carbonate (TUMS - DOSED IN MG ELEMENTAL CALCIUM) 500 MG chewable tablet Chew 1 tablet by mouth daily. For indigestion      . dexamethasone (DECADRON) 4 MG tablet Take 1 tablet (4 mg total) by mouth every 6 (six) hours. Take 1 pill twice daily for 2 weeks then 1/2 pill twice daily until further instructions  120 tablet  1  . diltiazem (CARDIZEM CD) 120 MG 24 hr capsule Take 1 capsule (  120 mg total) by mouth daily.  90 capsule  3  . fluconazole (DIFLUCAN) 100 MG tablet Take 100 mg by mouth daily.      Marland Kitchen levETIRAcetam (KEPPRA) 500 MG tablet Take 500 mg by mouth at bedtime. Changed by Dr. Joya Salm 04/11/13      . metoprolol tartrate (LOPRESSOR) 25 MG tablet Take 25 mg by mouth 2 (two) times daily.       . ondansetron (ZOFRAN) 8 MG tablet Take 1/2 hour prior to temodar daily  30 tablet  3  . pantoprazole (PROTONIX) 40 MG tablet  take 1 tablet by mouth once daily  30 tablet  6  . sulfamethoxazole-trimethoprim (BACTRIM DS,SEPTRA DS) 800-160 MG per tablet Take 1 pill daily  30 tablet  5  . temozolomide (TEMODAR) 250 MG capsule Take 1 capsule (250 mg total) by mouth daily. May take on an empty stomach or at bedtime to decrease nausea & vomiting.  5 capsule  4   No current facility-administered medications for this visit.    SURGICAL HISTORY:  Past Surgical History  Procedure Laterality Date  . Left inguinal hernia.  2004  . Craniotomy Left 03/29/2013    Procedure: CRANIOTOMY TUMOR EXCISION;  Surgeon: Floyce Stakes, MD;  Location: [facility];  Service: Neurosurgery;  Laterality: Left;  Left Craniotomy for tumor excision    REVIEW OF SYSTEMS:  A 10 point review of systems was conducted and is otherwise negative except for what is noted above.     PHYSICAL EXAMINATION: Blood pressure 142/87, pulse 106, temperature 98.2 F (36.8 C), temperature source Oral, resp. rate 20, height 6' (1.829 m), weight 201 lb 6.4 oz (91.354 kg). Body mass index is 27.31 kg/(m^2). General: Patient is a well appearing male in no acute distress HEENT: PERRLA, sclerae anicteric no conjunctival pallor, MMM Neck: supple, no palpable adenopathy Lungs: clear to auscultation bilaterally, no wheezes, rhonchi, or rales Cardiovascular: regular rate rhythm, S1, S2, no murmurs, rubs or gallops Abdomen: Soft, non-tender, non-distended, normoactive bowel sounds, no HSM Extremities: warm and well perfused, no clubbing, cyanosis, or edema Skin: No rashes or lesions Neuro: CN II-XII intact, strength improved to 4+/5 on right and 5/5 on left ECOG PERFORMANCE STATUS: 1 - Symptomatic but completely ambulatory    LABORATORY DATA: Lab Results  Component Value Date   WBC 14.1* 09/25/2013   HGB 13.4 09/25/2013   HCT 39.6 09/25/2013   MCV 97.5 09/25/2013   PLT 171 09/25/2013      Chemistry      Component Value Date/Time   NA 138 09/25/2013 1433   NA  136 03/30/2013 0430   K 4.5 09/25/2013 1433   K 4.4 03/30/2013 0430   CL 102 06/05/2013 1406   CL 100 03/30/2013 0430   CO2 24 09/25/2013 1433   CO2 29 03/30/2013 0430   BUN 19.6 09/25/2013 1433   BUN 21 03/30/2013 0430   CREATININE 0.8 09/25/2013 1433   CREATININE 0.81 03/30/2013 0430      Component Value Date/Time   CALCIUM 9.0 09/25/2013 1433   CALCIUM 8.5 03/30/2013 0430   ALKPHOS 57 09/25/2013 1433   ALKPHOS 50 03/29/2013 0500   AST 70* 09/25/2013 1433   AST 19 03/29/2013 0500   ALT 266* 09/25/2013 1433   ALT 101* 03/29/2013 0500   BILITOT 0.49 09/25/2013 1433   BILITOT 0.5 03/29/2013 0500       RADIOGRAPHIC STUDIES:  No results found.  ASSESSMENT: 57 year old gentleman with  #1 WHO grade 4 glioblastoma  status post debulking. He went on to receive concurrent radiation therapy and Temodar 75 mg per meter squared. He completed radiation therapy from 04/27/2013 through 06/07/2013.  #2 patient to begin adjuvant Temodar at 175 mg per meter squared. His total dose is 250 mg days 1 through 5 on a 28 day cycle. We explained the risks benefits side effects of this as well as rationale.  #3 Patient came into clinic on 9/19 with progressive confusion and weakness.  MRI demonstrated progression and midline shift.  Patient was admitted to the hospital and administered IV steroids and seen by PT.  He was discharged home on 9/22 and we are now evaluating him for the RTOG trial, or every 2 week Avastin.    #4 patient was seen today in to be enrolled on RTOG clinical trial. However I do not believe that he is a good candidate for this. I have therefore recommended that he not enroll on this I had an extensive discussionwith the patient as well as the sister. I do not think that he can make an informed decision or signed an informed consent.  #5 I do think it seems reasonable to start the patient on Avastin as a single agent. We discussed the risks benefits and side effects of this.  PLAN:  #1 patient will  proceed with his PET scan of the brain are scheduled for tomorrow.  #2 he will return tomorrow for Avastin only and this will be given every 2 weeks.  All questions were answered. The patient knows to call the clinic with any problems, questions or concerns. We can certainly see the patient much sooner if necessary.  I spent 25 minutes counseling the patient face to face. The total time spent in the appointment was 30 minutes.  Marcy Panning, MD Medical/Oncology [facility] [phone] (beeper) [phone] (Office)  09/25/2013, 3:38 PM

## 2013-09-26 ENCOUNTER — Ambulatory Visit: Payer: BC Managed Care – PPO | Admitting: Lab

## 2013-09-26 ENCOUNTER — Encounter (HOSPITAL_COMMUNITY)
Admission: RE | Admit: 2013-09-26 | Discharge: 2013-09-26 | Disposition: A | Discharge status: Home / Self Care | Payer: BC Managed Care – PPO | Source: Ambulatory Visit | Attending: Radiation Oncology | Admitting: Radiation Oncology

## 2013-09-26 ENCOUNTER — Telehealth: Payer: Self-pay | Admitting: Oncology

## 2013-09-26 ENCOUNTER — Ambulatory Visit (HOSPITAL_BASED_OUTPATIENT_CLINIC_OR_DEPARTMENT_OTHER): Payer: BC Managed Care – PPO

## 2013-09-26 ENCOUNTER — Encounter (HOSPITAL_COMMUNITY): Payer: Self-pay

## 2013-09-26 VITALS — BP 143/89 | HR 92 | Temp 96.9°F

## 2013-09-26 DIAGNOSIS — Z5112 Encounter for antineoplastic immunotherapy: Secondary | ICD-10-CM

## 2013-09-26 DIAGNOSIS — Z923 Personal history of irradiation: Secondary | ICD-10-CM | POA: Insufficient documentation

## 2013-09-26 DIAGNOSIS — C713 Malignant neoplasm of parietal lobe: Secondary | ICD-10-CM

## 2013-09-26 HISTORY — DX: Malignant (primary) neoplasm, unspecified: C80.1

## 2013-09-26 MED ORDER — FLUDEOXYGLUCOSE F - 18 (FDG) INJECTION
10.4000 | Freq: Once | INTRAVENOUS | Status: AC | PRN
Start: 2013-09-26 — End: 2013-09-26
  Administered 2013-09-26: 10.4 via INTRAVENOUS

## 2013-09-26 MED ORDER — SODIUM CHLORIDE 0.9 % IV SOLN
Freq: Once | INTRAVENOUS | Status: AC
  Administered 2013-09-26: 10:00:00 via INTRAVENOUS

## 2013-09-26 MED ORDER — SODIUM CHLORIDE 0.9 % IV SOLN
10.0000 mg/kg | Freq: Once | INTRAVENOUS | Status: AC
  Administered 2013-09-26: 925 mg via INTRAVENOUS
  Filled 2013-09-26: qty 37

## 2013-09-26 NOTE — Patient Instructions (Signed)
[facility] Discharge Instructions for Patients Receiving Chemotherapy  Today you received the following chemotherapy agents Avastin To help prevent nausea and vomiting after your treatment, we encourage you to take your nausea medication as prescribed. If you develop nausea and vomiting that is not controlled by your nausea medication, call the clinic.   BELOW ARE SYMPTOMS THAT SHOULD BE REPORTED IMMEDIATELY:  *FEVER GREATER THAN 100.5 F  *CHILLS WITH OR WITHOUT FEVER  NAUSEA AND VOMITING THAT IS NOT CONTROLLED WITH YOUR NAUSEA MEDICATION  *UNUSUAL SHORTNESS OF BREATH  *UNUSUAL BRUISING OR BLEEDING  TENDERNESS IN MOUTH AND THROAT WITH OR WITHOUT PRESENCE OF ULCERS  *URINARY PROBLEMS  *BOWEL PROBLEMS  UNUSUAL RASH Items with * indicate a potential emergency and should be followed up as soon as possible.  Feel free to call the clinic you have any questions or concerns. The clinic phone number is [phone].    Bevacizumab injection (Avastin) What is this medicine? BEVACIZUMAB (be va SIZ yoo mab) is a chemotherapy drug. It targets a protein found in many cancer cell types, and halts cancer growth. This drug treats many cancers including non-small cell lung cancer, and colon or rectal cancer. It is usually given with other chemotherapy drugs. This medicine may be used for other purposes; ask your health care provider or pharmacist if you have questions. What should I tell my health care provider before I take this medicine? They need to know if you have any of these conditions: -blood clots -heart disease, including heart failure, heart attack, or chest pain (angina) -high blood pressure -infection (especially a virus infection such as chickenpox, cold sores, or herpes) -kidney disease -lung disease -prior chemotherapy with doxorubicin, daunorubicin, epirubicin, or other anthracycline type chemotherapy agents -recent or ongoing radiation therapy -recent  surgery -stroke -an unusual or allergic reaction to bevacizumab, hamster proteins, mouse proteins, other medicines, foods, dyes, or preservatives -pregnant or trying to get pregnant -breast-feeding How should I use this medicine? This medicine is for infusion into a vein. It is given by a health care professional in a hospital or clinic setting. Talk to your pediatrician regarding the use of this medicine in children. Special care may be needed. Overdosage: If you think you have taken too much of this medicine contact a poison control center or emergency room at once. NOTE: This medicine is only for you. Do not share this medicine with others. What if I miss a dose? It is important not to miss your dose. Call your doctor or health care professional if you are unable to keep an appointment. What may interact with this medicine? Interactions are not expected. This list may not describe all possible interactions. Give your health care provider a list of all the medicines, herbs, non-prescription drugs, or dietary supplements you use. Also tell them if you smoke, drink alcohol, or use illegal drugs. Some items may interact with your medicine. What should I watch for while using this medicine? Your condition will be monitored carefully while you are receiving this medicine. You will need important blood work and urine testing done while you are taking this medicine. During your treatment, let your health care professional know if you have any unusual symptoms, such as difficulty breathing. This medicine may rarely cause 'gastrointestinal perforation' (holes in the stomach, intestines or colon), a serious side effect requiring surgery to repair. This medicine should be started at least 28 days following major surgery and the site of the surgery should be totally healed. Check  with your doctor before scheduling dental work or surgery while you are receiving this treatment. Talk to your doctor if you have  recently had surgery or if you have a wound that has not healed. Do not become pregnant while taking this medicine. Women should inform their doctor if they wish to become pregnant or think they might be pregnant. There is a potential for serious side effects to an unborn child. Talk to your health care professional or pharmacist for more information. Do not breast-feed an infant while taking this medicine. This medicine has caused ovarian failure in some women. This medicine may interfere with the ability to have a child. You should talk to your doctor or health care professional if you are concerned about your fertility. What side effects may I notice from receiving this medicine? Side effects that you should report to your doctor or health care professional as soon as possible: -allergic reactions like skin rash, itching or hives, swelling of the face, lips, or tongue -signs of infection - fever or chills, cough, sore throat, pain or trouble passing urine -signs of decreased platelets or bleeding - bruising, pinpoint red spots on the skin, black, tarry stools, nosebleeds, blood in the urine -breathing problems -changes in vision -chest pain -confusion -jaw pain, especially after dental work -mouth sores -seizures -severe abdominal pain -severe headache -sudden numbness or weakness of the face, arm or leg -swelling of legs or ankles -symptoms of a stroke: change in mental awareness, inability to talk or move one side of the body (especially in patients with lung cancer) -trouble passing urine or change in the amount of urine -trouble speaking or understanding -trouble walking, dizziness, loss of balance or coordination Side effects that usually do not require medical attention (report to your doctor or health care professional if they continue or are bothersome): -constipation -diarrhea -dry skin -headache -loss of appetite -nausea, vomiting This list may not describe all possible  side effects. Call your doctor for medical advice about side effects. You may report side effects to FDA at 1-800-FDA-1088. Where should I keep my medicine? This drug is given in a hospital or clinic and will not be stored at home. NOTE: This sheet is a summary. It may not cover all possible information. If you have questions about this medicine, talk to your doctor, pharmacist, or health care provider.  2013, [employer]. (11/07/2010 4:25:37 PM)

## 2013-09-26 NOTE — Telephone Encounter (Signed)
gv and printed appt sched and avs for pt for OCT...MW added tx.  °

## 2013-09-27 ENCOUNTER — Telehealth: Payer: Self-pay | Admitting: *Deleted

## 2013-09-27 NOTE — Telephone Encounter (Signed)
Patient's family called and moved appt from Tuesday to Monday

## 2013-09-29 ENCOUNTER — Telehealth: Payer: Self-pay | Admitting: Radiation Oncology

## 2013-09-29 NOTE — Telephone Encounter (Signed)
Message copied by Agnes Lawrence on Fri Sep 29, 2013  5:13 PM ------      Message from: [place], [place]      Created: Fri Sep 29, 2013  4:49 PM      Regarding: RE: decadron       Yes            ----- Message -----         From: Agnes Lawrence, RN         Sent: 09/29/2013   2:04 PM           To: Oneita Hurt, MD      Subject: decadron                                                 Dr. Kathrynn Running.            I received a call from Micael Hampshire, Sigel sister. Currently Hezakiah is taking 4 mg of decadron in the morning and 4 mg at night. Over the weekend he will taper to 2 mg in the morning and 2 mg at night until further notice. Originally he had a follow up to review MRI and discuss taper on 10/29 but, that was cancelled because the MRI was cancelled. He is scheduled to follow up with you on 11/23/2013. Should he continue decadron 2 mg bid until then?            Sam, RN       ------

## 2013-09-29 NOTE — Telephone Encounter (Signed)
Phoned Micael Hampshire, patient's sister, back. Per Dr. Kathrynn Running advised that Martin Martin continue Decadron 2 mg bid until appointment on 11/23/2013. She verbalized understanding and expressed appreciation for the call.

## 2013-09-30 ENCOUNTER — Other Ambulatory Visit: Payer: Self-pay | Admitting: Radiation Oncology

## 2013-10-09 ENCOUNTER — Encounter: Payer: Self-pay | Admitting: Adult Health

## 2013-10-09 ENCOUNTER — Ambulatory Visit (HOSPITAL_BASED_OUTPATIENT_CLINIC_OR_DEPARTMENT_OTHER): Payer: BC Managed Care – PPO

## 2013-10-09 ENCOUNTER — Ambulatory Visit (HOSPITAL_BASED_OUTPATIENT_CLINIC_OR_DEPARTMENT_OTHER): Payer: BC Managed Care – PPO | Admitting: Adult Health

## 2013-10-09 ENCOUNTER — Other Ambulatory Visit: Payer: Self-pay | Admitting: Oncology

## 2013-10-09 ENCOUNTER — Telehealth: Payer: Self-pay | Admitting: *Deleted

## 2013-10-09 ENCOUNTER — Other Ambulatory Visit (HOSPITAL_BASED_OUTPATIENT_CLINIC_OR_DEPARTMENT_OTHER): Payer: BC Managed Care – PPO | Admitting: Lab

## 2013-10-09 VITALS — BP 149/93 | HR 120 | Temp 97.5°F | Resp 18 | Ht 72.0 in | Wt 201.7 lb

## 2013-10-09 VITALS — BP 129/88 | HR 111

## 2013-10-09 DIAGNOSIS — C713 Malignant neoplasm of parietal lobe: Secondary | ICD-10-CM

## 2013-10-09 DIAGNOSIS — Z5112 Encounter for antineoplastic immunotherapy: Secondary | ICD-10-CM

## 2013-10-09 DIAGNOSIS — R000 Tachycardia, unspecified: Secondary | ICD-10-CM

## 2013-10-09 LAB — COMPREHENSIVE METABOLIC PANEL (CC13)
ALT: 124 U/L — ABNORMAL HIGH (ref 0–55)
AST: 30 U/L (ref 5–34)
Alkaline Phosphatase: 89 U/L (ref 40–150)
BUN: 19.6 mg/dL (ref 7.0–26.0)
Calcium: 9.3 mg/dL (ref 8.4–10.4)
Chloride: 103 mEq/L (ref 98–109)
Creatinine: 0.7 mg/dL (ref 0.7–1.3)
Sodium: 141 mEq/L (ref 136–145)

## 2013-10-09 LAB — CBC WITH DIFFERENTIAL/PLATELET
BASO%: 0.6 % (ref 0.0–2.0)
Eosinophils Absolute: 0 10*3/uL (ref 0.0–0.5)
MCHC: 33.8 g/dL (ref 32.0–36.0)
MCV: 98.2 fL — ABNORMAL HIGH (ref 79.3–98.0)
MONO#: 0.6 10*3/uL (ref 0.1–0.9)
MONO%: 6.3 % (ref 0.0–14.0)
NEUT#: 8.6 10*3/uL — ABNORMAL HIGH (ref 1.5–6.5)
RBC: 3.83 10*6/uL — ABNORMAL LOW (ref 4.20–5.82)
RDW: 16.1 % — ABNORMAL HIGH (ref 11.0–14.6)
WBC: 9.7 10*3/uL (ref 4.0–10.3)
nRBC: 1 % — ABNORMAL HIGH (ref 0–0)

## 2013-10-09 LAB — UA PROTEIN, DIPSTICK - CHCC: Protein, ur: NEGATIVE mg/dL

## 2013-10-09 MED ORDER — SODIUM CHLORIDE 0.9 % IV SOLN
Freq: Once | INTRAVENOUS | Status: AC
  Administered 2013-10-09: 15:00:00 via INTRAVENOUS

## 2013-10-09 MED ORDER — SODIUM CHLORIDE 0.9 % IV SOLN
10.0000 mg/kg | Freq: Once | INTRAVENOUS | Status: AC
  Administered 2013-10-09: 925 mg via INTRAVENOUS
  Filled 2013-10-09: qty 37

## 2013-10-09 NOTE — Patient Instructions (Signed)
[facility] Discharge Instructions for Patients Receiving Chemotherapy  Today you received the following chemotherapy agents :  Avastin.  To help prevent nausea and vomiting after your treatment, we encourage you to take your nausea medication as instructed by your physician.   If you develop nausea and vomiting that is not controlled by your nausea medication, call the clinic.   BELOW ARE SYMPTOMS THAT SHOULD BE REPORTED IMMEDIATELY:  *FEVER GREATER THAN 100.5 F  *CHILLS WITH OR WITHOUT FEVER  NAUSEA AND VOMITING THAT IS NOT CONTROLLED WITH YOUR NAUSEA MEDICATION  *UNUSUAL SHORTNESS OF BREATH  *UNUSUAL BRUISING OR BLEEDING  TENDERNESS IN MOUTH AND THROAT WITH OR WITHOUT PRESENCE OF ULCERS  *URINARY PROBLEMS  *BOWEL PROBLEMS  UNUSUAL RASH Items with * indicate a potential emergency and should be followed up as soon as possible.  Feel free to call the clinic you have any questions or concerns. The clinic phone number is [phone].

## 2013-10-09 NOTE — Telephone Encounter (Signed)
Per staff message and POF I have scheduled appts.  JMW  

## 2013-10-09 NOTE — Progress Notes (Signed)
OFFICE PROGRESS NOTE  CC  [name], MD [address] [place] [address] Dr Eliane Decree Dr. Margaretmary Dys  DIAGNOSIS: 57 year old gentleman with glioblastoma of the left parietal brain status post biopsy/debulking in April 2014  PRIOR THERAPY:  #1presented on 03/24/2013 with increasing severe disorientation and confusion. Because of this he was seen in the urgent care. He was noted to have some ataxia as well as confusion and speech difficulty with visual disturbances as well. He had a CT of the head without contrast that revealed 6.4 cm area of low attenuation with surrounding vasogenic edema in the left parietal lobe. An MRI performed showed a large partially necrotic 36 x 47 x 39 mm lesion with surrounding vasogenic edema. CT of the chest and abdomen showed no primary tumor. Patient went on to have left parietal brain biopsy and resection that showed WHO grade 4 glioblastoma. Postop brain MRI showed subtotal decompression with some mild postoperative hemorrhage centrally with vasogenic edema had resolved. He has been seen by Dr. Margaretmary Dys for radiation treatment and he is now seeing me for discussion regarding concurrent and adjuvant chemotherapy for glioblastoma  #2 post surgery patient was begun on concurrent radiation and adjuvant chemotherapy consisting of Temodar at 75 mg per meter squared. He completed radiation on 06/07/2013 overall he tolerated it well. He tolerated the Temodar very nicely as well. He was maintained on Bactrim DS.  #3 patient started adjuvant Temodar at 150 mg per meter squared given on a 28 day cycle on days one through 5 of every cycle. His total doses 250 mg daily x5 days.  He received this from July through September, 2014.     #4  Patient was found to have progression in September, 2014.  He was started on Avastin 10mg /kg q 14 days started on 09/25/13.  He gets this peripherally.    CURRENT THERAPY:  Avastin  INTERVAL HISTORY: Martin Martin 57 y.o. male returns for followup visit to evaluate him prior to Avastin therapy.  He denies fevers, chills, nausea, vomiting, constipation, diarrhea, increased weakness, numbness, headaches, vision changes, or any other concerns.    MEDICAL HISTORY: Past Medical History  Diagnosis Date  . Hyperlipidemia   . Right bundle branch block     Normal LV function normal aortic and mitral valve bedside echocardiogram 4 2012  . Tachycardia     Resolved  . Dyslipidemia   . Coronary artery disease (CAD) excluded     Cardiolite study 2006 within normal limits  . Hypertension   . Allergy   . Cancer     ALLERGIES:  is allergic to dilaudid and morphine and related.  MEDICATIONS:  Current Outpatient Prescriptions  Medication Sig Dispense Refill  . ALPRAZolam (XANAX) 0.25 MG tablet Take 1 tablet (0.25 mg total) by mouth at bedtime as needed for sleep.  30 tablet  0  . calcium carbonate (TUMS - DOSED IN MG ELEMENTAL CALCIUM) 500 MG chewable tablet Chew 1 tablet by mouth daily. For indigestion      . dexamethasone (DECADRON) 4 MG tablet Take 1 tablet (4 mg total) by mouth every 6 (six) hours. Take 1 pill twice daily for 2 weeks then 1/2 pill twice daily until further instructions  120 tablet  1  . diltiazem (CARDIZEM CD) 120 MG 24 hr capsule Take 1 capsule (120 mg total) by mouth daily.  90 capsule  3  . fluconazole (DIFLUCAN) 100 MG tablet TAKE 2 TABLETS TODAY THEN TAKE 1 TABLET DAILY  FOR 10 DAYS  12 tablet  0  . levETIRAcetam (KEPPRA) 500 MG tablet Take 500 mg by mouth at bedtime. Changed by Dr. Jeral Fruit 04/11/13      . metoprolol tartrate (LOPRESSOR) 25 MG tablet Take 25 mg by mouth 2 (two) times daily.       . ondansetron (ZOFRAN) 8 MG tablet Take 1/2 hour prior to temodar daily  30 tablet  3  . pantoprazole (PROTONIX) 40 MG tablet take 1 tablet by mouth once daily  30 tablet  6  . sulfamethoxazole-trimethoprim (BACTRIM DS,SEPTRA DS) 800-160 MG per tablet Take 1 pill daily  30 tablet  5  .  temozolomide (TEMODAR) 250 MG capsule Take 1 capsule (250 mg total) by mouth daily. May take on an empty stomach or at bedtime to decrease nausea & vomiting.  5 capsule  4   No current facility-administered medications for this visit.    SURGICAL HISTORY:  Past Surgical History  Procedure Laterality Date  . Left inguinal hernia.  2004  . Craniotomy Left 03/29/2013    Procedure: CRANIOTOMY TUMOR EXCISION;  Surgeon: Karn Cassis, MD;  Location: [facility];  Service: Neurosurgery;  Laterality: Left;  Left Craniotomy for tumor excision    REVIEW OF SYSTEMS:  A 10 point review of systems was conducted and is otherwise negative except for what is noted above.     PHYSICAL EXAMINATION: Blood pressure 149/93, pulse 120, temperature 97.5 F (36.4 C), temperature source Oral, resp. rate 18, height 6' (1.829 m), weight 201 lb 11.2 oz (91.491 kg). Body mass index is 27.35 kg/(m^2). General: Patient is a well appearing male in no acute distress HEENT: PERRLA, sclerae anicteric no conjunctival pallor, MMM Neck: supple, no palpable adenopathy Lungs: clear to auscultation bilaterally, no wheezes, rhonchi, or rales Cardiovascular: regular rhythm, slightly tachycardic, S1, S2, no murmurs, rubs or gallops Abdomen: Soft, non-tender, non-distended, normoactive bowel sounds, no HSM Extremities: warm and well perfused, no clubbing, cyanosis, or edema Skin: No rashes or lesions Neuro: CN II-XII intact, strength improved to 4+/5 on right and 5/5 on left ECOG PERFORMANCE STATUS: 1 - Symptomatic but completely ambulatory    LABORATORY DATA: Lab Results  Component Value Date   WBC 9.7 10/09/2013   HGB 12.7* 10/09/2013   HCT 37.6* 10/09/2013   MCV 98.2* 10/09/2013   PLT 232 10/09/2013      Chemistry      Component Value Date/Time   NA 138 09/25/2013 1433   NA 136 03/30/2013 0430   K 4.5 09/25/2013 1433   K 4.4 03/30/2013 0430   CL 102 06/05/2013 1406   CL 100 03/30/2013 0430   CO2 24 09/25/2013  1433   CO2 29 03/30/2013 0430   BUN 19.6 09/25/2013 1433   BUN 21 03/30/2013 0430   CREATININE 0.8 09/25/2013 1433   CREATININE 0.81 03/30/2013 0430      Component Value Date/Time   CALCIUM 9.0 09/25/2013 1433   CALCIUM 8.5 03/30/2013 0430   ALKPHOS 57 09/25/2013 1433   ALKPHOS 50 03/29/2013 0500   AST 70* 09/25/2013 1433   AST 19 03/29/2013 0500   ALT 266* 09/25/2013 1433   ALT 101* 03/29/2013 0500   BILITOT 0.49 09/25/2013 1433   BILITOT 0.5 03/29/2013 0500       RADIOGRAPHIC STUDIES:  No results found.  ASSESSMENT: 57 year old gentleman with  #1 WHO grade 4 glioblastoma status post debulking. He went on to receive concurrent radiation therapy and Temodar 75 mg per meter squared. He  completed radiation therapy from 04/27/2013 through 06/07/2013.  #2 patient to begin adjuvant Temodar at 175 mg per meter squared. His total dose is 250 mg days 1 through 5 on a 28 day cycle. We explained the risks benefits side effects of this as well as rationale.  #3 Patient came into clinic on 9/19 with progressive confusion and weakness.  MRI demonstrated progression and midline shift.  Patient was admitted to the hospital and administered IV steroids and seen by PT.  He was discharged home on 9/22 and was subsequently started on Avastin 10mg /kg every 14 days starting on 09/25/13.  PLAN:  #1 Doing well.  Labs are stable  Patient will proceed with Avastin today.  I reviewed the patient with Dr. Darnelle Catalan.    #2 He is tachycardic today.  We got an EKG that Dr. Darnelle Catalan reviewed.  He cleared Mr. Churchwell to proceed with Avastin.  I recommended Mr. Krasinski to f/u with cardiology.  I offered to assist in expediting this.    #3 He will return in 2 weeks for labs and his next avastin.    All questions were answered. The patient knows to call the clinic with any problems, questions or concerns. We can certainly see the patient much sooner if necessary.  I spent 25 minutes counseling the patient face to face. The  total time spent in the appointment was 30 minutes.  Illa Level, NP Medical Oncology [facility] [phone] 10/09/2013, 1:43 PM

## 2013-10-09 NOTE — Telephone Encounter (Signed)
appts made and printed. Pt is aware that tx will be added. i emailed MW to add the tx's...td 

## 2013-10-09 NOTE — Patient Instructions (Signed)
Doing well.  Proceed with Avastin.  We will see you back in 2 weeks.  Please call us if you have any questions or concerns.

## 2013-10-10 ENCOUNTER — Ambulatory Visit: Payer: BC Managed Care – PPO

## 2013-10-11 ENCOUNTER — Telehealth: Payer: Self-pay | Admitting: *Deleted

## 2013-10-11 NOTE — Telephone Encounter (Signed)
Received call from sister Micael Hampshire re:  Pt has developed lower abdominal pain - from navel down.  Pt also has constipation problem.  Sister was not sure when pt's last BM.  Olegario Messier stated pt was all swollen from the steroids.  Pt has no pain meds, and probably will not take due to pt taking too many medications already.  Informed Olegario Messier that message will be relayed to either md or nurse practitioner for further instructions.  Olegario Messier voiced understanding. Kathy's  Phone   [phone].

## 2013-10-12 ENCOUNTER — Emergency Department (HOSPITAL_COMMUNITY): Payer: BC Managed Care – PPO

## 2013-10-12 ENCOUNTER — Encounter (HOSPITAL_COMMUNITY): Payer: Self-pay | Admitting: Emergency Medicine

## 2013-10-12 ENCOUNTER — Inpatient Hospital Stay (HOSPITAL_COMMUNITY)
Admission: EM | Admit: 2013-10-12 | Discharge: 2013-11-02 | DRG: 391 | Disposition: A | Discharge status: Hospice | Payer: BC Managed Care – PPO | Attending: Internal Medicine | Admitting: Internal Medicine

## 2013-10-12 ENCOUNTER — Other Ambulatory Visit: Payer: Self-pay | Admitting: Oncology

## 2013-10-12 ENCOUNTER — Other Ambulatory Visit: Payer: Self-pay

## 2013-10-12 DIAGNOSIS — G40109 Localization-related (focal) (partial) symptomatic epilepsy and epileptic syndromes with simple partial seizures, not intractable, without status epilepticus: Secondary | ICD-10-CM | POA: Diagnosis not present

## 2013-10-12 DIAGNOSIS — R29898 Other symptoms and signs involving the musculoskeletal system: Secondary | ICD-10-CM | POA: Diagnosis present

## 2013-10-12 DIAGNOSIS — K5732 Diverticulitis of large intestine without perforation or abscess without bleeding: Principal | ICD-10-CM | POA: Diagnosis present

## 2013-10-12 DIAGNOSIS — R7309 Other abnormal glucose: Secondary | ICD-10-CM | POA: Diagnosis present

## 2013-10-12 DIAGNOSIS — K560 Paralytic ileus: Secondary | ICD-10-CM | POA: Diagnosis not present

## 2013-10-12 DIAGNOSIS — K5720 Diverticulitis of large intestine with perforation and abscess without bleeding: Secondary | ICD-10-CM | POA: Diagnosis present

## 2013-10-12 DIAGNOSIS — Z9221 Personal history of antineoplastic chemotherapy: Secondary | ICD-10-CM

## 2013-10-12 DIAGNOSIS — Y838 Other surgical procedures as the cause of abnormal reaction of the patient, or of later complication, without mention of misadventure at the time of the procedure: Secondary | ICD-10-CM | POA: Diagnosis not present

## 2013-10-12 DIAGNOSIS — R739 Hyperglycemia, unspecified: Secondary | ICD-10-CM | POA: Diagnosis present

## 2013-10-12 DIAGNOSIS — G8190 Hemiplegia, unspecified affecting unspecified side: Secondary | ICD-10-CM | POA: Diagnosis present

## 2013-10-12 DIAGNOSIS — R198 Other specified symptoms and signs involving the digestive system and abdomen: Secondary | ICD-10-CM

## 2013-10-12 DIAGNOSIS — Z515 Encounter for palliative care: Secondary | ICD-10-CM

## 2013-10-12 DIAGNOSIS — Z66 Do not resuscitate: Secondary | ICD-10-CM | POA: Diagnosis not present

## 2013-10-12 DIAGNOSIS — R69 Illness, unspecified: Secondary | ICD-10-CM

## 2013-10-12 DIAGNOSIS — D72829 Elevated white blood cell count, unspecified: Secondary | ICD-10-CM | POA: Diagnosis not present

## 2013-10-12 DIAGNOSIS — E785 Hyperlipidemia, unspecified: Secondary | ICD-10-CM | POA: Diagnosis present

## 2013-10-12 DIAGNOSIS — IMO0002 Reserved for concepts with insufficient information to code with codable children: Secondary | ICD-10-CM

## 2013-10-12 DIAGNOSIS — Z0389 Encounter for observation for other suspected diseases and conditions ruled out: Secondary | ICD-10-CM

## 2013-10-12 DIAGNOSIS — Z87891 Personal history of nicotine dependence: Secondary | ICD-10-CM

## 2013-10-12 DIAGNOSIS — I10 Essential (primary) hypertension: Secondary | ICD-10-CM | POA: Diagnosis present

## 2013-10-12 DIAGNOSIS — A498 Other bacterial infections of unspecified site: Secondary | ICD-10-CM | POA: Diagnosis present

## 2013-10-12 DIAGNOSIS — Z923 Personal history of irradiation: Secondary | ICD-10-CM

## 2013-10-12 DIAGNOSIS — R569 Unspecified convulsions: Secondary | ICD-10-CM

## 2013-10-12 DIAGNOSIS — T380X5A Adverse effect of glucocorticoids and synthetic analogues, initial encounter: Secondary | ICD-10-CM | POA: Diagnosis present

## 2013-10-12 DIAGNOSIS — R5381 Other malaise: Secondary | ICD-10-CM | POA: Diagnosis present

## 2013-10-12 DIAGNOSIS — T8131XA Disruption of external operation (surgical) wound, not elsewhere classified, initial encounter: Secondary | ICD-10-CM | POA: Diagnosis not present

## 2013-10-12 DIAGNOSIS — K929 Disease of digestive system, unspecified: Secondary | ICD-10-CM | POA: Diagnosis not present

## 2013-10-12 DIAGNOSIS — Z79899 Other long term (current) drug therapy: Secondary | ICD-10-CM

## 2013-10-12 DIAGNOSIS — K651 Peritoneal abscess: Secondary | ICD-10-CM | POA: Diagnosis present

## 2013-10-12 DIAGNOSIS — B370 Candidal stomatitis: Secondary | ICD-10-CM | POA: Diagnosis present

## 2013-10-12 DIAGNOSIS — R531 Weakness: Secondary | ICD-10-CM | POA: Diagnosis present

## 2013-10-12 DIAGNOSIS — C719 Malignant neoplasm of brain, unspecified: Secondary | ICD-10-CM

## 2013-10-12 DIAGNOSIS — D649 Anemia, unspecified: Secondary | ICD-10-CM | POA: Diagnosis present

## 2013-10-12 DIAGNOSIS — Z113 Encounter for screening for infections with a predominantly sexual mode of transmission: Secondary | ICD-10-CM

## 2013-10-12 DIAGNOSIS — E878 Other disorders of electrolyte and fluid balance, not elsewhere classified: Secondary | ICD-10-CM | POA: Diagnosis present

## 2013-10-12 DIAGNOSIS — E871 Hypo-osmolality and hyponatremia: Secondary | ICD-10-CM | POA: Diagnosis present

## 2013-10-12 DIAGNOSIS — C713 Malignant neoplasm of parietal lobe: Secondary | ICD-10-CM

## 2013-10-12 LAB — GLUCOSE, CAPILLARY
Glucose-Capillary: 137 mg/dL — ABNORMAL HIGH (ref 70–99)
Glucose-Capillary: 132 mg/dL — ABNORMAL HIGH (ref 70–99)

## 2013-10-12 LAB — URINE MICROSCOPIC-ADD ON

## 2013-10-12 LAB — CBC WITH DIFFERENTIAL/PLATELET
Basophils Absolute: 0.1 10*3/uL (ref 0.0–0.1)
Basophils Relative: 1 % (ref 0–1)
Eosinophils Absolute: 0 10*3/uL (ref 0.0–0.7)
Eosinophils Relative: 0 % (ref 0–5)
HCT: 38.2 % — ABNORMAL LOW (ref 39.0–52.0)
Hemoglobin: 12.5 g/dL — ABNORMAL LOW (ref 13.0–17.0)
Lymphocytes Relative: 5 % — ABNORMAL LOW (ref 12–46)
Monocytes Relative: 3 % (ref 3–12)
Neutro Abs: 7.9 10*3/uL — ABNORMAL HIGH (ref 1.7–7.7)
Neutrophils Relative %: 91 % — ABNORMAL HIGH (ref 43–77)
RDW: 16.3 % — ABNORMAL HIGH (ref 11.5–15.5)
WBC: 8.7 10*3/uL (ref 4.0–10.5)

## 2013-10-12 LAB — COMPREHENSIVE METABOLIC PANEL
ALT: 79 U/L — ABNORMAL HIGH (ref 0–53)
AST: 19 U/L (ref 0–37)
Alkaline Phosphatase: 94 U/L (ref 39–117)
Calcium: 9.7 mg/dL (ref 8.4–10.5)
Chloride: 92 mEq/L — ABNORMAL LOW (ref 96–112)
GFR calc Af Amer: >90 mL/min (ref >90)
Glucose, Bld: 166 mg/dL — ABNORMAL HIGH (ref 70–99)
Potassium: 4.2 mEq/L (ref 3.5–5.1)
Sodium: 133 mEq/L — ABNORMAL LOW (ref 135–145)
Total Protein: 7.2 g/dL (ref 6.0–8.3)

## 2013-10-12 LAB — URINALYSIS, ROUTINE W REFLEX MICROSCOPIC
Glucose, UA: NEGATIVE mg/dL
Hgb urine dipstick: NEGATIVE
Ketones, ur: NEGATIVE mg/dL
Specific Gravity, Urine: 1.025 (ref 1.005–1.030)
Urobilinogen, UA: 1 mg/dL (ref 0.0–1.0)
pH: 6.5 (ref 5.0–8.0)

## 2013-10-12 LAB — CG4 I-STAT (LACTIC ACID): Lactic Acid, Venous: 3.27 mmol/L — ABNORMAL HIGH (ref 0.5–2.2)

## 2013-10-12 MED ORDER — FENTANYL CITRATE 0.05 MG/ML IJ SOLN: 50.0000 ug | INTRAMUSCULAR | Status: DC | PRN

## 2013-10-12 MED ORDER — SODIUM CHLORIDE 0.9 % IV SOLN
Freq: Once | INTRAVENOUS | Status: AC
  Administered 2013-10-12: 10:00:00 via INTRAVENOUS

## 2013-10-12 MED ORDER — FAMOTIDINE IN NACL 20-0.9 MG/50ML-% IV SOLN
20.0000 mg | Freq: Two times a day (BID) | INTRAVENOUS | Status: DC
  Administered 2013-10-12 – 2013-10-13 (×2): 20 mg via INTRAVENOUS
  Filled 2013-10-12 (×2): qty 50

## 2013-10-12 MED ORDER — DIPHENHYDRAMINE HCL 50 MG/ML IJ SOLN: 12.5000 mg | Freq: Four times a day (QID) | INTRAMUSCULAR | Status: DC | PRN

## 2013-10-12 MED ORDER — DIPHENHYDRAMINE HCL 12.5 MG/5ML PO ELIX: 12.5000 mg | ORAL_SOLUTION | Freq: Four times a day (QID) | ORAL | Status: DC | PRN

## 2013-10-12 MED ORDER — IOHEXOL 300 MG/ML  SOLN
50.0000 mL | Freq: Once | INTRAMUSCULAR | Status: AC | PRN
  Administered 2013-10-12: 50 mL via ORAL

## 2013-10-12 MED ORDER — LEVETIRACETAM 500 MG PO TABS
500.0000 mg | ORAL_TABLET | Freq: Every day | ORAL | Status: DC
  Administered 2013-10-12: 500 mg via ORAL
  Filled 2013-10-12 (×2): qty 1

## 2013-10-12 MED ORDER — ACETAMINOPHEN 650 MG RE SUPP: 650.0000 mg | Freq: Four times a day (QID) | RECTAL | Status: DC | PRN

## 2013-10-12 MED ORDER — DILTIAZEM HCL ER COATED BEADS 120 MG PO CP24
120.0000 mg | ORAL_CAPSULE | Freq: Every day | ORAL | Status: DC
  Administered 2013-10-13: 10:00:00 120 mg via ORAL
  Filled 2013-10-12: qty 1

## 2013-10-12 MED ORDER — FENTANYL CITRATE 0.05 MG/ML IJ SOLN
12.5500 ug | INTRAMUSCULAR | Status: DC | PRN
  Filled 2013-10-12: qty 2

## 2013-10-12 MED ORDER — INSULIN ASPART 100 UNIT/ML ~~LOC~~ SOLN: 0.0000 [IU] | Freq: Every day | SUBCUTANEOUS | Status: DC

## 2013-10-12 MED ORDER — NITROGLYCERIN 0.4 MG SL SUBL
0.4000 mg | SUBLINGUAL_TABLET | SUBLINGUAL | Status: DC | PRN
  Filled 2013-10-12: qty 25

## 2013-10-12 MED ORDER — PANTOPRAZOLE SODIUM 40 MG PO TBEC
40.0000 mg | DELAYED_RELEASE_TABLET | Freq: Every day | ORAL | Status: DC
  Administered 2013-10-12 – 2013-10-13 (×2): 40 mg via ORAL
  Filled 2013-10-12 (×2): qty 1

## 2013-10-12 MED ORDER — FLUCONAZOLE 100 MG PO TABS
100.0000 mg | ORAL_TABLET | Freq: Every day | ORAL | Status: DC
  Administered 2013-10-12 – 2013-10-13 (×2): 100 mg via ORAL
  Filled 2013-10-12 (×2): qty 1

## 2013-10-12 MED ORDER — ALPRAZOLAM 0.25 MG PO TABS: 0.2500 mg | ORAL_TABLET | Freq: Every evening | ORAL | Status: DC | PRN

## 2013-10-12 MED ORDER — POTASSIUM CHLORIDE IN NACL 20-0.9 MEQ/L-% IV SOLN
INTRAVENOUS | Status: DC
  Administered 2013-10-12 – 2013-10-13 (×3): via INTRAVENOUS
  Filled 2013-10-12 (×5): qty 1000

## 2013-10-12 MED ORDER — ERTAPENEM SODIUM 1 G IJ SOLR
1.0000 g | INTRAMUSCULAR | Status: DC
  Administered 2013-10-12 – 2013-10-23 (×12): 1 g via INTRAVENOUS
  Filled 2013-10-12 (×13): qty 1

## 2013-10-12 MED ORDER — IOHEXOL 300 MG/ML  SOLN
100.0000 mL | Freq: Once | INTRAMUSCULAR | Status: AC | PRN
  Administered 2013-10-12: 100 mL via INTRAVENOUS

## 2013-10-12 MED ORDER — INSULIN ASPART 100 UNIT/ML ~~LOC~~ SOLN
0.0000 [IU] | Freq: Three times a day (TID) | SUBCUTANEOUS | Status: DC
  Administered 2013-10-12 – 2013-10-13 (×3): 1 [IU] via SUBCUTANEOUS

## 2013-10-12 MED ORDER — CALCIUM CARBONATE ANTACID 500 MG PO CHEW
1.0000 | CHEWABLE_TABLET | Freq: Every day | ORAL | Status: DC
  Administered 2013-10-12 – 2013-11-02 (×21): 200 mg via ORAL
  Filled 2013-10-12 (×25): qty 1

## 2013-10-12 MED ORDER — ONDANSETRON HCL 4 MG/2ML IJ SOLN: 4.0000 mg | Freq: Four times a day (QID) | INTRAMUSCULAR | Status: DC | PRN

## 2013-10-12 MED ORDER — SODIUM CHLORIDE 0.9 % IV BOLUS (SEPSIS)
1000.0000 mL | Freq: Once | INTRAVENOUS | Status: AC
  Administered 2013-10-12: 1000 mL via INTRAVENOUS

## 2013-10-12 MED ORDER — HEPARIN SODIUM (PORCINE) 5000 UNIT/ML IJ SOLN
5000.0000 [IU] | Freq: Three times a day (TID) | INTRAMUSCULAR | Status: DC
  Administered 2013-10-12 – 2013-10-13 (×2): 5000 [IU] via SUBCUTANEOUS
  Filled 2013-10-12 (×5): qty 1

## 2013-10-12 MED ORDER — DEXAMETHASONE 4 MG PO TABS
4.0000 mg | ORAL_TABLET | Freq: Four times a day (QID) | ORAL | Status: DC
Start: 2013-10-12 — End: 2013-10-13
  Administered 2013-10-12 – 2013-10-13 (×2): 4 mg via ORAL
  Filled 2013-10-12 (×8): qty 1

## 2013-10-12 MED ORDER — SULFAMETHOXAZOLE-TMP DS 800-160 MG PO TABS
1.0000 | ORAL_TABLET | Freq: Every day | ORAL | Status: DC
  Administered 2013-10-12 – 2013-10-13 (×2): 1 via ORAL
  Filled 2013-10-12 (×2): qty 1

## 2013-10-12 MED ORDER — ACETAMINOPHEN 325 MG PO TABS: 650.0000 mg | ORAL_TABLET | Freq: Four times a day (QID) | ORAL | Status: DC | PRN

## 2013-10-12 MED ORDER — ONDANSETRON HCL 4 MG/2ML IJ SOLN
4.0000 mg | Freq: Once | INTRAMUSCULAR | Status: AC
  Administered 2013-10-12: 4 mg via INTRAVENOUS
  Filled 2013-10-12: qty 2

## 2013-10-12 MED ORDER — HYDROCODONE-ACETAMINOPHEN 5-325 MG PO TABS: 1.0000 | ORAL_TABLET | ORAL | Status: DC | PRN

## 2013-10-12 MED ORDER — METOPROLOL TARTRATE 25 MG PO TABS
25.0000 mg | ORAL_TABLET | Freq: Two times a day (BID) | ORAL | Status: DC
  Administered 2013-10-12 – 2013-10-13 (×3): 25 mg via ORAL
  Filled 2013-10-12 (×4): qty 1

## 2013-10-12 NOTE — ED Notes (Signed)
Called to give report, Receiving rn unable to take report at this time.

## 2013-10-12 NOTE — H&P (Addendum)
General surgery attending note:  I personally interviewed and examined this patient.  I have discussed his care with Dr. Darnelle Catalan, Dr. Welton Flakes  and with Dr. Fayrene Fearing. I've reviewed his CT scan.  He has been on steroids for 6 months since his craniotomy and has had 2 doses of  avastin  recently because presumed recurrent glioblastoma. 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 Welton Flakes may be a few months... Has abdominal pain for 3 days. He remains alert. No vomiting. A little constipated. No problems with urination.  CT scan shows some thickening and fluid a focally around the descending colon and some air in this area. This small amounts of scattered air elsewhere. There is also a thickened loop of small bowel in the central abdomen but no significant air around that.  His abdominal exam reveals it is protuberant, hypoactive bowel sounds. He is tender across the lower abdomen with mild guarding. No hernias   Assessment/plan: Perforated viscus. Suspect diverticulitis in descending colon. No abscess at this point.He does not have diffuse peritonitis at this point, but exam is compromised because of his long-term steroid use and recent Avastin use.  Hopefully this will become contained.Thickened loop of small bowel in the midabdomen is not well explained.  glioblastoma. Suspect prognosis of this is measured in just a few months.   Chronic tachycardia, on cardizem  This is a difficult surgical dilemma. Steroids and avastin use can inhibit all types of tissue healing and so it is not clear whether his perforation will seal or not. He is difficult to evaluate at the bedside because of his immune compromised state. It is also clear that if we operate on him in the setting of Avastin use, his wound healing will be terrible. He might not even heal a colostomy, much less an intra-abdominal anastomosis or a midline wound. Mortality of an exploratory  laparotomy in this setting is clearly  greater than 50%. It is likely that he may never get out of the hospital.  Because of these conflicting factors, we are going to admit him to the hospital, hold off on surgery, resuscitate him, give broad-spectrum antibiotics and bowel rest. If he deteriorates we will offer exploratory laparotomy, but the outcome will be severely guarded, at best.  This was explained to him  and his sister.    Angelia Mould. Derrell Lolling, M.D., [facility] General and Minimally invasive Surgery Breast and Colorectal Surgery Office:   [phone] Pager:   [phone]

## 2013-10-12 NOTE — ED Notes (Signed)
Unable to give report, pt bed assignment has changed.  Pt going to a tele floor.

## 2013-10-12 NOTE — ED Notes (Signed)
Patient transported to CT 

## 2013-10-12 NOTE — ED Provider Notes (Addendum)
CSN: [id number]     Arrival date & time 10/12/13  4098 History   First MD Initiated Contact with Patient 10/12/13 [phone]     Chief Complaint  Patient presents with  . Abdominal Pain   HPI  The patient has a history of glioblastoma. He was on by mouth maintenance therapy. Admitted 2 weeks ago with right-sided weakness. CT and MRI showed recurrence of his tumor with edema. Was treated IV steroids. Started on Avastin. Was discharged with recovery of his right side. Continues on by mouth Decadron and has had 2 courses of IV Avastin. He started feeling lower abdominal pain yesterday morning. He feels pain besides his lower abdomen below his umbilicus. It is rather constant but it is much worse with getting up and around. He has not had bowel movement since Monday. He did get some mag citrate 2 days ago and an enema and minimal output. He is urinating. He has some urinary hesitancy but states once a stream started he feels like he is emptying. Limited bedside ultrasound during my initial violation she shows no sign of urinary retention. No dysuria. No perineal pain to suggest prostatitis. He has never had screening colonoscopy he does not know if he has diverticuli. He has never had diverticulitis. His only known abdominal surgery as a left femoral hernia repair many years ago. His appetite is good. He's had no nausea no vomiting. He does not feel ill with fever shakes or chills. His abdomen has become progressively distended over the last several months with his steroid treatment. His weight has gone from 170/200 he notices a "puffiness" in his face and shoulders and chest and back and abdomen that he attributes to steroid use.  Past Medical History  Diagnosis Date  . Hyperlipidemia   . Right bundle branch block     Normal LV function normal aortic and mitral valve bedside echocardiogram 4 2012  . Tachycardia     Resolved  . Dyslipidemia   . Coronary artery disease (CAD) excluded     Cardiolite study  2006 within normal limits  . Hypertension   . Allergy   . Cancer    Past Surgical History  Procedure Laterality Date  . Left inguinal hernia.  2004  . Craniotomy Left 03/29/2013    Procedure: CRANIOTOMY TUMOR EXCISION;  Surgeon: Karn Cassis, MD;  Location: [facility];  Service: Neurosurgery;  Laterality: Left;  Left Craniotomy for tumor excision   Family History  Problem Relation Age of Onset  . Diverticulitis Father   . Alzheimer's disease Mother    History  Substance Use Topics  . Smoking status: Former Smoker -- 1.00 packs/day for 30 years    Types: Cigarettes    Quit date: 03/21/2005  . Smokeless tobacco: Never Used  . Alcohol Use: Yes     Comment: Occasional Brandy    Review of Systems  Constitutional: Positive for fatigue and unexpected weight change. Negative for fever, chills, diaphoresis and appetite change.  HENT: Positive for facial swelling. Negative for mouth sores, sore throat and trouble swallowing.   Eyes: Negative for visual disturbance.  Respiratory: Negative for cough, chest tightness, shortness of breath and wheezing.   Cardiovascular: Negative for chest pain.  Gastrointestinal: Positive for abdominal pain, constipation and abdominal distention. Negative for nausea, vomiting and diarrhea.  Endocrine: Negative for polydipsia, polyphagia and polyuria.  Genitourinary: Negative for dysuria, frequency and hematuria.       Hesitancy.  Musculoskeletal: Negative for gait problem.  Skin: Negative  for color change, pallor and rash.  Neurological: Negative for dizziness, syncope, light-headedness and headaches.  Hematological: Does not bruise/bleed easily.  Psychiatric/Behavioral: Negative for behavioral problems and confusion.    Allergies  Dilaudid and Morphine and related  Home Medications   Current Outpatient Rx  Name  Route  Sig  Dispense  Refill  . ALPRAZolam (XANAX) 0.25 MG tablet   Oral   Take 1 tablet (0.25 mg total) by mouth at bedtime as  needed for sleep.   30 tablet   0   . calcium carbonate (TUMS - DOSED IN MG ELEMENTAL CALCIUM) 500 MG chewable tablet   Oral   Chew 1 tablet by mouth daily. For indigestion         . dexamethasone (DECADRON) 4 MG tablet   Oral   Take 1 tablet (4 mg total) by mouth every 6 (six) hours. Take 1 pill twice daily for 2 weeks then 1/2 pill twice daily until further instructions   120 tablet   1   . diltiazem (CARDIZEM CD) 120 MG 24 hr capsule   Oral   Take 1 capsule (120 mg total) by mouth daily.   90 capsule   3   . levETIRAcetam (KEPPRA) 500 MG tablet   Oral   Take 500 mg by mouth at bedtime. Changed by Dr. Jeral Fruit 04/11/13         . metoprolol tartrate (LOPRESSOR) 25 MG tablet   Oral   Take 25 mg by mouth 2 (two) times daily.          . pantoprazole (PROTONIX) 40 MG tablet   Oral   Take 40 mg by mouth daily. take 1 tablet by mouth once daily         . sulfamethoxazole-trimethoprim (BACTRIM DS,SEPTRA DS) 800-160 MG per tablet      Take 1 pill daily   30 tablet   5   . fluconazole (DIFLUCAN) 100 MG tablet   Oral   Take 100 mg by mouth daily.          BP 142/94  Pulse 126  Temp(Src) 97.9 F (36.6 C) (Oral)  Resp 24  SpO2 95% Physical Exam  Constitutional: He is oriented to person, place, and time. He appears well-developed and well-nourished. No distress.  Awake alert. Conversant. Oriented lucid. Appears uncomfortable with movements. At rest does not look distressed  HENT:  Head: Normocephalic.  Symmetric maxilla and periorbital edema  Eyes: Conjunctivae are normal. Pupils are equal, round, and reactive to light. No scleral icterus.  No scleral icterus or pale conjunctivae.  Neck: Normal range of motion. Neck supple. No thyromegaly present.  Steroidal changes noted with the skin of the face and neck being somewhat edematous and symmetric  Cardiovascular: Regular rhythm.  Tachycardia present.  Exam reveals no gallop and no friction rub.   No murmur  heard. Resting sinus tachycardia  Pulmonary/Chest: Effort normal and breath sounds normal. No respiratory distress. He has no decreased breath sounds. He has no wheezes. He has no rales.  Symmetric breath sounds to the bases without diminished breath sounds are focal changes  Abdominal: Soft. Bowel sounds are normal. He exhibits no distension. There is no tenderness. There is no rebound.  Distended. No tenderness to the upper abdomen. Bilateral lower abdominal tenderness right greater than left.  He just urinated prior to my exam. Bedside ultrasound shows no distention of the urinary bladder  Musculoskeletal: Normal range of motion.  And lower extremities. No asymmetry. No edema. No  cording or sign of DVT.  Neurological: He is alert and oriented to person, place, and time.  Skin: Skin is warm and dry. No rash noted.  Psychiatric: He has a normal mood and affect. His behavior is normal.    ED Course  Procedures (including critical care time) Labs Review Labs Reviewed  CBC WITH DIFFERENTIAL - Abnormal; Notable for the following:    RBC 3.86 (*)    Hemoglobin 12.5 (*)    HCT 38.2 (*)    RDW 16.3 (*)    Neutrophils Relative % 91 (*)    Lymphocytes Relative 5 (*)    Neutro Abs 7.9 (*)    Lymphs Abs 0.4 (*)    All other components within normal limits  COMPREHENSIVE METABOLIC PANEL - Abnormal; Notable for the following:    Sodium 133 (*)    Chloride 92 (*)    Glucose, Bld 166 (*)    Albumin 2.9 (*)    ALT 79 (*)    All other components within normal limits  URINALYSIS, ROUTINE W REFLEX MICROSCOPIC - Abnormal; Notable for the following:    Color, Urine AMBER (*)    APPearance CLOUDY (*)    Bilirubin Urine SMALL (*)    Protein, ur 30 (*)    All other components within normal limits  CG4 I-STAT (LACTIC ACID) - Abnormal; Notable for the following:    Lactic Acid, Venous 3.27 (*)    All other components within normal limits  LIPASE, BLOOD  URINE MICROSCOPIC-ADD ON   Imaging  Review Ct Abdomen Pelvis W Contrast  10/12/2013   CLINICAL DATA:  Severe abdominal pain. History of CNS glioblastoma.  EXAM: CT ABDOMEN AND PELVIS WITH CONTRAST  TECHNIQUE: Multidetector CT imaging of the abdomen and pelvis was performed using the standard protocol following bolus administration of intravenous contrast.  CONTRAST:  OMNIPAQUE IOHEXOL 300 MG/ML  SOLN  COMPARISON:  10/11/2009  FINDINGS: Linear densities at the right lung base are suggestive for atelectasis. There is free intraperitoneal air in the left upper quadrant with air around the distal esophagus. There appears to be some air within the retroperitoneal space. The largest focus of air is around the descending colon and there is fluid or inflammation in this area. The fluid collection around the descending colon roughly measures 5.2 x 3.7 cm and could represent a site for abscess formation. Findings raise concern for acute diverticulitis in this area. Patient has extensive colonic diverticula. In addition, there is a loop of small bowel in the mid lower abdomen that demonstrates severe wall thickening measuring up to 1.1 cm. There is fluid and edema tracking from this abnormal loop of bowel into the mesentery above it. There is some fluid and small nodes throughout the central mesentery and adjacent to the duodenum.  No gross abnormality to the liver, gallbladder or portal venous system. No gross abnormality of the pancreas, spleen, adrenal glands. There is mild perinephric stranding or edema around the kidneys which appears chronic. Probable bilateral renal cysts. Calcification in the left kidney lower pole measures 4 mm and suggestive for nonobstructive stone.  No gross abnormality to the prostate, seminal vesicles or urinary bladder. No significant free fluid in the pelvis. No acute bone abnormality.  IMPRESSION: Study is positive for free air and suggestive for a bowel perforation. The largest focus of air is surrounding the  descending colon. Patient has multiple colonic diverticula and findings could represent acute diverticulitis. There is fluid around the descending colon which may represent  a potential site for abscess formation.  There is a severely thickened loop of small bowel in the mid abdomen. There is extensive edema and fluid tracking superior to this abnormal loop of bowel and towards the central mesentery. The patient is currently receiving chemotherapy and Avastin. Findings could represent a focal area of bowel ischemia from vasculitis. Findings may also associated with an infectious or inflammatory process.  Nonobstructive left kidney stone.  Renal cysts.  These results were called by telephone at the time of interpretation on 10/12/2013 at 12:27 PM to Dr. Rolland Porter , who verbally acknowledged these results.   Electronically Signed   By: Richarda Overlie M.D.   On: 10/12/2013 12:30    EKG Interpretation   None       MDM   1. Perforated viscus   2. Glioblastoma     Differential diagnosis is broad. Complaining CT scan as an etiology is not readily apparent with history and exam. His immune system is compromised with a past and chemotherapy and daily Decadron. CBC to evaluate his Neutrophil levels.Girtha Rm of the studies are pending. IV is being placed for pain control hydration.   CT scan shows perforated viscus with moderate volume pneumoperitoneum small bowel inflammation and peri-colonic fluid. I discussed the case with Dr. Derrell Lolling of general surgery.  He will see the patient in consultation.  Also discussed the patient with Dr. Darnelle Catalan of [facility] regarding admission.     Roney Marion, MD 10/12/13 1014  Roney Marion, MD 10/12/13 1355  Roney Marion, MD 10/12/13 1404  Roney Marion, MD 10/12/13 [phone]

## 2013-10-12 NOTE — H&P (Deleted)
[facility]   [name] ZOX:[id number] DOB: 01-24-1956 DOA: 10/12/2013   Referring physician: Dr. Rolland Martin PCP: Martin Heap, MD   Oncologist: Dr. Drue Martin   Chief Complaint: Abdominal pain  History of Present Illness: Martin Martin is an 57 y.o. male with a PMH of glioblastoma of the left parietal brain, status post biopsy/debulking in April 2014 followed by radiation and adjuvant chemotherapy consisting of Temodar, currently being managed with Avastin every 2 weeks. He subsequently was started on steroids secondary to progression of disease with right-sided weakness in September 2014.  The patient presented to the ER with a 2-3 day history of abdominal pain and abdominal bloating.  Has not had a BM in 2 days despite treatment with magnesium citrate and only minimal results with a Fleets enema.   Review of Systems: Constitutional: No fever, no chills;  Appetite normal; No weight loss, + 30 pound weight gain in past few months, no fatigue.  HEENT: No blurry vision, no diplopia, no pharyngitis, no dysphagia CV: No chest pain, no palpitations, no PND.  Resp: + SOB, no cough, no pleuritic pain. GI: No nausea, no vomiting, no diarrhea, no melena, no hematochezia, + constipation.  GU: No dysuria, no hematuria, no frequency, no urgency. MSK: no myalgias, no arthralgias.  Neuro:  No headache, + right sided weakness, + history of focal seizure, on Keppra.  Psych: No depression, no anxiety.  Endo: No heat intolerance, no cold intolerance, no polyuria, no polydipsia  Skin: No rashes, no skin lesions.  Heme: + easy bruising.  Travel history: None in past 6 months.   Past Medical History Past Medical History   Diagnosis  Date   .  Hyperlipidemia     .  Right bundle branch block         Normal LV function normal aortic and mitral valve bedside echocardiogram 4 2012   .  Tachycardia         Resolved   .  Dyslipidemia     .  Coronary artery disease (CAD)  excluded         Cardiolite study 2006 within normal limits   .  Hypertension     .  Allergy     .  Cancer          Past Surgical History Past Surgical History   Procedure  Laterality  Date   .  Left inguinal hernia.    2004   .  Craniotomy  Left  03/29/2013       Procedure: CRANIOTOMY TUMOR EXCISION;  Surgeon: Karn Cassis, MD;  Location: [facility];  Service: Neurosurgery;  Laterality: Left;  Left Craniotomy for tumor excision        Social History: History       Social History   .  Marital Status:  Married       Spouse Name:  Martin Martin       Number of Children:  N/A   .  Years of Education:  N/A       Occupational History   .    Other       FT @[employer]       Social History Main Topics   .  Smoking status:  Former Smoker -- 1.00 packs/day for 30 years       Types:  Cigarettes       Quit date:  03/21/2005   .  Smokeless tobacco:  Never Used   .  Alcohol Use:  Yes         Comment: Occasional Brandy   .  Drug Use:  No   .  Sexual Activity:  Not Currently       Other Topics  Concern   .  Not on file       Social History Narrative   .  No narrative on file      Family History:  Family History   Problem  Relation  Age of Onset   .  Diverticulitis  Father     .  Alzheimer's disease  Mother        Allergies: Dilaudid and Morphine and related   Meds: Prior to Admission medications    Medication  Sig  Start Date  End Date  Taking?  Authorizing Provider   ALPRAZolam (XANAX) 0.25 MG tablet  Take 1 tablet (0.25 mg total) by mouth at bedtime as needed for sleep.  04/24/13    Yes  Victorino December, MD   calcium carbonate (TUMS - DOSED IN MG ELEMENTAL CALCIUM) 500 MG chewable tablet  Chew 1 tablet by mouth daily. For indigestion      Yes  Historical Provider, MD   dexamethasone (DECADRON) 4 MG tablet  Take 1 tablet (4 mg total) by mouth every 6 (six) hours. Take 1 pill twice daily for 2 weeks then 1/2 pill twice daily until further  instructions  09/11/13    Yes  Alison Murray, MD   diltiazem (CARDIZEM CD) 120 MG 24 hr capsule  Take 1 capsule (120 mg total) by mouth daily.  03/04/12    Yes  June Leap, MD   levETIRAcetam (KEPPRA) 500 MG tablet  Take 500 mg by mouth at bedtime. Changed by Dr. Jeral Fruit 04/11/13  04/02/13    Yes  Barnett Abu, MD   metoprolol tartrate (LOPRESSOR) 25 MG tablet  Take 25 mg by mouth 2 (two) times daily.   03/20/13    Yes  Historical Provider, MD   pantoprazole (PROTONIX) 40 MG tablet  Take 40 mg by mouth daily. take 1 tablet by mouth once daily  07/28/13    Yes  Victorino December, MD   sulfamethoxazole-trimethoprim (BACTRIM DS,SEPTRA DS) 800-160 MG per tablet  Take 1 pill daily  04/24/13    Yes  Victorino December, MD   fluconazole (DIFLUCAN) 100 MG tablet  Take 100 mg by mouth daily.        Historical Provider, MD        Physical Exam: Filed Vitals:     10/12/13 0913  10/12/13 0922   BP:    142/94   Pulse:  126     Temp:  97.9 F (36.6 C)     TempSrc:  Oral     Resp:  24     SpO2:  95%          Physical Exam: Blood pressure 142/94, pulse 126, temperature 97.9 F (36.6 C), temperature source Oral, resp. rate 24, SpO2 95.00%. Gen: No acute distress. Head: Normocephalic, atraumatic. Eyes: PERRL, EOMI, sclerae nonicteric. Mouth: Oropharynx + thrush.    Neck: Supple, no thyromegaly, no lymphadenopathy, no jugular venous distention. Chest: Lungs CTAB. CV: Heart sounds are tachycardic, regular. Abdomen: Distended, tender lower quadrants/umbilicus, no guarding or rebound. Extremities: Extremities with bruising, petechiae, no edema. Skin: Warm and dry. Neuro: Alert and oriented times 3; cranial nerves II through XII grossly intact. Psych: Mood and affect normal.   Labs on Admission:  Basic Metabolic Panel: Recent Labs Lab  10/09/13 1300  10/12/13 1030   NA  141  133*   K  4.4  4.2   CL   --   92*   CO2  21*  25   GLUCOSE  181*  166*   BUN  19.6  17   CREATININE  0.7  0.83   CALCIUM   9.3  9.7    Liver Function Tests: Recent Labs Lab  10/09/13 1300  10/12/13 1030   AST  30  19   ALT  124*  79*   ALKPHOS  89  94   BILITOT  0.35  0.3   PROT  6.9  7.2   ALBUMIN  2.6*  2.9*    Recent Labs Lab  10/12/13 1030   LIPASE  39    CBC: Recent Labs Lab  10/09/13 1300  10/12/13 1030   WBC  9.7  8.7   NEUTROABS  8.6*  7.9*   HGB  12.7*  12.5*   HCT  37.6*  38.2*   MCV  98.2*  99.0   PLT  232  233    Radiological Exams on Admission: Ct Abdomen Pelvis W Contrast   10/12/2013   CLINICAL DATA:  Severe abdominal pain. History of CNS glioblastoma.  EXAM: CT ABDOMEN AND PELVIS WITH CONTRAST  TECHNIQUE: Multidetector CT imaging of the abdomen and pelvis was performed using the standard protocol following bolus administration of intravenous contrast.  CONTRAST:  OMNIPAQUE IOHEXOL 300 MG/ML  SOLN  COMPARISON:  10/11/2009  FINDINGS: Linear densities at the right lung base are suggestive for atelectasis. There is free intraperitoneal air in the left upper quadrant with air around the distal esophagus. There appears to be some air within the retroperitoneal space. The largest focus of air is around the descending colon and there is fluid or inflammation in this area. The fluid collection around the descending colon roughly measures 5.2 x 3.7 cm and could represent a site for abscess formation. Findings raise concern for acute diverticulitis in this area. Patient has extensive colonic diverticula. In addition, there is a loop of small bowel in the mid lower abdomen that demonstrates severe wall thickening measuring up to 1.1 cm. There is fluid and edema tracking from this abnormal loop of bowel into the mesentery above it. There is some fluid and small nodes throughout the central mesentery and adjacent to the duodenum.  No gross abnormality to the liver, gallbladder or portal venous system. No gross abnormality of the pancreas, spleen, adrenal glands. There is mild perinephric  stranding or edema around the kidneys which appears chronic. Probable bilateral renal cysts. Calcification in the left kidney lower pole measures 4 mm and suggestive for nonobstructive stone.  No gross abnormality to the prostate, seminal vesicles or urinary bladder. No significant free fluid in the pelvis. No acute bone abnormality.  IMPRESSION: Study is positive for free air and suggestive for a bowel perforation. The largest focus of air is surrounding the descending colon. Patient has multiple colonic diverticula and findings could represent acute diverticulitis. There is fluid around the descending colon which may represent a potential site for abscess formation.  There is a severely thickened loop of small bowel in the mid abdomen. There is extensive edema and fluid tracking superior to this abnormal loop of bowel and towards the central mesentery. The patient is currently receiving chemotherapy and Avastin. Findings could represent a focal area of bowel ischemia from vasculitis. Findings may  also associated with an infectious or inflammatory process.  Nonobstructive left kidney stone.  Renal cysts.  These results were called by telephone at the time of interpretation on 10/12/2013 at 12:27 PM to Dr. Rolland Martin , who verbally acknowledged these results.   Electronically Signed   By: Richarda Overlie M.D.   On: 10/12/2013 12:30     Assessment/Plan Principal Problem:   Diverticulitis of colon with perforation -The patient's last dose of Avastin was given  10/09/2013. This medication (along with chronic steroids) can cause problems with wound healing. If possible, use percutaneous drainage but if not, please be aware of problems with delayed wound healing. -Recommend empiric antibiotics per surgeon's preference (Invanz). Active Problems:   Glioblastoma with residual right sided weakness -Spoke with the patient's oncologist who recommends continuing steroids as the patient develops significant vasogenic edema  with attempts to wean his dose. -Continue Keppra for seizure prophylaxis. -Continue Bactrim given recent treatment with Temodar (which can be associated with opportunistic infections such as PCP).   Hyperglycemia -Would place on sliding scale insulin.   Thrush -Would continue Diflucan.   Time spent: 70 minutes.   RAMA,CHRISTINA [facility] Pager [phone]   If 7PM-7AM, please contact night-coverage [email or web address] Password [email or web address] 10/12/2013, 2:10 PM           Revision History...     Date/Time User Action   10/12/2013 2:31 PM Maryruth Bun Rama, MD Addend   10/12/2013 2:29 PM Maryruth Bun Rama, MD Addend   10/12/2013 2:26 PM Maryruth Bun Rama, MD Sign  View Details Report   Routing History...     Date/Time From To Method   10/12/2013 2:31 PM Maryruth Bun Rama, MD Ernestina Penna, MD In Basket

## 2013-10-12 NOTE — H&P (Signed)
Martin Martin is an 57 y.o. male.   Chief Complaint:  Worsening abdominal pain for the last 2-3 days. Pain is on both the left and right sides below the umbilicus. No fever, nausea, vomiting, + constipation since chemotherapy on 10/20.  Minimal results with Magnesium citrate and Fleets enema last PM. HPI: This is a 57 y/o who is undergoing chemo and radiation therapy for a recurrent glioblastoma.  He was seen last month and found to have a left parietal mass lesion showing progression in the wall of the mass with central necrosis, on MRI. His last treatment with Avastin was 10/09/13.  Since that time he started having abdominal pain, which has become progressively worse.  He say it hurts to move around.  He presented to the ER with findings on CT showing free air LUQ, and descending colon.  Multiple colonic diverticula, small bowel loops with thickening  And a fluid collection 5.2 x 3.7 cm.  Primary consideration is a diverticular perforation.  Labs are borderline normal, glucose is up some, Lactic acid is up some, WBC is normal.  He has been on steroids since his surgery in April.  No history of seizure. No prior hx of colonoscopy.   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 Jeral Fruit.   Radiation and Chemotherapy;  last chemo 3 days ago. New right sided weakness with new Mass on MRI 09/08/13  . Right bundle branch block     Normal LV function normal aortic and mitral valve bedside echocardiogram 4 2012  . Tachycardia     Resolved on cardizem  . Dyslipidemia   . Coronary artery disease (CAD) excluded     Cardiolite study 2006 within normal limits  . Hypertension   . Chronic steroid use since craniotomy 03/2013.    Hx of tobacco use  25 years (1-1.5 PPD) none since age 10    . Hyperlipidemia      Oral candidiasis      Past Surgical History  Procedure Laterality Date  . Left inguinal hernia.  2004  .  Craniotomy Left 03/29/2013    Procedure: CRANIOTOMY TUMOR EXCISION;  Surgeon: Karn Cassis, MD;  Location: [facility];  Service: Neurosurgery;  Laterality: Left;  Left Craniotomy for tumor excision   Prior to Admission medications   Medication Sig Start Date End Date Taking? Authorizing Provider  ALPRAZolam (XANAX) 0.25 MG tablet Take 1 tablet (0.25 mg total) by mouth at bedtime as needed for sleep. 04/24/13  Yes Victorino December, MD  calcium carbonate (TUMS - DOSED IN MG ELEMENTAL CALCIUM) 500 MG chewable tablet Chew 1 tablet by mouth daily. For indigestion   Yes Historical Provider, MD  dexamethasone (DECADRON) 4 MG tablet Take 1 tablet (4 mg total) by mouth every 6 (six) hours. Take 1 pill twice daily for 2 weeks then 1/2 pill twice daily until further instructions 09/11/13  Yes Alison Murray, MD  diltiazem (CARDIZEM CD) 120 MG 24 hr capsule Take 1 capsule (120 mg total) by mouth daily. 03/04/12  Yes June Leap, MD  levETIRAcetam (KEPPRA) 500 MG tablet Take 500 mg by mouth at bedtime. Changed by Dr. Jeral Fruit 04/11/13 04/02/13  Yes Barnett Abu, MD  metoprolol tartrate (LOPRESSOR) 25 MG tablet Take 25 mg by mouth 2 (two) times daily.  03/20/13  Yes Historical Provider, MD  pantoprazole (PROTONIX) 40 MG tablet Take 40 mg by mouth daily.  take 1 tablet by mouth once daily 07/28/13  Yes Victorino December, MD  sulfamethoxazole-trimethoprim (BACTRIM DS,SEPTRA DS) 800-160 MG per tablet Take 1 pill daily 04/24/13  Yes Victorino December, MD  fluconazole (DIFLUCAN) 100 MG tablet Take 100 mg by mouth daily.    Historical Provider, MD   Prior to Admission medications   Medication Sig Start Date End Date Taking? Authorizing Provider  ALPRAZolam (XANAX) 0.25 MG tablet Take 1 tablet (0.25 mg total) by mouth at bedtime as needed for sleep. 04/24/13  Yes Victorino December, MD  calcium carbonate (TUMS - DOSED IN MG ELEMENTAL CALCIUM) 500 MG chewable tablet Chew 1 tablet by mouth daily. For indigestion   Yes Historical Provider, MD   dexamethasone (DECADRON) 4 MG tablet Take 1 tablet (4 mg total) by mouth every 6 (six) hours. Take 1 pill twice daily for 2 weeks then 1/2 pill twice daily until further instructions 09/11/13  Yes Alison Murray, MD  diltiazem (CARDIZEM CD) 120 MG 24 hr capsule Take 1 capsule (120 mg total) by mouth daily. 03/04/12  Yes June Leap, MD  levETIRAcetam (KEPPRA) 500 MG tablet Take 500 mg by mouth at bedtime. Changed by Dr. Jeral Fruit 04/11/13 04/02/13  Yes Barnett Abu, MD  metoprolol tartrate (LOPRESSOR) 25 MG tablet Take 25 mg by mouth 2 (two) times daily.  03/20/13  Yes Historical Provider, MD  pantoprazole (PROTONIX) 40 MG tablet Take 40 mg by mouth daily. take 1 tablet by mouth once daily 07/28/13  Yes Victorino December, MD  sulfamethoxazole-trimethoprim (BACTRIM DS,SEPTRA DS) 800-160 MG per tablet Take 1 pill daily 04/24/13  Yes Victorino December, MD  fluconazole (DIFLUCAN) 100 MG tablet Take 100 mg by mouth daily.    Historical Provider, MD    Family History  Problem Relation Age of Onset  . Diverticulitis Father   . Alzheimer's disease Mother    Social History:  reports that he quit smoking about 8 years ago. His smoking use included Cigarettes. He has a 30 pack-year smoking history. He has never used smokeless tobacco. He reports that he drinks alcohol. He reports that he does not use illicit drugs.  Allergies:  Allergies  Allergen Reactions  . Dilaudid [Hydromorphone Hcl] Nausea And Vomiting  . Morphine And Related Nausea And Vomiting     (Not in a hospital admission)  Results for orders placed during the hospital encounter of 10/12/13 (from the past 48 hour(s))  CBC WITH DIFFERENTIAL     Status: Abnormal   Collection Time    10/12/13 10:30 AM      Result Value Range   WBC 8.7  4.0 - 10.5 K/uL   RBC 3.86 (*) 4.22 - 5.81 MIL/uL   Hemoglobin 12.5 (*) 13.0 - 17.0 g/dL   HCT 16.1 (*) 09.6 - 04.5 %   MCV 99.0  78.0 - 100.0 fL   MCH 32.4  26.0 - 34.0 pg   MCHC 32.7  30.0 - 36.0 g/dL   RDW  40.9 (*) 81.1 - 15.5 %   Platelets 233  150 - 400 K/uL   Neutrophils Relative % 91 (*) 43 - 77 %   Lymphocytes Relative 5 (*) 12 - 46 %   Monocytes Relative 3  3 - 12 %   Eosinophils Relative 0  0 - 5 %   Basophils Relative 1  0 - 1 %   Neutro Abs 7.9 (*) 1.7 - 7.7 K/uL   Lymphs Abs 0.4 (*) 0.7 - 4.0  K/uL   Monocytes Absolute 0.3  0.1 - 1.0 K/uL   Eosinophils Absolute 0.0  0.0 - 0.7 K/uL   Basophils Absolute 0.1  0.0 - 0.1 K/uL   RBC Morphology RARE NRBCs     WBC Morphology MILD LEFT SHIFT (1-5% METAS, OCC MYELO, OCC BANDS)     Comment: DOHLE BODIES     VACUOLATED NEUTROPHILS  COMPREHENSIVE METABOLIC PANEL     Status: Abnormal   Collection Time    10/12/13 10:30 AM      Result Value Range   Sodium 133 (*) 135 - 145 mEq/L   Potassium 4.2  3.5 - 5.1 mEq/L   Chloride 92 (*) 96 - 112 mEq/L   CO2 25  19 - 32 mEq/L   Glucose, Bld 166 (*) 70 - 99 mg/dL   BUN 17  6 - 23 mg/dL   Creatinine, Ser 1.61  0.50 - 1.35 mg/dL   Calcium 9.7  8.4 - 09.6 mg/dL   Total Protein 7.2  6.0 - 8.3 g/dL   Albumin 2.9 (*) 3.5 - 5.2 g/dL   AST 19  0 - 37 U/L   ALT 79 (*) 0 - 53 U/L   Alkaline Phosphatase 94  39 - 117 U/L   Total Bilirubin 0.3  0.3 - 1.2 mg/dL   GFR calc non Af Amer >90  >90 mL/min   GFR calc Af Amer >90  >90 mL/min   Comment: (NOTE)     The eGFR has been calculated using the CKD EPI equation.     This calculation has not been validated in all clinical situations.     eGFR's persistently <90 mL/min signify possible Chronic Kidney     Disease.  LIPASE, BLOOD     Status: None   Collection Time    10/12/13 10:30 AM      Result Value Range   Lipase 39  11 - 59 U/L  CG4 I-STAT (LACTIC ACID)     Status: Abnormal   Collection Time    10/12/13 10:48 AM      Result Value Range   Lactic Acid, Venous 3.27 (*) 0.5 - 2.2 mmol/L  URINALYSIS, ROUTINE W REFLEX MICROSCOPIC     Status: Abnormal   Collection Time    10/12/13 11:30 AM      Result Value Range   Color, Urine AMBER (*) YELLOW    Comment: BIOCHEMICALS MAY BE AFFECTED BY COLOR   APPearance CLOUDY (*) CLEAR   Specific Gravity, Urine 1.025  1.005 - 1.030   pH 6.5  5.0 - 8.0   Glucose, UA NEGATIVE  NEGATIVE mg/dL   Hgb urine dipstick NEGATIVE  NEGATIVE   Bilirubin Urine SMALL (*) NEGATIVE   Ketones, ur NEGATIVE  NEGATIVE mg/dL   Protein, ur 30 (*) NEGATIVE mg/dL   Urobilinogen, UA 1.0  0.0 - 1.0 mg/dL   Nitrite NEGATIVE  NEGATIVE   Leukocytes, UA NEGATIVE  NEGATIVE  URINE MICROSCOPIC-ADD ON     Status: None   Collection Time    10/12/13 11:30 AM      Result Value Range   Squamous Epithelial / LPF RARE  RARE   WBC, UA 0-2  <3 WBC/hpf   RBC / HPF 0-2  <3 RBC/hpf   Bacteria, UA RARE  RARE   Urine-Other MUCOUS PRESENT     Ct Abdomen Pelvis W Contrast  10/12/2013   CLINICAL DATA:  Severe abdominal pain. History of CNS glioblastoma.  EXAM: CT ABDOMEN AND PELVIS WITH CONTRAST  TECHNIQUE: Multidetector CT imaging of the abdomen and pelvis was performed using the standard protocol following bolus administration of intravenous contrast.  CONTRAST:  OMNIPAQUE IOHEXOL 300 MG/ML  SOLN  COMPARISON:  10/11/2009  FINDINGS: Linear densities at the right lung base are suggestive for atelectasis. There is free intraperitoneal air in the left upper quadrant with air around the distal esophagus. There appears to be some air within the retroperitoneal space. The largest focus of air is around the descending colon and there is fluid or inflammation in this area. The fluid collection around the descending colon roughly measures 5.2 x 3.7 cm and could represent a site for abscess formation. Findings raise concern for acute diverticulitis in this area. Patient has extensive colonic diverticula. In addition, there is a loop of small bowel in the mid lower abdomen that demonstrates severe wall thickening measuring up to 1.1 cm. There is fluid and edema tracking from this abnormal loop of bowel into the mesentery above it. There is some fluid  and small nodes throughout the central mesentery and adjacent to the duodenum.  No gross abnormality to the liver, gallbladder or portal venous system. No gross abnormality of the pancreas, spleen, adrenal glands. There is mild perinephric stranding or edema around the kidneys which appears chronic. Probable bilateral renal cysts. Calcification in the left kidney lower pole measures 4 mm and suggestive for nonobstructive stone.  No gross abnormality to the prostate, seminal vesicles or urinary bladder. No significant free fluid in the pelvis. No acute bone abnormality.  IMPRESSION: Study is positive for free air and suggestive for a bowel perforation. The largest focus of air is surrounding the descending colon. Patient has multiple colonic diverticula and findings could represent acute diverticulitis. There is fluid around the descending colon which may represent a potential site for abscess formation.  There is a severely thickened loop of small bowel in the mid abdomen. There is extensive edema and fluid tracking superior to this abnormal loop of bowel and towards the central mesentery. The patient is currently receiving chemotherapy and Avastin. Findings could represent a focal area of bowel ischemia from vasculitis. Findings may also associated with an infectious or inflammatory process.  Nonobstructive left kidney stone.  Renal cysts.  These results were called by telephone at the time of interpretation on 10/12/2013 at 12:27 PM to Dr. Rolland Porter , who verbally acknowledged these results.   Electronically Signed   By: Richarda Overlie M.D.   On: 10/12/2013 12:30    Review of Systems  Constitutional: Negative for fever, chills, weight loss, malaise/fatigue and diaphoresis.       He has gained 25 pounds since they started chemotherapy.  HENT: Negative.   Eyes: Negative.   Respiratory: Positive for shortness of breath (bending over, NO DOE, limited activity, he can walk around the store and parking lot without  difficulty). Negative for cough, hemoptysis, sputum production and wheezing.   Cardiovascular: Positive for palpitations (he had a fast heart rate and was placed on medicine.) and orthopnea (he says his stomach is so big it's hard to bend over or lay flat.). Negative for claudication, leg swelling and PND.  Gastrointestinal: Positive for constipation (2 days worth, corresponding to his last chemotherapy.  He had 1/2 bottle of mag citrate, and fleets enema last pm with very little affect.). Negative for heartburn, nausea, vomiting, abdominal pain, diarrhea, blood in stool and melena.  Genitourinary: Negative.   Musculoskeletal: Negative.   Skin:       Skin  is thinner and easy brusing since he started chemotherapy.  Neurological: Negative.  Negative for weakness.  Endo/Heme/Allergies: Negative for environmental allergies and polydipsia. Bruises/bleeds easily.  Psychiatric/Behavioral: Positive for depression (minimal).    Blood pressure 142/94, pulse 126, temperature 97.9 F (36.6 C), temperature source Oral, resp. rate 24, SpO2 95.00%. Physical Exam  Constitutional: He is oriented to person, place, and time. He appears well-developed and well-nourished. No distress.  HENT:  Head: Normocephalic and atraumatic.  Nose: Nose normal.  He has some white patches on his tongue and has been on magic mouth wash for this at home.  Eyes: Conjunctivae and EOM are normal. Pupils are equal, round, and reactive to light. Right eye exhibits no discharge. Left eye exhibits no discharge. No scleral icterus.  Neck: Normal range of motion. Neck supple. No JVD present. No tracheal deviation present. No thyromegaly present.  Cardiovascular: Regular rhythm, normal heart sounds and intact distal pulses.  Exam reveals no gallop.   No murmur heard. Sinus tachycardia HR up in the 110-120 range at rest.  Respiratory: Effort normal and breath sounds normal. No respiratory distress. He has no wheezes. He has no rales. He  exhibits no tenderness.  GI: He exhibits no mass. Distention: Very distended and he stays that way. There is tenderness (RLQ and LLQ from the umbilicus down,  one side is not more tender than the other.). There is no rebound and no guarding.  Distended abdomen, BS hypoactive. No peritonitis  Musculoskeletal: He exhibits no edema and no tenderness.  Lymphadenopathy:    He has no cervical adenopathy.  Neurological: He is alert and oriented to person, place, and time. No cranial nerve deficit.  Skin: Skin is warm and dry. No rash noted. He is not diaphoretic. No erythema. No pallor.  He has allot of brusing on his arms.  Psychiatric: He has a normal mood and affect. His behavior is normal. Judgment and thought content normal.     Assessment/Plan IMP:  1.   Diverticulosis with perforation and probable abscess 2.  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 Jeral Fruit.       Chemotherapy 10/09/13. 3.  Right bundle branch block        Normal LV function normal aortic and mitral valve bedside echocardiogram 4 2012  4.  Tachycardia        Resolved on cardizem  5.   Dyslipidemia  6.   Coronary artery disease (CAD) excluded        Cardiolite study 2006 within normal limits  7.  Hypertension 8.  Hx of tobacco use 25 years (1-1.5 PPD) none since age 35  9.  Hyperlipidemia   Plan:  I will admit and Dr. Darnelle Catalan from Medicine has seen and will help with medical management.  Bowel rest, IV antibiotics, I will check coagulation studies in anticipation of IR procedure for the fluid collection.  Hydrate, and keep NPO for now.   Dr Park Breed and Dr. Kathry[facility]   PCP:  [name], MD  Sherrie George 10/12/2013, 2:14 PM

## 2013-10-12 NOTE — H&P (Signed)
[facility] History and Physical  Martin Martin ZOX:[id number] DOB: Jul 19, 1956 DOA: 10/12/2013  Referring physician: Dr. Rolland Porter PCP: [name], MD   Chief Complaint: Abdominal pain   History of Present Illness: Martin Martin is an 57 y.o. male male with a PMH of glioblastoma of the left parietal brain, status post biopsy/debulking in April 2014 followed by radiation and adjuvant chemotherapy consisting of Temodar, currently being managed with Avastin every 2 weeks. He subsequently was started on steroids secondary to progression of disease with right-sided weakness in September 2014. The patient presented to the ER with a 2-3 day history of abdominal pain and abdominal bloating. Has not had a BM in 2 days despite treatment with magnesium citrate and only minimal results with a Fleets enema.  Review of Systems: Constitutional: No fever, no chills; Appetite normal; No weight loss, + 30 pound weight gain in past few months, no fatigue. HEENT: No blurry vision, no diplopia, no pharyngitis, no dysphagia CV: No chest pain, no palpitations, no PND. Resp: + SOB, no cough, no pleuritic pain. GI: No nausea, no vomiting, no diarrhea, no melena, no hematochezia, + constipation. GU: No dysuria, no hematuria, no frequency, no urgency. MSK: no myalgias, no arthralgias. Neuro: No headache, + right sided weakness, + history of focal seizure, on Keppra. Psych: No depression, no anxiety. Endo: No heat intolerance, no cold intolerance, no polyuria, no polydipsia Skin: No rashes, no skin lesions. Heme: + easy bruising. Travel history: None in past 6 months.      Past Medical History Past Medical History  Diagnosis Date  . Hyperlipidemia   . Right bundle branch block     Normal LV function normal aortic and mitral valve bedside echocardiogram 4 2012  . Tachycardia     Resolved  . Dyslipidemia   . Coronary artery disease (CAD) excluded     Cardiolite study 2006 within normal limits  .  Hypertension   . Allergy   . Cancer      Past Surgical History Past Surgical History  Procedure Laterality Date  . Left inguinal hernia.  2004  . Craniotomy Left 03/29/2013    Procedure: CRANIOTOMY TUMOR EXCISION;  Surgeon: Karn Cassis, MD;  Location: [facility];  Service: Neurosurgery;  Laterality: Left;  Left Craniotomy for tumor excision     Social History: History   Social History  . Marital Status: Married    Spouse Name: Rabold,[name]    Number of Children: N/A  . Years of Education: N/A   Occupational History  .  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 Smoking status: Former Smoker -- 1.00 packs/day for 30 years    Types: Cigarettes    Quit date: 03/21/2005  . Smokeless tobacco: Never Used  . Alcohol Use: Yes     Comment: Occasional Brandy  . Drug Use: No  . Sexual Activity: Not Currently   Other Topics Concern  . Not on file   Social History Narrative  . No narrative on file    Family History:  Family History  Problem Relation Age of Onset  . Diverticulitis Father   . Alzheimer's disease Mother     Allergies: Dilaudid and Morphine and related  Meds: Prior to Admission medications   Medication Sig Start Date End Date Taking? Authorizing Provider  ALPRAZolam (XANAX) 0.25 MG tablet Take 1 tablet (0.25 mg total) by mouth at bedtime as needed for sleep. 04/24/13  Yes Kalsoom K  Welton Flakes, MD  calcium carbonate (TUMS - DOSED IN MG ELEMENTAL CALCIUM) 500 MG chewable tablet Chew 1 tablet by mouth daily. For indigestion   Yes Historical Provider, MD  dexamethasone (DECADRON) 4 MG tablet Take 1 tablet (4 mg total) by mouth every 6 (six) hours. Take 1 pill twice daily for 2 weeks then 1/2 pill twice daily until further instructions 09/11/13  Yes Alison Murray, MD  diltiazem (CARDIZEM CD) 120 MG 24 hr capsule Take 1 capsule (120 mg total) by mouth daily. 03/04/12  Yes June Leap, MD  levETIRAcetam (KEPPRA) 500 MG tablet Take 500 mg by  mouth at bedtime. Changed by Dr. Jeral Fruit 04/11/13 04/02/13  Yes Barnett Abu, MD  metoprolol tartrate (LOPRESSOR) 25 MG tablet Take 25 mg by mouth 2 (two) times daily.  03/20/13  Yes Historical Provider, MD  pantoprazole (PROTONIX) 40 MG tablet Take 40 mg by mouth daily. take 1 tablet by mouth once daily 07/28/13  Yes Victorino December, MD  sulfamethoxazole-trimethoprim (BACTRIM DS,SEPTRA DS) 800-160 MG per tablet Take 1 pill daily 04/24/13  Yes Victorino December, MD  fluconazole (DIFLUCAN) 100 MG tablet Take 100 mg by mouth daily.    Historical Provider, MD    Physical Exam: Filed Vitals:   10/12/13 0913 10/12/13 0922 10/12/13 1428  BP:  142/94 121/78  Pulse: 126  107  Temp: 97.9 F (36.6 C)  98.9 F (37.2 C)  TempSrc: Oral  Oral  Resp: 24  19  SpO2: 95%  94%     Physical Exam: Blood pressure 121/78, pulse 107, temperature 98.9 F (37.2 C), temperature source Oral, resp. rate 19, SpO2 94.00%. Gen: No acute distress. Head: Normocephalic, atraumatic. Eyes: PERRL, EOMI, sclerae nonicteric. Mouth: Oropharynx + thrush. Neck: Supple, no thyromegaly, no lymphadenopathy, no jugular venous distention. Chest: Lungs CTAB. CV: Heart sounds are tachycardic, regular. Abdomen: Soft, nontender, nondistended with normal active bowel sounds. Extremities: Extremities with bruising, petechiae, no edema. Skin: Warm and dry. Neuro: Alert and oriented times 3; cranial nerves II through XII grossly intact. Psych: Mood and affect normal.  Labs on Admission:  Basic Metabolic Panel:  Recent Labs Lab 10/09/13 1300 10/12/13 1030  NA 141 133*  K 4.4 4.2  CL  --  92*  CO2 21* 25  GLUCOSE 181* 166*  BUN 19.6 17  CREATININE 0.7 0.83  CALCIUM 9.3 9.7   Liver Function Tests:  Recent Labs Lab 10/09/13 1300 10/12/13 1030  AST 30 19  ALT 124* 79*  ALKPHOS 89 94  BILITOT 0.35 0.3  PROT 6.9 7.2  ALBUMIN 2.6* 2.9*    Recent Labs Lab 10/12/13 1030  LIPASE 39   CBC:  Recent Labs Lab 10/09/13 1300  10/12/13 1030  WBC 9.7 8.7  NEUTROABS 8.6* 7.9*  HGB 12.7* 12.5*  HCT 37.6* 38.2*  MCV 98.2* 99.0  PLT 232 233    Radiological Exams on Admission: Ct Abdomen Pelvis W Contrast  10/12/2013   CLINICAL DATA:  Severe abdominal pain. History of CNS glioblastoma.  EXAM: CT ABDOMEN AND PELVIS WITH CONTRAST  TECHNIQUE: Multidetector CT imaging of the abdomen and pelvis was performed using the standard protocol following bolus administration of intravenous contrast.  CONTRAST:  OMNIPAQUE IOHEXOL 300 MG/ML  SOLN  COMPARISON:  10/11/2009  FINDINGS: Linear densities at the right lung base are suggestive for atelectasis. There is free intraperitoneal air in the left upper quadrant with air around the distal esophagus. There appears to be some air within the retroperitoneal space.  The largest focus of air is around the descending colon and there is fluid or inflammation in this area. The fluid collection around the descending colon roughly measures 5.2 x 3.7 cm and could represent a site for abscess formation. Findings raise concern for acute diverticulitis in this area. Patient has extensive colonic diverticula. In addition, there is a loop of small bowel in the mid lower abdomen that demonstrates severe wall thickening measuring up to 1.1 cm. There is fluid and edema tracking from this abnormal loop of bowel into the mesentery above it. There is some fluid and small nodes throughout the central mesentery and adjacent to the duodenum.  No gross abnormality to the liver, gallbladder or portal venous system. No gross abnormality of the pancreas, spleen, adrenal glands. There is mild perinephric stranding or edema around the kidneys which appears chronic. Probable bilateral renal cysts. Calcification in the left kidney lower pole measures 4 mm and suggestive for nonobstructive stone.  No gross abnormality to the prostate, seminal vesicles or urinary bladder. No significant free fluid in the pelvis. No acute bone  abnormality.  IMPRESSION: Study is positive for free air and suggestive for a bowel perforation. The largest focus of air is surrounding the descending colon. Patient has multiple colonic diverticula and findings could represent acute diverticulitis. There is fluid around the descending colon which may represent a potential site for abscess formation.  There is a severely thickened loop of small bowel in the mid abdomen. There is extensive edema and fluid tracking superior to this abnormal loop of bowel and towards the central mesentery. The patient is currently receiving chemotherapy and Avastin. Findings could represent a focal area of bowel ischemia from vasculitis. Findings may also associated with an infectious or inflammatory process.  Nonobstructive left kidney stone.  Renal cysts.  These results were called by telephone at the time of interpretation on 10/12/2013 at 12:27 PM to Dr. Rolland Porter , who verbally acknowledged these results.   Electronically Signed   By: Richarda Overlie M.D.   On: 10/12/2013 12:30    Assessment/Plan Principal Problem: Principal Problem:  Diverticulitis of colon with perforation  -Surgery consulted. -The patient's last dose of Avastin was given 10/09/2013. This medication (along with chronic steroids) can cause problems with wound healing. If possible, use percutaneous drainage but if not, please be aware of problems with delayed wound healing.  -Recommend empiric antibiotics per surgeon's preference (Invanz).  Active Problems:  Glioblastoma with residual right sided weakness  -Spoke with the patient's oncologist who recommends continuing steroids as the patient develops significant vasogenic edema with attempts to wean his dose.  -Continue Keppra for seizure prophylaxis.  -Continue Bactrim given recent treatment with Temodar (which can be associated with opportunistic infections such as PCP).  Hyperglycemia  -Would place on sliding scale insulin.  Thrush  -Would  continue Diflucan.  Code Status: Full. Family Communication: Updated at bedside. Disposition Plan: Home when stable.  Time spent: 70 minutes.  RAMA,CHRISTINA [facility] Pager [phone]  If 7PM-7AM, please contact night-coverage [email or web address] Password [facility] 10/12/2013, 3:40 PM

## 2013-10-12 NOTE — Care Management Note (Addendum)
    Page 1 of 2   10/27/2013     1:40:15 PM   CARE MANAGEMENT NOTE 10/27/2013  Patient:  Martin Martin, Martin Martin   Account Number:  [id number]  Date Initiated:  10/12/2013  Documentation initiated by:  Lanier Clam  Subjective/Objective Assessment:   57 Y/O M ADMITTED W/ABD PAIN.XB:[id number]     Action/Plan:   FROM HOME W/SPOUSE.HAS PCP,PHARMACY.   Anticipated DC Date:  10/19/2013   Anticipated DC Plan:  HOME/SELF CARE      DC Planning Services  CM consult      Choice offered to / List presented to:             Status of service:  In process, will continue to follow Medicare Important Message given?   (If response is "NO", the following Medicare IM given date fields will be blank) Date Medicare IM given:   Date Additional Medicare IM given:    Discharge Disposition:    Per UR Regulation:  Reviewed for med. necessity/level of care/duration of stay  If discussed at Long Length of Stay Meetings, dates discussed:    Comments:  10/27/13 1315 Elmer Bales RN, MSN, CM- Recieved word from [facility] that patient cannot be accepted. Dr Catha Gosselin was notified.   10/27/13 1150  Courtney Robarge RN, MSN, CM- Spoke with [facility] [employer] secretary. [facility] states that the patient has been followed for approximately 2 weeks and is most likely doing too well at this point for insurance approval.  Awaiting call back from [facility].   [id number] Earlene Plater, RN, BSN, CCM [phone] Chart Reviewed for discharge and hospital needs. Discharge needs at time of review:  None  patient with a.fuib post op and npo . Review of patient progress due on [id number].   10/13/13 KATHY MAHABIR RN,BSN NCM [phone] SX-LAPAROSCOPY,DRAINS.D/C PLAN HOME.  10/12/13 KATHY MAHABIR RN,BSN NCM [phone]

## 2013-10-12 NOTE — Progress Notes (Signed)
Pt arrived to floor from ED on stretcher, A&Ox4. One assist to bed. VSS, reports 5/10 abd pain but states this is tolerable and he does "not like to take pain meds." Pt and family oriented to callbell and environment and POC discussed. Tele orders clarified w/ DR Rama-- she states to put pt on tele only if HR >110.  Pt HR 90s upon arrival to floor.

## 2013-10-12 NOTE — ED Notes (Signed)
Patient with Hx of brain cancer and last chemo treatment Monday presents with lower medial abdominal pain "from navel down," which has interfered with his breathing some.

## 2013-10-12 NOTE — Consult Note (Deleted)
[facility] Internal Medicine CONSULT NOTE  EWEL LONA AVW:[id number] DOB: [birth year] DOA: 10/12/2013  Referring physician: Dr. Rolland Porter and Dr. Darlin Priestly PCP: [name], MD  Oncologist: Dr. Drue Second  Chief Complaint: Abdominal pain  Reason for consult: Medical management.     History of Present Illness: HARUTYUN MONTEVERDE is an 57 y.o. male with a PMH of glioblastoma of the left parietal brain, status post biopsy/debulking in April 2014 followed by radiation and adjuvant chemotherapy consisting of Temodar, currently being managed with Avastin every 2 weeks. He subsequently was started on steroids secondary to progression of disease with right-sided weakness in September 2014.  The patient presented to the ER with a 2-3 day history of abdominal pain and abdominal bloating.  Has not had a BM in 2 days despite treatment with magnesium citrate and only minimal results with a Fleets enema.  Review of Systems: Constitutional: No fever, no chills;  Appetite normal; No weight loss, + 30 pound weight gain in past few months, no fatigue.  HEENT: No blurry vision, no diplopia, no pharyngitis, no dysphagia CV: No chest pain, no palpitations, no PND.  Resp: + SOB, no cough, no pleuritic pain. GI: No nausea, no vomiting, no diarrhea, no melena, no hematochezia, + constipation.  GU: No dysuria, no hematuria, no frequency, no urgency. MSK: no myalgias, no arthralgias.  Neuro:  No headache, + right sided weakness, + history of focal seizure, on Keppra.  Psych: No depression, no anxiety.  Endo: No heat intolerance, no cold intolerance, no polyuria, no polydipsia  Skin: No rashes, no skin lesions.  Heme: + easy bruising.  Travel history: None in past 6 months.  Past Medical History Past Medical History  Diagnosis Date  . Hyperlipidemia   . Right bundle branch block     Normal LV function normal aortic and mitral valve bedside echocardiogram 4 2012  . Tachycardia     Resolved  .  Dyslipidemia   . Coronary artery disease (CAD) excluded     Cardiolite study 2006 within normal limits  . Hypertension   . Allergy   . Cancer      Past Surgical History Past Surgical History  Procedure Laterality Date  . Left inguinal hernia.  2004  . Craniotomy Left 03/29/2013    Procedure: CRANIOTOMY TUMOR EXCISION;  Surgeon: Karn Cassis, MD;  Location: [facility];  Service: Neurosurgery;  Laterality: Left;  Left Craniotomy for tumor excision     Social History: History   Social History  . Marital Status: Married    Spouse Name: Brinson,[name]    Number of Children: N/A  . Years of Education: N/A   Occupational History  .  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 Smoking status: Former Smoker -- 1.00 packs/day for 30 years    Types: Cigarettes    Quit date: 03/21/2005  . Smokeless tobacco: Never Used  . Alcohol Use: Yes     Comment: Occasional Brandy  . Drug Use: No  . Sexual Activity: Not Currently   Other Topics Concern  . Not on file   Social History Narrative  . No narrative on file    Family History:  Family History  Problem Relation Age of Onset  . Diverticulitis Father   . Alzheimer's disease Mother     Allergies: Dilaudid and Morphine and related  Meds: Prior to Admission medications   Medication Sig Start Date End Date Taking? Authorizing Provider  ALPRAZolam (XANAX) 0.25 MG tablet Take 1 tablet (0.25 mg total) by mouth at bedtime as needed for sleep. 04/24/13  Yes Victorino December, MD  calcium carbonate (TUMS - DOSED IN MG ELEMENTAL CALCIUM) 500 MG chewable tablet Chew 1 tablet by mouth daily. For indigestion   Yes Historical Provider, MD  dexamethasone (DECADRON) 4 MG tablet Take 1 tablet (4 mg total) by mouth every 6 (six) hours. Take 1 pill twice daily for 2 weeks then 1/2 pill twice daily until further instructions 09/11/13  Yes Alison Murray, MD  diltiazem (CARDIZEM CD) 120 MG 24 hr capsule Take 1 capsule (120 mg  total) by mouth daily. 03/04/12  Yes June Leap, MD  levETIRAcetam (KEPPRA) 500 MG tablet Take 500 mg by mouth at bedtime. Changed by Dr. Jeral Fruit 04/11/13 04/02/13  Yes Barnett Abu, MD  metoprolol tartrate (LOPRESSOR) 25 MG tablet Take 25 mg by mouth 2 (two) times daily.  03/20/13  Yes Historical Provider, MD  pantoprazole (PROTONIX) 40 MG tablet Take 40 mg by mouth daily. take 1 tablet by mouth once daily 07/28/13  Yes Victorino December, MD  sulfamethoxazole-trimethoprim (BACTRIM DS,SEPTRA DS) 800-160 MG per tablet Take 1 pill daily 04/24/13  Yes Victorino December, MD  fluconazole (DIFLUCAN) 100 MG tablet Take 100 mg by mouth daily.    Historical Provider, MD    Physical Exam: Filed Vitals:   10/12/13 0913 10/12/13 0922  BP:  142/94  Pulse: 126   Temp: 97.9 F (36.6 C)   TempSrc: Oral   Resp: 24   SpO2: 95%      Physical Exam: Blood pressure 142/94, pulse 126, temperature 97.9 F (36.6 C), temperature source Oral, resp. rate 24, SpO2 95.00%. Gen: No acute distress. Head: Normocephalic, atraumatic. Eyes: PERRL, EOMI, sclerae nonicteric. Mouth: Oropharynx + thrush.   Neck: Supple, no thyromegaly, no lymphadenopathy, no jugular venous distention. Chest: Lungs CTAB. CV: Heart sounds are tachycardic, regular. Abdomen: Distended, tender lower quadrants/umbilicus, no guarding or rebound. Extremities: Extremities with bruising, petechiae, no edema. Skin: Warm and dry. Neuro: Alert and oriented times 3; cranial nerves II through XII grossly intact. Psych: Mood and affect normal.  Labs on Admission:  Basic Metabolic Panel:  Recent Labs Lab 10/09/13 1300 10/12/13 1030  NA 141 133*  K 4.4 4.2  CL  --  92*  CO2 21* 25  GLUCOSE 181* 166*  BUN 19.6 17  CREATININE 0.7 0.83  CALCIUM 9.3 9.7   Liver Function Tests:  Recent Labs Lab 10/09/13 1300 10/12/13 1030  AST 30 19  ALT 124* 79*  ALKPHOS 89 94  BILITOT 0.35 0.3  PROT 6.9 7.2  ALBUMIN 2.6* 2.9*    Recent Labs Lab  10/12/13 1030  LIPASE 39   CBC:  Recent Labs Lab 10/09/13 1300 10/12/13 1030  WBC 9.7 8.7  NEUTROABS 8.6* 7.9*  HGB 12.7* 12.5*  HCT 37.6* 38.2*  MCV 98.2* 99.0  PLT 232 233   Radiological Exams on Admission: Ct Abdomen Pelvis W Contrast  10/12/2013   CLINICAL DATA:  Severe abdominal pain. History of CNS glioblastoma.  EXAM: CT ABDOMEN AND PELVIS WITH CONTRAST  TECHNIQUE: Multidetector CT imaging of the abdomen and pelvis was performed using the standard protocol following bolus administration of intravenous contrast.  CONTRAST:  OMNIPAQUE IOHEXOL 300 MG/ML  SOLN  COMPARISON:  10/11/2009  FINDINGS: Linear densities at the right lung base are suggestive for atelectasis. There is free intraperitoneal air in the left upper quadrant with air  around the distal esophagus. There appears to be some air within the retroperitoneal space. The largest focus of air is around the descending colon and there is fluid or inflammation in this area. The fluid collection around the descending colon roughly measures 5.2 x 3.7 cm and could represent a site for abscess formation. Findings raise concern for acute diverticulitis in this area. Patient has extensive colonic diverticula. In addition, there is a loop of small bowel in the mid lower abdomen that demonstrates severe wall thickening measuring up to 1.1 cm. There is fluid and edema tracking from this abnormal loop of bowel into the mesentery above it. There is some fluid and small nodes throughout the central mesentery and adjacent to the duodenum.  No gross abnormality to the liver, gallbladder or portal venous system. No gross abnormality of the pancreas, spleen, adrenal glands. There is mild perinephric stranding or edema around the kidneys which appears chronic. Probable bilateral renal cysts. Calcification in the left kidney lower pole measures 4 mm and suggestive for nonobstructive stone.  No gross abnormality to the prostate, seminal vesicles or  urinary bladder. No significant free fluid in the pelvis. No acute bone abnormality.  IMPRESSION: Study is positive for free air and suggestive for a bowel perforation. The largest focus of air is surrounding the descending colon. Patient has multiple colonic diverticula and findings could represent acute diverticulitis. There is fluid around the descending colon which may represent a potential site for abscess formation.  There is a severely thickened loop of small bowel in the mid abdomen. There is extensive edema and fluid tracking superior to this abnormal loop of bowel and towards the central mesentery. The patient is currently receiving chemotherapy and Avastin. Findings could represent a focal area of bowel ischemia from vasculitis. Findings may also associated with an infectious or inflammatory process.  Nonobstructive left kidney stone.  Renal cysts.  These results were called by telephone at the time of interpretation on 10/12/2013 at 12:27 PM to Dr. Rolland Porter , who verbally acknowledged these results.   Electronically Signed   By: Richarda Overlie M.D.   On: 10/12/2013 12:30    Assessment/Plan Principal Problem:   Diverticulitis of colon with perforation -The patient's last dose of Avastin was given  10/09/2013. This medication (along with chronic steroids) can cause problems with wound healing. If possible, use percutaneous drainage but if not, please be aware of problems with delayed wound healing. -Recommend empiric antibiotics per surgeon's preference. Active Problems:   Glioblastoma with residual right sided weakness -Spoke with the patient's oncologist who recommends continuing steroids as the patient develops significant vasogenic edema with attempts to wean his dose. -Continue Keppra for seizure prophylaxis. -Continue Bactrim given recent treatment with Temodar (which can be associated with opportunistic infections such as PCP).   Hyperglycemia -Would place on sliding scale insulin.    Thrush -Would continue Diflucan.  Thank you for this consultation. We will follow the patient with you.  Time spent: 70 minutes.  Lonisha Bobby [facility] Pager [phone]  If 7PM-7AM, please contact night-coverage [email or web address] Password [email or web address] 10/12/2013, 2:10 PM

## 2013-10-13 ENCOUNTER — Telehealth: Payer: Self-pay | Admitting: *Deleted

## 2013-10-13 ENCOUNTER — Encounter (HOSPITAL_COMMUNITY): Payer: BC Managed Care – PPO | Admitting: Anesthesiology

## 2013-10-13 ENCOUNTER — Inpatient Hospital Stay (HOSPITAL_COMMUNITY): Payer: BC Managed Care – PPO | Admitting: Anesthesiology

## 2013-10-13 ENCOUNTER — Telehealth: Payer: Self-pay | Admitting: Radiation Oncology

## 2013-10-13 ENCOUNTER — Encounter (HOSPITAL_COMMUNITY)
Admission: EM | Disposition: A | Discharge status: Hospice | Payer: Self-pay | Source: Home / Self Care | Attending: Internal Medicine

## 2013-10-13 DIAGNOSIS — R69 Illness, unspecified: Secondary | ICD-10-CM

## 2013-10-13 DIAGNOSIS — K650 Generalized (acute) peritonitis: Secondary | ICD-10-CM

## 2013-10-13 DIAGNOSIS — C719 Malignant neoplasm of brain, unspecified: Secondary | ICD-10-CM

## 2013-10-13 DIAGNOSIS — M6281 Muscle weakness (generalized): Secondary | ICD-10-CM

## 2013-10-13 DIAGNOSIS — I10 Essential (primary) hypertension: Secondary | ICD-10-CM

## 2013-10-13 HISTORY — PX: COLON RESECTION: SHX5231

## 2013-10-13 LAB — CBC
HCT: 35.5 % — ABNORMAL LOW (ref 39.0–52.0)
MCV: 99.4 fL (ref 78.0–100.0)
Platelets: 224 10*3/uL (ref 150–400)
RBC: 3.57 MIL/uL — ABNORMAL LOW (ref 4.22–5.81)
RDW: 16.3 % — ABNORMAL HIGH (ref 11.5–15.5)
WBC: 9.5 10*3/uL (ref 4.0–10.5)

## 2013-10-13 LAB — GLUCOSE, CAPILLARY
Glucose-Capillary: 123 mg/dL — ABNORMAL HIGH (ref 70–99)
Glucose-Capillary: 133 mg/dL — ABNORMAL HIGH (ref 70–99)
Glucose-Capillary: 142 mg/dL — ABNORMAL HIGH (ref 70–99)
Glucose-Capillary: 147 mg/dL — ABNORMAL HIGH (ref 70–99)

## 2013-10-13 LAB — SURGICAL PCR SCREEN
MRSA, PCR: NEGATIVE
Staphylococcus aureus: NEGATIVE

## 2013-10-13 LAB — BASIC METABOLIC PANEL
CO2: 25 mEq/L (ref 19–32)
Calcium: 9.4 mg/dL (ref 8.4–10.5)
GFR calc Af Amer: >90 mL/min (ref >90)
GFR calc non Af Amer: >90 mL/min (ref >90)
Potassium: 4.7 mEq/L (ref 3.5–5.1)
Sodium: 134 mEq/L — ABNORMAL LOW (ref 135–145)

## 2013-10-13 LAB — MAGNESIUM: Magnesium: 2.4 mg/dL (ref 1.5–2.5)

## 2013-10-13 LAB — PREALBUMIN: Prealbumin: 19.4 mg/dL (ref 17.0–34.0)

## 2013-10-13 LAB — PROTIME-INR: Prothrombin Time: 13.1 seconds (ref 11.6–15.2)

## 2013-10-13 SURGERY — COLON RESECTION LAPAROSCOPIC
Anesthesia: General | Site: Abdomen | Wound class: Contaminated

## 2013-10-13 MED ORDER — NEOSTIGMINE METHYLSULFATE 1 MG/ML IJ SOLN
INTRAMUSCULAR | Status: DC | PRN
  Administered 2013-10-13: 4 mg via INTRAVENOUS

## 2013-10-13 MED ORDER — LACTATED RINGERS IV SOLN: INTRAVENOUS | Status: DC | PRN

## 2013-10-13 MED ORDER — DILTIAZEM HCL 100 MG IV SOLR
3.0000 mg/h | INTRAVENOUS | Status: AC
  Administered 2013-10-13 – 2013-10-18 (×6): 3 mg/h via INTRAVENOUS
  Filled 2013-10-13 (×4): qty 100

## 2013-10-13 MED ORDER — POTASSIUM CHLORIDE IN NACL 20-0.9 MEQ/L-% IV SOLN
INTRAVENOUS | Status: AC
  Administered 2013-10-13 – 2013-10-16 (×6): via INTRAVENOUS
  Filled 2013-10-13 (×10): qty 1000

## 2013-10-13 MED ORDER — GLYCOPYRROLATE 0.2 MG/ML IJ SOLN
INTRAMUSCULAR | Status: DC | PRN
  Administered 2013-10-13: 0.6 mg via INTRAVENOUS

## 2013-10-13 MED ORDER — ONDANSETRON HCL 4 MG/2ML IJ SOLN: 4.0000 mg | Freq: Four times a day (QID) | INTRAMUSCULAR | Status: DC | PRN

## 2013-10-13 MED ORDER — ONDANSETRON HCL 4 MG/2ML IJ SOLN
INTRAMUSCULAR | Status: DC | PRN
  Administered 2013-10-13 (×2): 2 mg via INTRAVENOUS

## 2013-10-13 MED ORDER — SODIUM CHLORIDE 0.9 % IV SOLN
500.0000 mg | INTRAVENOUS | Status: DC
  Administered 2013-10-13 – 2013-10-21 (×9): 500 mg via INTRAVENOUS
  Filled 2013-10-13 (×11): qty 5

## 2013-10-13 MED ORDER — MEPERIDINE HCL 50 MG/ML IJ SOLN: 6.2500 mg | INTRAMUSCULAR | Status: DC | PRN

## 2013-10-13 MED ORDER — DEXAMETHASONE SODIUM PHOSPHATE 4 MG/ML IJ SOLN
2.0000 mg | Freq: Two times a day (BID) | INTRAMUSCULAR | Status: DC
  Administered 2013-10-13 – 2013-10-22 (×18): 2 mg via INTRAVENOUS
  Filled 2013-10-13: qty 0.5
  Filled 2013-10-13 (×2): qty 1
  Filled 2013-10-13: qty 0.5
  Filled 2013-10-13: qty 1
  Filled 2013-10-13 (×16): qty 0.5

## 2013-10-13 MED ORDER — SODIUM CHLORIDE 0.9 % IV SOLN: 1.0000 g | INTRAVENOUS | Status: DC

## 2013-10-13 MED ORDER — METOPROLOL TARTRATE 1 MG/ML IV SOLN
5.0000 mg | Freq: Four times a day (QID) | INTRAVENOUS | Status: DC
  Administered 2013-10-13 – 2013-10-15 (×8): 5 mg via INTRAVENOUS
  Filled 2013-10-13 (×11): qty 5

## 2013-10-13 MED ORDER — SODIUM CHLORIDE 0.9 % IV SOLN
INTRAVENOUS | Status: AC
  Filled 2013-10-13: qty 1

## 2013-10-13 MED ORDER — PANTOPRAZOLE SODIUM 40 MG IV SOLR
40.0000 mg | Freq: Two times a day (BID) | INTRAVENOUS | Status: DC
  Administered 2013-10-13 – 2013-10-23 (×20): 40 mg via INTRAVENOUS
  Filled 2013-10-13 (×24): qty 40

## 2013-10-13 MED ORDER — FENTANYL CITRATE 0.05 MG/ML IJ SOLN
INTRAMUSCULAR | Status: DC | PRN
  Administered 2013-10-13: 50 ug via INTRAVENOUS
  Administered 2013-10-13: 100 ug via INTRAVENOUS
  Administered 2013-10-13 (×2): 50 ug via INTRAVENOUS

## 2013-10-13 MED ORDER — DEXAMETHASONE 2 MG PO TABS
2.0000 mg | ORAL_TABLET | Freq: Two times a day (BID) | ORAL | Status: DC
  Administered 2013-10-13: 2 mg via ORAL
  Filled 2013-10-13 (×2): qty 1

## 2013-10-13 MED ORDER — FENTANYL CITRATE 0.05 MG/ML IJ SOLN
INTRAMUSCULAR | Status: AC
  Filled 2013-10-13: qty 2

## 2013-10-13 MED ORDER — PROPOFOL 10 MG/ML IV BOLUS
INTRAVENOUS | Status: DC | PRN
  Administered 2013-10-13: 200 mg via INTRAVENOUS

## 2013-10-13 MED ORDER — ONDANSETRON HCL 4 MG PO TABS
4.0000 mg | ORAL_TABLET | Freq: Four times a day (QID) | ORAL | Status: DC | PRN
  Administered 2013-11-02: 4 mg via ORAL
  Filled 2013-10-13 (×2): qty 1

## 2013-10-13 MED ORDER — LACTATED RINGERS IV SOLN: INTRAVENOUS | Status: DC

## 2013-10-13 MED ORDER — DEXAMETHASONE SODIUM PHOSPHATE 4 MG/ML IJ SOLN: 1.0000 mg | Freq: Two times a day (BID) | INTRAMUSCULAR | Status: DC

## 2013-10-13 MED ORDER — BUPIVACAINE-EPINEPHRINE 0.5% -1:200000 IJ SOLN
INTRAMUSCULAR | Status: DC | PRN
  Administered 2013-10-13: 27 mL

## 2013-10-13 MED ORDER — SODIUM CHLORIDE 0.9 % IJ SOLN
INTRAMUSCULAR | Status: AC
  Administered 2013-10-13: 10 mL
  Filled 2013-10-13: qty 3

## 2013-10-13 MED ORDER — FENTANYL CITRATE 0.05 MG/ML IJ SOLN
25.0000 ug | INTRAMUSCULAR | Status: DC | PRN
  Administered 2013-10-13 – 2013-10-14 (×4): 25 ug via INTRAVENOUS
  Filled 2013-10-13 (×5): qty 2

## 2013-10-13 MED ORDER — LACTATED RINGERS IR SOLN
Status: DC | PRN
  Administered 2013-10-13: 3000 mL

## 2013-10-13 MED ORDER — PROMETHAZINE HCL 25 MG/ML IJ SOLN: 6.2500 mg | INTRAMUSCULAR | Status: DC | PRN

## 2013-10-13 MED ORDER — EPHEDRINE SULFATE 50 MG/ML IJ SOLN
INTRAMUSCULAR | Status: DC | PRN
  Administered 2013-10-13: 5 mg via INTRAVENOUS

## 2013-10-13 MED ORDER — LACTATED RINGERS IV SOLN
INTRAVENOUS | Status: DC | PRN
  Administered 2013-10-13: 13:00:00 via INTRAVENOUS

## 2013-10-13 MED ORDER — FLUCONAZOLE IN SODIUM CHLORIDE 200-0.9 MG/100ML-% IV SOLN
200.0000 mg | INTRAVENOUS | Status: AC
  Administered 2013-10-13 – 2013-10-22 (×10): 200 mg via INTRAVENOUS
  Filled 2013-10-13 (×10): qty 100

## 2013-10-13 MED ORDER — INSULIN ASPART 100 UNIT/ML ~~LOC~~ SOLN
0.0000 [IU] | Freq: Four times a day (QID) | SUBCUTANEOUS | Status: DC
  Administered 2013-10-14 (×2): 1 [IU] via SUBCUTANEOUS
  Administered 2013-10-17 (×2): 2 [IU] via SUBCUTANEOUS

## 2013-10-13 MED ORDER — MIDAZOLAM HCL 5 MG/5ML IJ SOLN
INTRAMUSCULAR | Status: DC | PRN
  Administered 2013-10-13 (×2): 1 mg via INTRAVENOUS

## 2013-10-13 MED ORDER — SUCCINYLCHOLINE CHLORIDE 20 MG/ML IJ SOLN
INTRAMUSCULAR | Status: DC | PRN
  Administered 2013-10-13: 120 mg via INTRAVENOUS

## 2013-10-13 MED ORDER — BUPIVACAINE-EPINEPHRINE PF 0.5-1:200000 % IJ SOLN
INTRAMUSCULAR | Status: AC
Start: 2013-10-13 — End: 2013-10-13
  Filled 2013-10-13: qty 30

## 2013-10-13 MED ORDER — LACTATED RINGERS IV SOLN
INTRAVENOUS | Status: DC | PRN
  Administered 2013-10-13 (×2): via INTRAVENOUS

## 2013-10-13 MED ORDER — LIDOCAINE HCL (CARDIAC) 20 MG/ML IV SOLN
INTRAVENOUS | Status: DC | PRN
  Administered 2013-10-13: 75 mg via INTRAVENOUS

## 2013-10-13 MED ORDER — ENOXAPARIN SODIUM 40 MG/0.4ML ~~LOC~~ SOLN
40.0000 mg | SUBCUTANEOUS | Status: DC
  Administered 2013-10-14 – 2013-11-02 (×20): 40 mg via SUBCUTANEOUS
  Filled 2013-10-13 (×23): qty 0.4

## 2013-10-13 MED ORDER — FENTANYL CITRATE 0.05 MG/ML IJ SOLN
25.0000 ug | INTRAMUSCULAR | Status: DC | PRN
  Administered 2013-10-13 (×3): 50 ug via INTRAVENOUS

## 2013-10-13 MED ORDER — FENTANYL CITRATE 0.05 MG/ML IJ SOLN
INTRAMUSCULAR | Status: AC
Start: 2013-10-13 — End: 2013-10-14
  Filled 2013-10-13: qty 2

## 2013-10-13 MED ORDER — ROCURONIUM BROMIDE 100 MG/10ML IV SOLN
INTRAVENOUS | Status: DC | PRN
  Administered 2013-10-13: 30 mg via INTRAVENOUS
  Administered 2013-10-13: 10 mg via INTRAVENOUS

## 2013-10-13 SURGICAL SUPPLY — 71 items
APPLIER CLIP 5 13 M/L LIGAMAX5 (MISCELLANEOUS)
APPLIER CLIP ROT 10 11.4 M/L (STAPLE)
APR CLP MED LRG 11.4X10 (STAPLE)
APR CLP MED LRG 5 ANG JAW (MISCELLANEOUS)
BLADE EXTENDED COATED 6.5IN (ELECTRODE) ×2 IMPLANT
BLADE HEX COATED 2.75 (ELECTRODE) ×2 IMPLANT
BLADE SURG SZ10 CARB STEEL (BLADE) ×2 IMPLANT
CANISTER SUCTION 2500CC (MISCELLANEOUS) ×2 IMPLANT
CELLS DAT CNTRL 66122 CELL SVR (MISCELLANEOUS) IMPLANT
CLIP APPLIE 5 13 M/L LIGAMAX5 (MISCELLANEOUS) IMPLANT
CLIP APPLIE ROT 10 11.4 M/L (STAPLE) IMPLANT
CLOTH BEACON ORANGE TIMEOUT ST (SAFETY) ×2 IMPLANT
CONT SPEC 4OZ CLIKSEAL STRL BL (MISCELLANEOUS) ×1 IMPLANT
COVER MAYO STAND STRL (DRAPES) ×2 IMPLANT
DECANTER SPIKE VIAL GLASS SM (MISCELLANEOUS) IMPLANT
DRAIN CHANNEL RND F F (WOUND CARE) ×3 IMPLANT
DRAPE LAPAROSCOPIC ABDOMINAL (DRAPES) ×2 IMPLANT
DRAPE LG THREE QUARTER DISP (DRAPES) ×2 IMPLANT
DRAPE POUCH INSTRU U-SHP 10X18 (DRAPES) ×2 IMPLANT
DRAPE WARM FLUID 44X44 (DRAPE) ×2 IMPLANT
ELECT REM PT RETURN 9FT ADLT (ELECTROSURGICAL) ×2
ELECTRODE REM PT RTRN 9FT ADLT (ELECTROSURGICAL) ×1 IMPLANT
ENSEAL DEVICE STD TIP 35CM (ENDOMECHANICALS) IMPLANT
EVACUATOR SILICONE 100CC (DRAIN) ×3 IMPLANT
GAUZE SPONGE 2X2 8PLY STRL LF (GAUZE/BANDAGES/DRESSINGS) IMPLANT
GLOVE BIOGEL PI IND STRL 7.0 (GLOVE) ×1 IMPLANT
GLOVE BIOGEL PI INDICATOR 7.0 (GLOVE) ×1
GLOVE EUDERMIC 7 POWDERFREE (GLOVE) ×2 IMPLANT
GOWN PREVENTION PLUS LG XLONG (DISPOSABLE) ×2 IMPLANT
GOWN STRL REIN XL XLG (GOWN DISPOSABLE) ×4 IMPLANT
KIT BASIN OR (CUSTOM PROCEDURE TRAY) ×2 IMPLANT
LEGGING LITHOTOMY PAIR STRL (DRAPES) ×1 IMPLANT
LIGASURE IMPACT 36 18CM CVD LR (INSTRUMENTS) IMPLANT
NS IRRIG 1000ML POUR BTL (IV SOLUTION) ×2 IMPLANT
PENCIL BUTTON HOLSTER BLD 10FT (ELECTRODE) ×2 IMPLANT
RETRACTOR WND ALEXIS 18 MED (MISCELLANEOUS) IMPLANT
RTRCTR WOUND ALEXIS 18CM MED (MISCELLANEOUS)
SCALPEL HARMONIC ACE (MISCELLANEOUS) IMPLANT
SCISSORS LAP 5X35 DISP (ENDOMECHANICALS) ×2 IMPLANT
SET IRRIG TUBING LAPAROSCOPIC (IRRIGATION / IRRIGATOR) ×2 IMPLANT
SLEEVE XCEL OPT CAN 5 100 (ENDOMECHANICALS) ×1 IMPLANT
SOLUTION ANTI FOG 6CC (MISCELLANEOUS) ×2 IMPLANT
SPONGE DRAIN TRACH 4X4 STRL 2S (GAUZE/BANDAGES/DRESSINGS) ×1 IMPLANT
SPONGE GAUZE 2X2 STER 10/PKG (GAUZE/BANDAGES/DRESSINGS) ×1
SPONGE GAUZE 4X4 12PLY (GAUZE/BANDAGES/DRESSINGS) ×2 IMPLANT
SPONGE LAP 18X18 X RAY DECT (DISPOSABLE) ×1 IMPLANT
STAPLER VISISTAT 35W (STAPLE) ×2 IMPLANT
STRIP CLOSURE SKIN 1/2X4 (GAUZE/BANDAGES/DRESSINGS) ×2 IMPLANT
SUCTION POOLE TIP (SUCTIONS) ×2 IMPLANT
SUT ETHILON 3 0 PS 1 (SUTURE) ×3 IMPLANT
SUT PDS AB 1 CT1 27 (SUTURE) ×4 IMPLANT
SUT PDS AB 1 TP1 96 (SUTURE) ×4 IMPLANT
SUT SILK 2 0 (SUTURE) ×2
SUT SILK 2 0 SH CR/8 (SUTURE) ×2 IMPLANT
SUT SILK 2-0 18XBRD TIE 12 (SUTURE) ×1 IMPLANT
SUT SILK 3 0 (SUTURE) ×2
SUT SILK 3 0 SH CR/8 (SUTURE) ×4 IMPLANT
SUT SILK 3-0 18XBRD TIE 12 (SUTURE) ×1 IMPLANT
TOWEL OR 17X26 10 PK STRL BLUE (TOWEL DISPOSABLE) ×2 IMPLANT
TRAY FOLEY CATH 14FRSI W/METER (CATHETERS) ×1 IMPLANT
TRAY FOLEY CATH 16FRSI W/METER (SET/KITS/TRAYS/PACK) ×2 IMPLANT
TRAY LAP CHOLE (CUSTOM PROCEDURE TRAY) ×2 IMPLANT
TROCAR BLADELESS OPT 5 75 (ENDOMECHANICALS) ×4 IMPLANT
TROCAR SLEEVE XCEL 5X75 (ENDOMECHANICALS) ×3 IMPLANT
TROCAR XCEL BLUNT TIP 100MML (ENDOMECHANICALS) IMPLANT
TROCAR XCEL NON-BLD 11X100MML (ENDOMECHANICALS) ×1 IMPLANT
TROCAR XCEL UNIV SLVE 11M 100M (ENDOMECHANICALS) ×1 IMPLANT
TUBING INSUFFLATION 10FT LAP (TUBING) ×2 IMPLANT
WATER STERILE IRR 1500ML POUR (IV SOLUTION) ×2 IMPLANT
YANKAUER SUCT BULB TIP 10FT TU (MISCELLANEOUS) ×2 IMPLANT
YANKAUER SUCT BULB TIP NO VENT (SUCTIONS) ×2 IMPLANT

## 2013-10-13 NOTE — Progress Notes (Signed)
Patient ID: Martin Martin, male   DOB: [birth year], 57 y.o.   MRN: [id number]    Subjective: Pt feels much better today.  His abdominal pain is significantly less, almost NT today.  No nausea, vomiting, or diarrhea.    Objective: Vital signs in last 24 hours: Temp:  [97.4 F (36.3 C)-98.9 F (37.2 C)] 97.4 F (36.3 C) (10/24 0411) Pulse Rate:  [74-126] 74 (10/24 0411) Resp:  [16-24] 20 (10/24 0411) BP: (114-145)/(66-94) 129/90 mmHg (10/24 0411) SpO2:  [93 %-97 %] 96 % (10/24 0411) Weight:  [195 lb 15.8 oz (88.9 kg)] 195 lb 15.8 oz (88.9 kg) (10/23 1614) Last BM Date: 10/11/13  Intake/Output from previous day: 10/23 0701 - 10/24 0700 In: 2119.2 [I.V.:1969.2; IV Piggyback:150] Out: 2530 [Urine:2530] Intake/Output this shift:    PE: Abd: soft, minimally tender in LLQ, +BS, ND Heart: regular Lungs: CTAB  Lab Results:   Recent Labs  10/12/13 1030 10/13/13 0515  WBC 8.7 9.5  HGB 12.5* 11.8*  HCT 38.2* 35.5*  PLT 233 224   BMET  Recent Labs  10/12/13 1030 10/13/13 0515  NA 133* 134*  K 4.2 4.7  CL 92* 98  CO2 25 25  GLUCOSE 166* 164*  BUN 17 13  CREATININE 0.83 0.77  CALCIUM 9.7 9.4   PT/INR  Recent Labs  10/12/13 1555 10/13/13 0515  LABPROT 12.9 13.1  INR 0.99 1.01   CMP     Component Value Date/Time   NA 134* 10/13/2013 0515   NA 141 10/09/2013 1300   K 4.7 10/13/2013 0515   K 4.4 10/09/2013 1300   CL 98 10/13/2013 0515   CL 102 06/05/2013 1406   CO2 25 10/13/2013 0515   CO2 21* 10/09/2013 1300   GLUCOSE 164* 10/13/2013 0515   GLUCOSE 181* 10/09/2013 1300   GLUCOSE 165* 06/05/2013 1406   BUN 13 10/13/2013 0515   BUN 19.6 10/09/2013 1300   CREATININE 0.77 10/13/2013 0515   CREATININE 0.7 10/09/2013 1300   CALCIUM 9.4 10/13/2013 0515   CALCIUM 9.3 10/09/2013 1300   PROT 7.2 10/12/2013 1030   PROT 6.9 10/09/2013 1300   ALBUMIN 2.9* 10/12/2013 1030   ALBUMIN 2.6* 10/09/2013 1300   AST 19 10/12/2013 1030   AST 30 10/09/2013 1300   ALT 79*  10/12/2013 1030   ALT 124* 10/09/2013 1300   ALKPHOS 94 10/12/2013 1030   ALKPHOS 89 10/09/2013 1300   BILITOT 0.3 10/12/2013 1030   BILITOT 0.35 10/09/2013 1300   GFRNONAA >90 10/13/2013 0515   GFRAA >90 10/13/2013 0515   Lipase     Component Value Date/Time   LIPASE 39 10/12/2013 1030       Studies/Results: Ct Abdomen Pelvis W Contrast  10/12/2013   CLINICAL DATA:  Severe abdominal pain. History of CNS glioblastoma.  EXAM: CT ABDOMEN AND PELVIS WITH CONTRAST  TECHNIQUE: Multidetector CT imaging of the abdomen and pelvis was performed using the standard protocol following bolus administration of intravenous contrast.  CONTRAST:  OMNIPAQUE IOHEXOL 300 MG/ML  SOLN  COMPARISON:  10/11/2009  FINDINGS: Linear densities at the right lung base are suggestive for atelectasis. There is free intraperitoneal air in the left upper quadrant with air around the distal esophagus. There appears to be some air within the retroperitoneal space. The largest focus of air is around the descending colon and there is fluid or inflammation in this area. The fluid collection around the descending colon roughly measures 5.2 x 3.7 cm and could represent a site  for abscess formation. Findings raise concern for acute diverticulitis in this area. Patient has extensive colonic diverticula. In addition, there is a loop of small bowel in the mid lower abdomen that demonstrates severe wall thickening measuring up to 1.1 cm. There is fluid and edema tracking from this abnormal loop of bowel into the mesentery above it. There is some fluid and small nodes throughout the central mesentery and adjacent to the duodenum.  No gross abnormality to the liver, gallbladder or portal venous system. No gross abnormality of the pancreas, spleen, adrenal glands. There is mild perinephric stranding or edema around the kidneys which appears chronic. Probable bilateral renal cysts. Calcification in the left kidney lower pole measures 4 mm  and suggestive for nonobstructive stone.  No gross abnormality to the prostate, seminal vesicles or urinary bladder. No significant free fluid in the pelvis. No acute bone abnormality.  IMPRESSION: Study is positive for free air and suggestive for a bowel perforation. The largest focus of air is surrounding the descending colon. Patient has multiple colonic diverticula and findings could represent acute diverticulitis. There is fluid around the descending colon which may represent a potential site for abscess formation.  There is a severely thickened loop of small bowel in the mid abdomen. There is extensive edema and fluid tracking superior to this abnormal loop of bowel and towards the central mesentery. The patient is currently receiving chemotherapy and Avastin. Findings could represent a focal area of bowel ischemia from vasculitis. Findings may also associated with an infectious or inflammatory process.  Nonobstructive left kidney stone.  Renal cysts.  These results were called by telephone at the time of interpretation on 10/12/2013 at 12:27 PM to Dr. Rolland Porter , who verbally acknowledged these results.   Electronically Signed   By: Richarda Overlie M.D.   On: 10/12/2013 12:30    Anti-infectives: Anti-infectives   Start     Dose/Rate Route Frequency Ordered Stop   10/12/13 1600  fluconazole (DIFLUCAN) tablet 100 mg     100 mg Oral Daily 10/12/13 1428     10/12/13 1600  sulfamethoxazole-trimethoprim (BACTRIM DS) 800-160 MG per tablet 1 tablet     1 tablet Oral Daily 10/12/13 1428     10/12/13 1600  ertapenem (INVANZ) 1 g in sodium chloride 0.9 % 50 mL IVPB     1 g 100 mL/hr over 30 Minutes Intravenous Every 24 hours 10/12/13 1507         Assessment/Plan  1. Perforated viscus, felt to likely be secondary to diverticulitis 2. Small bowel thickening, ? Reactive to #1 Patient Active Problem List   Diagnosis Date Noted  . Hyperglycemia 10/12/2013  . Diverticulitis of colon with perforation  10/12/2013  . Thrush 10/12/2013  . Right sided weakness 09/08/2013  . 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 Focal seizure 03/24/2013  . Hypertension   . Coronary artery disease (CAD) excluded    Plan: 1. Patient feels significantly better today.  He has no evidence of peritonitis, which is difficult to evaluate secondary to steroid and Avastin use.  However, his exam today along with his vitals reveal someone who does not appear to be increasingly ill.  For now, would continue to treat conservatively; however, if he begins to worsen, he may need surgical intervention. 2. Cont NPO for now.  The patient will need to be advanced slower than usual given his lack of ability to heal.  His microperforation may not be completely healed yet  and we don't want to make him worse.  Will d/w Dr. Derrell Lolling when he feels the patient may be a candidate for a liquid diet.  LOS: 1 day    Jeryn Bertoni E 10/13/2013, 8:03 AM Pager: [phone]

## 2013-10-13 NOTE — Progress Notes (Signed)
General surgery attending note:  The patient has discussed his surgical options with his wife. He has now decided to go ahead with the laparoscopy washout and placement of drains.  He is opposed to any laparotomy. He is opposed to any ostomy.  I discussed the indications, details, techniques and numerous risk of the surgery with him as outlined in my previous notes. He has a clear understanding of these issues. All of his questions and questions of his family have been answered.  He is in full agreement with this plan.   Angelia Mould. Derrell Lolling, M.D., [facility] General and Minimally invasive Surgery Breast and Colorectal Surgery Office:   [phone] Pager:   [phone]

## 2013-10-13 NOTE — Anesthesia Preprocedure Evaluation (Addendum)
Anesthesia Evaluation  Patient identified by MRN, date of birth, ID band Patient awake    Reviewed: Allergy & Precautions, H&P , NPO status , Patient's Chart, lab work & pertinent test results  Airway Mallampati: II TM Distance: >3 FB Neck ROM: Full    Dental  (+) Dental Advisory Given   Pulmonary former smoker,  breath sounds clear to auscultation        Cardiovascular hypertension, Pt. on medications + CAD + dysrhythmias Rhythm:Regular Rate:Normal     Neuro/Psych negative neurological ROS  negative psych ROS   GI/Hepatic negative GI ROS, Neg liver ROS,   Endo/Other  negative endocrine ROS  Renal/GU negative Renal ROS     Musculoskeletal negative musculoskeletal ROS (+)   Abdominal   Peds  Hematology negative hematology ROS (+)   Anesthesia Other Findings   Reproductive/Obstetrics                           Anesthesia Physical Anesthesia Plan  ASA: IV and emergent  Anesthesia Plan: General   Post-op Pain Management:    Induction: Intravenous, Rapid sequence and Cricoid pressure planned  Airway Management Planned: Oral ETT  Additional Equipment:   Intra-op Plan:   Post-operative Plan: Extubation in OR  Informed Consent: I have reviewed the patients History and Physical, chart, labs and discussed the procedure including the risks, benefits and alternatives for the proposed anesthesia with the patient or authorized representative who has indicated his/her understanding and acceptance.   Dental advisory given  Plan Discussed with: CRNA  Anesthesia Plan Comments:      Anesthesia Quick Evaluation                                   Anesthesia Evaluation  Patient identified by MRN, date of birth, ID band Patient awake    Reviewed: Allergy & Precautions, H&P , NPO status , Patient's Chart, lab work & pertinent test results, reviewed documented beta blocker date and time    Airway Mallampati: II TM Distance: >3 FB Neck ROM: full    Dental   Pulmonary neg pulmonary ROS,  breath sounds clear to auscultation        Cardiovascular hypertension, On Medications + dysrhythmias Rhythm:regular     Neuro/Psych  Headaches, Seizures -,  negative psych ROS   GI/Hepatic negative GI ROS, Neg liver ROS,   Endo/Other  negative endocrine ROS  Renal/GU negative Renal ROS  negative genitourinary   Musculoskeletal   Abdominal   Peds  Hematology negative hematology ROS (+)   Anesthesia Other Findings See surgeon's H&P   Reproductive/Obstetrics negative OB ROS                           Anesthesia Physical Anesthesia Plan  ASA: III  Anesthesia Plan: General   Post-op Pain Management:    Induction: Intravenous  Airway Management Planned: Oral ETT  Additional Equipment: Arterial line  Intra-op Plan:   Post-operative Plan: Possible Post-op intubation/ventilation  Informed Consent: I have reviewed the patients History and Physical, chart, labs and discussed the procedure including the risks, benefits and alternatives for the proposed anesthesia with the patient or authorized representative who has indicated his/her understanding and acceptance.   Dental Advisory Given  Plan Discussed with: CRNA and Surgeon  Anesthesia Plan Comments:         Anesthesia Quick  Evaluation

## 2013-10-13 NOTE — Telephone Encounter (Addendum)
Ms. Talitha Givens called to relay information that [name] will be scheduled for a laparoscopy to "clean out infection" and drains may be placed.  The surgeon thinks there is" leakage and air" in his abdomen. She stated she will keep [place] informed.

## 2013-10-13 NOTE — Op Note (Signed)
Patient Name:           Martin Martin   Date of Surgery:        10/13/2013  Pre op Diagnosis:      Perforated viscus  Post op Diagnosis:    Perforated viscus with localized peritonitis involving pelvic small bowel loops. Possible diverticulitis  Procedure:                 Diagnostic laparoscopy, extensive abdominal and pelvic lavage, placement of pelvic drain, lower abdominal drain, and left paracolic drain    Surgeon:                     Angelia Mould. Derrell Lolling, M.D., FACS  Assistant.:,  Karie Soda, M.D., [facility]  Operative Indications:   This is a 57 year old Caucasian man with a history of craniotomy for glioblastoma in April of this year. He is dependent on steroids, high dose, to prevent cerebral swelling and neurologic deterioration. He has been treated with avastin recently. He presents with a three-day history of abdominal pain. Abdominal exam reveals lower abdominal tenderness and guarding. CT scan shows some thickening of the distal descending colon, some thickening of loop of small bowel in the lower abdomen, and scattered free air throughout the abdomen. He was counseled  regarding the high-risk nature of surgery given his immunocompromised state. He is aware of his limited prognosis. He was given several options. After numerous discussions we have decided to take him to the operating room for diagnostic laparoscopy, abdominal washout, and placement of drains.  Operative Findings:       There was a focal area of peritonitis involving 2 or 3 loops of distal small bowel. I never saw a hole in the bowel or a perforation of the small bowel or colon. I examined the colon from the rectum up the sigmoid and the descending colon to the splenic flexure. It was no purulence in the left paracolic gutter. There is no mass that I can detect. There was no purulence in the upper abdomen. The omentum was soft and easily mobile. The proximal small bowel was normal. There was purulence and inflammation of  the small bowel in the region of the sacral promontory and extending over to the ileocecal valve. There was no Meckel's diverticulum. I did not see the appendix. I was reluctant to be aggressive and with manipulating the bowel for fear that I would tear it. There was never any sign of any intestinal content. I left a drain in the pelvis, between the loops of small bowel, and up the left paracolic gutter.  Procedure in Detail:          Following the induction of general endotracheal anesthesia a Foley catheter was placed, a nasogastric tube was placed, and the abdomen was prepped and draped in a sterile fashion. Surgical time out performed. 0.5% Marcaine with epinephrine was used as local infiltration anesthetic. I placed an 11 mm Hassan trocar at the upper rim of the umbilicus with an open technique. Entry was uneventful. There was no odor. This was secured with a pursestring suture of 0 Vicryl being careful to take wide stitches. Pneumoperitoneum was created. Videocamera was inserted.  I ultimately placed 4 more 5 mm trocars.  I looked at the liver and stomach and splenic flexure and saw  nothing abnormal there. The left colon looked normal.  I  looked at the omentum and lifted it up and saw nothing abnormal there. I looked in the  left paracolic gutter and saw no real mass or purulence although there were a lot of chronic adhesions of the sigmoid colon to the left lateral abdominal wall. Looking both laterally and medially to the left colon I did not find an obvious inflammatory process or purulence.  There were some loops of small bowel down in the upper pelvis as described above there was lots of purulence between these loops but I never saw a true perforation. This could have been primarily inflamed or secondarily inflamed. I spent a long time separating these loops and washing out all the purulence. I never saw the specific etiology. I then copiously irrigated the abdomen and pelvis with several liters of  saline. I placed a 62 [place] Blake drain in the pelvis, a 19 [place] Blake drain between the loops of small bowel extending over to the ileocecal valve, and a 19 Blake drain into left paracolic gutter. All these drains were brought out through trocar sites and sutured to skin with nylon sutures and connected to suction bulbs. I further inspected the abdomen and pelvis and found no site of bleeding or perforation. Trocars were removed and the pneumoperitoneum was released. The fascia at the umbilicus was closed with 0 Vicryl sutures. The skin incision closed with skin staples. Clean bandages were placed the patient taken to recovery in stable condition. EBL 20 cc. Counts correct. Complications none. Cultures of the peritoneal fluid were sent.     Angelia Mould. Derrell Lolling, M.D., FACS General and Minimally Invasive Surgery Breast and Colorectal Surgery  10/13/2013 3:48 PM

## 2013-10-13 NOTE — Progress Notes (Addendum)
Surgery attending:  He has less pain. No N/V. Patient is alert and stable. HR variable.intermittant tachycardia.  No fever. HD stable. Exam reveals protruberant abdomen. Lower abdominal tenderness. No peritoneal signs.   Lactate down to 1.0. Hgb stable, 11.8, INR 1.01,  NORMAL.   Assessment: 1)  Perforated viscus, presumed diverticulitis, other etiologies possible, including small bowel. 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  Craniotomy in April. Limited prognosis, of which patient is aware.   Plan: Case discussed with three surgical partners this am. We all agree that nonoperative therapy is likely to lead to continued peritonitis due to nonhealing from avastin/steroids. This is an option. Expl. laparotomy also extremely  high risk due to poor healing of wounds, anastomoses, ostomies. He might never recover from this.  This is also an option.  Best option for chance of recovery and discharge home with reasonable QOL seems to be laparoscopy, identification of source,   washout, placement of drains, which might control  any potential intestinal  iistula.  This was proposed to patient, and I advised surgery today, before he deteriorates. He knows that all options are high risk. He is calling his family in for conference.   Angelia Mould. Derrell Lolling, M.D., [facility] General and Minimally invasive Surgery Breast and Colorectal Surgery Office:   [phone] Pager:   [phone]

## 2013-10-13 NOTE — Progress Notes (Signed)
I had a 45 minute conversation with the patient, his sister, and his mother.  I outlined the surgical options at this point in time and my recommendations as to what I think may be the best option. He is aware that we can continue to treat him medically, alternatively consider laparoscopy, identification of the source, washout and drainage, and laparotomy.  I have advised diagnostic laparoscopy and washout with drains as theoretically having the best risk benefit profile. There is great uncertainty re: outcome considering his metabolic and immune problems. We went over this in great detail. We talked about what might happen if we treated medically including sudden deterioration into septic shock at a later date, or abscesses which might require an open or image guided drainage. He asked if drainage tubes would be temporary or permanent and I told him I did not know. Certainly if he developed a controlled fistula we will leave that in place to prevent a recurrent abscess and sepsis. He seems clearly opposed to any major laparotomy and colostomy.  he clearly understands that we treat him medically that he may not not get well. He knows that if we operated on him, he also may not get well.  At this point in time he has decided to continue medical therapy, at least for now. He's going to think about this with his family and also talked about this with his wife. I told him that we would consider laparoscopy and washout with drainage either this afternoon or tomorrow if he changes his mind.  He is alert and clearly understands the implications of his decision-making.   Angelia Mould. Derrell Lolling, M.D., [facility] General and Minimally invasive Surgery Breast and Colorectal Surgery Office:   [phone] Pager:   [phone]

## 2013-10-13 NOTE — Telephone Encounter (Signed)
Martin Martin, patient's sister, left message requesting return call. Called. No answer. Left message.

## 2013-10-13 NOTE — Transfer of Care (Signed)
Immediate Anesthesia Transfer of Care Note  Patient: Martin Martin  Procedure(s) Performed: Procedure(s) (LRB): DIAGNOSTIC LAPAROSCOPY and Placement of Drains (N/A)  Patient Location: PACU  Anesthesia Type: General  Level of Consciousness: sedated, patient cooperative and responds to stimulation  Airway & Oxygen Therapy: Patient Spontanous Breathing and Patient connected to face mask oxgen  Post-op Assessment: Report given to PACU RN and Post -op Vital signs reviewed and stable  Post vital signs: Reviewed and stable  Complications: No apparent anesthesia complications

## 2013-10-13 NOTE — Anesthesia Procedure Notes (Signed)
Procedure Name: Intubation Date/Time: 10/13/2013 2:07 PM Performed by: Edison Pace Pre-anesthesia Checklist: Emergency Drugs available, Patient identified, Timeout performed, Suction available and Patient being monitored Patient Re-evaluated:Patient Re-evaluated prior to inductionOxygen Delivery Method: Circle system utilized Preoxygenation: Pre-oxygenation with 100% oxygen Intubation Type: IV induction and Cricoid Pressure applied Ventilation: Mask ventilation without difficulty Laryngoscope Size: Mac and 4 (glidescope) Grade View: Grade III Tube type: Oral Number of attempts: 2 Airway Equipment and Method: Video-laryngoscopy and Stylet (one DL Mac 4 unable to vis cords.  to glidescope mac 4 grade 3 view cords vis with cricoid press) Placement Confirmation: ETT inserted through vocal cords under direct vision,  positive ETCO2 and breath sounds checked- equal and bilateral Secured at: 21 cm Tube secured with: Tape Dental Injury: Teeth and Oropharynx as per pre-operative assessment  Difficulty Due To: Difficulty was anticipated, Difficult Airway- due to large tongue, Difficult Airway- due to reduced neck mobility, Difficult Airway- due to limited oral opening and Difficult Airway- due to anterior larynx Future Recommendations: Recommend- induction with short-acting agent, and alternative techniques readily available

## 2013-10-13 NOTE — Telephone Encounter (Signed)
Martin Martin, Thank you for this info.  I was able to call Dr. Derrell Lolling to discuss.  Dr. Derrell Lolling understands that the recent use of Avastin can introduce wound healing complications.  He has discussed that with the patient, and plans to limit the extent of incisions to optimize healing because of this concern. MM

## 2013-10-13 NOTE — Progress Notes (Signed)
Patient was asking if he should only be taking 2mg  Decadron. PCP on call was notified. Will await any new orders.

## 2013-10-13 NOTE — Progress Notes (Signed)
On arrival to PACU trying to pull NG tube. Trying to blow nose on blankets. Meds given to calm and help with feeling of "cannonball in my nose."  Asking if he is [place]. Tried to reorient to postop status and location. 1630 Settled down and apologizing for earlier behavioir. Reassured he is ok and safe.

## 2013-10-13 NOTE — Progress Notes (Signed)
[facility] PROGRESS NOTE  Martin Martin:[id number] DOB: 1956-12-19 DOA: 10/12/2013 PCP: [name], MD  Assessment/Plan  Diverticulitis of colon with perforation.   -  Continue empiric antibiotics -  S/p diagnostic laparoscopy with washout.  Perforation was not identified.   Drains were placed -  F/u body fluid culture  Glioblastoma with residual right sided weakness.   -  Continue steroids as the patient develops significant vasogenic edema with attempts to wean his dose -  Continue Keppra for seizure prophylaxis.  -  Continue Bactrim for PCP prophylaxis  Chronic steroid use -  If blood pressure drops, consider increasing steroids:  dexa 50mg  IV once followed by 25 mg IV q8h x 24 hours. -  Continue dexamethasone 2mg  IV BID .  Hyperglycemia, likely stress response.  Change to q6h insulin while NPO  Thrush, stable.  Continue Diflucan.   Diet:  NPO Access:  PIV IVF:  NS at 148ml/h Proph:  SCDs  Code Status: full Family Communication: spoke to patient alone Disposition Plan: pending recovery from surgery.  Anticipate prolonged recovery   Consultants:  General Surgery, Dr. Derrell Lolling  Procedures:  Diagnostic Laparoscopy  Antibiotics:  Primaxin 10/23 >>   HPI/Subjective:  Patient states that his abdominal pain was much better (prior to surgery).  Denies SOB, abdominal pain, nausea, vomiting.  Passing gas.    Objective: Filed Vitals:   10/13/13 1630 10/13/13 1645 10/13/13 1653 10/13/13 1700  BP: 154/86 132/91 146/90 145/90  Pulse: 74 71 93 96  Temp:    98 F (36.7 C)  TempSrc:    Oral  Resp: 23 19  14   Height:      Weight:      SpO2: 96% 97% 98% 98%    Intake/Output Summary (Last 24 hours) at 10/13/13 1750 Last data filed at 10/13/13 1630  Gross per 24 hour  Intake 4581.67 ml  Output   3150 ml  Net 1431.67 ml   Filed Weights   10/12/13 1614  Weight: 88.9 kg (195 lb 15.8 oz)    Exam:   General:  CM, No acute distress  HEENT:   NCAT, MMM  Cardiovascular:  RRR, nl S1, S2 no mrg, 2+ pulses, warm extremities  Respiratory:  CTAB, no increased WOB  Abdomen:   Hypoactive BS, soft,moderately distended, nontender to palpation  MSK:   Normal tone and bulk, no LEE  Neuro:  Grossly intact  Data Reviewed: Basic Metabolic Panel:  Recent Labs Lab 10/09/13 1300 10/12/13 1030 10/13/13 0515  NA 141 133* 134*  K 4.4 4.2 4.7  CL  --  92* 98  CO2 21* 25 25  GLUCOSE 181* 166* 164*  BUN 19.6 17 13   CREATININE 0.7 0.83 0.77  CALCIUM 9.3 9.7 9.4  MG  --   --  2.4   Liver Function Tests:  Recent Labs Lab 10/09/13 1300 10/12/13 1030  AST 30 19  ALT 124* 79*  ALKPHOS 89 94  BILITOT 0.35 0.3  PROT 6.9 7.2  ALBUMIN 2.6* 2.9*    Recent Labs Lab 10/12/13 1030  LIPASE 39   No results found for this basename: AMMONIA,  in the last 168 hours CBC:  Recent Labs Lab 10/09/13 1300 10/12/13 1030 10/13/13 0515  WBC 9.7 8.7 9.5  NEUTROABS 8.6* 7.9*  --   HGB 12.7* 12.5* 11.8*  HCT 37.6* 38.2* 35.5*  MCV 98.2* 99.0 99.4  PLT 232 233 224   Cardiac Enzymes: No results found for this basename: CKTOTAL, CKMB, CKMBINDEX, TROPONINI,  in the last 168 hours BNP (last 3 results) No results found for this basename: PROBNP,  in the last 8760 hours CBG:  Recent Labs Lab 10/13/13 0009 10/13/13 0408 10/13/13 0731 10/13/13 1201 10/13/13 1606  GLUCAP 142* 146* 133* 123* 147*    Recent Results (from the past 240 hour(s))  TECHNOLOGIST REVIEW     Status: None   Collection Time    10/09/13  1:00 PM      Result Value Range Status   Technologist Review Metas and Myelocytes present, 1% NRBC   Final  SURGICAL PCR SCREEN     Status: None   Collection Time    10/13/13 12:37 PM      Result Value Range Status   MRSA, PCR NEGATIVE  NEGATIVE Final   Staphylococcus aureus NEGATIVE  NEGATIVE Final   Comment:            The Xpert SA Assay (FDA     approved for NASAL specimens     in patients over 69 years of age),      is one component of     a comprehensive surveillance     program.  Test performance has     been validated by [employer] for patients greater     than or equal to 72 year old.     It is not intended     to diagnose infection nor to     guide or monitor treatment.     Studies: Ct Abdomen Pelvis W Contrast  10/12/2013   CLINICAL DATA:  Severe abdominal pain. History of CNS glioblastoma.  EXAM: CT ABDOMEN AND PELVIS WITH CONTRAST  TECHNIQUE: Multidetector CT imaging of the abdomen and pelvis was performed using the standard protocol following bolus administration of intravenous contrast.  CONTRAST:  OMNIPAQUE IOHEXOL 300 MG/ML  SOLN  COMPARISON:  10/11/2009  FINDINGS: Linear densities at the right lung base are suggestive for atelectasis. There is free intraperitoneal air in the left upper quadrant with air around the distal esophagus. There appears to be some air within the retroperitoneal space. The largest focus of air is around the descending colon and there is fluid or inflammation in this area. The fluid collection around the descending colon roughly measures 5.2 x 3.7 cm and could represent a site for abscess formation. Findings raise concern for acute diverticulitis in this area. Patient has extensive colonic diverticula. In addition, there is a loop of small bowel in the mid lower abdomen that demonstrates severe wall thickening measuring up to 1.1 cm. There is fluid and edema tracking from this abnormal loop of bowel into the mesentery above it. There is some fluid and small nodes throughout the central mesentery and adjacent to the duodenum.  No gross abnormality to the liver, gallbladder or portal venous system. No gross abnormality of the pancreas, spleen, adrenal glands. There is mild perinephric stranding or edema around the kidneys which appears chronic. Probable bilateral renal cysts. Calcification in the left kidney lower pole measures 4 mm and suggestive for nonobstructive  stone.  No gross abnormality to the prostate, seminal vesicles or urinary bladder. No significant free fluid in the pelvis. No acute bone abnormality.  IMPRESSION: Study is positive for free air and suggestive for a bowel perforation. The largest focus of air is surrounding the descending colon. Patient has multiple colonic diverticula and findings could represent acute diverticulitis. There is fluid around the descending colon which may represent a potential site for  abscess formation.  There is a severely thickened loop of small bowel in the mid abdomen. There is extensive edema and fluid tracking superior to this abnormal loop of bowel and towards the central mesentery. The patient is currently receiving chemotherapy and Avastin. Findings could represent a focal area of bowel ischemia from vasculitis. Findings may also associated with an infectious or inflammatory process.  Nonobstructive left kidney stone.  Renal cysts.  These results were called by telephone at the time of interpretation on 10/12/2013 at 12:27 PM to Dr. Rolland Porter , who verbally acknowledged these results.   Electronically Signed   By: Richarda Overlie M.D.   On: 10/12/2013 12:30    Scheduled Meds: . calcium carbonate  1 tablet Oral Daily  . dexamethasone  1 mg Intravenous Q12H  . [START ON 10/14/2013] enoxaparin (LOVENOX) injection  40 mg Subcutaneous Q24H  . ertapenem (INVANZ) IV  1 g Intravenous Q24H  . fentaNYL      . fentaNYL      . fluconazole (DIFLUCAN) IV  200 mg Intravenous Q24H  . insulin aspart  0-5 Units Subcutaneous QHS  . insulin aspart  0-9 Units Subcutaneous TID WC  . levETIRAcetam  500 mg Oral QHS  . metoprolol  5 mg Intravenous Q6H  . pantoprazole (PROTONIX) IV  40 mg Intravenous Q12H  . sodium chloride       Continuous Infusions: . 0.9 % NaCl with KCl 20 mEq / L    . diltiazem (CARDIZEM) infusion      Principal Problem:   Diverticulitis of colon with perforation Active Problems:   Right sided weakness    Hyperglycemia   Thrush    Time spent: 30 min    Jesi Jurgens, [facility]  [facility] Pager [phone]. If 7PM-7AM, please contact night-coverage at [email or web address], password [facility] 10/13/2013, 5:50 PM  LOS: 1 day

## 2013-10-14 ENCOUNTER — Encounter (HOSPITAL_COMMUNITY)
Admission: EM | Disposition: A | Discharge status: Hospice | Payer: Self-pay | Source: Home / Self Care | Attending: Internal Medicine

## 2013-10-14 DIAGNOSIS — K5732 Diverticulitis of large intestine without perforation or abscess without bleeding: Principal | ICD-10-CM

## 2013-10-14 DIAGNOSIS — R569 Unspecified convulsions: Secondary | ICD-10-CM

## 2013-10-14 DIAGNOSIS — Z0389 Encounter for observation for other suspected diseases and conditions ruled out: Secondary | ICD-10-CM

## 2013-10-14 LAB — GLUCOSE, CAPILLARY
Glucose-Capillary: 113 mg/dL — ABNORMAL HIGH (ref 70–99)
Glucose-Capillary: 142 mg/dL — ABNORMAL HIGH (ref 70–99)
Glucose-Capillary: 91 mg/dL (ref 70–99)

## 2013-10-14 LAB — CBC
HCT: 34.3 % — ABNORMAL LOW (ref 39.0–52.0)
MCH: 33.2 pg (ref 26.0–34.0)
MCV: 98.3 fL (ref 78.0–100.0)
Platelets: 220 10*3/uL (ref 150–400)
RBC: 3.49 MIL/uL — ABNORMAL LOW (ref 4.22–5.81)
RDW: 16.3 % — ABNORMAL HIGH (ref 11.5–15.5)

## 2013-10-14 LAB — BASIC METABOLIC PANEL
BUN: 12 mg/dL (ref 6–23)
CO2: 25 mEq/L (ref 19–32)
Calcium: 8.8 mg/dL (ref 8.4–10.5)
Creatinine, Ser: 0.69 mg/dL (ref 0.50–1.35)
GFR calc Af Amer: >90 mL/min (ref >90)
Potassium: 4.4 mEq/L (ref 3.5–5.1)

## 2013-10-14 SURGERY — COLONOSCOPY
Anesthesia: Choice

## 2013-10-14 MED ORDER — FENTANYL CITRATE 0.05 MG/ML IJ SOLN
25.0000 ug | INTRAMUSCULAR | Status: DC | PRN
  Administered 2013-10-14 (×3): 50 ug via INTRAVENOUS
  Administered 2013-10-14: 25 ug via INTRAVENOUS
  Filled 2013-10-14 (×3): qty 2

## 2013-10-14 MED ORDER — FENTANYL CITRATE 0.05 MG/ML IJ SOLN
50.0000 ug | INTRAMUSCULAR | Status: DC | PRN
  Administered 2013-10-14 – 2013-10-17 (×18): 75 ug via INTRAVENOUS
  Filled 2013-10-14 (×18): qty 2

## 2013-10-14 NOTE — Progress Notes (Addendum)
[facility] PROGRESS NOTE  Martin RACZKOWSKI RUE:[id number] DOB: [birth year] DOA: 10/12/2013 PCP: [name], MD  Assessment/Plan  Diverticulitis of colon with perforation.   -  Continue empiric antibiotics -  POD 1 s/p diagnostic laparoscopy with washout.  Perforation was not identified.   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  -  Restart Bactrim for PCP prophylaxis once tolerating PO  Chronic steroid use -  Continue dexamethasone 2mg  IV BID .  Hyperglycemia, likely stress response.   -  Continue q6h insulin while NPO  Thrush, stable.  Continue Diflucan.  CAD/HTN, blood pressures mildly elevated -  Telemetry:  HR in the 60s -  Continue diltiazem gtt while NPO -  Continue metoprolol 5mg  q6h -  Patient not on statin or ASA  Diet:  NPO Access:  PIV IVF:  NS at 115ml/h Proph:  SCDs  Code Status: full Family Communication: spoke to patient alone Disposition Plan: pending recovery from surgery.  Anticipate prolonged recovery   Consultants:  General Surgery, Dr. Derrell Lolling  Procedures:  Diagnostic Laparoscopy  Antibiotics:  Primaxin 10/23 >>   HPI/Subjective:  Patient states that his bilateral lower quadrant abdominal pain has kept him awake overnight and the pain medication helps but he is unsure if he needs more.  Denies nausea and vomiting.  Has not passed gas.   Objective: Filed Vitals:   10/14/13 0400 10/14/13 0500 10/14/13 0600 10/14/13 0700  BP: 141/96 140/90 133/92 152/93  Pulse: 70 69 67 71  Temp: 98.5 F (36.9 C)     TempSrc: Oral     Resp: 9 27 10 17   Height:      Weight: 90.7  kg (199 lb 15.3 oz)     SpO2: 98% 97% 97% 98%    Intake/Output Summary (Last 24 hours) at 10/14/13 0810 Last data filed at 10/14/13 0700  Gross per 24 hour  Intake 4391.08 ml  Output   3340 ml  Net 1051.08 ml   Filed Weights   10/12/13 1614 10/13/13 1700 10/14/13 0400  Weight: 88.9 kg (195 lb 15.8 oz) 90.6 kg (199 lb 11.8 oz) 90.7 kg (199 lb 15.3 oz)    Exam:   General:  CM, No acute distress, NG tube in place  HEENT:  NCAT, MMM  Cardiovascular:  RRR, nl S1, S2 no mrg, 2+ pulses, warm extremities  Respiratory:  CTAB, no increased WOB  Abdomen:   Absent BS, soft, moderately distended, TTP diffusely but worse in the bilateral lower quadrants and just below the umbilicus without rebound or guarding.  Drains without fluid currently but recently emptied  MSK:   Normal tone and bulk, no LEE  Neuro:  Grossly intact  Data Reviewed: Basic Metabolic Panel:  Recent Labs Lab 10/09/13 1300 10/12/13 1030 10/13/13 0515 10/14/13 0310  NA 141 133* 134* 135  K 4.4 4.2 4.7 4.4  CL  --  92* 98 99  CO2 21* 25 25 25   GLUCOSE 181* 166* 164* 145*  BUN 19.6 17 13 12   CREATININE 0.7 0.83 0.77 0.69  CALCIUM 9.3 9.7 9.4 8.8  MG  --   --  2.4  --    Liver Function Tests:  Recent Labs Lab 10/09/13 1300 10/12/13 1030  AST 30 19  ALT 124* 79*  ALKPHOS 89 94  BILITOT 0.35 0.3  PROT 6.9 7.2  ALBUMIN 2.6* 2.9*    Recent Labs Lab 10/12/13 1030  LIPASE 39   No results found for this basename: AMMONIA,  in the last 168 hours CBC:  Recent Labs Lab 10/09/13 1300 10/12/13 1030 10/13/13 0515 10/14/13 0310  WBC 9.7 8.7 9.5 9.9  NEUTROABS 8.6* 7.9*  --   --   HGB 12.7* 12.5* 11.8* 11.6*  HCT 37.6* 38.2* 35.5* 34.3*  MCV 98.2* 99.0 99.4 98.3  PLT 232 233 224 220   Cardiac Enzymes: No results found for this basename: CKTOTAL, CKMB, CKMBINDEX, TROPONINI,  in the last 168 hours BNP (last 3 results) No results found for this basename: PROBNP,  in the last 8760  hours CBG:  Recent Labs Lab 10/13/13 0731 10/13/13 1201 10/13/13 1606 10/14/13 0046 10/14/13 0603  GLUCAP 133* 123* 147* 142* 121*    Recent Results (from the past 240 hour(s))  TECHNOLOGIST REVIEW     Status: None   Collection Time    10/09/13  1:00 PM      Result Value Range Status   Technologist Review Metas and Myelocytes present, 1% NRBC   Final  SURGICAL PCR SCREEN     Status: None   Collection Time    10/13/13 12:37 PM      Result Value Range Status   MRSA, PCR NEGATIVE  NEGATIVE Final   Staphylococcus aureus NEGATIVE  NEGATIVE Final   Comment:            The Xpert SA Assay (FDA     approved for NASAL specimens     in patients over 81 years of age),     is one component of     a comprehensive surveillance     program.  Test performance has     been validated by [employer] for patients greater     than or equal to 49 year old.     It is not intended     to diagnose infection nor to     guide or monitor treatment.  BODY FLUID CULTURE     Status: None   Collection Time    10/13/13  2:51 PM      Result Value Range Status   Specimen Description FLUID ADBOMINAL   Final   Special Requests NONE   Final   Gram Stain     Final   Value: MODERATE WBC PRESENT, PREDOMINANTLY PMN     NO ORGANISMS SEEN     Performed at [facility]   Culture PENDING   Incomplete   Report Status PENDING   Incomplete     Studies: Ct Abdomen Pelvis W Contrast  10/12/2013   CLINICAL DATA:  Severe abdominal pain. History of CNS glioblastoma.  EXAM: CT ABDOMEN AND PELVIS WITH CONTRAST  TECHNIQUE: Multidetector CT imaging of the abdomen and pelvis was performed using the standard protocol following bolus administration of intravenous contrast.  CONTRAST:  OMNIPAQUE IOHEXOL 300 MG/ML  SOLN  COMPARISON:  10/11/2009  FINDINGS: Linear densities at the right lung base are suggestive for atelectasis. There is free intraperitoneal air in the left upper quadrant with air around the  distal esophagus. There appears to be some air within the retroperitoneal space. The largest focus of air is  around the descending colon and there is fluid or inflammation in this area. The fluid collection around the descending colon roughly measures 5.2 x 3.7 cm and could represent a site for abscess formation. Findings raise concern for acute diverticulitis in this area. Patient has extensive colonic diverticula. In addition, there is a loop of small bowel in the mid lower abdomen that demonstrates severe wall thickening measuring up to 1.1 cm. There is fluid and edema tracking from this abnormal loop of bowel into the mesentery above it. There is some fluid and small nodes throughout the central mesentery and adjacent to the duodenum.  No gross abnormality to the liver, gallbladder or portal venous system. No gross abnormality of the pancreas, spleen, adrenal glands. There is mild perinephric stranding or edema around the kidneys which appears chronic. Probable bilateral renal cysts. Calcification in the left kidney lower pole measures 4 mm and suggestive for nonobstructive stone.  No gross abnormality to the prostate, seminal vesicles or urinary bladder. No significant free fluid in the pelvis. No acute bone abnormality.  IMPRESSION: Study is positive for free air and suggestive for a bowel perforation. The largest focus of air is surrounding the descending colon. Patient has multiple colonic diverticula and findings could represent acute diverticulitis. There is fluid around the descending colon which may represent a potential site for abscess formation.  There is a severely thickened loop of small bowel in the mid abdomen. There is extensive edema and fluid tracking superior to this abnormal loop of bowel and towards the central mesentery. The patient is currently receiving chemotherapy and Avastin. Findings could represent a focal area of bowel ischemia from vasculitis. Findings may also associated with an  infectious or inflammatory process.  Nonobstructive left kidney stone.  Renal cysts.  These results were called by telephone at the time of interpretation on 10/12/2013 at 12:27 PM to Dr. Rolland Porter , who verbally acknowledged these results.   Electronically Signed   By: Richarda Overlie M.D.   On: 10/12/2013 12:30    Scheduled Meds: . calcium carbonate  1 tablet Oral Daily  . dexamethasone  2 mg Intravenous Q12H  . enoxaparin (LOVENOX) injection  40 mg Subcutaneous Q24H  . ertapenem (INVANZ) IV  1 g Intravenous Q24H  . fluconazole (DIFLUCAN) IV  200 mg Intravenous Q24H  . insulin aspart  0-9 Units Subcutaneous Q6H  . levETIRAcetam  500 mg Intravenous Q24H  . metoprolol  5 mg Intravenous Q6H  . pantoprazole (PROTONIX) IV  40 mg Intravenous Q12H   Continuous Infusions: . 0.9 % NaCl with KCl 20 mEq / L 125 mL/hr at 10/13/13 1829  . diltiazem (CARDIZEM) infusion 3 mg/hr (10/13/13 1823)    Principal Problem:   Diverticulitis of colon with perforation Active Problems:   Right sided weakness   Hyperglycemia   Thrush    Time spent: 30 min    Zackrey Dyar, [facility]  [facility] Pager [phone]. If 7PM-7AM, please contact night-coverage at [email or web address], password [facility] 10/14/2013, 8:10 AM  LOS: 2 days

## 2013-10-14 NOTE — Progress Notes (Signed)
1 Day Post-Op  Subjective: Feels about the same.  Asking for NG out.  No flatus or BM  Objective: Vital signs in last 24 hours: Temp:  [97.4 F (36.3 C)-98.5 F (36.9 C)] 97.7 F (36.5 C) (10/25 0800) Pulse Rate:  [67-102] 77 (10/25 0800) Resp:  [9-27] 17 (10/25 0800) BP: (120-155)/(72-100) 153/100 mmHg (10/25 0800) SpO2:  [96 %-98 %] 96 % (10/25 0800) Weight:  [199 lb 11.8 oz (90.6 kg)-199 lb 15.3 oz (90.7 kg)] 199 lb 15.3 oz (90.7 kg) (10/25 0400) Last BM Date: 10/11/13  Intake/Output from previous day: 10/24 0701 - 10/25 0700 In: 4391.1 [I.V.:4166.1; NG/GT:100; IV Piggyback:100] Out: 3340 [Urine:2980; Emesis/NG output:100; Drains:260] Intake/Output this shift: Total I/O In: 125 [I.V.:125] Out: -   General appearance: alert, cooperative and no distress Resp: clear to auscultation bilaterally Cardio: normal rate, regular GI: soft, appropriate tenderness, moderate distension, +tympany, JP's with thin serous output, no peritoneal signs Extremities: SCD's bilat  Lab Results:   Recent Labs  10/13/13 0515 10/14/13 0310  WBC 9.5 9.9  HGB 11.8* 11.6*  HCT 35.5* 34.3*  PLT 224 220   BMET  Recent Labs  10/13/13 0515 10/14/13 0310  NA 134* 135  K 4.7 4.4  CL 98 99  CO2 25 25  GLUCOSE 164* 145*  BUN 13 12  CREATININE 0.77 0.69  CALCIUM 9.4 8.8   PT/INR  Recent Labs  10/12/13 1555 10/13/13 0515  LABPROT 12.9 13.1  INR 0.99 1.01   ABG No results found for this basename: PHART, PCO2, PO2, HCO3,  in the last 72 hours  Studies/Results: Ct Abdomen Pelvis W Contrast  10/12/2013   CLINICAL DATA:  Severe abdominal pain. History of CNS glioblastoma.  EXAM: CT ABDOMEN AND PELVIS WITH CONTRAST  TECHNIQUE: Multidetector CT imaging of the abdomen and pelvis was performed using the standard protocol following bolus administration of intravenous contrast.  CONTRAST:  OMNIPAQUE IOHEXOL 300 MG/ML  SOLN  COMPARISON:  10/11/2009  FINDINGS: Linear densities at the  right lung base are suggestive for atelectasis. There is free intraperitoneal air in the left upper quadrant with air around the distal esophagus. There appears to be some air within the retroperitoneal space. The largest focus of air is around the descending colon and there is fluid or inflammation in this area. The fluid collection around the descending colon roughly measures 5.2 x 3.7 cm and could represent a site for abscess formation. Findings raise concern for acute diverticulitis in this area. Patient has extensive colonic diverticula. In addition, there is a loop of small bowel in the mid lower abdomen that demonstrates severe wall thickening measuring up to 1.1 cm. There is fluid and edema tracking from this abnormal loop of bowel into the mesentery above it. There is some fluid and small nodes throughout the central mesentery and adjacent to the duodenum.  No gross abnormality to the liver, gallbladder or portal venous system. No gross abnormality of the pancreas, spleen, adrenal glands. There is mild perinephric stranding or edema around the kidneys which appears chronic. Probable bilateral renal cysts. Calcification in the left kidney lower pole measures 4 mm and suggestive for nonobstructive stone.  No gross abnormality to the prostate, seminal vesicles or urinary bladder. No significant free fluid in the pelvis. No acute bone abnormality.  IMPRESSION: Study is positive for free air and suggestive for a bowel perforation. The largest focus of air is surrounding the descending colon. Patient has multiple colonic diverticula and findings could represent acute diverticulitis. There  is fluid around the descending colon which may represent a potential site for abscess formation.  There is a severely thickened loop of small bowel in the mid abdomen. There is extensive edema and fluid tracking superior to this abnormal loop of bowel and towards the central mesentery. The patient is currently receiving  chemotherapy and Avastin. Findings could represent a focal area of bowel ischemia from vasculitis. Findings may also associated with an infectious or inflammatory process.  Nonobstructive left kidney stone.  Renal cysts.  These results were called by telephone at the time of interpretation on 10/12/2013 at 12:27 PM to Dr. Rolland Porter , who verbally acknowledged these results.   Electronically Signed   By: Richarda Overlie M.D.   On: 10/12/2013 12:30    Anti-infectives: Anti-infectives   Start     Dose/Rate Route Frequency Ordered Stop   10/13/13 1800  fluconazole (DIFLUCAN) IVPB 200 mg     200 mg 100 mL/hr over 60 Minutes Intravenous Every 24 hours 10/13/13 1707 10/23/13 1759   10/13/13 1715  ertapenem (INVANZ) 1 g in sodium chloride 0.9 % 50 mL IVPB  Status:  Discontinued     1 g 100 mL/hr over 30 Minutes Intravenous Every 24 hours 10/13/13 1707 10/13/13 1750   10/12/13 1600  fluconazole (DIFLUCAN) tablet 100 mg  Status:  Discontinued     100 mg Oral Daily 10/12/13 1428 10/13/13 1707   10/12/13 1600  sulfamethoxazole-trimethoprim (BACTRIM DS) 800-160 MG per tablet 1 tablet  Status:  Discontinued     1 tablet Oral Daily 10/12/13 1428 10/13/13 1707   10/12/13 1600  ertapenem (INVANZ) 1 g in sodium chloride 0.9 % 50 mL IVPB     1 g 100 mL/hr over 30 Minutes Intravenous Every 24 hours 10/12/13 1507        Assessment/Plan: s/p Procedure(s): DIAGNOSTIC LAPAROSCOPY and Placement of Drains (N/A) He looks stable.  He says that his pain is about the same as preop.  He describes this as lower abdominal "soreness".  JPs serous output.  HR normal and wbc normal.  No evidence of free perforation.  would continue NG to suction since still distended and tympanitic, awaiting bowel function.  Would also leave NG and continue NPO until we can be sure that he is improving and no free perforation, especially since there was no obvious source identified.  Keep in ICU for monitoring.    LOS: 2 days    Lodema Pilot  DAVID 10/14/2013

## 2013-10-15 DIAGNOSIS — R7309 Other abnormal glucose: Secondary | ICD-10-CM

## 2013-10-15 LAB — CBC
HCT: 35.7 % — ABNORMAL LOW (ref 39.0–52.0)
MCHC: 33.1 g/dL (ref 30.0–36.0)
Platelets: 213 10*3/uL (ref 150–400)
RBC: 3.63 MIL/uL — ABNORMAL LOW (ref 4.22–5.81)
RDW: 16.2 % — ABNORMAL HIGH (ref 11.5–15.5)
WBC: 10 10*3/uL (ref 4.0–10.5)

## 2013-10-15 LAB — BASIC METABOLIC PANEL
Chloride: 95 mEq/L — ABNORMAL LOW (ref 96–112)
GFR calc Af Amer: >90 mL/min (ref >90)
GFR calc non Af Amer: >90 mL/min (ref >90)
Potassium: 4.3 mEq/L (ref 3.5–5.1)
Sodium: 130 mEq/L — ABNORMAL LOW (ref 135–145)

## 2013-10-15 LAB — GLUCOSE, CAPILLARY: Glucose-Capillary: 88 mg/dL (ref 70–99)

## 2013-10-15 MED ORDER — METOPROLOL TARTRATE 1 MG/ML IV SOLN
10.0000 mg | Freq: Four times a day (QID) | INTRAVENOUS | Status: DC
  Administered 2013-10-15 – 2013-10-29 (×55): 10 mg via INTRAVENOUS
  Filled 2013-10-15 (×62): qty 10

## 2013-10-15 MED ORDER — HYDRALAZINE HCL 20 MG/ML IJ SOLN
10.0000 mg | Freq: Four times a day (QID) | INTRAMUSCULAR | Status: DC | PRN
  Filled 2013-10-15: qty 1

## 2013-10-15 NOTE — Progress Notes (Signed)
2 Days Post-Op  Subjective: Feels better.  Denies abdominal pain.  Objective: Vital signs in last 24 hours: Temp:  [98.1 F (36.7 C)-98.6 F (37 C)] 98.1 F (36.7 C) (10/26 0800) Pulse Rate:  [67-105] 73 (10/26 0900) Resp:  [9-31] 10 (10/26 0900) BP: (139-163)/(86-109) 143/97 mmHg (10/26 0900) SpO2:  [91 %-100 %] 98 % (10/26 0900) Weight:  [198 lb 3.1 oz (89.9 kg)] 198 lb 3.1 oz (89.9 kg) (10/26 0400) Last BM Date: 10/11/13  Intake/Output from previous day: 10/25 0701 - 10/26 0700 In: 3223 [I.V.:2783; NG/GT:190; IV Piggyback:200] Out: 3505 [Urine:3350; Emesis/NG output:125; Drains:30] Intake/Output this shift: Total I/O In: 420 [I.V.:420] Out: -   General appearance: alert, cooperative and no distress GI: soft, NT, moderate distension, wounds okay, no peritoneal signs, LUQ JP with milky/purulent output but the others look okay.   not much NG output recorded but dark green bilious output in canister  Lab Results:   Recent Labs  10/14/13 0310 10/15/13 0340  WBC 9.9 10.0  HGB 11.6* 11.8*  HCT 34.3* 35.7*  PLT 220 213   BMET  Recent Labs  10/14/13 0310 10/15/13 0340  NA 135 130*  K 4.4 4.3  CL 99 95*  CO2 25 23  GLUCOSE 145* 109*  BUN 12 16  CREATININE 0.69 0.68  CALCIUM 8.8 9.0   PT/INR  Recent Labs  10/12/13 1555 10/13/13 0515  LABPROT 12.9 13.1  INR 0.99 1.01   ABG No results found for this basename: PHART, PCO2, PO2, HCO3,  in the last 72 hours  Studies/Results: No results found.  Anti-infectives: Anti-infectives   Start     Dose/Rate Route Frequency Ordered Stop   10/13/13 1800  fluconazole (DIFLUCAN) IVPB 200 mg     200 mg 100 mL/hr over 60 Minutes Intravenous Every 24 hours 10/13/13 1707 10/23/13 1759   10/13/13 1715  ertapenem (INVANZ) 1 g in sodium chloride 0.9 % 50 mL IVPB  Status:  Discontinued     1 g 100 mL/hr over 30 Minutes Intravenous Every 24 hours 10/13/13 1707 10/13/13 1750   10/12/13 1600  fluconazole (DIFLUCAN) tablet 100  mg  Status:  Discontinued     100 mg Oral Daily 10/12/13 1428 10/13/13 1707   10/12/13 1600  sulfamethoxazole-trimethoprim (BACTRIM DS) 800-160 MG per tablet 1 tablet  Status:  Discontinued     1 tablet Oral Daily 10/12/13 1428 10/13/13 1707   10/12/13 1600  ertapenem (INVANZ) 1 g in sodium chloride 0.9 % 50 mL IVPB     1 g 100 mL/hr over 30 Minutes Intravenous Every 24 hours 10/12/13 1507        Assessment/Plan: s/p Procedure(s): DIAGNOSTIC LAPAROSCOPY and Placement of Drains (N/A) He looks good this morning.  his abdominal exam looks fine but his LUQ drain is thickening up.  it does not appear to be enteral contents but more purulent.  He is still distended and NG output green.  I would continue NPO for now.  Likely repeat CT with enteral contrast on Tues??  LOS: 3 days    Martin Martin 10/15/2013

## 2013-10-15 NOTE — Progress Notes (Addendum)
[facility] PROGRESS NOTE  Martin Martin AVW:[id number] DOB: [birth year] DOA: 10/12/2013 PCP: [name], MD  Assessment/Plan  Diverticulitis of colon with perforation.   -  Continue empiric antibiotics -  POD 2 s/p diagnostic laparoscopy with washout.  Perforation was not identified.   Drains were placed, 30mL out -  Body fluid culture:  E. 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  -  Restart Bactrim for PCP prophylaxis once tolerating PO  Chronic steroid use.   Continue dexamethasone 2mg  IV BID .  Hyperglycemia, likely stress response.  Continue q6h insulin while NPO  Thrush, stable.  Continue Diflucan.  CAD/HTN, blood pressures mildly elevated -  Telemetry:  HR in the 60s -  Continue diltiazem gtt while NPO -  Increase to metoprolol 10 mg q6h -  Patient not on statin or ASA  Hyponatremia and hypochloremia, may be markers of dehydration and fluids shifts. BUN rose slightly also -  Continue NS at and repeat tomorrow  Mild anemia likely secondary to acute illness. Hgb stable.  Repeat weekly or sooner if needed  Diet:  NPO - if prolonged bowel rest anticipated, should consider TPN Access:  PIV IVF:  NS at 143ml/h Proph:  SCDs  Code Status: full Family Communication: spoke to patient alone Disposition Plan: pending recovery from surgery.  Anticipate prolonged recovery   Consultants:  General Surgery, Dr. Derrell Lolling  Procedures:  Diagnostic Laparoscopy  Antibiotics:  Primaxin 10/23 >>   HPI/Subjective:  Patient states that his abdomen feels much better.  Denies nausea and vomiting.  Has not passed gas.   Objective: Filed Vitals:   10/15/13 1300 10/15/13 1400 10/15/13 1500 10/15/13 1600  BP: 147/96 150/111 157/97  136/110  Pulse: 80 78 97 105  Temp:    98.6 F (37 C)  TempSrc:    Oral  Resp: 11 13 8 9   Height:      Weight:      SpO2: 94% 94% 94% 96%    Intake/Output Summary (Last 24 hours) at 10/15/13 1718 Last data filed at 10/15/13 1600  Gross per 24 hour  Intake   3313 ml  Output   3538 ml  Net   -225 ml   Filed Weights   10/13/13 1700 10/14/13 0400 10/15/13 0400  Weight: 90.6 kg (199 lb 11.8 oz) 90.7 kg (199 lb 15.3 oz) 89.9 kg (198 lb 3.1 oz)    Exam:   General:  CM, No acute distress, NG tube in place  HEENT:  NCAT, MMM  Cardiovascular:  RRR, nl S1, S2 no mrg, 2+ pulses, warm extremities  Respiratory:  CTAB, no increased WOB  Abdomen:   + BS, soft, moderately distended, TTP in the inferior quadrants without rebound or guarding.  Drains in place  MSK:   Normal tone and bulk, no LEE  Neuro:  Grossly intact  Data Reviewed: Basic Metabolic Panel:  Recent Labs Lab 10/09/13 1300 10/12/13 1030 10/13/13 0515 10/14/13 0310 10/15/13 0340  NA 141 133* 134* 135 130*  K 4.4 4.2 4.7 4.4 4.3  CL  --  92* 98 99 95*  CO2 21* 25 25 25 23   GLUCOSE 181* 166* 164* 145* 109*  BUN 19.6 17 13 12 16   CREATININE 0.7 0.83 0.77 0.69 0.68  CALCIUM 9.3 9.7 9.4 8.8 9.0  MG  --   --  2.4  --   --    Liver Function Tests:  Recent Labs Lab 10/09/13 1300 10/12/13 1030  AST 30 19  ALT 124* 79*  ALKPHOS 89 94  BILITOT 0.35 0.3  PROT 6.9 7.2  ALBUMIN 2.6* 2.9*    Recent Labs Lab 10/12/13 1030  LIPASE 39   No results found for this basename: AMMONIA,  in the last 168 hours CBC:  Recent Labs Lab 10/09/13 1300 10/12/13 1030 10/13/13 0515 10/14/13 0310 10/15/13 0340  WBC 9.7 8.7 9.5 9.9 10.0  NEUTROABS 8.6* 7.9*  --   --   --   HGB 12.7* 12.5* 11.8* 11.6* 11.8*  HCT 37.6* 38.2* 35.5* 34.3* 35.7*  MCV 98.2* 99.0 99.4 98.3 98.3  PLT 232 233 224 220 213   Cardiac Enzymes: No results found for this basename: CKTOTAL, CKMB, CKMBINDEX, TROPONINI,  in the last 168  hours BNP (last 3 results) No results found for this basename: PROBNP,  in the last 8760 hours CBG:  Recent Labs Lab 10/14/13 1324 10/14/13 1806 10/14/13 2355 10/15/13 0527 10/15/13 1122  GLUCAP 113* 91 88 101* 101*    Recent Results (from the past 240 hour(s))  TECHNOLOGIST REVIEW     Status: None   Collection Time    10/09/13  1:00 PM      Result Value Range Status   Technologist Review Metas and Myelocytes present, 1% NRBC   Final  SURGICAL PCR SCREEN     Status: None   Collection Time    10/13/13 12:37 PM      Result Value Range Status   MRSA, PCR NEGATIVE  NEGATIVE Final   Staphylococcus aureus NEGATIVE  NEGATIVE Final   Comment:            The Xpert SA Assay (FDA     approved for NASAL specimens     in patients over 14 years of age),     is one component of     a comprehensive surveillance     program.  Test performance has     been validated by [employer] for patients greater     than or equal to 46 year old.     It is not intended     to diagnose infection nor to     guide or monitor treatment.  ANAEROBIC CULTURE     Status: None   Collection Time    10/13/13  2:51 PM      Result Value Range Status   Specimen Description FLUID ADBOMINAL   Final   Special Requests NONE   Final   Gram Stain     Final   Value: ABUNDANT WBC PRESENT,BOTH PMN AND MONONUCLEAR     NO SQUAMOUS EPITHELIAL CELLS SEEN     NO ORGANISMS SEEN     Performed at [facility]   Culture     Final   Value: NO ANAEROBES ISOLATED; CULTURE IN PROGRESS FOR 5 DAYS     Performed at [facility]   Report Status PENDING   Incomplete  BODY FLUID CULTURE     Status: None   Collection Time    10/13/13  2:51 PM      Result Value Range Status   Specimen Description FLUID ADBOMINAL   Final   Special Requests NONE   Final   Gram Stain     Final  Value: MODERATE WBC PRESENT, PREDOMINANTLY PMN     NO ORGANISMS SEEN     Performed at [facility]   Culture     Final    Value: MODERATE ESCHERICHIA COLI     Performed at [facility]   Report Status PENDING   Incomplete     Studies: No results found.  Scheduled Meds: . calcium carbonate  1 tablet Oral Daily  . dexamethasone  2 mg Intravenous Q12H  . enoxaparin (LOVENOX) injection  40 mg Subcutaneous Q24H  . ertapenem (INVANZ) IV  1 g Intravenous Q24H  . fluconazole (DIFLUCAN) IV  200 mg Intravenous Q24H  . insulin aspart  0-9 Units Subcutaneous Q6H  . levETIRAcetam  500 mg Intravenous Q24H  . metoprolol  5 mg Intravenous Q6H  . pantoprazole (PROTONIX) IV  40 mg Intravenous Q12H   Continuous Infusions: . 0.9 % NaCl with KCl 20 mEq / L 125 mL/hr at 10/15/13 1352  . diltiazem (CARDIZEM) infusion 3 mg/hr (10/14/13 1800)    Principal Problem:   Diverticulitis of colon with perforation Active Problems:   Right sided weakness   Hyperglycemia   Thrush    Time spent: 30 min    Chloe Baig, [facility]  [facility] Pager [phone]. If 7PM-7AM, please contact night-coverage at [email or web address], password [facility] 10/15/2013, 5:18 PM  LOS: 3 days

## 2013-10-16 ENCOUNTER — Inpatient Hospital Stay (HOSPITAL_COMMUNITY): Payer: BC Managed Care – PPO

## 2013-10-16 ENCOUNTER — Other Ambulatory Visit: Payer: BC Managed Care – PPO

## 2013-10-16 ENCOUNTER — Encounter (HOSPITAL_COMMUNITY): Payer: Self-pay | Admitting: General Surgery

## 2013-10-16 LAB — GLUCOSE, CAPILLARY: Glucose-Capillary: 85 mg/dL (ref 70–99)

## 2013-10-16 LAB — BODY FLUID CULTURE

## 2013-10-16 LAB — BASIC METABOLIC PANEL
BUN: 17 mg/dL (ref 6–23)
CO2: 23 mEq/L (ref 19–32)
Chloride: 97 mEq/L (ref 96–112)
Creatinine, Ser: 0.63 mg/dL (ref 0.50–1.35)
Glucose, Bld: 105 mg/dL — ABNORMAL HIGH (ref 70–99)

## 2013-10-16 MED ORDER — SODIUM CHLORIDE 0.9 % IJ SOLN
10.0000 mL | INTRAMUSCULAR | Status: DC | PRN
  Administered 2013-10-18 – 2013-10-23 (×11): 10 mL
  Administered 2013-10-24: 15:00:00 30 mL
  Administered 2013-10-24 – 2013-10-26 (×4): 10 mL

## 2013-10-16 MED ORDER — IOHEXOL 300 MG/ML  SOLN
50.0000 mL | Freq: Once | INTRAMUSCULAR | Status: AC | PRN
  Administered 2013-10-16: 50 mL via ORAL

## 2013-10-16 MED ORDER — CLONIDINE HCL 0.1 MG/24HR TD PTWK
0.1000 mg | MEDICATED_PATCH | TRANSDERMAL | Status: DC
  Administered 2013-10-16 – 2013-10-30 (×3): 0.1 mg via TRANSDERMAL
  Filled 2013-10-16 (×4): qty 1

## 2013-10-16 MED ORDER — IOHEXOL 300 MG/ML  SOLN
100.0000 mL | Freq: Once | INTRAMUSCULAR | Status: AC | PRN
  Administered 2013-10-16: 100 mL via INTRAVENOUS

## 2013-10-16 MED ORDER — LORAZEPAM 2 MG/ML IJ SOLN
0.5000 mg | Freq: Three times a day (TID) | INTRAMUSCULAR | Status: DC | PRN
  Administered 2013-10-21: 0.5 mg via INTRAVENOUS
  Filled 2013-10-16: qty 1

## 2013-10-16 MED ORDER — FAT EMULSION 20 % IV EMUL
250.0000 mL | INTRAVENOUS | Status: AC
  Administered 2013-10-16: 250 mL via INTRAVENOUS
  Filled 2013-10-16: qty 250

## 2013-10-16 MED ORDER — TRACE MINERALS CR-CU-F-FE-I-MN-MO-SE-ZN IV SOLN
INTRAVENOUS | Status: AC
  Administered 2013-10-16: 18:00:00 via INTRAVENOUS
  Filled 2013-10-16: qty 1000

## 2013-10-16 MED ORDER — CHLORHEXIDINE GLUCONATE 0.12 % MT SOLN
15.0000 mL | Freq: Two times a day (BID) | OROMUCOSAL | Status: DC
  Administered 2013-10-17 – 2013-11-02 (×32): 15 mL via OROMUCOSAL
  Filled 2013-10-16 (×37): qty 15

## 2013-10-16 MED ORDER — SODIUM CHLORIDE 0.9 % IJ SOLN
10.0000 mL | Freq: Two times a day (BID) | INTRAMUSCULAR | Status: DC
  Administered 2013-10-16 – 2013-10-26 (×5): 10 mL

## 2013-10-16 MED ORDER — POTASSIUM CHLORIDE IN NACL 20-0.9 MEQ/L-% IV SOLN
INTRAVENOUS | Status: AC
  Administered 2013-10-16 – 2013-10-17 (×3): via INTRAVENOUS
  Filled 2013-10-16 (×2): qty 1000

## 2013-10-16 MED ORDER — BIOTENE DRY MOUTH MT LIQD
15.0000 mL | Freq: Two times a day (BID) | OROMUCOSAL | Status: DC
  Administered 2013-10-17 – 2013-11-02 (×30): 15 mL via OROMUCOSAL

## 2013-10-16 NOTE — Progress Notes (Signed)
Dr. Llana Aliment called me with results of plain films.  He requested CT abdomen to evaluate for possible retroperitoneal abscess or perforation.

## 2013-10-16 NOTE — Progress Notes (Signed)
Patient ID: Martin Martin, male   DOB: [birth year], 57 y.o.   MRN: [id number] 3 Days Post-Op  Subjective: Pt is frustrated and is wondering when he can eat and have his NGT removed etc.  He is passing a small amount of flatus, but feels more distended today than he was yesterday.  Objective: Vital signs in last 24 hours: Temp:  [97.6 F (36.4 C)-98.6 F (37 C)] 97.6 F (36.4 C) (10/27 0757) Pulse Rate:  [45-116] 77 (10/27 0800) Resp:  [8-23] 17 (10/27 0800) BP: (135-168)/(89-119) 158/107 mmHg (10/27 0800) SpO2:  [88 %-98 %] 95 % (10/27 0800) Weight:  [197 lb 5 oz (89.5 kg)] 197 lb 5 oz (89.5 kg) (10/27 0400) Last BM Date: 10/12/13  Intake/Output from previous day: 10/26 0701 - 10/27 0700 In: 3686 [I.V.:3236; IV Piggyback:450] Out: 3613 [Urine:3300; Emesis/NG output:250; Drains:63] Intake/Output this shift: Total I/O In: 125 [I.V.:125] Out: 175 [Urine:175]  PE: Abd: soft, but distended, minimal abdominal pain, very hypoactive BS this am, JP 1 and 2 with just minimal serous output, JP 3 which is the most lateral superior drain with milky thick drainage, but minimal, incisions all c/d/i  Lab Results:   Recent Labs  10/14/13 0310 10/15/13 0340  WBC 9.9 10.0  HGB 11.6* 11.8*  HCT 34.3* 35.7*  PLT 220 213   BMET  Recent Labs  10/15/13 0340 10/16/13 0404  NA 130* 134*  K 4.3 4.3  CL 95* 97  CO2 23 23  GLUCOSE 109* 105*  BUN 16 17  CREATININE 0.68 0.63  CALCIUM 9.0 8.7   PT/INR No results found for this basename: LABPROT, INR,  in the last 72 hours CMP     Component Value Date/Time   NA 134* 10/16/2013 0404   NA 141 10/09/2013 1300   K 4.3 10/16/2013 0404   K 4.4 10/09/2013 1300   CL 97 10/16/2013 0404   CL 102 06/05/2013 1406   CO2 23 10/16/2013 0404   CO2 21* 10/09/2013 1300   GLUCOSE 105* 10/16/2013 0404   GLUCOSE 181* 10/09/2013 1300   GLUCOSE 165* 06/05/2013 1406   BUN 17 10/16/2013 0404   BUN 19.6 10/09/2013 1300   CREATININE 0.63 10/16/2013 0404    CREATININE 0.7 10/09/2013 1300   CALCIUM 8.7 10/16/2013 0404   CALCIUM 9.3 10/09/2013 1300   PROT 7.2 10/12/2013 1030   PROT 6.9 10/09/2013 1300   ALBUMIN 2.9* 10/12/2013 1030   ALBUMIN 2.6* 10/09/2013 1300   AST 19 10/12/2013 1030   AST 30 10/09/2013 1300   ALT 79* 10/12/2013 1030   ALT 124* 10/09/2013 1300   ALKPHOS 94 10/12/2013 1030   ALKPHOS 89 10/09/2013 1300   BILITOT 0.3 10/12/2013 1030   BILITOT 0.35 10/09/2013 1300   GFRNONAA >90 10/16/2013 0404   GFRAA >90 10/16/2013 0404   Lipase     Component Value Date/Time   LIPASE 39 10/12/2013 1030       Studies/Results: No results found.  Anti-infectives: Anti-infectives   Start     Dose/Rate Route Frequency Ordered Stop   10/13/13 1800  fluconazole (DIFLUCAN) IVPB 200 mg     200 mg 100 mL/hr over 60 Minutes Intravenous Every 24 hours 10/13/13 1707 10/23/13 1759   10/13/13 1715  ertapenem (INVANZ) 1 g in sodium chloride 0.9 % 50 mL IVPB  Status:  Discontinued     1 g 100 mL/hr over 30 Minutes Intravenous Every 24 hours 10/13/13 1707 10/13/13 1750   10/12/13 1600  fluconazole (DIFLUCAN) tablet  100 mg  Status:  Discontinued     100 mg Oral Daily 10/12/13 1428 10/13/13 1707   10/12/13 1600  sulfamethoxazole-trimethoprim (BACTRIM DS) 800-160 MG per tablet 1 tablet  Status:  Discontinued     1 tablet Oral Daily 10/12/13 1428 10/13/13 1707   10/12/13 1600  ertapenem (INVANZ) 1 g in sodium chloride 0.9 % 50 mL IVPB     1 g 100 mL/hr over 30 Minutes Intravenous Every 24 hours 10/12/13 1507         Assessment/Plan  1. Perforated viscus of unknown etiology, presumed diverticulitis, but not seen with dx lap 2. Dx lap with abdominal washout and placement of 3 JP drains 3. Glioblastoma on Avastin and steroids 4. Post op ileus Patient Active Problem List   Diagnosis Date Noted  . Hyperglycemia 10/12/2013  . Diverticulitis of colon with perforation 10/12/2013  . Thrush 10/12/2013  . Right sided weakness 09/08/2013  .  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 Focal seizure 03/24/2013  . Hypertension   . Coronary artery disease (CAD) excluded    Plan: 1.  Despite not putting much out of his NGT, he has minimal BS and still with pretty decent abdominal distention.  Will continued NGT for now. 2. D/w Dr. Malachi Bonds.  Agree at this point with some TNA given undetermined time before he starts to have oral intake 3. Dr. Biagio Quint has mentioned possible repeat CT to rule out extravasation prior to oral intake.  Will d/w him timing of this. 4. Cont abx therapy   LOS: 4 days    OSBORNE,KELLY E 10/16/2013, 9:37 AM Pager: [phone]  Small amount of flatus but abdomen still distended and still with bilious NG output.  Not much abdominal pain and LUQ drain still with milky output.  HR elevated but UOP okay.  Will check abdominal films to evaluate distension and NG placement but we will plan for CT abdomen with oral contrast tomorrow or wed.

## 2013-10-16 NOTE — Progress Notes (Signed)
PARENTERAL NUTRITION CONSULT NOTE - INITIAL  Pharmacy Consult for TPN Indication: Prolonged Ileus  Allergies  Allergen Reactions  . Dilaudid [Hydromorphone Hcl] Nausea And Vomiting  . Morphine And Related Nausea And Vomiting    Patient Measurements: Height: 6' (182.9 cm) Weight: 197 lb 5 oz (89.5 kg) IBW/kg (Calculated) : 77.6  Vital Signs: Temp: 97.6 F (36.4 C) (10/27 0757) Temp src: Oral (10/27 0757) BP: 158/107 mmHg (10/27 0800) Pulse Rate: 77 (10/27 0800) Intake/Output from previous day: 10/26 0701 - 10/27 0700 In: 3686 [I.V.:3236; IV Piggyback:450] Out: 3613 [Urine:3300; Emesis/NG output:250; Drains:63]  Labs:  Recent Labs  10/14/13 0310 10/15/13 0340  WBC 9.9 10.0  HGB 11.6* 11.8*  HCT 34.3* 35.7*  PLT 220 213     Recent Labs  10/14/13 0310 10/15/13 0340 10/16/13 0404  NA 135 130* 134*  K 4.4 4.3 4.3  CL 99 95* 97  CO2 25 23 23   GLUCOSE 145* 109* 105*  BUN 12 16 17   CREATININE 0.69 0.68 0.63  CALCIUM 8.8 9.0 8.7   Estimated Creatinine Clearance: 111.8 ml/min (by C-G formula based on Cr of 0.63).    Recent Labs  10/15/13 1811 10/15/13 2357 10/16/13 0545  GLUCAP 75 97 90    Medical History: Past Medical History  Diagnosis Date  . Hyperlipidemia   . Right bundle branch block     Normal LV function normal aortic and mitral valve bedside echocardiogram 4 2012  . Tachycardia     Resolved  . Dyslipidemia   . Coronary artery disease (CAD) excluded     Cardiolite study 2006 within normal limits  . Hypertension   . Allergy   . Cancer     Medications:  Scheduled:  . calcium carbonate  1 tablet Oral Daily  . cloNIDine  0.1 mg Transdermal Weekly  . dexamethasone  2 mg Intravenous Q12H  . enoxaparin (LOVENOX) injection  40 mg Subcutaneous Q24H  . ertapenem (INVANZ) IV  1 g Intravenous Q24H  . fluconazole (DIFLUCAN) IV  200 mg Intravenous Q24H  . insulin aspart  0-9 Units Subcutaneous Q6H  . levETIRAcetam  500 mg Intravenous Q24H  .  metoprolol  10 mg Intravenous Q6H  . pantoprazole (PROTONIX) IV  40 mg Intravenous Q12H   Infusions:  . 0.9 % NaCl with KCl 20 mEq / L 125 mL/hr at 10/16/13 0634  . diltiazem (CARDIZEM) infusion 3 mg/hr (10/16/13 0800)   PRN: acetaminophen, acetaminophen, diphenhydrAMINE, fentaNYL, hydrALAZINE, LORazepam, ondansetron (ZOFRAN) IV, ondansetron  Nutritional Goals:   RD recs: pending  Estimated needs:  93-155 g protein, 2225-2670 kcal per day  Clinimix 5/20 at a goal rate of 83 ml/hr + 20% fat emulsion at 10 ml/hr to provide: 100 g/day protein, 2233 Kcal/day.  Current nutrition:   Diet: NPO  IVF:  0.9% NaCl with 20 mEq KCl at 125 ml/hr  CBGs & Insulin requirements past 24 hours:   CBGs < 100  Sensitive SSI q6h, no recent administrations.  Assessment:  57 yo M admitted 10/23 with abdominal pain, found to have diverticulitis of colon with perforation.  S/p diagnostic laparoscopy on 10/24 with washout but perforation was not identified, 3 JP drains were placed.  Pt now with post op ileus, NGT to LIS, minimal BS, abdominal distention.  Pharmacy is consulted to start TPN for prolonged Ileus.   Renal/hepatic function: SCr remains low/stable.  ALT slightly elevated on 10/23.  F/u am labs.  Electrolytes: Na slightly low, others wnl.  Corr Ca = 9.6 (10/27)  Pre-Albumin: 19.4 (10/24)  TG:   F/u am labs.  Glucose: at goal < 150  TPN Access: PICC ordered 10/27  TPN day#:  1  Plan:  At 1800 today:  Start Clinimix E 5/20 at 40 ml/hr.  20% fat emulsion at 10 ml/hr.  Plan to advance as tolerated to the goal rate.  TNA to contain standard multivitamins and trace elements daily.  Reduce IVF to 85 ml/hr.  Continue sensitive SSI q6h.  TNA lab panels on Mondays & Thursdays.  Pharmacy to f/u daily.   Lynann Beaver PharmD, BCPS Pager [phone] 10/16/2013 1:48 PM

## 2013-10-16 NOTE — Progress Notes (Signed)
Peripherally Inserted Central Catheter/Midline Placement  The IV Nurse has discussed with the patient and/or persons authorized to consent for the patient, the purpose of this procedure and the potential benefits and risks involved with this procedure.  The benefits include less needle sticks, lab draws from the catheter and patient may be discharged home with the catheter.  Risks include, but not limited to, infection, bleeding, blood clot (thrombus formation), and puncture of an artery; nerve damage and irregular heat beat.  Alternatives to this procedure were also discussed.  PICC/Midline Placement Documentation        Vevelyn Pat 10/16/2013, 4:34 PM

## 2013-10-16 NOTE — Progress Notes (Signed)
I discussed the CT results with the on call radiologist.  He says that it appears to be a "stable exam" compared to prior.  This is a tough problem because the patient does not want an ostomy and if surgery is required, he would most likely need this. Since the patient feels better and exam stable with no signs of sepsis, we can probably continue with current management but I will need to talk with him again about his refusal for ostomy and see if this is something that he would consider if no improvement.

## 2013-10-16 NOTE — Progress Notes (Signed)
[facility] PROGRESS NOTE  Martin Martin ZOX:[id number] DOB: 10-Jul-1956 DOA: 10/12/2013 PCP: [name], MD  Assessment/Plan  Diverticulitis of colon with perforation.   -  Continue empiric antibiotics -  POD 3 s/p diagnostic laparoscopy with washout.  Perforation was not identified.  -  Body fluid culture:  E. 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  -  Restart Bactrim for PCP prophylaxis once tolerating PO  Chronic steroid use.   Continue dexamethasone 2mg  IV BID .  Hyperglycemia, likely stress response.  Continue q6h insulin while NPO  Thrush, stable.  Continue Diflucan.  CAD/HTN, blood pressures mildly elevated -  Telemetry:  HR in the 60s -  Continue diltiazem gtt while NPO -  Continue metoprolol 10 mg q6h -  Start clonidine patch -  Patient not on statin or ASA  Hyponatremia and hypochloremia, may be markers of dehydration and fluids shifts. BUN rose slightly also -  Continue NS at and repeat tomorrow  Mild anemia likely secondary to acute illness. Hgb stable.  Repeat weekly or sooner if needed  Diet:  NPO and will start TPN Access:  PIV IVF:  NS at 139ml/h (plan to titrate down as TPN is initiated) Proph:  SCDs  Code Status: full Family Communication: spoke to patient alone Disposition Plan:  pending recovery from surgery.  Anticipate prolonged recovery.  Once off dilt gtt, plan to transfer to telemetry.     Consultants:  General Surgery, Dr. Derrell Lolling  Procedures:  Diagnostic Laparoscopy  Antibiotics:  Primaxin 10/23 >>   HPI/Subjective:  Patient states that his abdomen feels much better.  Denies nausea and vomiting.  Passing gas.  Feeling better.    Objective: Filed  Vitals:   10/16/13 0600 10/16/13 0700 10/16/13 0757 10/16/13 0800  BP: 151/106 146/103  158/107  Pulse: 64 68  77  Temp:   97.6 F (36.4 C)   TempSrc:   Oral   Resp: 11 15  17   Height:      Weight:      SpO2: 91% 96%  95%    Intake/Output Summary (Last 24 hours) at 10/16/13 0943 Last data filed at 10/16/13 0800  Gross per 24 hour  Intake   3391 ml  Output   3513 ml  Net   -122 ml   Filed Weights   10/14/13 0400 10/15/13 0400 10/16/13 0400  Weight: 90.7 kg (199 lb 15.3 oz) 89.9 kg (198 lb 3.1 oz) 89.5 kg (197 lb 5 oz)    Exam:   General:  CM, No acute distress, NG tube in place  HEENT:  NCAT, MMM  Cardiovascular:  RRR, nl S1, S2 no mrg, 2+ pulses, warm extremities  Respiratory:  CTAB, no increased WOB  Abdomen:   + BS, soft, moderately distended, TTP in the inferior quadrants without rebound or guarding.  Drains in place  MSK:   Normal tone and bulk, no LEE  Neuro:  Grossly intact  Data Reviewed: Basic Metabolic Panel:  Recent Labs Lab 10/12/13 1030 10/13/13 0515 10/14/13 0310 10/15/13 0340 10/16/13 0404  NA 133* 134* 135 130* 134*  K 4.2 4.7 4.4 4.3 4.3  CL 92* 98 99 95* 97  CO2 25 25 25  23  23  GLUCOSE 166* 164* 145* 109* 105*  BUN 17 13 12 16 17   CREATININE 0.83 0.77 0.69 0.68 0.63  CALCIUM 9.7 9.4 8.8 9.0 8.7  MG  --  2.4  --   --   --    Liver Function Tests:  Recent Labs Lab 10/09/13 1300 10/12/13 1030  AST 30 19  ALT 124* 79*  ALKPHOS 89 94  BILITOT 0.35 0.3  PROT 6.9 7.2  ALBUMIN 2.6* 2.9*    Recent Labs Lab 10/12/13 1030  LIPASE 39   No results found for this basename: AMMONIA,  in the last 168 hours CBC:  Recent Labs Lab 10/09/13 1300 10/12/13 1030 10/13/13 0515 10/14/13 0310 10/15/13 0340  WBC 9.7 8.7 9.5 9.9 10.0  NEUTROABS 8.6* 7.9*  --   --   --   HGB 12.7* 12.5* 11.8* 11.6* 11.8*  HCT 37.6* 38.2* 35.5* 34.3* 35.7*  MCV 98.2* 99.0 99.4 98.3 98.3  PLT 232 233 224 220 213   Cardiac Enzymes: No results found  for this basename: CKTOTAL, CKMB, CKMBINDEX, TROPONINI,  in the last 168 hours BNP (last 3 results) No results found for this basename: PROBNP,  in the last 8760 hours CBG:  Recent Labs Lab 10/15/13 0527 10/15/13 1122 10/15/13 1811 10/15/13 2357 10/16/13 0545  GLUCAP 101* 101* 75 97 90    Recent Results (from the past 240 hour(s))  TECHNOLOGIST REVIEW     Status: None   Collection Time    10/09/13  1:00 PM      Result Value Range Status   Technologist Review Metas and Myelocytes present, 1% NRBC   Final  SURGICAL PCR SCREEN     Status: None   Collection Time    10/13/13 12:37 PM      Result Value Range Status   MRSA, PCR NEGATIVE  NEGATIVE Final   Staphylococcus aureus NEGATIVE  NEGATIVE Final   Comment:            The Xpert SA Assay (FDA     approved for NASAL specimens     in patients over 61 years of age),     is one component of     a comprehensive surveillance     program.  Test performance has     been validated by [employer] for patients greater     than or equal to 93 year old.     It is not intended     to diagnose infection nor to     guide or monitor treatment.  ANAEROBIC CULTURE     Status: None   Collection Time    10/13/13  2:51 PM      Result Value Range Status   Specimen Description FLUID ADBOMINAL   Final   Special Requests NONE   Final   Gram Stain     Final   Value: ABUNDANT WBC PRESENT,BOTH PMN AND MONONUCLEAR     NO SQUAMOUS EPITHELIAL CELLS SEEN     NO ORGANISMS SEEN     Performed at [facility]   Culture     Final   Value: NO ANAEROBES ISOLATED; CULTURE IN PROGRESS FOR 5 DAYS     Performed at [facility]   Report Status PENDING   Incomplete  BODY FLUID CULTURE     Status: None   Collection Time    10/13/13  2:51 PM      Result Value Range Status   Specimen  Description FLUID ADBOMINAL   Final   Special Requests NONE   Final   Gram Stain     Final   Value: MODERATE WBC PRESENT, PREDOMINANTLY PMN     NO  ORGANISMS SEEN     Performed at [facility]   Culture     Final   Value: MODERATE ESCHERICHIA COLI     Performed at [facility]   Report Status 10/16/2013 FINAL   Final   Organism ID, Bacteria ESCHERICHIA COLI   Final     Studies: No results found.  Scheduled Meds: . calcium carbonate  1 tablet Oral Daily  . dexamethasone  2 mg Intravenous Q12H  . enoxaparin (LOVENOX) injection  40 mg Subcutaneous Q24H  . ertapenem (INVANZ) IV  1 g Intravenous Q24H  . fluconazole (DIFLUCAN) IV  200 mg Intravenous Q24H  . insulin aspart  0-9 Units Subcutaneous Q6H  . levETIRAcetam  500 mg Intravenous Q24H  . metoprolol  10 mg Intravenous Q6H  . pantoprazole (PROTONIX) IV  40 mg Intravenous Q12H   Continuous Infusions: . 0.9 % NaCl with KCl 20 mEq / L 125 mL/hr at 10/16/13 0634  . diltiazem (CARDIZEM) infusion 3 mg/hr (10/16/13 0800)    Principal Problem:   Diverticulitis of colon with perforation Active Problems:   Right sided weakness   Hyperglycemia   Thrush    Time spent: 30 min    Kaislee Chao, [facility]  [facility] Pager [phone]. If 7PM-7AM, please contact night-coverage at [email or web address], password [facility] 10/16/2013, 9:43 AM  LOS: 4 days

## 2013-10-16 NOTE — Progress Notes (Signed)
[id number] Earlene Plater, RN, BSN, CCM [phone] Chart Reviewed for discharge and hospital needs. Discharge needs at time of review:  None Review of patient progress due on [id number].

## 2013-10-17 ENCOUNTER — Telehealth (INDEPENDENT_AMBULATORY_CARE_PROVIDER_SITE_OTHER): Payer: Self-pay | Admitting: *Deleted

## 2013-10-17 LAB — COMPREHENSIVE METABOLIC PANEL
ALT: 34 U/L (ref 0–53)
AST: 17 U/L (ref 0–37)
CO2: 26 mEq/L (ref 19–32)
Calcium: 9.4 mg/dL (ref 8.4–10.5)
Chloride: 94 mEq/L — ABNORMAL LOW (ref 96–112)
Creatinine, Ser: 0.63 mg/dL (ref 0.50–1.35)
GFR calc Af Amer: >90 mL/min (ref >90)
GFR calc non Af Amer: >90 mL/min (ref >90)
Glucose, Bld: 195 mg/dL — ABNORMAL HIGH (ref 70–99)
Sodium: 133 mEq/L — ABNORMAL LOW (ref 135–145)
Total Bilirubin: 0.2 mg/dL — ABNORMAL LOW (ref 0.3–1.2)

## 2013-10-17 LAB — PREALBUMIN: Prealbumin: 18.7 mg/dL (ref 17.0–34.0)

## 2013-10-17 LAB — DIFFERENTIAL
Basophils Absolute: 0.1 10*3/uL (ref 0.0–0.1)
Basophils Relative: 1 % (ref 0–1)
Eosinophils Absolute: 0 10*3/uL (ref 0.0–0.7)
Lymphs Abs: 0.5 10*3/uL — ABNORMAL LOW (ref 0.7–4.0)
Monocytes Absolute: 0.7 10*3/uL (ref 0.1–1.0)
Monocytes Relative: 6 % (ref 3–12)
Neutro Abs: 10.6 10*3/uL — ABNORMAL HIGH (ref 1.7–7.7)

## 2013-10-17 LAB — CBC
HCT: 37.6 % — ABNORMAL LOW (ref 39.0–52.0)
Hemoglobin: 12.9 g/dL — ABNORMAL LOW (ref 13.0–17.0)
MCH: 33.1 pg (ref 26.0–34.0)
MCHC: 34.3 g/dL (ref 30.0–36.0)
MCV: 96.4 fL (ref 78.0–100.0)
RBC: 3.9 MIL/uL — ABNORMAL LOW (ref 4.22–5.81)
RDW: 15.9 % — ABNORMAL HIGH (ref 11.5–15.5)
WBC: 11.9 10*3/uL — ABNORMAL HIGH (ref 4.0–10.5)

## 2013-10-17 LAB — GLUCOSE, CAPILLARY
Glucose-Capillary: 198 mg/dL — ABNORMAL HIGH (ref 70–99)
Glucose-Capillary: 173 mg/dL — ABNORMAL HIGH (ref 70–99)
Glucose-Capillary: 151 mg/dL — ABNORMAL HIGH (ref 70–99)

## 2013-10-17 LAB — PHOSPHORUS: Phosphorus: 3.1 mg/dL (ref 2.3–4.6)

## 2013-10-17 MED ORDER — FENTANYL CITRATE 0.05 MG/ML IJ SOLN
50.0000 ug | INTRAMUSCULAR | Status: DC | PRN
  Administered 2013-10-17: 75 ug via INTRAVENOUS
  Administered 2013-10-18: 100 ug via INTRAVENOUS
  Filled 2013-10-17 (×2): qty 2

## 2013-10-17 MED ORDER — INSULIN ASPART 100 UNIT/ML ~~LOC~~ SOLN
0.0000 [IU] | SUBCUTANEOUS | Status: DC
  Administered 2013-10-17 (×2): 3 [IU] via SUBCUTANEOUS
  Administered 2013-10-17: 2 [IU] via SUBCUTANEOUS
  Administered 2013-10-18: 3 [IU] via SUBCUTANEOUS
  Administered 2013-10-18: 2 [IU] via SUBCUTANEOUS
  Administered 2013-10-18: 05:00:00 3 [IU] via SUBCUTANEOUS
  Administered 2013-10-18 (×2): 2 [IU] via SUBCUTANEOUS
  Administered 2013-10-18: 12:00:00 1 [IU] via SUBCUTANEOUS
  Administered 2013-10-19: 18:00:00 2 [IU] via SUBCUTANEOUS
  Administered 2013-10-19 (×2): 3 [IU] via SUBCUTANEOUS
  Administered 2013-10-19 (×2): 2 [IU] via SUBCUTANEOUS
  Administered 2013-10-20: 3 [IU] via SUBCUTANEOUS
  Administered 2013-10-20: 2 [IU] via SUBCUTANEOUS
  Administered 2013-10-20: 21:00:00 3 [IU] via SUBCUTANEOUS
  Administered 2013-10-20 – 2013-10-21 (×5): 2 [IU] via SUBCUTANEOUS

## 2013-10-17 MED ORDER — FENTANYL 25 MCG/HR TD PT72
25.0000 ug | MEDICATED_PATCH | TRANSDERMAL | Status: DC
  Administered 2013-10-17 – 2013-10-26 (×4): 25 ug via TRANSDERMAL
  Filled 2013-10-17 (×4): qty 1

## 2013-10-17 MED ORDER — TRACE MINERALS CR-CU-F-FE-I-MN-MO-SE-ZN IV SOLN
INTRAVENOUS | Status: AC
  Administered 2013-10-17: 18:00:00 via INTRAVENOUS
  Filled 2013-10-17: qty 2000

## 2013-10-17 MED ORDER — POTASSIUM CHLORIDE IN NACL 20-0.9 MEQ/L-% IV SOLN
INTRAVENOUS | Status: DC
  Administered 2013-10-17 – 2013-10-18 (×3): via INTRAVENOUS
  Filled 2013-10-17 (×3): qty 1000

## 2013-10-17 NOTE — Progress Notes (Signed)
[facility] PROGRESS NOTE  Martin Martin WUJ:[id number] DOB: March 25, 1956 DOA: 10/12/2013 PCP: [name], MD  Assessment/Plan  Diverticulitis of colon with perforation.   -  Continue empiric ertapenem.  Would not narrow antibiotics based on culture and would continue to treat broadly for bowel perforation until we know that the perforation has sealed. -  POD 4 s/p diagnostic laparoscopy with washout.  Perforation was not identified.  -  Body fluid culture:  E. 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  -  Anticipate long recovery -  No need for further PCP prophylaxis per Dr. Welton Flakes -  Patient may live a long time per Dr. Welton Flakes -  Dr. Welton Flakes was fired by the patient and his family, and she requests that we get Dr. Truett Perna involved in his care.  Will page Dr. Truett Perna.    Chronic steroid use.   Continue dexamethasone 2mg  IV BID  Hyperglycemia, likely stress response.  Continue q6h insulin while NPO  Thrush, stable.  Continue Diflucan.  CAD/HTN, blood pressures mildly elevated.   -  Telemetry:  HR in the low 100s, SR -  Continue diltiazem gtt day 2/3 while awaiting clonidine patch to start -  Continue metoprolol 10 mg q6h -  Continue clonidine patch started 10/27 -  Patient not on statin or ASA  Hyponatremia and hypochloremia, may be markers of dehydration and fluids shifts. BUN rose slightly also -  Continue NS at and repeat tomorrow  Mild leukocytosis, however, all cell lines are up today so I think this is probably just artifact.  CT abd/pelvis last night looked stable and he is not having any further symptoms. -  Repeat in  AM Mild anemia likely secondary to acute illness. Hgb stable.  Repeat weekly or sooner if needed  Diet:  NPO and will start TPN Access:  PIV IVF:  NS at 139ml/h (plan to titrate down as TPN is initiated) Proph:  SCDs  Code Status: full Family Communication: spoke to patient alone Disposition Plan:  Transfer to telemetry   Consultants:  General Surgery, Dr. Derrell Lolling  Procedures:  Diagnostic Laparoscopy  Antibiotics:  Ertapenem 10/23 >>   HPI/Subjective:  Patient states that his abdomen occasionally feels bloated at the base, but otherwise he feels okay.  Still passing some gas.     Objective: Filed Vitals:   10/17/13 0400 10/17/13 0500 10/17/13 0600 10/17/13 0700  BP: 157/96 135/108 157/90 144/99  Pulse: 87 105 65 89  Temp: 98.3 F (36.8 C)     TempSrc: Oral     Resp: 14 14 18 13   Height:      Weight:  88.6 kg (195 lb 5.2 oz)    SpO2: 93% 93% 93% 95%    Intake/Output Summary (Last 24 hours) at 10/17/13 0942 Last data filed at 10/17/13 0900  Gross per 24 hour  Intake 3073.25 ml  Output   4524 ml  Net -1450.75 ml   Filed Weights   10/15/13 0400 10/16/13 0400 10/17/13 0500  Weight: 89.9 kg (198 lb 3.1 oz) 89.5 kg (197 lb 5 oz) 88.6 kg (195 lb 5.2 oz)  Exam:   General:  CM, No acute distress, NG tube in place with bilious material  HEENT:  NCAT, MMM  Cardiovascular:  RRR, nl S1, S2 no mrg, 2+ pulses, warm extremities  Respiratory:  CTAB, no increased WOB  Abdomen:   + BS, soft, moderately distended, TTP in the inferior quadrants without rebound or guarding.  Drains in place, purulence from upper left tube  MSK:   Normal tone and bulk, no LEE  Neuro:  Grossly intact  Data Reviewed: Basic Metabolic Panel:  Recent Labs Lab 10/13/13 0515 10/14/13 0310 10/15/13 0340 10/16/13 0404 10/17/13 0458  NA 134* 135 130* 134* 133*  K 4.7 4.4 4.3 4.3 3.9  CL 98 99 95* 97 94*  CO2 25 25 23 23 26   GLUCOSE 164* 145* 109* 105* 195*  BUN 13 12 16 17 15    CREATININE 0.77 0.69 0.68 0.63 0.63  CALCIUM 9.4 8.8 9.0 8.7 9.4  MG 2.4  --   --   --  2.0  PHOS  --   --   --   --  3.1   Liver Function Tests:  Recent Labs Lab 10/12/13 1030 10/17/13 0458  AST 19 17  ALT 79* 34  ALKPHOS 94 83  BILITOT 0.3 0.2*  PROT 7.2 6.6  ALBUMIN 2.9* 2.5*    Recent Labs Lab 10/12/13 1030  LIPASE 39   No results found for this basename: AMMONIA,  in the last 168 hours CBC:  Recent Labs Lab 10/12/13 1030 10/13/13 0515 10/14/13 0310 10/15/13 0340 10/17/13 0458  WBC 8.7 9.5 9.9 10.0 11.9*  NEUTROABS 7.9*  --   --   --  10.6*  HGB 12.5* 11.8* 11.6* 11.8* 12.9*  HCT 38.2* 35.5* 34.3* 35.7* 37.6*  MCV 99.0 99.4 98.3 98.3 96.4  PLT 233 224 220 213 259   Cardiac Enzymes: No results found for this basename: CKTOTAL, CKMB, CKMBINDEX, TROPONINI,  in the last 168 hours BNP (last 3 results) No results found for this basename: PROBNP,  in the last 8760 hours CBG:  Recent Labs Lab 10/16/13 0545 10/16/13 1314 10/16/13 1750 10/16/13 2327 10/17/13 0526  GLUCAP 90 85 97 157* 198*    Recent Results (from the past 240 hour(s))  TECHNOLOGIST REVIEW     Status: None   Collection Time    10/09/13  1:00 PM      Result Value Range Status   Technologist Review Metas and Myelocytes present, 1% NRBC   Final  SURGICAL PCR SCREEN     Status: None   Collection Time    10/13/13 12:37 PM      Result Value Range Status   MRSA, PCR NEGATIVE  NEGATIVE Final   Staphylococcus aureus NEGATIVE  NEGATIVE Final   Comment:            The Xpert SA Assay (FDA     approved for NASAL specimens     in patients over 40 years of age),     is one component of     a comprehensive surveillance     program.  Test performance has     been validated by [employer] for patients greater     than or equal to 53 year old.     It is not intended     to diagnose infection nor to     guide or monitor treatment.  ANAEROBIC CULTURE     Status: None   Collection Time  10/13/13  2:51 PM      Result Value Range Status   Specimen Description FLUID ADBOMINAL   Final   Special Requests NONE   Final   Gram Stain     Final   Value: ABUNDANT WBC PRESENT,BOTH PMN AND MONONUCLEAR     NO SQUAMOUS EPITHELIAL CELLS SEEN     NO ORGANISMS SEEN     Performed at [facility]   Culture     Final   Value: NO ANAEROBES ISOLATED; CULTURE IN PROGRESS FOR 5 DAYS     Performed at [facility]   Report Status PENDING   Incomplete  BODY FLUID CULTURE     Status: None   Collection Time    10/13/13  2:51 PM      Result Value Range Status   Specimen Description FLUID ADBOMINAL   Final   Special Requests NONE   Final   Gram Stain     Final   Value: MODERATE WBC PRESENT, PREDOMINANTLY PMN     NO ORGANISMS SEEN     Performed at [facility]   Culture     Final   Value: MODERATE ESCHERICHIA COLI     Performed at [facility]   Report Status 10/16/2013 FINAL   Final   Organism ID, Bacteria ESCHERICHIA COLI   Final     Studies: Ct Abdomen Pelvis W Contrast  10/16/2013   CLINICAL DATA:  Chronic perforation post surgery and drain placement, abdominal distention, question persistent extraluminal gas  EXAM: CT ABDOMEN AND PELVIS WITH CONTRAST  TECHNIQUE: Multidetector CT imaging of the abdomen and pelvis was performed using the standard protocol following bolus administration of intravenous contrast. Sagittal and coronal MPR images reconstructed from axial data set.  CONTRAST:  50mL OMNIPAQUE IOHEXOL 300 MG/ML SOLN, OMNIPAQUE IOHEXOL 300 MG/ML SOLN  COMPARISON:  10/12/2013  FINDINGS: Bibasilar atelectasis and minimal left pleural effusion.  Nasogastric tube extends into the 2nd portion of duodenum.  Multiple surgical drains present in abdomen, new.  Focus of air within urinary bladder likely reflects prior catheterization.  Mild fatty infiltration of liver.  4 mm nonobstructing calculus inferior pole left kidney image 45.  Liver, spleen, pancreas,  and adrenal glands unremarkable.  Symmetric nephrograms with again identified 3.7 x 3.4 cm left renal cysts.  Extraluminal gas is identified under the left hemidiaphragm, within the mesenteric, an adjacent to the colon from the distal transverse the proximal descending segments consistent with prior colonic perforation.  Again identified thickening of a small bowel loop in the mid abdomen with a small adjacent fluid collection which measures 5.2 x 2.8 cm image 45, previously 5.1 x 3.2 cm.  Additional gas and fluid collection adjacent to the proximal descending colon, 5.6 x 3.8 cm image 43 previously 5.2 x 3.7 cm.  Additional fluid mesenteric fluid collections inferior to the pancreatic head appear stable.  Scattered diverticula of the descending and proximal sigmoid colon.  No evidence of bowel obstruction, with GI contrast present into the ascending colon.  Unremarkable bladder and ureters.  Remaining both bowel loops normal appearance.  No acute osseous findings.  Surgical clips and infiltration at umbilicus likely reflect interval laparoscopy with note of a tiny umbilical hernia.  IMPRESSION: Again seen foci of extraluminal gas compatible with colonic perforation similar to previous exam.  Interval placement of surgical drains.  Persistent focal collections of fluid with minimal gas are seen adjacent to the ascending colon (minimally larger), inferior to pancreatic head (generally  stable), and adjacent to a thickened small bowel loop in the mid abdomen (little changed).  Thickened small bowel loop could be due to ischemia, infection, or inflammatory bowel disease.  No definite new intra-abdominal or intrapelvic abnormalities identified.   Electronically Signed   By: Ulyses Southward M.D.   On: 10/16/2013 18:44   Dg Abd 2 Views  10/16/2013   CLINICAL DATA:  Distended abdomen. Status post laparoscopy for possible perforation, no perforation identified at time of surgery.  EXAM: ABDOMEN - 2 VIEW  COMPARISON:  CT of  the abdomen and pelvis 10/12/2013.  FINDINGS: Nasogastric tube extends into the proximal stomach, with side port just distal to the gastroesophageal junction. There is some oral contrast material in the ascending colon and cecum. Gas is noted throughout the colon and there is a small amount of gas in nondilated loops of small bowel. A surgical drain is noted in the pelvis, and skin staples are seen projecting over the left side of the abdomen. Notably, there are some irregular-shaped locular some gas projecting over the left upper quadrant of the abdomen, which are not clearly within either the colon or the stomach, suspicious for residual retroperitoneal gas as demonstrated on prior CT scan 10/12/2013.  IMPRESSION: 1. Probable persistent gas in the upper left retroperitoneum, suspicious for retroperitoneal perforation from the descending colon, as suggested on prior CT scan 10/12/2013. Given the recent negative laparoscopy, but persistently distended abdomen, further evaluation with repeat contrast-enhanced CT of the abdomen and pelvis may provide additional diagnostic information if clinically indicated. 2. Postoperative changes and support apparatus, as above. These results were called by telephone at the time of interpretation on 10/16/2013 at 1:49 PM to Dr. Lodema Pilot , who verbally acknowledged these results.   Electronically Signed   By: Trudie Reed M.D.   On: 10/16/2013 13:54    Scheduled Meds: . antiseptic oral rinse  15 mL Mouth Rinse q12n4p  . calcium carbonate  1 tablet Oral Daily  . chlorhexidine  15 mL Mouth Rinse BID  . cloNIDine  0.1 mg Transdermal Weekly  . dexamethasone  2 mg Intravenous Q12H  . enoxaparin (LOVENOX) injection  40 mg Subcutaneous Q24H  . ertapenem (INVANZ) IV  1 g Intravenous Q24H  . fentaNYL  25 mcg Transdermal Q72H  . fluconazole (DIFLUCAN) IV  200 mg Intravenous Q24H  . insulin aspart  0-9 Units Subcutaneous Q6H  . levETIRAcetam  500 mg Intravenous Q24H  .  metoprolol  10 mg Intravenous Q6H  . pantoprazole (PROTONIX) IV  40 mg Intravenous Q12H  . sodium chloride  10-40 mL Intracatheter Q12H   Continuous Infusions: . 0.9 % NaCl with KCl 20 mEq / L 85 mL/hr at 10/17/13 0537  . diltiazem (CARDIZEM) infusion 3 mg/hr (10/16/13 0800)  . Marland KitchenTPN (CLINIMIX-E) Adult 40 mL/hr at 10/16/13 1748   And  . fat emulsion 250 mL (10/16/13 1749)    Principal Problem:   Diverticulitis of colon with perforation Active Problems:   Right sided weakness   Hyperglycemia   Thrush    Time spent: 30 min    Shakeerah Gradel, [facility]  [facility] Pager [phone]. If 7PM-7AM, please contact night-coverage at [email or web address], password [facility] 10/17/2013, 9:42 AM  LOS: 5 days

## 2013-10-17 NOTE — Progress Notes (Signed)
INITIAL NUTRITION ASSESSMENT  DOCUMENTATION CODES Per approved criteria  -Not Applicable   INTERVENTION: TPN per Pharmacy Diet advancement per MD discretion RD to continue to Monitor nutrition care plan  NUTRITION DIAGNOSIS: Inadequate oral intake related to inability to eat as evidenced by pt NPO due to perforated colon.   Goal: Pt to meet >/= 90% of their estimated nutrition needs   Monitor:  TPN rate Weight trends Labs Diet advancement Bowel function  Reason for Assessment: Consult for new TPN, TNA  57 y.o. male  Admitting Dx: Diverticulitis of colon with perforation  ASSESSMENT: 57 y/o who is undergoing chemo and radiation therapy for a recurrent glioblastoma. He was seen last month and found to have a left parietal mass lesion showing progression in the wall of the mass with central necrosis, on MRI. His last treatment with Avastin was 10/09/13. Since that time he started having abdominal pain, which has become progressively worse. He presented to the ER with findings on CT showing free air LUQ, and descending colon. Multiple colonic diverticula, small bowel loops with thickening And a fluid collection 5.2 x 3.7 cm. S/P diagnostic laparoscopy and Placement of Drains. Now being started on TPN due to prolonged post-op ileus.  Pt states he was eating well PTA. He reports that he usually weighs 180 lbs but, has gained weight since he started taking steroids. Pt reports having a good appetite today and expresses desire to eat. Triglycerides today were 404 mg/dL (very high).    Height: Ht Readings from Last 1 Encounters:  10/13/13 6' (1.829 m)    Weight: Wt Readings from Last 1 Encounters:  10/17/13 195 lb 5.2 oz (88.6 kg)    Ideal Body Weight: 178 lbs (80.9 kg)  % Ideal Body Weight: 110%  Wt Readings from Last 10 Encounters:  10/17/13 195 lb 5.2 oz (88.6 kg)  10/17/13 195 lb 5.2 oz (88.6 kg)  10/17/13 195 lb 5.2 oz (88.6 kg)  10/09/13 201 lb 11.2 oz (91.491 kg)   09/25/13 201 lb 6.4 oz (91.354 kg)  09/13/13 200 lb 4.8 oz (90.855 kg)  09/08/13 200 lb 1.6 oz (90.765 kg)  09/08/13 200 lb 1.6 oz (90.765 kg)  08/31/13 202 lb 11.2 oz (91.944 kg)  08/28/13 201 lb 11.2 oz (91.491 kg)    Usual Body Weight: 180 lbs  % Usual Body Weight: 108%  BMI:  Body mass index is 26.49 kg/(m^2).  Estimated Nutritional Needs: Kcal: 2300-2500 Protein: 110-125 grams Fluid: 2.3-2.5 L/day  Skin: multiple abdominal incisions with three closed system drains  Diet Order: NPO  EDUCATION NEEDS: -No education needs identified at this time   Intake/Output Summary (Last 24 hours) at 10/17/13 0923 Last data filed at 10/17/13 0600  Gross per 24 hour  Intake 3073.25 ml  Output   4174 ml  Net -1100.75 ml    Last BM: 10/23  Labs:   Recent Labs Lab 10/13/13 0515  10/15/13 0340 10/16/13 0404 10/17/13 0458  NA 134*  < > 130* 134* 133*  K 4.7  < > 4.3 4.3 3.9  CL 98  < > 95* 97 94*  CO2 25  < > 23 23 26   BUN 13  < > 16 17 15   CREATININE 0.77  < > 0.68 0.63 0.63  CALCIUM 9.4  < > 9.0 8.7 9.4  MG 2.4  --   --   --  2.0  PHOS  --   --   --   --  3.1  GLUCOSE 164*  < >  109* 105* 195*  < > = values in this interval not displayed.  CBG (last 3)   Recent Labs  10/16/13 1750 10/16/13 2327 10/17/13 0526  GLUCAP 97 157* 198*    Scheduled Meds: . antiseptic oral rinse  15 mL Mouth Rinse q12n4p  . calcium carbonate  1 tablet Oral Daily  . chlorhexidine  15 mL Mouth Rinse BID  . cloNIDine  0.1 mg Transdermal Weekly  . dexamethasone  2 mg Intravenous Q12H  . enoxaparin (LOVENOX) injection  40 mg Subcutaneous Q24H  . ertapenem (INVANZ) IV  1 g Intravenous Q24H  . fentaNYL  25 mcg Transdermal Q72H  . fluconazole (DIFLUCAN) IV  200 mg Intravenous Q24H  . insulin aspart  0-9 Units Subcutaneous Q6H  . levETIRAcetam  500 mg Intravenous Q24H  . metoprolol  10 mg Intravenous Q6H  . pantoprazole (PROTONIX) IV  40 mg Intravenous Q12H  . sodium chloride  10-40  mL Intracatheter Q12H    Continuous Infusions: . 0.9 % NaCl with KCl 20 mEq / L 85 mL/hr at 10/17/13 0537  . diltiazem (CARDIZEM) infusion 3 mg/hr (10/16/13 0800)  . Marland KitchenTPN (CLINIMIX-E) Adult 40 mL/hr at 10/16/13 1748   And  . fat emulsion 250 mL (10/16/13 1749)    Past Medical History  Diagnosis Date  . Hyperlipidemia   . Right bundle branch block     Normal LV function normal aortic and mitral valve bedside echocardiogram 4 2012  . Tachycardia     Resolved  . Dyslipidemia   . Coronary artery disease (CAD) excluded     Cardiolite study 2006 within normal limits  . Hypertension   . Allergy   . Cancer     Past Surgical History  Procedure Laterality Date  . Left inguinal hernia.  2004  . Craniotomy Left 03/29/2013    Procedure: CRANIOTOMY TUMOR EXCISION;  Surgeon: Karn Cassis, MD;  Location: [facility];  Service: Neurosurgery;  Laterality: Left;  Left Craniotomy for tumor excision  . Colon resection N/A 10/13/2013    Procedure: DIAGNOSTIC LAPAROSCOPY and Placement of Drains;  Surgeon: Ernestene Mention, MD;  Location: [facility];  Service: General;  Laterality: N/A;    Ian Malkin RD, LDN Inpatient Clinical Dietitian Pager: [phone] After Hours Pager: [phone]

## 2013-10-17 NOTE — Progress Notes (Addendum)
PARENTERAL NUTRITION CONSULT NOTE - Follow up  Pharmacy Consult for TPN Indication: Prolonged Ileus  Allergies  Allergen Reactions  . Dilaudid [Hydromorphone Hcl] Nausea And Vomiting  . Morphine And Related Nausea And Vomiting    Patient Measurements: Height: 6' (182.9 cm) Weight: 195 lb 5.2 oz (88.6 kg) IBW/kg (Calculated) : 77.6  Vital Signs: Temp: 98.3 F (36.8 C) (10/28 0400) Temp src: Oral (10/28 0400) BP: 144/99 mmHg (10/28 0700) Pulse Rate: 89 (10/28 0700) Intake/Output from previous day: 10/27 0701 - 10/28 0700 In: 3323.3 [I.V.:2463.4; IV Piggyback:250; TPN:609.8] Out: 4349 [Urine:3550; Emesis/NG output:750; Drains:49]  Labs:  Recent Labs  10/15/13 0340 10/17/13 0458  WBC 10.0 11.9*  HGB 11.8* 12.9*  HCT 35.7* 37.6*  PLT 213 259     Recent Labs  10/15/13 0340 10/16/13 0404 10/17/13 0458  NA 130* 134* 133*  K 4.3 4.3 3.9  CL 95* 97 94*  CO2 23 23 26   GLUCOSE 109* 105* 195*  BUN 16 17 15   CREATININE 0.68 0.63 0.63  CALCIUM 9.0 8.7 9.4  MG  --   --  2.0  PHOS  --   --  3.1  PROT  --   --  6.6  ALBUMIN  --   --  2.5*  AST  --   --  17  ALT  --   --  34  ALKPHOS  --   --  83  BILITOT  --   --  0.2*  TRIG  --   --  404*   Estimated Creatinine Clearance: 111.8 ml/min (by C-G formula based on Cr of 0.63).    Recent Labs  10/16/13 1750 10/16/13 2327 10/17/13 0526  GLUCAP 97 157* 198*    Medications:  Infusions:  . 0.9 % NaCl with KCl 20 mEq / L 85 mL/hr at 10/17/13 0537  . diltiazem (CARDIZEM) infusion 3 mg/hr (10/16/13 0800)  . Marland KitchenTPN (CLINIMIX-E) Adult 40 mL/hr at 10/16/13 1748   And  . fat emulsion 250 mL (10/16/13 1749)   Nutritional Goals:   RD recs (10/28):  2300-2500 Kcal/day and 110-125 g protein/day  Clinimix 5/20 at a goal rate of 100 ml/hr + 20% fat emulsion at 10 ml/hr to provide: 120 g/day protein, 2592 Kcal/day.  Current nutrition:   Diet: NPO  IVF:  0.9% NaCl with 20 mEq KCl at 85 ml/hr  CBGs & Insulin  requirements past 24 hours:   CBGs:  85 - 198; elevated after starting TPN  Sensitive SSI q6h:  4 units  Assessment:  57 yo M admitted 10/23 with abdominal pain, found to have diverticulitis of colon with perforation.  S/p diagnostic laparoscopy on 10/24 with washout but perforation was not identified, 3 JP drains were placed.  Pt now with post op ileus, NGT to LIS, minimal BS, abdominal distention.  Pharmacy is consulted to start TPN for prolonged Ileus.   Renal/hepatic function: SCr remains low/stable.  LFTs, Alk Phos and Bili wnl (10/28).  Electrolytes: Na and Cl slightly low, others wnl.  Corr Ca = 10.6 (10/28)  Pre-Albumin: 19.4 (10/24)  TG:   404 (10/28)  Above goal of TG < 150 after first day of IV lipids.  Glucose: above goal but adjusting SSI today  TPN Access: PICC placed 10/27  TPN day#:  2  Plan:  At 1800 today:  Increase to Clinimix E 5/20 at 60 ml/hr.  Hold IV lipids today d/t elevated TG.  TNA to contain standard multivitamins and trace elements daily.  Reduce  IVF to 65 ml/hr.  Increase to moderate SSI q4h.  TNA lab panels on Mondays & Thursdays.  Pharmacy to f/u daily.  BMET and TG in AM.   Lynann Beaver PharmD, BCPS Pager [phone] 10/17/2013 11:02 AM

## 2013-10-17 NOTE — Telephone Encounter (Signed)
Patient's sister called asking if there is anyway that Dr. Derrell Lolling could go by and see patient.  Sister states that patient has multiple questions and is getting frustrated.  She states that patient thinks a lot of Dr. Derrell Lolling and thinks this would really help the patient.  Explained that I would send a message to let Dr. Derrell Lolling know of their request and see what he says.  Did explain that Dr. Derrell Lolling is in back to back cases today then in office this afternoon but I could send the message.  Sister is appreciative and states understanding at this time.

## 2013-10-17 NOTE — Progress Notes (Signed)
Has done well since transfer to floor. Has not requested additional pain medication since placement of Fentanyl patch. Ambulate entire unit with standby assistance.

## 2013-10-17 NOTE — Progress Notes (Signed)
4 Days Post-Op  Subjective: No abdominal pain.  Small amount of flatus   Objective: Vital signs in last 24 hours: Temp:  [97.5 F (36.4 C)-98.4 F (36.9 C)] 98.3 F (36.8 C) (10/28 0400) Pulse Rate:  [65-127] 89 (10/28 0700) Resp:  [0-27] 13 (10/28 0700) BP: (111-168)/(75-122) 144/99 mmHg (10/28 0700) SpO2:  [86 %-98 %] 95 % (10/28 0700) Weight:  [195 lb 5.2 oz (88.6 kg)] 195 lb 5.2 oz (88.6 kg) (10/28 0500) Last BM Date: 10/12/13  Intake/Output from previous day: 10/27 0701 - 10/28 0700 In: 3323.3 [I.V.:2463.4; IV Piggyback:250; TPN:609.8] Out: 4349 [Urine:3550; Emesis/NG output:750; Drains:49] Intake/Output this shift: Total I/O In: -  Out: 350 [Urine:350]  General appearance: alert, cooperative and no distress GI: soft, NT, ND (seems at baseline), no peritoneal signs, JPs with minimal output.  JP #1 with milky output but other drains serous  Lab Results:   Recent Labs  10/15/13 0340 10/17/13 0458  WBC 10.0 11.9*  HGB 11.8* 12.9*  HCT 35.7* 37.6*  PLT 213 259   BMET  Recent Labs  10/16/13 0404 10/17/13 0458  NA 134* 133*  K 4.3 3.9  CL 97 94*  CO2 23 26  GLUCOSE 105* 195*  BUN 17 15  CREATININE 0.63 0.63  CALCIUM 8.7 9.4   PT/INR No results found for this basename: LABPROT, INR,  in the last 72 hours ABG No results found for this basename: PHART, PCO2, PO2, HCO3,  in the last 72 hours  Studies/Results: Ct Abdomen Pelvis W Contrast  10/16/2013   CLINICAL DATA:  Chronic perforation post surgery and drain placement, abdominal distention, question persistent extraluminal gas  EXAM: CT ABDOMEN AND PELVIS WITH CONTRAST  TECHNIQUE: Multidetector CT imaging of the abdomen and pelvis was performed using the standard protocol following bolus administration of intravenous contrast. Sagittal and coronal MPR images reconstructed from axial data set.  CONTRAST:  50mL OMNIPAQUE IOHEXOL 300 MG/ML SOLN, OMNIPAQUE IOHEXOL 300 MG/ML SOLN  COMPARISON:  10/12/2013   FINDINGS: Bibasilar atelectasis and minimal left pleural effusion.  Nasogastric tube extends into the 2nd portion of duodenum.  Multiple surgical drains present in abdomen, new.  Focus of air within urinary bladder likely reflects prior catheterization.  Mild fatty infiltration of liver.  4 mm nonobstructing calculus inferior pole left kidney image 45.  Liver, spleen, pancreas, and adrenal glands unremarkable.  Symmetric nephrograms with again identified 3.7 x 3.4 cm left renal cysts.  Extraluminal gas is identified under the left hemidiaphragm, within the mesenteric, an adjacent to the colon from the distal transverse the proximal descending segments consistent with prior colonic perforation.  Again identified thickening of a small bowel loop in the mid abdomen with a small adjacent fluid collection which measures 5.2 x 2.8 cm image 45, previously 5.1 x 3.2 cm.  Additional gas and fluid collection adjacent to the proximal descending colon, 5.6 x 3.8 cm image 43 previously 5.2 x 3.7 cm.  Additional fluid mesenteric fluid collections inferior to the pancreatic head appear stable.  Scattered diverticula of the descending and proximal sigmoid colon.  No evidence of bowel obstruction, with GI contrast present into the ascending colon.  Unremarkable bladder and ureters.  Remaining both bowel loops normal appearance.  No acute osseous findings.  Surgical clips and infiltration at umbilicus likely reflect interval laparoscopy with note of a tiny umbilical hernia.  IMPRESSION: Again seen foci of extraluminal gas compatible with colonic perforation similar to previous exam.  Interval placement of surgical drains.  Persistent focal  collections of fluid with minimal gas are seen adjacent to the ascending colon (minimally larger), inferior to pancreatic head (generally stable), and adjacent to a thickened small bowel loop in the mid abdomen (little changed).  Thickened small bowel loop could be due to ischemia, infection, or  inflammatory bowel disease.  No definite new intra-abdominal or intrapelvic abnormalities identified.   Electronically Signed   By: Ulyses Southward M.D.   On: 10/16/2013 18:44   Dg Abd 2 Views  10/16/2013   CLINICAL DATA:  Distended abdomen. Status post laparoscopy for possible perforation, no perforation identified at time of surgery.  EXAM: ABDOMEN - 2 VIEW  COMPARISON:  CT of the abdomen and pelvis 10/12/2013.  FINDINGS: Nasogastric tube extends into the proximal stomach, with side port just distal to the gastroesophageal junction. There is some oral contrast material in the ascending colon and cecum. Gas is noted throughout the colon and there is a small amount of gas in nondilated loops of small bowel. A surgical drain is noted in the pelvis, and skin staples are seen projecting over the left side of the abdomen. Notably, there are some irregular-shaped locular some gas projecting over the left upper quadrant of the abdomen, which are not clearly within either the colon or the stomach, suspicious for residual retroperitoneal gas as demonstrated on prior CT scan 10/12/2013.  IMPRESSION: 1. Probable persistent gas in the upper left retroperitoneum, suspicious for retroperitoneal perforation from the descending colon, as suggested on prior CT scan 10/12/2013. Given the recent negative laparoscopy, but persistently distended abdomen, further evaluation with repeat contrast-enhanced CT of the abdomen and pelvis may provide additional diagnostic information if clinically indicated. 2. Postoperative changes and support apparatus, as above. These results were called by telephone at the time of interpretation on 10/16/2013 at 1:49 PM to Dr. Lodema Pilot , who verbally acknowledged these results.   Electronically Signed   By: Trudie Reed M.D.   On: 10/16/2013 13:54    Anti-infectives: Anti-infectives   Start     Dose/Rate Route Frequency Ordered Stop   10/13/13 1800  fluconazole (DIFLUCAN) IVPB 200 mg     200  mg 100 mL/hr over 60 Minutes Intravenous Every 24 hours 10/13/13 1707 10/23/13 1759   10/13/13 1715  ertapenem (INVANZ) 1 g in sodium chloride 0.9 % 50 mL IVPB  Status:  Discontinued     1 g 100 mL/hr over 30 Minutes Intravenous Every 24 hours 10/13/13 1707 10/13/13 1750   10/12/13 1600  fluconazole (DIFLUCAN) tablet 100 mg  Status:  Discontinued     100 mg Oral Daily 10/12/13 1428 10/13/13 1707   10/12/13 1600  sulfamethoxazole-trimethoprim (BACTRIM DS) 800-160 MG per tablet 1 tablet  Status:  Discontinued     1 tablet Oral Daily 10/12/13 1428 10/13/13 1707   10/12/13 1600  ertapenem (INVANZ) 1 g in sodium chloride 0.9 % 50 mL IVPB     1 g 100 mL/hr over 30 Minutes Intravenous Every 24 hours 10/12/13 1507        Assessment/Plan: s/p Procedure(s): DIAGNOSTIC LAPAROSCOPY and Placement of Drains (N/A) I spoke with the patient about his CT scan and the options.   He appears to be about the same.  CT unchanged.  I asked him what he would want [place] to do in the case that this current management does not work.  I explained that if surgery is required to take care of the problem, he would likely require ostomy.  He again reiterated that he would  not want an ostomy even if this meant life saving treatment.  With his refusal for an ostomy, I think that we really only have the current options for treatment.  I would keep him NPO and continue abx and drains. He has not shown any decline and there is no reason to feel that this option will not work for him.   LOS: 5 days    Lodema Pilot DAVID 10/17/2013

## 2013-10-18 ENCOUNTER — Ambulatory Visit: Payer: BC Managed Care – PPO | Admitting: Radiation Oncology

## 2013-10-18 ENCOUNTER — Telehealth: Payer: Self-pay | Admitting: Radiation Oncology

## 2013-10-18 DIAGNOSIS — C713 Malignant neoplasm of parietal lobe: Secondary | ICD-10-CM

## 2013-10-18 LAB — GLUCOSE, CAPILLARY
Glucose-Capillary: 153 mg/dL — ABNORMAL HIGH (ref 70–99)
Glucose-Capillary: 134 mg/dL — ABNORMAL HIGH (ref 70–99)
Glucose-Capillary: 137 mg/dL — ABNORMAL HIGH (ref 70–99)

## 2013-10-18 LAB — BASIC METABOLIC PANEL
BUN: 16 mg/dL (ref 6–23)
CO2: 25 mEq/L (ref 19–32)
Calcium: 9 mg/dL (ref 8.4–10.5)
Chloride: 100 mEq/L (ref 96–112)
Creatinine, Ser: 0.62 mg/dL (ref 0.50–1.35)
GFR calc Af Amer: >90 mL/min (ref >90)
GFR calc non Af Amer: >90 mL/min (ref >90)

## 2013-10-18 LAB — ANAEROBIC CULTURE

## 2013-10-18 LAB — TRIGLYCERIDES: Triglycerides: 344 mg/dL — ABNORMAL HIGH (ref <150)

## 2013-10-18 MED ORDER — TRACE MINERALS CR-CU-F-FE-I-MN-MO-SE-ZN IV SOLN
INTRAVENOUS | Status: AC
  Administered 2013-10-18: 18:00:00 via INTRAVENOUS
  Filled 2013-10-18: qty 2000

## 2013-10-18 NOTE — Progress Notes (Signed)
Patient ID: Martin Martin, male   DOB: December 20, 1956, 57 y.o.   MRN: [id number] 5 Days Post-Op  Subjective: Pt feels great today.  No abdominal pain.  + flatus.  Feels less distended  Objective: Vital signs in last 24 hours: Temp:  [97.6 F (36.4 C)-97.8 F (36.6 C)] 97.8 F (36.6 C) (10/29 0435) Pulse Rate:  [77-117] 104 (10/29 0435) Resp:  [18-20] 18 (10/29 0435) BP: (120-143)/(77-100) 120/79 mmHg (10/29 0435) SpO2:  [94 %-98 %] 95 % (10/29 0435) Last BM Date: 10/12/13  Intake/Output from previous day: 10/28 0701 - 10/29 0700 In: 3084.4 [P.O.:60; I.V.:1804.4; IV Piggyback:100; TPN:1120] Out: 2347 [Urine:2000; Emesis/NG output:300; Drains:47] Intake/Output this shift: Total I/O In: -  Out: 200 [Urine:200]  PE: Abd: soft, less distended, NT, drain 1 with tan, purulent appearing output, but others with just serous output. Heart: regular  Lab Results:   Recent Labs  10/17/13 0458  WBC 11.9*  HGB 12.9*  HCT 37.6*  PLT 259   BMET  Recent Labs  10/17/13 0458 10/18/13 0610  NA 133* 135  K 3.9 3.9  CL 94* 100  CO2 26 25  GLUCOSE 195* 142*  BUN 15 16  CREATININE 0.63 0.62  CALCIUM 9.4 9.0   PT/INR No results found for this basename: LABPROT, INR,  in the last 72 hours CMP     Component Value Date/Time   NA 135 10/18/2013 0610   NA 141 10/09/2013 1300   K 3.9 10/18/2013 0610   K 4.4 10/09/2013 1300   CL 100 10/18/2013 0610   CL 102 06/05/2013 1406   CO2 25 10/18/2013 0610   CO2 21* 10/09/2013 1300   GLUCOSE 142* 10/18/2013 0610   GLUCOSE 181* 10/09/2013 1300   GLUCOSE 165* 06/05/2013 1406   BUN 16 10/18/2013 0610   BUN 19.6 10/09/2013 1300   CREATININE 0.62 10/18/2013 0610   CREATININE 0.7 10/09/2013 1300   CALCIUM 9.0 10/18/2013 0610   CALCIUM 9.3 10/09/2013 1300   PROT 6.6 10/17/2013 0458   PROT 6.9 10/09/2013 1300   ALBUMIN 2.5* 10/17/2013 0458   ALBUMIN 2.6* 10/09/2013 1300   AST 17 10/17/2013 0458   AST 30 10/09/2013 1300   ALT 34 10/17/2013  0458   ALT 124* 10/09/2013 1300   ALKPHOS 83 10/17/2013 0458   ALKPHOS 89 10/09/2013 1300   BILITOT 0.2* 10/17/2013 0458   BILITOT 0.35 10/09/2013 1300   GFRNONAA >90 10/18/2013 0610   GFRAA >90 10/18/2013 0610   Lipase     Component Value Date/Time   LIPASE 39 10/12/2013 1030       Studies/Results: Ct Abdomen Pelvis W Contrast  10/16/2013   CLINICAL DATA:  Chronic perforation post surgery and drain placement, abdominal distention, question persistent extraluminal gas  EXAM: CT ABDOMEN AND PELVIS WITH CONTRAST  TECHNIQUE: Multidetector CT imaging of the abdomen and pelvis was performed using the standard protocol following bolus administration of intravenous contrast. Sagittal and coronal MPR images reconstructed from axial data set.  CONTRAST:  50mL OMNIPAQUE IOHEXOL 300 MG/ML SOLN, OMNIPAQUE IOHEXOL 300 MG/ML SOLN  COMPARISON:  10/12/2013  FINDINGS: Bibasilar atelectasis and minimal left pleural effusion.  Nasogastric tube extends into the 2nd portion of duodenum.  Multiple surgical drains present in abdomen, new.  Focus of air within urinary bladder likely reflects prior catheterization.  Mild fatty infiltration of liver.  4 mm nonobstructing calculus inferior pole left kidney image 45.  Liver, spleen, pancreas, and adrenal glands unremarkable.  Symmetric nephrograms with again identified  3.7 x 3.4 cm left renal cysts.  Extraluminal gas is identified under the left hemidiaphragm, within the mesenteric, an adjacent to the colon from the distal transverse the proximal descending segments consistent with prior colonic perforation.  Again identified thickening of a small bowel loop in the mid abdomen with a small adjacent fluid collection which measures 5.2 x 2.8 cm image 45, previously 5.1 x 3.2 cm.  Additional gas and fluid collection adjacent to the proximal descending colon, 5.6 x 3.8 cm image 43 previously 5.2 x 3.7 cm.  Additional fluid mesenteric fluid collections inferior to the  pancreatic head appear stable.  Scattered diverticula of the descending and proximal sigmoid colon.  No evidence of bowel obstruction, with GI contrast present into the ascending colon.  Unremarkable bladder and ureters.  Remaining both bowel loops normal appearance.  No acute osseous findings.  Surgical clips and infiltration at umbilicus likely reflect interval laparoscopy with note of a tiny umbilical hernia.  IMPRESSION: Again seen foci of extraluminal gas compatible with colonic perforation similar to previous exam.  Interval placement of surgical drains.  Persistent focal collections of fluid with minimal gas are seen adjacent to the ascending colon (minimally larger), inferior to pancreatic head (generally stable), and adjacent to a thickened small bowel loop in the mid abdomen (little changed).  Thickened small bowel loop could be due to ischemia, infection, or inflammatory bowel disease.  No definite new intra-abdominal or intrapelvic abnormalities identified.   Electronically Signed   By: Ulyses Southward M.D.   On: 10/16/2013 18:44   Dg Abd 2 Views  10/16/2013   CLINICAL DATA:  Distended abdomen. Status post laparoscopy for possible perforation, no perforation identified at time of surgery.  EXAM: ABDOMEN - 2 VIEW  COMPARISON:  CT of the abdomen and pelvis 10/12/2013.  FINDINGS: Nasogastric tube extends into the proximal stomach, with side port just distal to the gastroesophageal junction. There is some oral contrast material in the ascending colon and cecum. Gas is noted throughout the colon and there is a small amount of gas in nondilated loops of small bowel. A surgical drain is noted in the pelvis, and skin staples are seen projecting over the left side of the abdomen. Notably, there are some irregular-shaped locular some gas projecting over the left upper quadrant of the abdomen, which are not clearly within either the colon or the stomach, suspicious for residual retroperitoneal gas as demonstrated  on prior CT scan 10/12/2013.  IMPRESSION: 1. Probable persistent gas in the upper left retroperitoneum, suspicious for retroperitoneal perforation from the descending colon, as suggested on prior CT scan 10/12/2013. Given the recent negative laparoscopy, but persistently distended abdomen, further evaluation with repeat contrast-enhanced CT of the abdomen and pelvis may provide additional diagnostic information if clinically indicated. 2. Postoperative changes and support apparatus, as above. These results were called by telephone at the time of interpretation on 10/16/2013 at 1:49 PM to Dr. Lodema Pilot , who verbally acknowledged these results.   Electronically Signed   By: Trudie Reed M.D.   On: 10/16/2013 13:54    Anti-infectives: Anti-infectives   Start     Dose/Rate Route Frequency Ordered Stop   10/13/13 1800  fluconazole (DIFLUCAN) IVPB 200 mg     200 mg 100 mL/hr over 60 Minutes Intravenous Every 24 hours 10/13/13 1707 10/23/13 1759   10/13/13 1715  ertapenem (INVANZ) 1 g in sodium chloride 0.9 % 50 mL IVPB  Status:  Discontinued     1 g 100 mL/hr  over 30 Minutes Intravenous Every 24 hours 10/13/13 1707 10/13/13 1750   10/12/13 1600  fluconazole (DIFLUCAN) tablet 100 mg  Status:  Discontinued     100 mg Oral Daily 10/12/13 1428 10/13/13 1707   10/12/13 1600  sulfamethoxazole-trimethoprim (BACTRIM DS) 800-160 MG per tablet 1 tablet  Status:  Discontinued     1 tablet Oral Daily 10/12/13 1428 10/13/13 1707   10/12/13 1600  ertapenem (INVANZ) 1 g in sodium chloride 0.9 % 50 mL IVPB     1 g 100 mL/hr over 30 Minutes Intravenous Every 24 hours 10/12/13 1507         Assessment/Plan  1. Perforated viscus, unknown etiology, ? From colon 2. S/p dx laparoscopy with washout and drain placement 3. 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 Will DC NGT and possibly give him clear liquids later today.  Defer that decision to Dr. Biagio Quint. 2. Otherwise, patient feeling better  and seems to be improving.   LOS: 6 days    OSBORNE,KELLY E 10/18/2013, 9:20 AM Pager: [phone] NG out and denies abdominal pain.  I would be slow to feed him because our fall back plan of surgery with resection and ostomy is really not an option for him since he is refusing this.  Otherwise he seems to be clinically improving despite unchanged CT scan

## 2013-10-18 NOTE — Progress Notes (Addendum)
[facility] PROGRESS NOTE  Martin Martin ZOX:[id number] DOB: 1956-09-23 DOA: 10/12/2013 PCP: [name], MD  Assessment/Plan  Diverticulitis of colon with perforation.   -  Continue empiric ertapenem.  Plan was not to narrow antibiotics based on culture and to continue to treat broadly for bowel perforation until we know that the perforation has sealed. -  POD 5 s/p diagnostic laparoscopy with washout.  Perforation was not identified.  -  Body fluid culture:  E. 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  -  Anticipate long recovery -  Dr Malachi Bonds discussed pt with Onc and stated -No need for further PCP prophylaxis per Dr. Welton Flakes, Patient may live a long time per Dr. Welton Flakes -  See Dr Joan Mayans 10/28 note, talked with Dr Truett Perna today and he states Dr Myna Hidalgo will likely be the on to see/follow up with pt.    Chronic steroid use.   Continue dexamethasone 2mg  IV BID  Hyperglycemia, likely stress response, and steroids.  Continue q6h insulin while on TNA and NPO  Thrush, stable.  Continue Diflucan.  CAD/HTN -  Continue diltiazem gtt for now through 10/30 at 10am as that will complted the planned 3days while awaiting clonidine patch  -  Continue metoprolol 10 mg q6h -  Continue clonidine patch started 10/27 -  Patient not on statin or ASA -  ok BP and HR control - follow and change to PO meds once able to tolerate POs per surgery  Hyponatremia and hypochloremia, were likely due to dehydration and fluids shifts. -  Resolved with hydration, follow  Mild leukocytosis -CT abd/pelvis last night looked stable and he is not having  any further symptoms. - likely due to steroids  Mild anemia likely secondary to acute illness. Hgb stable.  Repeat weekly or sooner if needed  Diet:  continue TPN Access:  PIV Proph:  SCDs  Code Status: full Family Communication: spoke to patient alone Disposition Plan:  Transfer to telemetry   Consultants:  General Surgery, Dr. Derrell Lolling  Procedures:  Diagnostic Laparoscopy  Antibiotics:  Ertapenem 10/23 >>   HPI/Subjective:  Up and walking with nsg staff earlier this am. +Flatus, no BM. NG was removed this am and he denies any N/V/abd pain    Objective: Filed Vitals:   10/17/13 1329 10/17/13 2027 10/18/13 0435 10/18/13 1303  BP: 124/94 121/77 120/79 123/85  Pulse: 77 100 104 96  Temp: 97.6 F (36.4 C) 97.7 F (36.5 C) 97.8 F (36.6 C) 98.5 F (36.9 C)  TempSrc: Oral Oral Oral Oral  Resp: 20 20 18 18   Height:      Weight:      SpO2: 98% 94% 95% 96%    Intake/Output Summary (Last 24 hours) at 10/18/13 1655 Last data filed at 10/18/13 1544  Gross per 24 hour  Intake 2550.42 ml  Output   2607 ml  Net -56.58 ml   Filed Weights   10/15/13 0400 10/16/13 0400 10/17/13 0500  Weight: 89.9 kg (198 lb 3.1 oz) 89.5 kg (197 lb 5 oz) 88.6 kg (195 lb 5.2 oz)    Exam:   General:  CM, No acute distress, NG tube out  HEENT:  NCAT, MMM  Cardiovascular:  RRR, nl S1, S2 no mrg, 2+ pulses, warm extremities  Respiratory:  CTAB, no increased WOB  Abdomen:   + BS, soft, moderately distended, NT in the inferior quadrants without rebound or guarding.  Drains in place, purulence from upper left tube  MSK:   Normal tone and bulk, no LEE  Neuro:  Grossly intact  Data Reviewed: Basic Metabolic Panel:  Recent Labs Lab 10/13/13 0515 10/14/13 0310 10/15/13 0340 10/16/13 0404 10/17/13 0458 10/18/13 0610  NA 134* 135 130* 134* 133* 135  K 4.7 4.4 4.3 4.3 3.9 3.9  CL 98 99 95* 97 94* 100  CO2 25 25 23 23 26 25   GLUCOSE 164* 145* 109* 105* 195* 142*  BUN 13 12 16  17 15 16   CREATININE 0.77 0.69 0.68 0.63 0.63 0.62  CALCIUM 9.4 8.8 9.0 8.7 9.4 9.0  MG 2.4  --   --   --  2.0  --   PHOS  --   --   --   --  3.1  --    Liver Function Tests:  Recent Labs Lab 10/12/13 1030 10/17/13 0458  AST 19 17  ALT 79* 34  ALKPHOS 94 83  BILITOT 0.3 0.2*  PROT 7.2 6.6  ALBUMIN 2.9* 2.5*    Recent Labs Lab 10/12/13 1030  LIPASE 39   No results found for this basename: AMMONIA,  in the last 168 hours CBC:  Recent Labs Lab 10/12/13 1030 10/13/13 0515 10/14/13 0310 10/15/13 0340 10/17/13 0458  WBC 8.7 9.5 9.9 10.0 11.9*  NEUTROABS 7.9*  --   --   --  10.6*  HGB 12.5* 11.8* 11.6* 11.8* 12.9*  HCT 38.2* 35.5* 34.3* 35.7* 37.6*  MCV 99.0 99.4 98.3 98.3 96.4  PLT 233 224 220 213 259   Cardiac Enzymes: No results found for this basename: CKTOTAL, CKMB, CKMBINDEX, TROPONINI,  in the last 168 hours BNP (last 3 results) No results found for this basename: PROBNP,  in the last 8760 hours CBG:  Recent Labs Lab 10/18/13 0007 10/18/13 0358 10/18/13 0729 10/18/13 1141 10/18/13 1540  GLUCAP 160* 153* 134* 121* 137*    Recent Results (from the past 240 hour(s))  TECHNOLOGIST REVIEW     Status: None   Collection Time    10/09/13  1:00 PM      Result Value Range Status   Technologist Review Metas and Myelocytes present, 1% NRBC   Final  SURGICAL PCR SCREEN     Status: None   Collection Time    10/13/13 12:37 PM      Result Value Range Status   MRSA, PCR NEGATIVE  NEGATIVE Final   Staphylococcus aureus NEGATIVE  NEGATIVE Final   Comment:            The Xpert SA Assay (FDA     approved for NASAL specimens     in patients over 30 years of age),     is one component of     a comprehensive surveillance     program.  Test performance has     been validated by [employer] for patients greater     than or equal to 24 year old.     It is not intended     to diagnose infection nor to     guide or monitor treatment.  ANAEROBIC CULTURE      Status:  None   Collection Time    10/13/13  2:51 PM      Result Value Range Status   Specimen Description FLUID ADBOMINAL   Final   Special Requests NONE   Final   Gram Stain     Final   Value: ABUNDANT WBC PRESENT,BOTH PMN AND MONONUCLEAR     NO SQUAMOUS EPITHELIAL CELLS SEEN     NO ORGANISMS SEEN     Performed at [facility]   Culture     Final   Value: NO ANAEROBES ISOLATED     Performed at [facility]   Report Status 10/18/2013 FINAL   Final  BODY FLUID CULTURE     Status: None   Collection Time    10/13/13  2:51 PM      Result Value Range Status   Specimen Description FLUID ADBOMINAL   Final   Special Requests NONE   Final   Gram Stain     Final   Value: MODERATE WBC PRESENT, PREDOMINANTLY PMN     NO ORGANISMS SEEN     Performed at [facility]   Culture     Final   Value: MODERATE ESCHERICHIA COLI     Performed at [facility]   Report Status 10/16/2013 FINAL   Final   Organism ID, Bacteria ESCHERICHIA COLI   Final     Studies: Ct Abdomen Pelvis W Contrast  10/16/2013   CLINICAL DATA:  Chronic perforation post surgery and drain placement, abdominal distention, question persistent extraluminal gas  EXAM: CT ABDOMEN AND PELVIS WITH CONTRAST  TECHNIQUE: Multidetector CT imaging of the abdomen and pelvis was performed using the standard protocol following bolus administration of intravenous contrast. Sagittal and coronal MPR images reconstructed from axial data set.  CONTRAST:  50mL OMNIPAQUE IOHEXOL 300 MG/ML SOLN, OMNIPAQUE IOHEXOL 300 MG/ML SOLN  COMPARISON:  10/12/2013  FINDINGS: Bibasilar atelectasis and minimal left pleural effusion.  Nasogastric tube extends into the 2nd portion of duodenum.  Multiple surgical drains present in abdomen, new.  Focus of air within urinary bladder likely reflects prior catheterization.  Mild fatty infiltration of liver.  4 mm nonobstructing calculus inferior pole left kidney image 45.  Liver, spleen,  pancreas, and adrenal glands unremarkable.  Symmetric nephrograms with again identified 3.7 x 3.4 cm left renal cysts.  Extraluminal gas is identified under the left hemidiaphragm, within the mesenteric, an adjacent to the colon from the distal transverse the proximal descending segments consistent with prior colonic perforation.  Again identified thickening of a small bowel loop in the mid abdomen with a small adjacent fluid collection which measures 5.2 x 2.8 cm image 45, previously 5.1 x 3.2 cm.  Additional gas and fluid collection adjacent to the proximal descending colon, 5.6 x 3.8 cm image 43 previously 5.2 x 3.7 cm.  Additional fluid mesenteric fluid collections inferior to the pancreatic head appear stable.  Scattered diverticula of the descending and proximal sigmoid colon.  No evidence of bowel obstruction, with GI contrast present into the ascending colon.  Unremarkable bladder and ureters.  Remaining both bowel loops normal appearance.  No acute osseous findings.  Surgical clips and infiltration at umbilicus likely reflect interval laparoscopy with note of a tiny umbilical hernia.  IMPRESSION: Again seen foci of extraluminal gas compatible with colonic perforation similar to previous exam.  Interval placement of surgical drains.  Persistent focal collections of fluid with minimal gas are seen adjacent to the ascending colon (minimally larger), inferior to  pancreatic head (generally stable), and adjacent to a thickened small bowel loop in the mid abdomen (little changed).  Thickened small bowel loop could be due to ischemia, infection, or inflammatory bowel disease.  No definite new intra-abdominal or intrapelvic abnormalities identified.   Electronically Signed   By: Ulyses Southward M.D.   On: 10/16/2013 18:44    Scheduled Meds: . antiseptic oral rinse  15 mL Mouth Rinse q12n4p  . calcium carbonate  1 tablet Oral Daily  . chlorhexidine  15 mL Mouth Rinse BID  . cloNIDine  0.1 mg Transdermal Weekly  .  dexamethasone  2 mg Intravenous Q12H  . enoxaparin (LOVENOX) injection  40 mg Subcutaneous Q24H  . ertapenem (INVANZ) IV  1 g Intravenous Q24H  . fentaNYL  25 mcg Transdermal Q72H  . fluconazole (DIFLUCAN) IV  200 mg Intravenous Q24H  . insulin aspart  0-15 Units Subcutaneous Q4H  . levETIRAcetam  500 mg Intravenous Q24H  . metoprolol  10 mg Intravenous Q6H  . pantoprazole (PROTONIX) IV  40 mg Intravenous Q12H  . sodium chloride  10-40 mL Intracatheter Q12H   Continuous Infusions: . Marland KitchenTPN (CLINIMIX-E) Adult    . 0.9 % NaCl with KCl 20 mEq / L 65 mL/hr at 10/18/13 0825  . diltiazem (CARDIZEM) infusion 3 mg/hr (10/18/13 1556)  . Marland KitchenTPN (CLINIMIX-E) Adult 60 mL/hr at 10/17/13 1749    Principal Problem:   Diverticulitis of colon with perforation Active Problems:   Right sided weakness   Hyperglycemia   Thrush    Time spent: 35 min    Cesario Weidinger C  [facility] Pager [phone]. If 7PM-7AM, please contact night-coverage at [email or web address], password [facility] 10/18/2013, 4:55 PM  LOS: 6 days

## 2013-10-18 NOTE — Telephone Encounter (Addendum)
Phoned to assess patient status. Spoke with sister, Olegario Messier, who was in the hospital room with the patient. She reports her brother's status is improving. She goes on to say they his drain remain in and only one of the drains is expressing tan milky white drain now. She reports that the physician's assistant has written an order to pull the NG tube today and begin clear liquids. She reports her brother has less abdominal distention today. Happily she reports that Dr. Derrell Lolling saw her brother yesterday and as a result her brother's spirits have improved.

## 2013-10-18 NOTE — Progress Notes (Signed)
PARENTERAL NUTRITION CONSULT NOTE - Follow up  Pharmacy Consult for TPN Indication: Prolonged Ileus  Allergies  Allergen Reactions  . Dilaudid [Hydromorphone Hcl] Nausea And Vomiting  . Morphine And Related Nausea And Vomiting    Patient Measurements: Height: 6' (182.9 cm) Weight: 195 lb 5.2 oz (88.6 kg) IBW/kg (Calculated) : 77.6  Vital Signs: Temp: 97.8 F (36.6 C) (10/29 0435) Temp src: Oral (10/29 0435) BP: 120/79 mmHg (10/29 0435) Pulse Rate: 104 (10/29 0435) Intake/Output from previous day: 10/28 0701 - 10/29 0700 In: 3084.4 [P.O.:60; I.V.:1804.4; IV Piggyback:100; TPN:1120] Out: 2347 [Urine:2000; Emesis/NG output:300; Drains:47]  Labs:  Recent Labs  10/17/13 0458  WBC 11.9*  HGB 12.9*  HCT 37.6*  PLT 259     Recent Labs  10/16/13 0404 10/17/13 0458 10/18/13 0610  NA 134* 133* 135  K 4.3 3.9 3.9  CL 97 94* 100  CO2 23 26 25   GLUCOSE 105* 195* 142*  BUN 17 15 16   CREATININE 0.63 0.63 0.62  CALCIUM 8.7 9.4 9.0  MG  --  2.0  --   PHOS  --  3.1  --   PROT  --  6.6  --   ALBUMIN  --  2.5*  --   AST  --  17  --   ALT  --  34  --   ALKPHOS  --  83  --   BILITOT  --  0.2*  --   PREALBUMIN  --  18.7  --   TRIG  --  404* 344*   Estimated Creatinine Clearance: 111.8 ml/min (by C-G formula based on Cr of 0.62).    Recent Labs  10/18/13 0007 10/18/13 0358 10/18/13 0729  GLUCAP 160* 153* 134*    Medications:  Infusions:  . 0.9 % NaCl with KCl 20 mEq / L 65 mL/hr at 10/18/13 0825  . diltiazem (CARDIZEM) infusion 3 mg/hr (10/17/13 1630)  . Marland KitchenTPN (CLINIMIX-E) Adult 60 mL/hr at 10/17/13 1749   Nutritional Goals:   RD recs (10/28):  2300-2500 Kcal/day and 110-125 g protein/day  Clinimix 5/20 at a goal rate of 100 ml/hr + 20% fat emulsion at 10 ml/hr to provide: 120 g/day protein, 2592 Kcal/day.  Current nutrition:   Diet: NPO  IVF:  0.9% NaCl with 20 mEq KCl at 65 ml/hr  CBGs & Insulin requirements past 24 hours:   CBGs:  122 - 160;  elevated after starting TPN  Sensitive SSI q6h:  10units/12h  Assessment:  57 yo M admitted 10/23 with abdominal pain, found to have diverticulitis of colon with perforation.  S/p diagnostic laparoscopy on 10/24 with washout but perforation was not identified, 3 JP drains were placed.  Pt now with post op ileus, NGT to LIS, minimal BS, abdominal distention.  Pharmacy is consulted to start TPN for prolonged Ileus.  10/29: TPN D#3 - patient feeling better today, to D/C NGT and possibly start CLQ diet later today.    Renal/hepatic function: SCr remains low/stable. Good UOP.  LFTs, Alk Phos and Bili wnl (10/28).  Electrolytes: WNL.  Corr Ca = 10.6 (10/28)  Pre-Albumin: 19.4 (10/24)  TG:   404 (10/28), 344 (10/29)  Above goal of TG < 150 after first day of IV lipids.  Glucose: above goal - SSI adjusted 10/28, no h/o DM  TPN Access: PICC placed 10/27  TPN day#:  3  Plan:  At 1800 today:  Increase to Clinimix E 5/20 at 80 ml/hr.  Await orders to start diet and adjust TPN  accordingly  Watch CBGs, may need to add insulin to TPN  Hold IV lipids today d/t elevated TG.  TNA to contain standard multivitamins and trace elements daily.  Reduce IVF to 45 ml/hr.  Continue moderate SSI q4h.  TNA lab panels on Mondays & Thursdays.  Pharmacy to f/u daily.  Juliette Alcide, PharmD, BCPS.   Pager: [phone] 10/18/2013 10:50 AM

## 2013-10-19 ENCOUNTER — Ambulatory Visit: Payer: BC Managed Care – PPO | Admitting: Radiation Oncology

## 2013-10-19 LAB — COMPREHENSIVE METABOLIC PANEL
ALT: 41 U/L (ref 0–53)
AST: 24 U/L (ref 0–37)
Alkaline Phosphatase: 81 U/L (ref 39–117)
CO2: 24 mEq/L (ref 19–32)
GFR calc Af Amer: >90 mL/min (ref >90)
Glucose, Bld: 158 mg/dL — ABNORMAL HIGH (ref 70–99)
Potassium: 3.9 mEq/L (ref 3.5–5.1)
Sodium: 135 mEq/L (ref 135–145)
Total Protein: 6.2 g/dL (ref 6.0–8.3)

## 2013-10-19 LAB — CBC
HCT: 34.7 % — ABNORMAL LOW (ref 39.0–52.0)
Hemoglobin: 12 g/dL — ABNORMAL LOW (ref 13.0–17.0)
MCH: 33.6 pg (ref 26.0–34.0)
MCHC: 34.6 g/dL (ref 30.0–36.0)
MCV: 97.2 fL (ref 78.0–100.0)
Platelets: 233 10*3/uL (ref 150–400)
RBC: 3.57 MIL/uL — ABNORMAL LOW (ref 4.22–5.81)

## 2013-10-19 LAB — GLUCOSE, CAPILLARY
Glucose-Capillary: 150 mg/dL — ABNORMAL HIGH (ref 70–99)
Glucose-Capillary: 129 mg/dL — ABNORMAL HIGH (ref 70–99)
Glucose-Capillary: 174 mg/dL — ABNORMAL HIGH (ref 70–99)
Glucose-Capillary: 121 mg/dL — ABNORMAL HIGH (ref 70–99)

## 2013-10-19 MED ORDER — TRACE MINERALS CR-CU-F-FE-I-MN-MO-SE-ZN IV SOLN
INTRAVENOUS | Status: AC
  Administered 2013-10-19: 18:00:00 via INTRAVENOUS
  Filled 2013-10-19: qty 2000

## 2013-10-19 MED ORDER — POTASSIUM CHLORIDE IN NACL 20-0.9 MEQ/L-% IV SOLN
INTRAVENOUS | Status: DC
  Administered 2013-10-19: 19:00:00 45 mL/h via INTRAVENOUS
  Administered 2013-10-20 – 2013-10-22 (×3): via INTRAVENOUS
  Administered 2013-10-23: 13:00:00 45 mL/h via INTRAVENOUS
  Administered 2013-10-26 – 2013-11-02 (×4): via INTRAVENOUS
  Filled 2013-10-19 (×20): qty 1000

## 2013-10-19 NOTE — Progress Notes (Addendum)
[facility] PROGRESS NOTE  Martin Martin WGN:[id number] DOB: [birth year] DOA: 10/12/2013 PCP: [name], MD  Assessment/Plan  Diverticulitis of colon with perforation.   -  Continue empiric ertapenem.  Plan was not to narrow antibiotics based on culture and to continue to treat broadly for bowel perforation until we know that the perforation has sealed. -  POD 5 s/p diagnostic laparoscopy with washout.  Perforation was not identified.  -  Body fluid culture:  E. 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  -  Anticipate long recovery -  Dr Malachi Bonds discussed pt with Onc and stated -No need for further PCP prophylaxis per Dr. Welton Flakes, Patient may live a long time per Dr. Welton Flakes -  See Dr Joan Mayans 10/28 note, talked with Dr Truett Perna 10/29 and he stated Dr Myna Hidalgo will likely be the one to see/follow up with pt.    Chronic steroid use.   Continue dexamethasone 2mg  IV BID  Hyperglycemia, likely stress response, and steroids.  Continue q6h insulin while on TNA and NPO  Thrush, stable.  Continue Diflucan.  CAD/HTN - diltiazem gtt DC'd today 10/30 at 10am after completing the planned 3days while awaiting clonidine patch, his rate and blood pressure remained controlled at this time, will follow  -  Continue metoprolol 10 mg q6h -  Continue clonidine patch started 10/27 -  Patient not on statin or ASA - follow and change to PO meds once tolerating by mouth as well  Hyponatremia and hypochloremia, were likely due to dehydration and fluids shifts. -  Resolved with hydration, follow  Mild leukocytosis -CT abd/pelvis last night looked stable and  he is not having any further symptoms. - likely due to steroids -This WBC still trending up will also obtain UA, monitoring #1 and surgery also following   Mild anemia  likely secondary to acute illness. Hgb stable.   Diet:  continue TPN Access:  PIV Proph:  SCDs  Code Status: full Family Communication: spoke to patient alone Disposition Plan:  Transfer to telemetry   Consultants:  General Surgery, Dr. Derrell Lolling  Procedures:  Diagnostic Laparoscopy  Antibiotics:  Ertapenem 10/23 >>   HPI/Subjective:  Up and walking with nsg staff earlier this am. +Flatus, no BM. NG was removed this am and he denies any N/V/abd pain    Objective: Filed Vitals:   10/18/13 0435 10/18/13 1303 10/18/13 2031 10/19/13 0428  BP: 120/79 123/85 123/91 126/84  Pulse: 104 96 80 87  Temp: 97.8 F (36.6 Martin) 98.5 F (36.9 Martin) 98.3 F (36.8 Martin) 97.4 F (36.3 Martin)  TempSrc: Oral Oral Oral Oral  Resp: 18 18 18 18   Height:      Weight:      SpO2: 95% 96% 96% 99%    Intake/Output Summary (Last 24 hours) at 10/19/13 1026 Last data filed at 10/19/13 0700  Gross per 24 hour  Intake   3247 ml  Output   2365 ml  Net    882 ml   Filed Weights   10/15/13 0400 10/16/13 0400 10/17/13 0500  Weight: 89.9 kg (198 lb 3.1 oz) 89.5 kg (197 lb 5 oz)  88.6 kg (195 lb 5.2 oz)    Exam:   General:  CM, No acute distress, NG tube out  HEENT:  NCAT, MMM  Cardiovascular:  RRR, nl S1, S2 no mrg, 2+ pulses, warm extremities  Respiratory:  CTAB, no increased WOB  Abdomen:   + BS, soft, moderately distended, NT in the inferior quadrants without rebound or guarding.  Drains in place, purulence from upper left tube  MSK:   Normal tone and bulk, no LEE  Neuro:  Grossly intact  Data Reviewed: Basic Metabolic Panel:  Recent Labs Lab 10/13/13 0515  10/15/13 0340 10/16/13 0404 10/17/13 0458 10/18/13 0610 10/19/13 0455  NA 134*  < > 130* 134* 133* 135 135  K 4.7  < > 4.3 4.3 3.9 3.9 3.9  CL 98  < > 95* 97  94* 100 99  CO2 25  < > 23 23 26 25 24   GLUCOSE 164*  < > 109* 105* 195* 142* 158*  BUN 13  < > 16 17 15 16 16   CREATININE 0.77  < > 0.68 0.63 0.63 0.62 0.58  CALCIUM 9.4  < > 9.0 8.7 9.4 9.0 9.0  MG 2.4  --   --   --  2.0  --  1.9  PHOS  --   --   --   --  3.1  --  3.1  < > = values in this interval not displayed. Liver Function Tests:  Recent Labs Lab 10/12/13 1030 10/17/13 0458 10/19/13 0455  AST 19 17 24   ALT 79* 34 41  ALKPHOS 94 83 81  BILITOT 0.3 0.2* 0.3  PROT 7.2 6.6 6.2  ALBUMIN 2.9* 2.5* 2.4*    Recent Labs Lab 10/12/13 1030  LIPASE 39   No results found for this basename: AMMONIA,  in the last 168 hours CBC:  Recent Labs Lab 10/12/13 1030 10/13/13 0515 10/14/13 0310 10/15/13 0340 10/17/13 0458 10/19/13 0455  WBC 8.7 9.5 9.9 10.0 11.9* 13.2*  NEUTROABS 7.9*  --   --   --  10.6*  --   HGB 12.5* 11.8* 11.6* 11.8* 12.9* 12.0*  HCT 38.2* 35.5* 34.3* 35.7* 37.6* 34.7*  MCV 99.0 99.4 98.3 98.3 96.4 97.2  PLT 233 224 220 213 259 233   Cardiac Enzymes: No results found for this basename: CKTOTAL, CKMB, CKMBINDEX, TROPONINI,  in the last 168 hours BNP (last 3 results) No results found for this basename: PROBNP,  in the last 8760 hours CBG:  Recent Labs Lab 10/18/13 1540 10/18/13 2027 10/19/13 0012 10/19/13 0424 10/19/13 0741  GLUCAP 137* 128* 109* 150* 129*    Recent Results (from the past 240 hour(s))  TECHNOLOGIST REVIEW     Status: None   Collection Time    10/09/13  1:00 PM      Result Value Range Status   Technologist Review Metas and Myelocytes present, 1% NRBC   Final  SURGICAL PCR SCREEN     Status: None   Collection Time    10/13/13 12:37 PM      Result Value Range Status   MRSA, PCR NEGATIVE  NEGATIVE Final   Staphylococcus aureus NEGATIVE  NEGATIVE Final   Comment:            The Xpert SA Assay (FDA     approved for NASAL specimens     in patients over 9 years of age),     is one component of     a comprehensive  surveillance  program.  Test performance has     been validated by [facility] for patients greater     than or equal to 27 year old.     It is not intended     to diagnose infection nor to     guide or monitor treatment.  ANAEROBIC CULTURE     Status: None   Collection Time    10/13/13  2:51 PM      Result Value Range Status   Specimen Description FLUID ADBOMINAL   Final   Special Requests NONE   Final   Gram Stain     Final   Value: ABUNDANT WBC PRESENT,BOTH PMN AND MONONUCLEAR     NO SQUAMOUS EPITHELIAL CELLS SEEN     NO ORGANISMS SEEN     Performed at [facility]   Culture     Final   Value: NO ANAEROBES ISOLATED     Performed at [facility]   Report Status 10/18/2013 FINAL   Final  BODY FLUID CULTURE     Status: None   Collection Time    10/13/13  2:51 PM      Result Value Range Status   Specimen Description FLUID ADBOMINAL   Final   Special Requests NONE   Final   Gram Stain     Final   Value: MODERATE WBC PRESENT, PREDOMINANTLY PMN     NO ORGANISMS SEEN     Performed at [facility]   Culture     Final   Value: MODERATE ESCHERICHIA COLI     Performed at [facility]   Report Status 10/16/2013 FINAL   Final   Organism ID, Bacteria ESCHERICHIA COLI   Final     Studies: No results found.  Scheduled Meds: . antiseptic oral rinse  15 mL Mouth Rinse q12n4p  . calcium carbonate  1 tablet Oral Daily  . chlorhexidine  15 mL Mouth Rinse BID  . cloNIDine  0.1 mg Transdermal Weekly  . dexamethasone  2 mg Intravenous Q12H  . enoxaparin (LOVENOX) injection  40 mg Subcutaneous Q24H  . ertapenem (INVANZ) IV  1 g Intravenous Q24H  . fentaNYL  25 mcg Transdermal Q72H  . fluconazole (DIFLUCAN) IV  200 mg Intravenous Q24H  . insulin aspart  0-15 Units Subcutaneous Q4H  . levETIRAcetam  500 mg Intravenous Q24H  . metoprolol  10 mg Intravenous Q6H  . pantoprazole (PROTONIX) IV  40 mg Intravenous Q12H  . sodium chloride  10-40 mL  Intracatheter Q12H   Continuous Infusions: . Marland KitchenTPN (CLINIMIX-E) Adult 80 mL/hr at 10/18/13 1800  . 0.9 % NaCl with KCl 20 mEq / L 45 mL/hr at 10/19/13 1000    Principal Problem:   Diverticulitis of colon with perforation Active Problems:   Right sided weakness   Hyperglycemia   Thrush    Time spent: 25 min    Martin Martin  [facility] Pager [phone]. If 7PM-7AM, please contact night-coverage at [email or web address], password [facility] 10/19/2013, 10:26 AM  LOS: 7 days

## 2013-10-19 NOTE — Progress Notes (Signed)
Patient ID: JOE GEE, male   DOB: 1956-08-28, 57 y.o.   MRN: [id number] 6 Days Post-Op  Subjective: Pt feels well.  No abdominal complaints.  Objective: Vital signs in last 24 hours: Temp:  [97.4 F (36.3 C)-98.5 F (36.9 C)] 97.4 F (36.3 C) (10/30 0428) Pulse Rate:  [80-96] 87 (10/30 0428) Resp:  [18] 18 (10/30 0428) BP: (123-126)/(84-91) 126/84 mmHg (10/30 0428) SpO2:  [96 %-99 %] 99 % (10/30 0428) Last BM Date: 10/12/13  Intake/Output from previous day: 10/29 0701 - 10/30 0700 In: 3247 [I.V.:1632; IV Piggyback:155; TPN:1460] Out: 2940 [Urine:2875; Drains:65] Intake/Output this shift: Total I/O In: -  Out: 400 [Urine:400]  PE: Abd: soft, +BS, incisions c/d/i, JP 1 with milky tan output, all drains with serous output.  JP3 has trouble keeping suction.  Lab Results:   Recent Labs  10/17/13 0458 10/19/13 0455  WBC 11.9* 13.2*  HGB 12.9* 12.0*  HCT 37.6* 34.7*  PLT 259 233   BMET  Recent Labs  10/18/13 0610 10/19/13 0455  NA 135 135  K 3.9 3.9  CL 100 99  CO2 25 24  GLUCOSE 142* 158*  BUN 16 16  CREATININE 0.62 0.58  CALCIUM 9.0 9.0   PT/INR No results found for this basename: LABPROT, INR,  in the last 72 hours CMP     Component Value Date/Time   NA 135 10/19/2013 0455   NA 141 10/09/2013 1300   K 3.9 10/19/2013 0455   K 4.4 10/09/2013 1300   CL 99 10/19/2013 0455   CL 102 06/05/2013 1406   CO2 24 10/19/2013 0455   CO2 21* 10/09/2013 1300   GLUCOSE 158* 10/19/2013 0455   GLUCOSE 181* 10/09/2013 1300   GLUCOSE 165* 06/05/2013 1406   BUN 16 10/19/2013 0455   BUN 19.6 10/09/2013 1300   CREATININE 0.58 10/19/2013 0455   CREATININE 0.7 10/09/2013 1300   CALCIUM 9.0 10/19/2013 0455   CALCIUM 9.3 10/09/2013 1300   PROT 6.2 10/19/2013 0455   PROT 6.9 10/09/2013 1300   ALBUMIN 2.4* 10/19/2013 0455   ALBUMIN 2.6* 10/09/2013 1300   AST 24 10/19/2013 0455   AST 30 10/09/2013 1300   ALT 41 10/19/2013 0455   ALT 124* 10/09/2013 1300   ALKPHOS  81 10/19/2013 0455   ALKPHOS 89 10/09/2013 1300   BILITOT 0.3 10/19/2013 0455   BILITOT 0.35 10/09/2013 1300   GFRNONAA >90 10/19/2013 0455   GFRAA >90 10/19/2013 0455   Lipase     Component Value Date/Time   LIPASE 39 10/12/2013 1030       Studies/Results: No results found.  Anti-infectives: Anti-infectives   Start     Dose/Rate Route Frequency Ordered Stop   10/13/13 1800  fluconazole (DIFLUCAN) IVPB 200 mg     200 mg 100 mL/hr over 60 Minutes Intravenous Every 24 hours 10/13/13 1707 10/23/13 1759   10/13/13 1715  ertapenem (INVANZ) 1 g in sodium chloride 0.9 % 50 mL IVPB  Status:  Discontinued     1 g 100 mL/hr over 30 Minutes Intravenous Every 24 hours 10/13/13 1707 10/13/13 1750   10/12/13 1600  fluconazole (DIFLUCAN) tablet 100 mg  Status:  Discontinued     100 mg Oral Daily 10/12/13 1428 10/13/13 1707   10/12/13 1600  sulfamethoxazole-trimethoprim (BACTRIM DS) 800-160 MG per tablet 1 tablet  Status:  Discontinued     1 tablet Oral Daily 10/12/13 1428 10/13/13 1707   10/12/13 1600  ertapenem (INVANZ) 1 g in sodium chloride 0.9 %  50 mL IVPB     1 g 100 mL/hr over 30 Minutes Intravenous Every 24 hours 10/12/13 1507         Assessment/Plan  1. Perforated viscus, location undetermined, ? Colon vs sb 2. Stage 4 glioblastoma, on steroids and Avastin  Plan: 1. Will try clear liquids today and see how he does.  WBC has trended up slight to 13.9K.  Unclear if this is secondary to his abdomen or not.  Will follow closely and see how he does with his clear liquids and what continues to drain from his drains.   LOS: 7 days    OSBORNE,KELLY E 10/19/2013, 11:57 AM Pager: [phone]  Sitting at the bedside eating clears.  Denies abdominal pain.  Slowly advance diet and we will see how he does.

## 2013-10-19 NOTE — Progress Notes (Signed)
PARENTERAL NUTRITION CONSULT NOTE - Follow up  Pharmacy Consult for TPN Indication: Prolonged Ileus  Allergies  Allergen Reactions  . Dilaudid [Hydromorphone Hcl] Nausea And Vomiting  . Morphine And Related Nausea And Vomiting    Patient Measurements: Height: 6' (182.9 cm) Weight: 195 lb 5.2 oz (88.6 kg) IBW/kg (Calculated) : 77.6  Vital Signs: Temp: 97.4 F (36.3 C) (10/30 0428) Temp src: Oral (10/30 0428) BP: 126/84 mmHg (10/30 0428) Pulse Rate: 87 (10/30 0428) Intake/Output from previous day: 10/29 0701 - 10/30 0700 In: 3247 [I.V.:1632; IV Piggyback:155; TPN:1460] Out: 2940 [Urine:2875; Drains:65]  Labs:  Recent Labs  10/17/13 0458 10/19/13 0455  WBC 11.9* 13.2*  HGB 12.9* 12.0*  HCT 37.6* 34.7*  PLT 259 233     Recent Labs  10/17/13 0458 10/18/13 0610 10/19/13 0455  NA 133* 135 135  K 3.9 3.9 3.9  CL 94* 100 99  CO2 26 25 24   GLUCOSE 195* 142* 158*  BUN 15 16 16   CREATININE 0.63 0.62 0.58  CALCIUM 9.4 9.0 9.0  MG 2.0  --  1.9  PHOS 3.1  --  3.1  PROT 6.6  --  6.2  ALBUMIN 2.5*  --  2.4*  AST 17  --  24  ALT 34  --  41  ALKPHOS 83  --  81  BILITOT 0.2*  --  0.3  PREALBUMIN 18.7  --   --   TRIG 404* 344*  --    Estimated Creatinine Clearance: 111.8 ml/min (by C-G formula based on Cr of 0.58).    Recent Labs  10/19/13 0012 10/19/13 0424 10/19/13 0741  GLUCAP 109* 150* 129*    Medications:  Infusions:  . Marland KitchenTPN (CLINIMIX-E) Adult 80 mL/hr at 10/18/13 1800  . 0.9 % NaCl with KCl 20 mEq / L 65 mL/hr at 10/18/13 2311  . diltiazem (CARDIZEM) infusion 3 mg/hr (10/18/13 1556)   Nutritional Goals:   RD recs (10/28):  2300-2500 Kcal/day and 110-125 g protein/day  Clinimix 5/20 at a goal rate of 100 ml/hr + 20% fat emulsion at 10 ml/hr to provide: 120 g/day protein, 2592 Kcal/day.  Current nutrition:   Diet: NPO  IVF:  0.9% NaCl with 20 mEq KCl at 45 ml/hr  CBGs & Insulin requirements past 24 hours:   CBGs:  109 - 150; elevated after  starting TPN  Sensitive SSI q6h:  9units/24h  Assessment:  57 yo M admitted 10/23 with abdominal pain, found to have diverticulitis of colon with perforation.  S/p diagnostic laparoscopy on 10/24 with washout but perforation was not identified, 3 JP drains were placed.  Pt now with post op ileus, NGT to LIS, minimal BS, abdominal distention.  Pharmacy is consulted to start TPN for prolonged Ileus.  10/30: TPN D#4 - patient feeling better today, to D/C NGT and possibly start CLQ diet later today.    Renal/hepatic function: SCr stable. Good UOP.  LFTs, Alk Phos and Bili wnl (10/28).  Electrolytes: WNL.  Corr Ca = 10.3 (10/30)  Pre-Albumin: 19.4 (10/24)  TG:   404 (10/28), 344 (10/29)  Above goal of TG < 150 after first day of IV lipids.  Glucose: above goal - SSI adjusted 10/28, no h/o DM  TPN Access: PICC placed 10/27  Plan:  At 1800 today  Continue Clinimix E 5/20 at 80 ml/hr (goal 158ml/hr)  Hold off advancing TPN as appears may start clears  Await orders to start diet and adjust TPN accordingly  Watch CBGs, may need to add  insulin to TPN  Hold IV lipids today d/t elevated TG, recheck in am  TNA to contain standard multivitamins and trace elements daily.  Continue moderate SSI q4h.  TNA lab panels on Mondays & Thursdays.  Pharmacy to f/u daily.  Juliette Alcide, PharmD, BCPS.   Pager: [phone] 10/19/2013 8:02 AM

## 2013-10-20 ENCOUNTER — Encounter: Payer: Self-pay | Admitting: *Deleted

## 2013-10-20 LAB — URINALYSIS, ROUTINE W REFLEX MICROSCOPIC
Bilirubin Urine: NEGATIVE
Hgb urine dipstick: NEGATIVE
Leukocytes, UA: NEGATIVE
Nitrite: NEGATIVE
Specific Gravity, Urine: 1.019 (ref 1.005–1.030)
Urobilinogen, UA: 0.2 mg/dL (ref 0.0–1.0)
pH: 5.5 (ref 5.0–8.0)

## 2013-10-20 LAB — BASIC METABOLIC PANEL
BUN: 17 mg/dL (ref 6–23)
Calcium: 9.1 mg/dL (ref 8.4–10.5)
Creatinine, Ser: 0.6 mg/dL (ref 0.50–1.35)
GFR calc Af Amer: >90 mL/min (ref >90)
GFR calc non Af Amer: >90 mL/min (ref >90)

## 2013-10-20 LAB — GLUCOSE, CAPILLARY
Glucose-Capillary: 123 mg/dL — ABNORMAL HIGH (ref 70–99)
Glucose-Capillary: 125 mg/dL — ABNORMAL HIGH (ref 70–99)
Glucose-Capillary: 126 mg/dL — ABNORMAL HIGH (ref 70–99)
Glucose-Capillary: 127 mg/dL — ABNORMAL HIGH (ref 70–99)
Glucose-Capillary: 151 mg/dL — ABNORMAL HIGH (ref 70–99)
Glucose-Capillary: 161 mg/dL — ABNORMAL HIGH (ref 70–99)

## 2013-10-20 LAB — CBC
HCT: 34.8 % — ABNORMAL LOW (ref 39.0–52.0)
MCHC: 33.6 g/dL (ref 30.0–36.0)
MCV: 96.9 fL (ref 78.0–100.0)
RBC: 3.59 MIL/uL — ABNORMAL LOW (ref 4.22–5.81)
RDW: 16 % — ABNORMAL HIGH (ref 11.5–15.5)
WBC: 13.3 10*3/uL — ABNORMAL HIGH (ref 4.0–10.5)

## 2013-10-20 MED ORDER — TRACE MINERALS CR-CU-F-FE-I-MN-MO-SE-ZN IV SOLN
INTRAVENOUS | Status: AC
  Administered 2013-10-20: 17:00:00 via INTRAVENOUS
  Filled 2013-10-20: qty 2000

## 2013-10-20 NOTE — Progress Notes (Signed)
Patient ID: Martin Martin, male   DOB: 03/06/1956, 57 y.o.   MRN: [id number] 7 Days Post-Op  Subjective: Pt feels well today.  No complaints.  Tolerating clear liquids and had a BM  Objective: Vital signs in last 24 hours: Temp:  [97.4 F (36.3 C)-97.9 F (36.6 C)] 97.9 F (36.6 C) (10/31 0623) Pulse Rate:  [69-119] 104 (10/31 0623) Resp:  [18-20] 20 (10/31 0623) BP: (120-145)/(68-99) 145/99 mmHg (10/31 0623) SpO2:  [97 %-98 %] 98 % (10/31 0623) Last BM Date: 10/19/13  Intake/Output from previous day: 10/30 0701 - 10/31 0700 In: 4071.9 [P.O.:1720; I.V.:1056.3; IV Piggyback:305; TPN:990.7] Out: 2488 [Urine:2450; Drains:38] Intake/Output this shift: Total I/O In: 240 [P.O.:240] Out: 201 [Urine:200; Stool:1]  PE: Abd: soft, NT, incisions are c/d/i, JP 2 and 3 won't hold charge for a significant period of time.  One of those drains has some serous output today, but also has air bubbles present.  JP 1 still has purulent material, but also has small bubbles present in the fluid.  Lab Results:   Recent Labs  10/19/13 0455 10/20/13 0512  WBC 13.2* 13.3*  HGB 12.0* 11.7*  HCT 34.7* 34.8*  PLT 233 256   BMET  Recent Labs  10/19/13 0455 10/20/13 0340  NA 135 136  K 3.9 3.9  CL 99 100  CO2 24 25  GLUCOSE 158* 166*  BUN 16 17  CREATININE 0.58 0.60  CALCIUM 9.0 9.1   PT/INR No results found for this basename: LABPROT, INR,  in the last 72 hours CMP     Component Value Date/Time   NA 136 10/20/2013 0340   NA 141 10/09/2013 1300   K 3.9 10/20/2013 0340   K 4.4 10/09/2013 1300   CL 100 10/20/2013 0340   CL 102 06/05/2013 1406   CO2 25 10/20/2013 0340   CO2 21* 10/09/2013 1300   GLUCOSE 166* 10/20/2013 0340   GLUCOSE 181* 10/09/2013 1300   GLUCOSE 165* 06/05/2013 1406   BUN 17 10/20/2013 0340   BUN 19.6 10/09/2013 1300   CREATININE 0.60 10/20/2013 0340   CREATININE 0.7 10/09/2013 1300   CALCIUM 9.1 10/20/2013 0340   CALCIUM 9.3 10/09/2013 1300   PROT 6.2  10/19/2013 0455   PROT 6.9 10/09/2013 1300   ALBUMIN 2.4* 10/19/2013 0455   ALBUMIN 2.6* 10/09/2013 1300   AST 24 10/19/2013 0455   AST 30 10/09/2013 1300   ALT 41 10/19/2013 0455   ALT 124* 10/09/2013 1300   ALKPHOS 81 10/19/2013 0455   ALKPHOS 89 10/09/2013 1300   BILITOT 0.3 10/19/2013 0455   BILITOT 0.35 10/09/2013 1300   GFRNONAA >90 10/20/2013 0340   GFRAA >90 10/20/2013 0340   Lipase     Component Value Date/Time   LIPASE 39 10/12/2013 1030       Studies/Results: No results found.  Anti-infectives: Anti-infectives   Start     Dose/Rate Route Frequency Ordered Stop   10/13/13 1800  fluconazole (DIFLUCAN) IVPB 200 mg     200 mg 100 mL/hr over 60 Minutes Intravenous Every 24 hours 10/13/13 1707 10/23/13 1759   10/13/13 1715  ertapenem (INVANZ) 1 g in sodium chloride 0.9 % 50 mL IVPB  Status:  Discontinued     1 g 100 mL/hr over 30 Minutes Intravenous Every 24 hours 10/13/13 1707 10/13/13 1750   10/12/13 1600  fluconazole (DIFLUCAN) tablet 100 mg  Status:  Discontinued     100 mg Oral Daily 10/12/13 1428 10/13/13 1707   10/12/13 1600  sulfamethoxazole-trimethoprim (BACTRIM DS) 800-160 MG per tablet 1 tablet  Status:  Discontinued     1 tablet Oral Daily 10/12/13 1428 10/13/13 1707   10/12/13 1600  ertapenem (INVANZ) 1 g in sodium chloride 0.9 % 50 mL IVPB     1 g 100 mL/hr over 30 Minutes Intravenous Every 24 hours 10/12/13 1507         Assessment/Plan  1. Perforated viscus, site unknown 2. Glioblastoma on steroids and Avastin  Plan: 1. I am very concerned about the fact that his drains can not keep their charge and are filling with air.  Also concerned that he has more bubbles and "foam" in his drains today.  This may suggest a continued leak.  Will d/w Dr. Biagio Quint.  He may end up having to be NPO and on TNA for weeks to allow this site to try and heal.   LOS: 8 days    OSBORNE,KELLY E 10/20/2013, 10:41 AM Pager: [phone]  Only the left side drain is  putting out the milky fluid.  He feels good on clears, denies any pain.  AF.  Wbc 13k.  Since he is tolerating liquids with no additional pain.  I guess that we could try advancing diet as tolerated if he would like. It is hard to make surgical recommendations when he does not want any surgery.

## 2013-10-20 NOTE — Progress Notes (Signed)
PARENTERAL NUTRITION CONSULT NOTE - Follow up  Pharmacy Consult for TPN Indication: Prolonged Ileus  Allergies  Allergen Reactions  . Dilaudid [Hydromorphone Hcl] Nausea And Vomiting  . Morphine And Related Nausea And Vomiting    Patient Measurements: Height: 6' (182.9 cm) Weight: 195 lb 5.2 oz (88.6 kg) IBW/kg (Calculated) : 77.6  Vital Signs: Temp: 97.9 F (36.6 C) (10/31 0623) Temp src: Oral (10/31 0623) BP: 145/99 mmHg (10/31 0623) Pulse Rate: 104 (10/31 0623) Intake/Output from previous day: 10/30 0701 - 10/31 0700 In: 4071.9 [P.O.:1720; I.V.:1056.3; IV Piggyback:305; TPN:990.7] Out: 2488 [Urine:2450; Drains:38]  Labs:  Recent Labs  10/19/13 0455 10/20/13 0512  WBC 13.2* 13.3*  HGB 12.0* 11.7*  HCT 34.7* 34.8*  PLT 233 256     Recent Labs  10/18/13 0610 10/19/13 0455 10/20/13 0340  NA 135 135 136  K 3.9 3.9 3.9  CL 100 99 100  CO2 25 24 25   GLUCOSE 142* 158* 166*  BUN 16 16 17   CREATININE 0.62 0.58 0.60  CALCIUM 9.0 9.0 9.1  MG  --  1.9  --   PHOS  --  3.1  --   PROT  --  6.2  --   ALBUMIN  --  2.4*  --   AST  --  24  --   ALT  --  41  --   ALKPHOS  --  81  --   BILITOT  --  0.3  --   TRIG 344*  --   --    Estimated Creatinine Clearance: 111.8 ml/min (by C-G formula based on Cr of 0.6).    Recent Labs  10/19/13 2028 10/20/13 0038 10/20/13 0357  GLUCAP 155* 125* 151*    Medications:  Infusions:  . Marland KitchenTPN (CLINIMIX-E) Adult 80 mL/hr at 10/19/13 1811  . 0.9 % NaCl with KCl 20 mEq / L 45 mL/hr (10/19/13 1904)   Nutritional Goals:   RD recs (10/28):  2300-2500 Kcal/day and 110-125 g protein/day  Clinimix 5/20 at a goal rate of 100 ml/hr + 20% fat emulsion at 10 ml/hr to provide: 120 g/day protein, 2592 Kcal/day.  Current nutrition:   Diet: NPO  IVF:  0.9% NaCl with 20 mEq KCl at 45 ml/hr  CBGs & Insulin requirements past 24 hours:   CBGs:  121 - 155; elevated after starting TPN  Sensitive SSI q6h:  15  units/24h  Assessment:  57 yo M admitted 10/23 with abdominal pain, found to have diverticulitis of colon with perforation.  S/p diagnostic laparoscopy on 10/24 with washout but perforation was not identified, 3 JP drains were placed.  Pt now with post op ileus, NGT to LIS, minimal BS, abdominal distention.  Pharmacy is consulted to start TPN for prolonged Ileus.  10/31: TPN D#5 - Clears started yesterday, tolerating OK per RN   Renal/hepatic function: SCr stable. Good UOP.  LFTs, Alk Phos and Bili wnl (10/28).  Electrolytes: WNL.  Corr Ca = 10.3 (10/30)  Pre-Albumin: 19.4 (10/24)  TG:   404 (10/28), 344 (10/29), 293 (10/31)  Above goal of TG < 150 after first day of IV lipids.  Glucose: above slightly above goal (suspect from TPN and start of CLQ) - SSI adjusted 10/28, no h/o DM  TPN Access: PICC placed 10/27  Plan:  At 1800 today  Continue Clinimix E 5/20 at 80 ml/hr (goal 161ml/hr)  Hold off advancing TPN to goal of 150ml/hr as diet being slowly advanced  As diet advanced and tolerating, adjust TPN accordingly  Watch CBGs, may need to add insulin to TPN  Triglycerides improving, still above goal < 150 - hold off lipids since diet started  If fails PO, acceptable to give lipids at least qweekly (with Trigs <500) to prevent FA deficiency  TNA to contain standard multivitamins and trace elements daily.  Continue moderate SSI q4h.  TNA lab panels on Mondays & Thursdays.  Pharmacy to f/u daily.  Juliette Alcide, PharmD, BCPS.   Pager: [phone] 10/20/2013 7:29 AM

## 2013-10-20 NOTE — Progress Notes (Signed)
[facility] PROGRESS NOTE  GRASON BRAILSFORD ZOX:[id number] DOB: [birth year] DOA: 10/12/2013 PCP: [name], MD  Assessment/Plan  Diverticulitis of colon with perforation.   -  Continue empiric ertapenem.  Plan was not to narrow antibiotics based on culture and to continue to treat broadly for bowel perforation until we know that the perforation has sealed. -  POD 5 s/p diagnostic laparoscopy with washout.  Perforation was not identified.  -  Body fluid culture:  E. 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  -  Anticipate long recovery -  Dr Malachi Bonds discussed pt with Onc and stated -No need for further PCP prophylaxis per Dr. Welton Flakes, Patient may live a long time per Dr. Welton Flakes -  See Dr Joan Mayans 10/28 note, talked with Dr Truett Perna 10/29 and he stated Dr Myna Hidalgo will likely be the one to see/follow up with pt.    Chronic steroid use.   Continue dexamethasone 2mg  IV BID  Hyperglycemia, likely stress response, and steroids.  Continue q6h insulin while on TNA and NPO  Thrush, stable.  Continue Diflucan.  CAD/HTN - diltiazem gtt DC'd today 10/30 at 10am after completing the planned 3days while awaiting clonidine patch, mildly tachycardia overall rate and blood pressure remains controlled at this time, will follow  -  Continue metoprolol 10 mg q6h -  Continue clonidine patch started 10/27 -  Patient not on statin or ASA - follow and change to PO meds soon if tolerated advance  diet  Hyponatremia and hypochloremia, were likely due to dehydration and fluids shifts. -  Resolved with hydration, follow  Mild leukocytosis -CT abd/pelvis last night looked stable and he is not having any further symptoms. - likely due to steroids -This WBC still trending up will also obtain UA, monitoring #1 and surgery also following   Mild anemia  likely secondary to acute illness. Hgb stable.   Diet:  continue TPN Access:  PIV Proph:  SCDs  Code Status: full Family Communication: spoke to patient alone Disposition Plan:  Transfer to telemetry   Consultants:  General Surgery, Dr. Derrell Lolling  Procedures:  Diagnostic Laparoscopy  Antibiotics:  Ertapenem 10/23 >>   HPI/Subjective:  Pt had  BM, he feels well, denies any new complaints, tolerating clears  Objective: Filed Vitals:   10/19/13 2140 10/20/13 0046 10/20/13 0623 10/20/13 1245  BP: 120/84 128/87 145/99 138/91  Pulse: 96 99 104 118  Temp: 97.4 F (36.3 C)  97.9 F (36.6 C)   TempSrc: Oral  Oral   Resp: 18  20   Height:      Weight:      SpO2: 98%  98%     Intake/Output Summary (Last 24 hours) at 10/20/13 1346 Last data filed at 10/20/13 0900  Gross per 24 hour  Intake 3202.91 ml  Output   2289 ml  Net 913.91 ml   Filed Weights   10/15/13 0400 10/16/13 0400 10/17/13 0500  Weight: 89.9 kg (198 lb 3.1 oz) 89.5 kg (197  lb 5 oz) 88.6 kg (195 lb 5.2 oz)    Exam:   General:  Alert and oriented x3 No acute distress  HEENT:  NCAT, MMM  Cardiovascular:  RRR, nl S1, S2 no mrg, 2+ pulses, warm extremities  Respiratory:  CTAB, no increased WOB  Abdomen:   + BS, soft, moderately distended, NT in the inferior quadrants without rebound or guarding.  Drains in place, purulence from upper left tube  MSK:   Normal tone and bulk, no LEE  Neuro:  Grossly intact  Data Reviewed: Basic Metabolic Panel:  Recent Labs Lab 10/16/13 0404 10/17/13 0458 10/18/13 0610 10/19/13 0455 10/20/13 0340  NA  134* 133* 135 135 136  K 4.3 3.9 3.9 3.9 3.9  CL 97 94* 100 99 100  CO2 23 26 25 24 25   GLUCOSE 105* 195* 142* 158* 166*  BUN 17 15 16 16 17   CREATININE 0.63 0.63 0.62 0.58 0.60  CALCIUM 8.7 9.4 9.0 9.0 9.1  MG  --  2.0  --  1.9  --   PHOS  --  3.1  --  3.1  --    Liver Function Tests:  Recent Labs Lab 10/17/13 0458 10/19/13 0455  AST 17 24  ALT 34 41  ALKPHOS 83 81  BILITOT 0.2* 0.3  PROT 6.6 6.2  ALBUMIN 2.5* 2.4*   No results found for this basename: LIPASE, AMYLASE,  in the last 168 hours No results found for this basename: AMMONIA,  in the last 168 hours CBC:  Recent Labs Lab 10/14/13 0310 10/15/13 0340 10/17/13 0458 10/19/13 0455 10/20/13 0512  WBC 9.9 10.0 11.9* 13.2* 13.3*  NEUTROABS  --   --  10.6*  --   --   HGB 11.6* 11.8* 12.9* 12.0* 11.7*  HCT 34.3* 35.7* 37.6* 34.7* 34.8*  MCV 98.3 98.3 96.4 97.2 96.9  PLT 220 213 259 233 256   Cardiac Enzymes: No results found for this basename: CKTOTAL, CKMB, CKMBINDEX, TROPONINI,  in the last 168 hours BNP (last 3 results) No results found for this basename: PROBNP,  in the last 8760 hours CBG:  Recent Labs Lab 10/19/13 2028 10/20/13 0038 10/20/13 0357 10/20/13 0739 10/20/13 1135  GLUCAP 155* 125* 151* 141* 127*    Recent Results (from the past 240 hour(s))  SURGICAL PCR SCREEN     Status: None   Collection Time    10/13/13 12:37 PM      Result Value Range Status   MRSA, PCR NEGATIVE  NEGATIVE Final   Staphylococcus aureus NEGATIVE  NEGATIVE Final   Comment:            The Xpert SA Assay (FDA     approved for NASAL specimens     in patients over 26 years of age),     is one component of     a comprehensive surveillance     program.  Test performance has     been validated by [employer] for patients greater     than or equal to 55 year old.     It is not intended     to diagnose infection nor to     guide or monitor treatment.  ANAEROBIC CULTURE     Status: None   Collection Time     10/13/13  2:51 PM      Result Value Range Status   Specimen Description FLUID ADBOMINAL   Final   Special Requests NONE   Final  Gram Stain     Final   Value: ABUNDANT WBC PRESENT,BOTH PMN AND MONONUCLEAR     NO SQUAMOUS EPITHELIAL CELLS SEEN     NO ORGANISMS SEEN     Performed at [facility]   Culture     Final   Value: NO ANAEROBES ISOLATED     Performed at [facility]   Report Status 10/18/2013 FINAL   Final  BODY FLUID CULTURE     Status: None   Collection Time    10/13/13  2:51 PM      Result Value Range Status   Specimen Description FLUID ADBOMINAL   Final   Special Requests NONE   Final   Gram Stain     Final   Value: MODERATE WBC PRESENT, PREDOMINANTLY PMN     NO ORGANISMS SEEN     Performed at [facility]   Culture     Final   Value: MODERATE ESCHERICHIA COLI     Performed at [facility]   Report Status 10/16/2013 FINAL   Final   Organism ID, Bacteria ESCHERICHIA COLI   Final     Studies: No results found.  Scheduled Meds: . antiseptic oral rinse  15 mL Mouth Rinse q12n4p  . calcium carbonate  1 tablet Oral Daily  . chlorhexidine  15 mL Mouth Rinse BID  . cloNIDine  0.1 mg Transdermal Weekly  . dexamethasone  2 mg Intravenous Q12H  . enoxaparin (LOVENOX) injection  40 mg Subcutaneous Q24H  . ertapenem (INVANZ) IV  1 g Intravenous Q24H  . fentaNYL  25 mcg Transdermal Q72H  . fluconazole (DIFLUCAN) IV  200 mg Intravenous Q24H  . insulin aspart  0-15 Units Subcutaneous Q4H  . levETIRAcetam  500 mg Intravenous Q24H  . metoprolol  10 mg Intravenous Q6H  . pantoprazole (PROTONIX) IV  40 mg Intravenous Q12H  . sodium chloride  10-40 mL Intracatheter Q12H   Continuous Infusions: . Marland KitchenTPN (CLINIMIX-E) Adult 80 mL/hr at 10/19/13 1811  . Marland KitchenTPN (CLINIMIX-E) Adult    . 0.9 % NaCl with KCl 20 mEq / L 45 mL/hr (10/19/13 1904)    Principal Problem:   Diverticulitis of colon with perforation Active Problems:   Right sided weakness    Hyperglycemia   Thrush    Time spent: 25 min    Calene Paradiso C  [facility] Pager [phone]. If 7PM-7AM, please contact night-coverage at [email or web address], password [facility] 10/20/2013, 1:46 PM  LOS: 8 days

## 2013-10-20 NOTE — Progress Notes (Signed)
Received message from Tiffany in [facility] that pt wanted to transfer his care to Dr. Myna Hidalgo.  Ok per Dr. Myna Hidalgo and Tiffany notified.

## 2013-10-20 NOTE — Progress Notes (Signed)
Report from Merla Riches, RN. Pt sitting up in bed, watching TV. Offers no c/o. Denies abdd pain/nausea. JP x3 to abd charged and draining w/ dsgs c/d/i. RT PICC intact w/ TPN and IVF infusing. Dr Biagio Quint in to see pt and evaluate drains. SCDs in place, pt tolerating clears. No c/o at present.

## 2013-10-21 LAB — URINE CULTURE
Colony Count: NO GROWTH
Culture: NO GROWTH

## 2013-10-21 LAB — BASIC METABOLIC PANEL
CO2: 25 mEq/L (ref 19–32)
Calcium: 9.4 mg/dL (ref 8.4–10.5)
Chloride: 99 mEq/L (ref 96–112)
GFR calc non Af Amer: >90 mL/min (ref >90)
Glucose, Bld: 153 mg/dL — ABNORMAL HIGH (ref 70–99)
Potassium: 4.2 mEq/L (ref 3.5–5.1)
Sodium: 133 mEq/L — ABNORMAL LOW (ref 135–145)

## 2013-10-21 LAB — GLUCOSE, CAPILLARY
Glucose-Capillary: 103 mg/dL — ABNORMAL HIGH (ref 70–99)
Glucose-Capillary: 138 mg/dL — ABNORMAL HIGH (ref 70–99)
Glucose-Capillary: 138 mg/dL — ABNORMAL HIGH (ref 70–99)
Glucose-Capillary: 149 mg/dL — ABNORMAL HIGH (ref 70–99)
Glucose-Capillary: 162 mg/dL — ABNORMAL HIGH (ref 70–99)

## 2013-10-21 MED ORDER — INSULIN ASPART 100 UNIT/ML ~~LOC~~ SOLN
0.0000 [IU] | SUBCUTANEOUS | Status: DC
  Administered 2013-10-21: 21:00:00 3 [IU] via SUBCUTANEOUS
  Administered 2013-10-21 – 2013-10-22 (×3): 4 [IU] via SUBCUTANEOUS
  Administered 2013-10-22 – 2013-10-23 (×5): 3 [IU] via SUBCUTANEOUS
  Administered 2013-10-23 – 2013-10-24 (×4): 4 [IU] via SUBCUTANEOUS
  Administered 2013-10-24: 3 [IU] via SUBCUTANEOUS
  Administered 2013-10-24: 04:00:00 4 [IU] via SUBCUTANEOUS
  Administered 2013-10-24 (×2): 3 [IU] via SUBCUTANEOUS
  Administered 2013-10-25: 7 [IU] via SUBCUTANEOUS
  Administered 2013-10-25: 08:00:00 4 [IU] via SUBCUTANEOUS
  Administered 2013-10-25: 5 [IU] via SUBCUTANEOUS
  Administered 2013-10-25 (×4): 4 [IU] via SUBCUTANEOUS
  Administered 2013-10-26: 05:00:00 via SUBCUTANEOUS
  Administered 2013-10-26: 09:00:00 3 [IU] via SUBCUTANEOUS

## 2013-10-21 MED ORDER — TRACE MINERALS CR-CU-F-FE-I-MN-MO-SE-ZN IV SOLN
INTRAVENOUS | Status: AC
  Administered 2013-10-21: 18:00:00 via INTRAVENOUS
  Filled 2013-10-21: qty 2000

## 2013-10-21 NOTE — Progress Notes (Signed)
Patient ID: Martin Martin, male   DOB: 10/26/1956, 57 y.o.   MRN: [id number] 8 Days Post-Op  Subjective: Pt feels well today.  No complaints.  Tolerating clear liquids and had a BM  Objective: Vital signs in last 24 hours: Temp:  [97.4 F (36.3 C)-97.8 F (36.6 C)] 97.8 F (36.6 C) (11/01 0419) Pulse Rate:  [92-118] 92 (11/01 0419) Resp:  [18-20] 18 (11/01 0419) BP: (121-154)/(74-100) 154/100 mmHg (11/01 0419) SpO2:  [98 %-99 %] 98 % (11/01 0419) Last BM Date: 10/19/13  Intake/Output from previous day: 10/31 0701 - 11/01 0700 In: 2842.8 [P.O.:1140; I.V.:804.8; IV Piggyback:150; TPN:748] Out: 1061 [Urine:1050; Drains:10; Stool:1] Intake/Output this shift: Total I/O In: -  Out: 300 [Urine:300]  PE: Abd: soft, NT, incisions are c/d/i, JP 2 and 3 won't hold charge for a significant period of time.  One of those drains has some serous output today, but also has air bubbles present.  JP 1 still has purulent material  Lab Results:   Recent Labs  10/19/13 0455 10/20/13 0512  WBC 13.2* 13.3*  HGB 12.0* 11.7*  HCT 34.7* 34.8*  PLT 233 256   BMET  Recent Labs  10/20/13 0340 10/21/13 0440  NA 136 133*  K 3.9 4.2  CL 100 99  CO2 25 25  GLUCOSE 166* 153*  BUN 17 16  CREATININE 0.60 0.60  CALCIUM 9.1 9.4   PT/INR No results found for this basename: LABPROT, INR,  in the last 72 hours CMP     Component Value Date/Time   NA 133* 10/21/2013 0440   NA 141 10/09/2013 1300   K 4.2 10/21/2013 0440   K 4.4 10/09/2013 1300   CL 99 10/21/2013 0440   CL 102 06/05/2013 1406   CO2 25 10/21/2013 0440   CO2 21* 10/09/2013 1300   GLUCOSE 153* 10/21/2013 0440   GLUCOSE 181* 10/09/2013 1300   GLUCOSE 165* 06/05/2013 1406   BUN 16 10/21/2013 0440   BUN 19.6 10/09/2013 1300   CREATININE 0.60 10/21/2013 0440   CREATININE 0.7 10/09/2013 1300   CALCIUM 9.4 10/21/2013 0440   CALCIUM 9.3 10/09/2013 1300   PROT 6.2 10/19/2013 0455   PROT 6.9 10/09/2013 1300   ALBUMIN 2.4* 10/19/2013 0455    ALBUMIN 2.6* 10/09/2013 1300   AST 24 10/19/2013 0455   AST 30 10/09/2013 1300   ALT 41 10/19/2013 0455   ALT 124* 10/09/2013 1300   ALKPHOS 81 10/19/2013 0455   ALKPHOS 89 10/09/2013 1300   BILITOT 0.3 10/19/2013 0455   BILITOT 0.35 10/09/2013 1300   GFRNONAA >90 10/21/2013 0440   GFRAA >90 10/21/2013 0440   Lipase     Component Value Date/Time   LIPASE 39 10/12/2013 1030       Studies/Results: No results found.  Anti-infectives: Anti-infectives   Start     Dose/Rate Route Frequency Ordered Stop   10/13/13 1800  fluconazole (DIFLUCAN) IVPB 200 mg     200 mg 100 mL/hr over 60 Minutes Intravenous Every 24 hours 10/13/13 1707 10/23/13 1759   10/13/13 1715  ertapenem (INVANZ) 1 g in sodium chloride 0.9 % 50 mL IVPB  Status:  Discontinued     1 g 100 mL/hr over 30 Minutes Intravenous Every 24 hours 10/13/13 1707 10/13/13 1750   10/12/13 1600  fluconazole (DIFLUCAN) tablet 100 mg  Status:  Discontinued     100 mg Oral Daily 10/12/13 1428 10/13/13 1707   10/12/13 1600  sulfamethoxazole-trimethoprim (BACTRIM DS) 800-160 MG per tablet 1  tablet  Status:  Discontinued     1 tablet Oral Daily 10/12/13 1428 10/13/13 1707   10/12/13 1600  ertapenem (INVANZ) 1 g in sodium chloride 0.9 % 50 mL IVPB     1 g 100 mL/hr over 30 Minutes Intravenous Every 24 hours 10/12/13 1507         Assessment/Plan  1. Perforated viscus, site unknown 2. Glioblastoma on steroids and Avastin  Plan: 1. Patient doing well with drains intact.  Tolerating clears.  Cont antibiotics for intra-abd infection.  Will advance to full liquids today and monitor his labs and vitals closely.  LOS: 9 days    Kataleena Holsapple C. 10/21/2013, 8:28 AM

## 2013-10-21 NOTE — Progress Notes (Signed)
PARENTERAL NUTRITION CONSULT NOTE - Follow up  Pharmacy Consult for TPN Indication: Prolonged Ileus  Allergies  Allergen Reactions  . Dilaudid [Hydromorphone Hcl] Nausea And Vomiting  . Morphine And Related Nausea And Vomiting    Patient Measurements: Height: 6' (182.9 cm) Weight: 195 lb 5.2 oz (88.6 kg) IBW/kg (Calculated) : 77.6  Vital Signs: Temp: 97.8 F (36.6 C) (11/01 0419) Temp src: Oral (11/01 0419) BP: 154/100 mmHg (11/01 0419) Pulse Rate: 92 (11/01 0419) Intake/Output from previous day: 10/31 0701 - 11/01 0700 In: 2842.8 [P.O.:1140; I.V.:804.8; IV Piggyback:150; TPN:748] Out: 1061 [Urine:1050; Drains:10; Stool:1]  Labs:  Recent Labs  10/19/13 0455 10/20/13 0512  WBC 13.2* 13.3*  HGB 12.0* 11.7*  HCT 34.7* 34.8*  PLT 233 256     Recent Labs  10/19/13 0455 10/20/13 0340 10/21/13 0440  NA 135 136 133*  K 3.9 3.9 4.2  CL 99 100 99  CO2 24 25 25   GLUCOSE 158* 166* 153*  BUN 16 17 16   CREATININE 0.58 0.60 0.60  CALCIUM 9.0 9.1 9.4  MG 1.9  --   --   PHOS 3.1  --   --   PROT 6.2  --   --   ALBUMIN 2.4*  --   --   AST 24  --   --   ALT 41  --   --   ALKPHOS 81  --   --   BILITOT 0.3  --   --   TRIG  --  293*  --    Estimated Creatinine Clearance: 111.8 ml/min (by C-G formula based on Cr of 0.6).    Recent Labs  10/21/13 0012 10/21/13 0416 10/21/13 0527  GLUCAP 138* 149* 138*    Medications:  Infusions:  . Marland KitchenTPN (CLINIMIX-E) Adult 80 mL/hr at 10/20/13 1722  . 0.9 % NaCl with KCl 20 mEq / L 45 mL/hr at 10/21/13 0000   Nutritional Goals:   RD recs (10/28):  2300-2500 Kcal/day and 110-125 g protein/day  Clinimix 5/20 at a goal rate of 100 ml/hr + 20% fat emulsion at 10 ml/hr to provide: 120 g/day protein, 2592 Kcal/day.  Current nutrition:   Diet: 10/30 CLD  IVF:  0.9% NaCl with 20 mEq KCl at 45 ml/hr  CBGs & Insulin requirements past 24 hours:   CBGs:  123-161; elevated after starting TPN, will monitor  Moderate SSI q6h:  13  units/24h  Assessment:  57 yo M admitted 10/23 with abdominal pain, found to have diverticulitis of colon with perforation.  S/p diagnostic laparoscopy on 10/24 with washout but perforation was not identified, 3 JP drains were placed.  Pt now with post op ileus, NGT to LIS, minimal BS, abdominal distention.  Pharmacy is consulted to start TPN for prolonged Ileus.  11/1: TPN D#6 - Clears started yesterday, tolerating OK per RN   Renal/hepatic function: SCr stable. LFTs, Alk Phos and Bili wnl (10/28).  Electrolytes: WNL.    Pre-Albumin: 19.4 (10/24)  TG:   404 (10/28), 344 (10/29), 293 (10/31)  Above goal of TG < 150 after first day of IV lipids.  Glucose: above slightly above goal (suspect from TPN and start of CLQ) no h/o DM  TPN Access: PICC placed 10/27  Plan:  At 1800 today  Continue Clinimix E 5/20 at 80 ml/hr (goal 154ml/hr)  Hold off advancing TPN to goal of 132ml/hr as diet being slowly advanced  As diet advanced and tolerating, adjust TPN accordingly  Watch CBGs, increase SSI to Resistant Scale  Q4h  Triglycerides improving, still above goal < 150 - hold off lipids since diet started  If fails PO, acceptable to give lipids at least qweekly (with Trigs <500) to prevent FA deficiency  TNA to contain standard multivitamins and trace elements daily.  TNA lab panels on Mondays & Thursdays.  Pharmacy to f/u daily.  BorgerdingLoma Messing PharmD Pager #: [phone] 8:13 AM 10/21/2013

## 2013-10-21 NOTE — Progress Notes (Signed)
[facility] PROGRESS NOTE  Martin Martin ZOX:[id number] DOB: [birth year] DOA: 10/12/2013 PCP: [name], MD  Assessment/Plan  Diverticulitis of colon with perforation.   -  Continue empiric ertapenem.  Plan was not to narrow antibiotics based on culture and to continue to treat broadly for bowel perforation until we know that the perforation has sealed. -  POD 5 s/p diagnostic laparoscopy with washout.  Perforation was not identified.  -  Body fluid culture:  E. 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  -  Anticipate long recovery -  Dr Malachi Bonds discussed pt with Onc and stated -No need for further PCP prophylaxis per Dr. Welton Flakes, Patient may live a long time per Dr. Welton Flakes -  See Dr Joan Mayans 10/28 note, talked with Dr Truett Perna 10/29 and he stated Dr Myna Hidalgo will likely be the one to see/follow up with pt.    Chronic steroid use.   Continue dexamethasone 2mg  IV BID  Hyperglycemia, likely stress response, and steroids.  Continue q6h insulin while on TNA and NPO  Thrush, stable.  Continue Diflucan.  CAD/HTN - diltiazem gtt DC'd today 10/30 at 10am after completing the planned 3days while awaiting clonidine patch, mildly tachycardia overall rate and blood pressure remains controlled at this time, will follow  -  Continue metoprolol 10 mg q6h -  Continue clonidine patch started 10/27 -  Patient not on statin or ASA - follow and change to PO meds soon if tolerated advance diet  Hyponatremia  and hypochloremia, were likely due to dehydration and fluids shifts. -  Resolved with hydration, follow  Mild leukocytosis -CT abd/pelvis last night looked stable and he is not having any further symptoms. - likely due to steroids -repeat UA was neg, monitoring #1 and surgery also following  -follow and recheck in am  Mild anemia  likely secondary to acute illness. Hgb stable.   Diet:  continue TPN Access:  PIV Proph:  SCDs  Code Status: full Family Communication: spoke to patient alone Disposition Plan:  Transfer to telemetry   Consultants:  General Surgery, Dr. Derrell Lolling  Procedures:  Diagnostic Laparoscopy  Antibiotics:  Ertapenem 10/23 >>   HPI/Subjective:  States he feels well, had BM, denies any new complaints, tolerating full liquids  Objective: Filed Vitals:   10/20/13 1245 10/20/13 1407 10/20/13 1700 10/21/13 0419  BP: 138/91 121/74 135/84 154/100  Pulse: 118 98 113 92  Temp:  97.4 F (36.3 C)  97.8 F (36.6 C)  TempSrc:  Oral  Oral  Resp:  18 20 18   Height:      Weight:      SpO2:  99% 98% 98%    Intake/Output Summary (Last 24 hours) at 10/21/13 1316 Last data filed at 10/21/13 1122  Gross per 24 hour  Intake 2722.75 ml  Output   1835 ml  Net 887.75 ml   Filed Weights   10/15/13 0400 10/16/13 0400 10/17/13 0500  Weight: 89.9 kg (198 lb 3.1 oz) 89.5 kg (197 lb 5 oz)  88.6 kg (195 lb 5.2 oz)    Exam:   General:  Alert and oriented x3 No acute distress  HEENT:  NCAT, MMM  Cardiovascular:  RRR, nl S1, S2 no mrg, 2+ pulses, warm extremities  Respiratory:  CTAB, no increased WOB  Abdomen:   + BS, soft, moderately distended, NT in the inferior quadrants without rebound or guarding.  Drains in place, purulence from upper left tube  MSK:   Normal tone and bulk, no LEE  Neuro:  Grossly intact  Data Reviewed: Basic Metabolic Panel:  Recent Labs Lab 10/16/13 0404 10/17/13 0458 10/18/13 0610 10/19/13 0455 10/20/13 0340 10/21/13 0440   NA 134* 133* 135 135 136 133*  K 4.3 3.9 3.9 3.9 3.9 4.2  CL 97 94* 100 99 100 99  CO2 23 26 25 24 25 25   GLUCOSE 105* 195* 142* 158* 166* 153*  BUN 17 15 16 16 17 16   CREATININE 0.63 0.63 0.62 0.58 0.60 0.60  CALCIUM 8.7 9.4 9.0 9.0 9.1 9.4  MG  --  2.0  --  1.9  --   --   PHOS  --  3.1  --  3.1  --   --    Liver Function Tests:  Recent Labs Lab 10/17/13 0458 10/19/13 0455  AST 17 24  ALT 34 41  ALKPHOS 83 81  BILITOT 0.2* 0.3  PROT 6.6 6.2  ALBUMIN 2.5* 2.4*   No results found for this basename: LIPASE, AMYLASE,  in the last 168 hours No results found for this basename: AMMONIA,  in the last 168 hours CBC:  Recent Labs Lab 10/15/13 0340 10/17/13 0458 10/19/13 0455 10/20/13 0512  WBC 10.0 11.9* 13.2* 13.3*  NEUTROABS  --  10.6*  --   --   HGB 11.8* 12.9* 12.0* 11.7*  HCT 35.7* 37.6* 34.7* 34.8*  MCV 98.3 96.4 97.2 96.9  PLT 213 259 233 256   Cardiac Enzymes: No results found for this basename: CKTOTAL, CKMB, CKMBINDEX, TROPONINI,  in the last 168 hours BNP (last 3 results) No results found for this basename: PROBNP,  in the last 8760 hours CBG:  Recent Labs Lab 10/21/13 0012 10/21/13 0416 10/21/13 0527 10/21/13 0817 10/21/13 1153  GLUCAP 138* 149* 138* 111* 103*    Recent Results (from the past 240 hour(s))  SURGICAL PCR SCREEN     Status: None   Collection Time    10/13/13 12:37 PM      Result Value Range Status   MRSA, PCR NEGATIVE  NEGATIVE Final   Staphylococcus aureus NEGATIVE  NEGATIVE Final   Comment:            The Xpert SA Assay (FDA     approved for NASAL specimens     in patients over 69 years of age),     is one component of     a comprehensive surveillance     program.  Test performance has     been validated by [employer] for patients greater     than or equal to 54 year old.     It is not intended     to diagnose infection nor to     guide or monitor treatment.  ANAEROBIC CULTURE     Status: None   Collection Time     10/13/13  2:51 PM      Result Value Range Status   Specimen Description FLUID ADBOMINAL   Final   Special Requests NONE  Final   Gram Stain     Final   Value: ABUNDANT WBC PRESENT,BOTH PMN AND MONONUCLEAR     NO SQUAMOUS EPITHELIAL CELLS SEEN     NO ORGANISMS SEEN     Performed at [facility]   Culture     Final   Value: NO ANAEROBES ISOLATED     Performed at [facility]   Report Status 10/18/2013 FINAL   Final  BODY FLUID CULTURE     Status: None   Collection Time    10/13/13  2:51 PM      Result Value Range Status   Specimen Description FLUID ADBOMINAL   Final   Special Requests NONE   Final   Gram Stain     Final   Value: MODERATE WBC PRESENT, PREDOMINANTLY PMN     NO ORGANISMS SEEN     Performed at [facility]   Culture     Final   Value: MODERATE ESCHERICHIA COLI     Performed at [facility]   Report Status 10/16/2013 FINAL   Final   Organism ID, Bacteria ESCHERICHIA COLI   Final  URINE CULTURE     Status: None   Collection Time    10/20/13  6:30 AM      Result Value Range Status   Specimen Description URINE, CLEAN CATCH   Final   Special Requests NONE   Final   Culture  Setup Time     Final   Value: 10/20/2013 09:23     Performed at [facility] Count     Final   Value: NO GROWTH     Performed at [facility]   Culture     Final   Value: NO GROWTH     Performed at [facility]   Report Status 10/21/2013 FINAL   Final     Studies: No results found.  Scheduled Meds: . antiseptic oral rinse  15 mL Mouth Rinse q12n4p  . calcium carbonate  1 tablet Oral Daily  . chlorhexidine  15 mL Mouth Rinse BID  . cloNIDine  0.1 mg Transdermal Weekly  . dexamethasone  2 mg Intravenous Q12H  . enoxaparin (LOVENOX) injection  40 mg Subcutaneous Q24H  . ertapenem (INVANZ) IV  1 g Intravenous Q24H  . fentaNYL  25 mcg Transdermal Q72H  . fluconazole (DIFLUCAN) IV  200 mg Intravenous Q24H  . insulin aspart   0-20 Units Subcutaneous Q4H  . levETIRAcetam  500 mg Intravenous Q24H  . metoprolol  10 mg Intravenous Q6H  . pantoprazole (PROTONIX) IV  40 mg Intravenous Q12H  . sodium chloride  10-40 mL Intracatheter Q12H   Continuous Infusions: . Marland KitchenTPN (CLINIMIX-E) Adult 80 mL/hr at 10/20/13 1722  . Marland KitchenTPN (CLINIMIX-E) Adult    . 0.9 % NaCl with KCl 20 mEq / L 45 mL/hr at 10/21/13 0000    Principal Problem:   Diverticulitis of colon with perforation Active Problems:   Right sided weakness   Hyperglycemia   Thrush    Time spent: 25 min    Odessie Polzin C  [facility] Pager [phone]. If 7PM-7AM, please contact night-coverage at [email or web address], password [facility] 10/21/2013, 1:16 PM  LOS: 9 days

## 2013-10-21 NOTE — Anesthesia Postprocedure Evaluation (Signed)
Anesthesia Post Note  Patient: Martin Martin  Procedure(s) Performed: Procedure(s) (LRB): DIAGNOSTIC LAPAROSCOPY and Placement of Drains (N/A)  Anesthesia type: General  Patient location: PACU  Post pain: Pain level controlled  Post assessment: Post-op Vital signs reviewed  Last Vitals: BP 154/100  Pulse 92  Temp(Src) 36.6 C (Oral)  Resp 18  Ht 6' (1.829 m)  Wt 195 lb 5.2 oz (88.6 kg)  BMI 26.49 kg/m2  SpO2 98%  Post vital signs: Reviewed  Level of consciousness: sedated  Complications: No apparent anesthesia complications

## 2013-10-22 ENCOUNTER — Inpatient Hospital Stay (HOSPITAL_COMMUNITY): Payer: BC Managed Care – PPO

## 2013-10-22 LAB — CBC
HCT: 34.2 % — ABNORMAL LOW (ref 39.0–52.0)
Hemoglobin: 11.4 g/dL — ABNORMAL LOW (ref 13.0–17.0)
MCHC: 33.3 g/dL (ref 30.0–36.0)
MCV: 97.7 fL (ref 78.0–100.0)
Platelets: 261 10*3/uL (ref 150–400)
RBC: 3.5 MIL/uL — ABNORMAL LOW (ref 4.22–5.81)
RDW: 16.4 % — ABNORMAL HIGH (ref 11.5–15.5)
WBC: 14.7 10*3/uL — ABNORMAL HIGH (ref 4.0–10.5)

## 2013-10-22 LAB — BASIC METABOLIC PANEL
BUN: 15 mg/dL (ref 6–23)
CO2: 26 mEq/L (ref 19–32)
Calcium: 9 mg/dL (ref 8.4–10.5)
Chloride: 98 mEq/L (ref 96–112)
Creatinine, Ser: 0.55 mg/dL (ref 0.50–1.35)

## 2013-10-22 LAB — GLUCOSE, CAPILLARY: Glucose-Capillary: 116 mg/dL — ABNORMAL HIGH (ref 70–99)

## 2013-10-22 MED ORDER — LORAZEPAM 2 MG/ML IJ SOLN
1.0000 mg | Freq: Once | INTRAMUSCULAR | Status: AC
  Administered 2013-10-22: 1 mg via INTRAVENOUS

## 2013-10-22 MED ORDER — LORAZEPAM 1 MG PO TABS: 1.0000 mg | ORAL_TABLET | Freq: Four times a day (QID) | ORAL | Status: DC | PRN

## 2013-10-22 MED ORDER — TRACE MINERALS CR-CU-F-FE-I-MN-MO-SE-ZN IV SOLN
INTRAVENOUS | Status: AC
  Administered 2013-10-22: 17:00:00 via INTRAVENOUS
  Filled 2013-10-22: qty 2000

## 2013-10-22 MED ORDER — DEXAMETHASONE SODIUM PHOSPHATE 4 MG/ML IJ SOLN
4.0000 mg | Freq: Once | INTRAMUSCULAR | Status: AC
  Administered 2013-10-22: 4 mg via INTRAVENOUS
  Filled 2013-10-22: qty 1

## 2013-10-22 MED ORDER — DEXAMETHASONE SODIUM PHOSPHATE 4 MG/ML IJ SOLN
4.0000 mg | Freq: Two times a day (BID) | INTRAMUSCULAR | Status: DC
  Administered 2013-10-23 (×2): 4 mg via INTRAVENOUS
  Filled 2013-10-22 (×4): qty 1

## 2013-10-22 MED ORDER — SODIUM CHLORIDE 0.9 % IV SOLN
1000.0000 mg | Freq: Once | INTRAVENOUS | Status: AC
  Administered 2013-10-22: 14:00:00 1000 mg via INTRAVENOUS
  Filled 2013-10-22: qty 10

## 2013-10-22 MED ORDER — LORAZEPAM 2 MG/ML IJ SOLN
INTRAMUSCULAR | Status: AC
  Administered 2013-10-22: 1 mg via INTRAVENOUS
  Filled 2013-10-22: qty 1

## 2013-10-22 MED ORDER — SODIUM CHLORIDE 0.9 % IV SOLN
500.0000 mg | Freq: Two times a day (BID) | INTRAVENOUS | Status: DC
  Administered 2013-10-22 – 2013-10-23 (×2): 500 mg via INTRAVENOUS
  Filled 2013-10-22 (×2): qty 5

## 2013-10-22 NOTE — Consult Note (Signed)
Neurology Consultation Reason for Consult: Seizure Referring Physician: [email or web address], a  CC: Seizure  History is obtained from: Patient, wife  HPI: Martin Martin is a 57 y.o. male with a history of glioblastoma s/p debulking/biopsy in 03/2013 as well as radiation/temodar. In September he was started on Avastin due to progression. HE has had worsening right sided weakness for which he was started on decadron and this has been tapered down to his current dose of 2mg  BID.   Over the past couple of days since recently tapering from 4mg  BID, he has had increasing difficulty with speech and confusion.   He is currently admitted for peritonitis with bowel perforation.   ROS: A 14 point ROS was performed and is negative except as noted in the HPI.  Past Medical History  Diagnosis Date  . Hyperlipidemia   . Right bundle branch block     Normal LV function normal aortic and mitral valve bedside echocardiogram 4 2012  . Tachycardia     Resolved  . Dyslipidemia   . Coronary artery disease (CAD) excluded     Cardiolite study 2006 within normal limits  . Hypertension   . Allergy   . Cancer     Family History: No hx sz  Social History: Tob: remote hx  Exam: Current vital signs: BP 138/99  Pulse 103  Temp(Src) 98.2 F (36.8 C) (Oral)  Resp 18  Ht 6' (1.829 m)  Wt 88.6 kg (195 lb 5.2 oz)  BMI 26.49 kg/m2  SpO2 99% Vital signs in last 24 hours: Temp:  [97.7 F (36.5 C)-98.3 F (36.8 C)] 98.2 F (36.8 C) (11/02 0420) Pulse Rate:  [102-113] 103 (11/02 0420) Resp:  [18] 18 (11/02 0420) BP: (124-138)/(77-99) 138/99 mmHg (11/02 0420) SpO2:  [98 %-99 %] 99 % (11/02 0420)  General: in bed, NAD CV: RRR Mental Status: Patient is awake, alert, oriented to person, place(but takes a while to come up with the name [facility], initially states [place]), month, year, and situation. He has a mixed mild aphasia with a mild difficulty following complex commands.  Cranial Nerves: II: Visual Fields  are full. Pupils are equal, round, and reactive to light.  Discs are difficult to visualize. III,IV, VI: EOMI without ptosis or diploplia.  V: Facial sensation is symmetric to temperature VII: Facial movement is symmetric.  VIII: hearing is intact to voice X: Uvula elevates symmetrically XI: Shoulder shrug is symmetric. XII: tongue is midline without atrophy or fasciculations.  Motor: Tone is normal. Bulk is normal. 5/5 strength was present in all four extremities.  Sensory: Sensation is symmetric to light touch and temperature in the arms and legs. Deep Tendon Reflexes: 2+ and symmetric in the biceps and patellae.  Plantars: Toes are downgoing bilaterally.  Cerebellar: FNF intact bilaterally Gait: Not tested due to pt safety concerns.   I have reviewed labs in epic and the results pertinent to this consultation are: Mild hyponatremia  I have reviewed the images obtained:CT head - slightly improved edema compared to 9/19  Impression: 57 yo M with GBM and seizures and worsening aphasia following downtitration of steroids. I have loaded with keppra and will start him on BID dosing of this medication. I suspect that after decreasing his decadron he has had some recurrence of edema contributing to his symptoms. Some of his symptoms may be partial seizures, and therefore keppra alone might give him some benefit.   Recommendations: 1) If reasonable from an intrabdominal perspective, increasing decadron to 4mg  BID may  be helpful.  2) Increase keppra to 500mg  BID.  3) Would discuss likely worsening with oncology, not sure what treatment options he has with his intraabdominal process right now.  4) May be reasonable to involve palliative care.     Martin Rack, MD [facility] [phone]  If 7pm- 7am, please page neurology on call at [phone].

## 2013-10-22 NOTE — Progress Notes (Signed)
PARENTERAL NUTRITION CONSULT NOTE - Follow up  Pharmacy Consult for TPN Indication: Prolonged Ileus  Allergies  Allergen Reactions  . Dilaudid [Hydromorphone Hcl] Nausea And Vomiting  . Morphine And Related Nausea And Vomiting    Patient Measurements: Height: 6' (182.9 cm) Weight: 195 lb 5.2 oz (88.6 kg) IBW/kg (Calculated) : 77.6  Vital Signs: Temp: 98.2 F (36.8 C) (11/02 0420) Temp src: Oral (11/02 0420) BP: 138/99 mmHg (11/02 0420) Pulse Rate: 103 (11/02 0420) Intake/Output from previous day: 11/01 0701 - 11/02 0700 In: 2815.8 [P.O.:360; I.V.:1135.8; IV Piggyback:360; TPN:960] Out: 1835 [Urine:1775; Drains:60]  Labs:  Recent Labs  10/20/13 0512 10/22/13 0530  WBC 13.3* 14.7*  HGB 11.7* 11.4*  HCT 34.8* 34.2*  PLT 256 261     Recent Labs  10/20/13 0340 10/21/13 0440 10/22/13 0530  NA 136 133* 134*  K 3.9 4.2 3.7  CL 100 99 98  CO2 25 25 26   GLUCOSE 166* 153* 164*  BUN 17 16 15   CREATININE 0.60 0.60 0.55  CALCIUM 9.1 9.4 9.0  TRIG 293*  --   --    Estimated Creatinine Clearance: 111.8 ml/min (by C-G formula based on Cr of 0.55).    Recent Labs  10/21/13 2005 10/21/13 2353 10/22/13 0725  GLUCAP 124* 137* 116*    Medications:  Infusions:  . Marland KitchenTPN (CLINIMIX-E) Adult 80 mL/hr at 10/21/13 1730  . 0.9 % NaCl with KCl 20 mEq / L 45 mL/hr at 10/22/13 0636   Nutritional Goals:   RD recs (10/28):  2300-2500 Kcal/day and 110-125 g protein/day  Clinimix 5/20 at a goal rate of 100 ml/hr + 20% fat emulsion at 10 ml/hr to provide: 120 g/day protein, 2592 Kcal/day.  Current nutrition:   Diet: 10/30 Tolerating Full Liquid Diet   IVF:  0.9% NaCl with 20 mEq KCl at 45 ml/hr  CBGs & Insulin requirements past 24 hours:   CBGs:  116-161; elevated after starting TPN, will monitor  Resistant SSI - 14 units SSI  Assessment:  57 yo M admitted 10/23 with abdominal pain, found to have diverticulitis of colon with perforation.  S/p diagnostic laparoscopy  on 10/24 with washout but perforation was not identified, 3 JP drains were placed.  Pt now with post op ileus, NGT to LIS, minimal BS, abdominal distention.  Pharmacy is consulted to start TPN for prolonged Ileus.  11/2: TPN D#7 - CLD started 10/30.  Patient is tolerating FLD and having BM's.  Surgery anticipates prolonged recovering and does not plan on advancing diet past full liquids for quite some time.     Renal/hepatic function: SCr stable. LFTs, Alk Phos and Bili wnl (10/28).  Electrolytes: Na slightly low but stable at 134, cannot adjust in TNA    Pre-Albumin: 19.4 (10/24)  TG:   404 (10/28), 344 (10/29), 293 (10/31)  Above goal of TG < 150 after first day of IV lipids.  Glucose: Within goal range of < 150 -  Once CBG of 162  TPN Access: PICC placed 10/27  Plan:  At 1800 today  Continue Clinimix E 5/20 at 80 ml/hr (goal 168ml/hr)  Hold off advancing TPN to goal of 126ml/hr as Patient is on Full liquid diet and tolerating 100% charted.    Consider Calorie count and f/u dietitian recommendations in regard to additional liquid supplements on FLD   F/u MIVF rate at 45 ml/hr - still needed? Defer to MD  Monitor CBG's,  Resistant Scale Q4h  Triglycerides improving, still above goal < 150 -  hold off lipids since diet started - f/u TG level Monday.   Acceptable to give lipids at least qweekly (with Trigs <500) to prevent FA deficiency  TNA to contain standard multivitamins and trace elements daily.  TNA lab panels on Mondays & Thursdays.  Shamara Soza, Loma Messing PharmD Pager #: [phone] 9:35 AM 10/22/2013

## 2013-10-22 NOTE — Progress Notes (Signed)
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 Sided sz activity) given ativan>> consulted neurology and pt seen by Dr Amada Jupiter - after discussing Neuro's recs with Dr Maisie Fus given concerns about healing from perf>> she states not ideal but if needed as per neuro then to increase steroids back to 4mg , I have also given an extra dose as per neuro recs this pm - as previuosly noted the patient develops significant vasogenic edema with attempts to wean his dose  - Keppra dose increase to 500BID.  -  Dr Malachi Bonds discussed pt with Onc and stated -No need for further PCP prophylaxis per Dr. Welton Flakes, Patient may live a long time per Dr. Welton Flakes -  See Dr Joan Mayans 10/28 note, talked with Dr Truett Perna 10/29 and he stated Dr Myna Hidalgo will likely be the one to see/follow up with pt.    Diverticulitis of colon with perforation.   -  Continue empiric ertapenem.  Plan was not to narrow antibiotics based on culture and to continue to treat broadly for bowel perforation until we know that the perforation has sealed. -  POD 5 s/p diagnostic laparoscopy with washout.  Perforation was not identified.  -  Body fluid culture:  E. coli -  NG out 10/29, and surgery to follow and patient started on clears today(10/29)-  Continue IV nutrition -  Surgery following and to determine if/when repeat CT needed to reevaluate air, although it is noted that patient has stated that he does not want any further surgeries -Patient tolerating full liquids now, follow -appreciate surgery assistance Chronic steroid use.   Continue dexamethasone 2mg  IV BID  Hyperglycemia, likely stress response, and steroids.  Continue q6h insulin while on TNA and NPO  Thrush, stable.  Continue Diflucan.  CAD/HTN - diltiazem gtt DC'd today 10/30 at 10am after  completing the planned 3days while awaiting clonidine patch, mildly tachycardia overall rate and blood pressure remains controlled at this time, will follow  -  Continue metoprolol 10 mg q6h -  Continue clonidine patch started 10/27 -  Patient not on statin or ASA - follow and change to PO meds soon if tolerated advance diet  Hyponatremia and hypochloremia, were likely due to dehydration and fluids shifts. -  Resolved with hydration, follow  Mild leukocytosis -CT abd/pelvis last night looked stable and he is not having any further symptoms. - likely due to steroids -repeat UA was neg, monitoring #1 and surgery also following  -wbc still trending up today, and will likely continue to go up with increased steroids today  Mild anemia  likely secondary to acute illness. Hgb stable.   Diet:  continue TPN Access:  PIV Proph:  SCDs  Code Status: full Family Communication: sister at bedside Disposition Plan:  Pending clinical course  Consultants:  General Surgery, Dr. Derrell Lolling  Procedures:  Diagnostic Laparoscopy  Antibiotics:  Ertapenem 10/23 >>   HPI/Subjective:  Pt with seizure>> right sided jerking for a few mins and stopped and pt aler oriented x3 but difficulty with word finding  Objective: Filed Vitals:   10/21/13 1458 10/21/13 1739 10/21/13 2120 10/22/13 0420  BP: 124/77 134/92 135/85 138/99  Pulse: 111 113 102 103  Temp: 97.7 F (36.5 C)  98.3 F (36.8 C) 98.2 F (36.8 C)  TempSrc:   Oral Oral  Resp: 18  18 18   Height:  Weight:      SpO2: 98%  99% 99%    Intake/Output Summary (Last 24 hours) at 10/22/13 1139 Last data filed at 10/22/13 1056  Gross per 24 hour  Intake 2455.75 ml  Output 1377.5 ml  Net 1078.25 ml   Filed Weights   10/15/13 0400 10/16/13 0400 10/17/13 0500  Weight: 89.9 kg (198 lb 3.1 oz) 89.5 kg (197 lb 5 oz) 88.6 kg (195 lb 5.2 oz)    Exam:   General:  S/p seizure with word finding difficulty but oriented x3 No acute  distress  HEENT:  NCAT, MMM  Cardiovascular:  RRR, nl S1, S2 no mrg, 2+ pulses, warm extremities  Respiratory:  CTAB, no increased WOB  Abdomen:   + BS, soft, moderately distended, NT in the inferior quadrants without rebound or guarding.  Drains in place, purulence from upper left tube  MSK:   Normal tone and bulk, no LEE  Neuro:  Grossly intact  Data Reviewed: Basic Metabolic Panel:  Recent Labs Lab 10/16/13 0404 10/17/13 0458 10/18/13 0610 10/19/13 0455 10/20/13 0340 10/21/13 0440 10/22/13 0530  NA 134* 133* 135 135 136 133* 134*  K 4.3 3.9 3.9 3.9 3.9 4.2 3.7  CL 97 94* 100 99 100 99 98  CO2 23 26 25 24 25 25 26   GLUCOSE 105* 195* 142* 158* 166* 153* 164*  BUN 17 15 16 16 17 16 15   CREATININE 0.63 0.63 0.62 0.58 0.60 0.60 0.55  CALCIUM 8.7 9.4 9.0 9.0 9.1 9.4 9.0  MG  --  2.0  --  1.9  --   --   --   PHOS  --  3.1  --  3.1  --   --   --    Liver Function Tests:  Recent Labs Lab 10/17/13 0458 10/19/13 0455  AST 17 24  ALT 34 41  ALKPHOS 83 81  BILITOT 0.2* 0.3  PROT 6.6 6.2  ALBUMIN 2.5* 2.4*   No results found for this basename: LIPASE, AMYLASE,  in the last 168 hours No results found for this basename: AMMONIA,  in the last 168 hours CBC:  Recent Labs Lab 10/17/13 0458 10/19/13 0455 10/20/13 0512 10/22/13 0530  WBC 11.9* 13.2* 13.3* 14.7*  NEUTROABS 10.6*  --   --   --   HGB 12.9* 12.0* 11.7* 11.4*  HCT 37.6* 34.7* 34.8* 34.2*  MCV 96.4 97.2 96.9 97.7  PLT 259 233 256 261   Cardiac Enzymes: No results found for this basename: CKTOTAL, CKMB, CKMBINDEX, TROPONINI,  in the last 168 hours BNP (last 3 results) No results found for this basename: PROBNP,  in the last 8760 hours CBG:  Recent Labs Lab 10/21/13 1153 10/21/13 1630 10/21/13 2005 10/21/13 2353 10/22/13 0725  GLUCAP 103* 162* 124* 137* 116*    Recent Results (from the past 240 hour(s))  SURGICAL PCR SCREEN     Status: None   Collection Time    10/13/13 12:37 PM       Result Value Range Status   MRSA, PCR NEGATIVE  NEGATIVE Final   Staphylococcus aureus NEGATIVE  NEGATIVE Final   Comment:            The Xpert SA Assay (FDA     approved for NASAL specimens     in patients over 61 years of age),     is one component of     a comprehensive surveillance     program.  Test performance has  been validated by [facility] for patients greater     than or equal to 48 year old.     It is not intended     to diagnose infection nor to     guide or monitor treatment.  ANAEROBIC CULTURE     Status: None   Collection Time    10/13/13  2:51 PM      Result Value Range Status   Specimen Description FLUID ADBOMINAL   Final   Special Requests NONE   Final   Gram Stain     Final   Value: ABUNDANT WBC PRESENT,BOTH PMN AND MONONUCLEAR     NO SQUAMOUS EPITHELIAL CELLS SEEN     NO ORGANISMS SEEN     Performed at [facility]   Culture     Final   Value: NO ANAEROBES ISOLATED     Performed at [facility]   Report Status 10/18/2013 FINAL   Final  BODY FLUID CULTURE     Status: None   Collection Time    10/13/13  2:51 PM      Result Value Range Status   Specimen Description FLUID ADBOMINAL   Final   Special Requests NONE   Final   Gram Stain     Final   Value: MODERATE WBC PRESENT, PREDOMINANTLY PMN     NO ORGANISMS SEEN     Performed at [facility]   Culture     Final   Value: MODERATE ESCHERICHIA COLI     Performed at [facility]   Report Status 10/16/2013 FINAL   Final   Organism ID, Bacteria ESCHERICHIA COLI   Final  URINE CULTURE     Status: None   Collection Time    10/20/13  6:30 AM      Result Value Range Status   Specimen Description URINE, CLEAN CATCH   Final   Special Requests NONE   Final   Culture  Setup Time     Final   Value: 10/20/2013 09:23     Performed at [facility] Count     Final   Value: NO GROWTH     Performed at [facility]   Culture     Final   Value: NO  GROWTH     Performed at [facility]   Report Status 10/21/2013 FINAL   Final     Studies: No results found.  Scheduled Meds: . antiseptic oral rinse  15 mL Mouth Rinse q12n4p  . calcium carbonate  1 tablet Oral Daily  . chlorhexidine  15 mL Mouth Rinse BID  . cloNIDine  0.1 mg Transdermal Weekly  . dexamethasone  2 mg Intravenous Q12H  . enoxaparin (LOVENOX) injection  40 mg Subcutaneous Q24H  . ertapenem (INVANZ) IV  1 g Intravenous Q24H  . fentaNYL  25 mcg Transdermal Q72H  . fluconazole (DIFLUCAN) IV  200 mg Intravenous Q24H  . insulin aspart  0-20 Units Subcutaneous Q4H  . levETIRAcetam  500 mg Intravenous Q24H  . metoprolol  10 mg Intravenous Q6H  . pantoprazole (PROTONIX) IV  40 mg Intravenous Q12H  . sodium chloride  10-40 mL Intracatheter Q12H   Continuous Infusions: . Marland KitchenTPN (CLINIMIX-E) Adult 80 mL/hr at 10/21/13 1730  . Marland KitchenTPN (CLINIMIX-E) Adult    . 0.9 % NaCl with KCl 20 mEq / L 45 mL/hr at 10/22/13 0636    Principal Problem:   Diverticulitis of  colon with perforation Active Problems:   Right sided weakness   Hyperglycemia   Thrush    Time spent: 35 min    Deyjah Kindel C  [facility] Pager [phone]. If 7PM-7AM, please contact night-coverage at [email or web address], password [facility] 10/22/2013, 11:39 AM  LOS: 10 days

## 2013-10-22 NOTE — Progress Notes (Signed)
Patient ID: Martin Martin, male   DOB: 26-Apr-1956, 57 y.o.   MRN: [id number] 9 Days Post-Op  Subjective: Pt feels well today.  No complaints.  Tolerating full liquids and having BM's  Objective: Vital signs in last 24 hours: Temp:  [97.7 F (36.5 C)-98.3 F (36.8 C)] 98.2 F (36.8 C) (11/02 0420) Pulse Rate:  [102-113] 103 (11/02 0420) Resp:  [18] 18 (11/02 0420) BP: (124-138)/(77-99) 138/99 mmHg (11/02 0420) SpO2:  [98 %-99 %] 99 % (11/02 0420) Last BM Date: 10/19/13  Intake/Output from previous day: 11/01 0701 - 11/02 0700 In: 2815.8 [P.O.:360; I.V.:1135.8; IV Piggyback:360; TPN:960] Out: 1835 [Urine:1775; Drains:60] Intake/Output this shift:    PE: Abd: soft, NT, incisions are c/d/i, One JP still not holding charge for a significant period of time. JP 2 and 3 draining clear fluid.  JP 1 still has purulent material  Lab Results:   Recent Labs  10/20/13 0512 10/22/13 0530  WBC 13.3* 14.7*  HGB 11.7* 11.4*  HCT 34.8* 34.2*  PLT 256 261   BMET  Recent Labs  10/21/13 0440 10/22/13 0530  NA 133* 134*  K 4.2 3.7  CL 99 98  CO2 25 26  GLUCOSE 153* 164*  BUN 16 15  CREATININE 0.60 0.55  CALCIUM 9.4 9.0   PT/INR No results found for this basename: LABPROT, INR,  in the last 72 hours CMP     Component Value Date/Time   NA 134* 10/22/2013 0530   NA 141 10/09/2013 1300   K 3.7 10/22/2013 0530   K 4.4 10/09/2013 1300   CL 98 10/22/2013 0530   CL 102 06/05/2013 1406   CO2 26 10/22/2013 0530   CO2 21* 10/09/2013 1300   GLUCOSE 164* 10/22/2013 0530   GLUCOSE 181* 10/09/2013 1300   GLUCOSE 165* 06/05/2013 1406   BUN 15 10/22/2013 0530   BUN 19.6 10/09/2013 1300   CREATININE 0.55 10/22/2013 0530   CREATININE 0.7 10/09/2013 1300   CALCIUM 9.0 10/22/2013 0530   CALCIUM 9.3 10/09/2013 1300   PROT 6.2 10/19/2013 0455   PROT 6.9 10/09/2013 1300   ALBUMIN 2.4* 10/19/2013 0455   ALBUMIN 2.6* 10/09/2013 1300   AST 24 10/19/2013 0455   AST 30 10/09/2013 1300   ALT 41  10/19/2013 0455   ALT 124* 10/09/2013 1300   ALKPHOS 81 10/19/2013 0455   ALKPHOS 89 10/09/2013 1300   BILITOT 0.3 10/19/2013 0455   BILITOT 0.35 10/09/2013 1300   GFRNONAA >90 10/22/2013 0530   GFRAA >90 10/22/2013 0530   Lipase     Component Value Date/Time   LIPASE 39 10/12/2013 1030       Studies/Results: No results found.  Anti-infectives: Anti-infectives   Start     Dose/Rate Route Frequency Ordered Stop   10/13/13 1800  fluconazole (DIFLUCAN) IVPB 200 mg     200 mg 100 mL/hr over 60 Minutes Intravenous Every 24 hours 10/13/13 1707 10/23/13 1759   10/13/13 1715  ertapenem (INVANZ) 1 g in sodium chloride 0.9 % 50 mL IVPB  Status:  Discontinued     1 g 100 mL/hr over 30 Minutes Intravenous Every 24 hours 10/13/13 1707 10/13/13 1750   10/12/13 1600  fluconazole (DIFLUCAN) tablet 100 mg  Status:  Discontinued     100 mg Oral Daily 10/12/13 1428 10/13/13 1707   10/12/13 1600  sulfamethoxazole-trimethoprim (BACTRIM DS) 800-160 MG per tablet 1 tablet  Status:  Discontinued     1 tablet Oral Daily 10/12/13 1428 10/13/13 1707  10/12/13 1600  ertapenem (INVANZ) 1 g in sodium chloride 0.9 % 50 mL IVPB     1 g 100 mL/hr over 30 Minutes Intravenous Every 24 hours 10/12/13 1507         Assessment/Plan  1. Perforated viscus, site unknown, most likely diverticular perf as drain in L paracolic gutter is most purulent  2. Glioblastoma on steroids and Avastin  Plan: 1. Patient doing well with drains intact.  Tolerating full liquids.  Cont antibiotics for intra-abd infection.  WBC up slightly today.  Will follow for now.  Anticipate prolonged recovery.  Do not plan on advancing diet past full liquids for quite some time.      LOS: 10 days    Jamahl Lemmons C. 10/22/2013, 8:46 AM

## 2013-10-23 ENCOUNTER — Inpatient Hospital Stay (HOSPITAL_COMMUNITY): Payer: BC Managed Care – PPO

## 2013-10-23 ENCOUNTER — Encounter: Payer: Self-pay | Admitting: Family

## 2013-10-23 ENCOUNTER — Other Ambulatory Visit: Payer: BC Managed Care – PPO

## 2013-10-23 ENCOUNTER — Encounter (HOSPITAL_COMMUNITY): Payer: Self-pay | Admitting: Radiology

## 2013-10-23 ENCOUNTER — Ambulatory Visit: Payer: BC Managed Care – PPO | Admitting: Family

## 2013-10-23 ENCOUNTER — Ambulatory Visit: Payer: BC Managed Care – PPO

## 2013-10-23 LAB — COMPREHENSIVE METABOLIC PANEL
AST: 30 U/L (ref 0–37)
Albumin: 2.3 g/dL — ABNORMAL LOW (ref 3.5–5.2)
Alkaline Phosphatase: 122 U/L — ABNORMAL HIGH (ref 39–117)
BUN: 17 mg/dL (ref 6–23)
CO2: 26 mEq/L (ref 19–32)
Calcium: 8.7 mg/dL (ref 8.4–10.5)
Creatinine, Ser: 0.53 mg/dL (ref 0.50–1.35)
GFR calc Af Amer: >90 mL/min (ref >90)
GFR calc non Af Amer: >90 mL/min (ref >90)

## 2013-10-23 LAB — GLUCOSE, CAPILLARY
Glucose-Capillary: 132 mg/dL — ABNORMAL HIGH (ref 70–99)
Glucose-Capillary: 137 mg/dL — ABNORMAL HIGH (ref 70–99)
Glucose-Capillary: 158 mg/dL — ABNORMAL HIGH (ref 70–99)
Glucose-Capillary: 165 mg/dL — ABNORMAL HIGH (ref 70–99)
Glucose-Capillary: 173 mg/dL — ABNORMAL HIGH (ref 70–99)

## 2013-10-23 LAB — MAGNESIUM: Magnesium: 1.8 mg/dL (ref 1.5–2.5)

## 2013-10-23 LAB — PREALBUMIN: Prealbumin: 30.8 mg/dL (ref 17.0–34.0)

## 2013-10-23 LAB — DIFFERENTIAL
Basophils Absolute: 0 10*3/uL (ref 0.0–0.1)
Lymphocytes Relative: 5 % — ABNORMAL LOW (ref 12–46)
Lymphs Abs: 0.7 10*3/uL (ref 0.7–4.0)
Monocytes Relative: 7 % (ref 3–12)
Neutro Abs: 12.5 10*3/uL — ABNORMAL HIGH (ref 1.7–7.7)
Neutrophils Relative %: 88 % — ABNORMAL HIGH (ref 43–77)

## 2013-10-23 LAB — PHOSPHORUS: Phosphorus: 3.6 mg/dL (ref 2.3–4.6)

## 2013-10-23 LAB — CBC
HCT: 33.2 % — ABNORMAL LOW (ref 39.0–52.0)
Platelets: 257 10*3/uL (ref 150–400)
RDW: 16.7 % — ABNORMAL HIGH (ref 11.5–15.5)
WBC: 14.2 10*3/uL — ABNORMAL HIGH (ref 4.0–10.5)

## 2013-10-23 LAB — TRIGLYCERIDES: Triglycerides: 361 mg/dL — ABNORMAL HIGH (ref <150)

## 2013-10-23 MED ORDER — MAGIC MOUTHWASH
15.0000 mL | Freq: Four times a day (QID) | ORAL | Status: DC | PRN
  Filled 2013-10-23: qty 15

## 2013-10-23 MED ORDER — PANTOPRAZOLE SODIUM 40 MG PO TBEC
40.0000 mg | DELAYED_RELEASE_TABLET | Freq: Two times a day (BID) | ORAL | Status: DC
  Administered 2013-10-23 – 2013-11-02 (×19): 40 mg via ORAL
  Filled 2013-10-23 (×20): qty 1

## 2013-10-23 MED ORDER — SODIUM CHLORIDE 0.9 % IV SOLN
500.0000 mg | INTRAVENOUS | Status: AC
  Administered 2013-10-23: 500 mg via INTRAVENOUS
  Filled 2013-10-23: qty 5

## 2013-10-23 MED ORDER — FAT EMULSION 20 % IV EMUL
240.0000 mL | INTRAVENOUS | Status: AC
  Administered 2013-10-23: 17:00:00 240 mL via INTRAVENOUS
  Filled 2013-10-23: qty 250

## 2013-10-23 MED ORDER — ALUM & MAG HYDROXIDE-SIMETH 200-200-20 MG/5ML PO SUSP
30.0000 mL | Freq: Four times a day (QID) | ORAL | Status: DC | PRN
  Filled 2013-10-23: qty 30

## 2013-10-23 MED ORDER — CLINIMIX E/DEXTROSE (5/20) 5 % IV SOLN
INTRAVENOUS | Status: AC
  Administered 2013-10-23: 17:00:00 via INTRAVENOUS
  Filled 2013-10-23: qty 2000

## 2013-10-23 MED ORDER — LORAZEPAM 2 MG/ML IJ SOLN
2.0000 mg | Freq: Four times a day (QID) | INTRAMUSCULAR | Status: DC | PRN
  Administered 2013-10-23: 2 mg via INTRAVENOUS
  Filled 2013-10-23: qty 1

## 2013-10-23 MED ORDER — LIP MEDEX EX OINT
1.0000 "application " | TOPICAL_OINTMENT | Freq: Two times a day (BID) | CUTANEOUS | Status: DC
  Administered 2013-10-23 – 2013-10-26 (×7): 1 via TOPICAL
  Filled 2013-10-23 (×2): qty 7

## 2013-10-23 MED ORDER — LORAZEPAM 2 MG/ML IJ SOLN
2.0000 mg | INTRAMUSCULAR | Status: DC | PRN
  Administered 2013-10-24 – 2013-10-27 (×6): 2 mg via INTRAVENOUS
  Filled 2013-10-23 (×7): qty 1

## 2013-10-23 MED ORDER — SODIUM CHLORIDE 0.9 % IV SOLN
1000.0000 mg | Freq: Two times a day (BID) | INTRAVENOUS | Status: DC
  Administered 2013-10-23 – 2013-10-24 (×2): 1000 mg via INTRAVENOUS
  Filled 2013-10-23 (×2): qty 10

## 2013-10-23 MED ORDER — IOHEXOL 300 MG/ML  SOLN
100.0000 mL | Freq: Once | INTRAMUSCULAR | Status: AC | PRN
  Administered 2013-10-23: 100 mL via INTRAVENOUS

## 2013-10-23 MED ORDER — LORAZEPAM 2 MG/ML IJ SOLN
INTRAMUSCULAR | Status: AC
  Administered 2013-10-23: 04:00:00
  Filled 2013-10-23: qty 1

## 2013-10-23 MED ORDER — SACCHAROMYCES BOULARDII 250 MG PO CAPS
250.0000 mg | ORAL_CAPSULE | Freq: Two times a day (BID) | ORAL | Status: DC
  Administered 2013-10-23 – 2013-11-02 (×20): 250 mg via ORAL
  Filled 2013-10-23 (×23): qty 1

## 2013-10-23 MED ORDER — IOHEXOL 300 MG/ML  SOLN
50.0000 mL | Freq: Once | INTRAMUSCULAR | Status: AC | PRN
  Administered 2013-10-23: 50 mL via ORAL

## 2013-10-23 MED ORDER — PANTOPRAZOLE SODIUM 40 MG PO TBEC: 40.0000 mg | DELAYED_RELEASE_TABLET | Freq: Every day | ORAL | Status: DC

## 2013-10-23 MED ORDER — ADULT MULTIVITAMIN W/MINERALS CH
1.0000 | ORAL_TABLET | Freq: Every day | ORAL | Status: DC
  Administered 2013-10-23 – 2013-11-02 (×11): 1 via ORAL
  Filled 2013-10-23 (×12): qty 1

## 2013-10-23 NOTE — Progress Notes (Signed)
Staff heard pt's breathing pattern change, arrived in room pt having a seizure. Ativan given,  Pt became postictal and was incontinent of urine.  NP notified and came to bedside, pt VS returning to his normal.  Will continue to monitor closely.  Barnett Hatter P

## 2013-10-23 NOTE — Progress Notes (Signed)
PARENTERAL NUTRITION CONSULT NOTE - Follow up  Pharmacy Consult for TPN Indication: Prolonged Ileus  Allergies  Allergen Reactions  . Dilaudid [Hydromorphone Hcl] Nausea And Vomiting  . Morphine And Related Nausea And Vomiting    Patient Measurements: Height: 6' (182.9 cm) Weight: 195 lb 5.2 oz (88.6 kg) IBW/kg (Calculated) : 77.6  Labs:  Recent Labs  10/22/13 0530 10/23/13 0525  WBC 14.7* 14.2*  HGB 11.4* 11.1*  HCT 34.2* 33.2*  PLT 261 257     Recent Labs  10/21/13 0440 10/22/13 0530 10/23/13 0525  NA 133* 134* 136  K 4.2 3.7 3.8  CL 99 98 99  CO2 25 26 26   GLUCOSE 153* 164* 141*  BUN 16 15 17   CREATININE 0.60 0.55 0.53  CALCIUM 9.4 9.0 8.7  MG  --   --  1.8  PHOS  --   --  3.6  PROT  --   --  5.5*  ALBUMIN  --   --  2.3*  AST  --   --  30  ALT  --   --  66*  ALKPHOS  --   --  122*  BILITOT  --   --  0.2*  TRIG  --   --  361*   Estimated Creatinine Clearance: 111.8 ml/min (by C-G formula based on Cr of 0.53).    Recent Labs  10/23/13 0002 10/23/13 0410 10/23/13 0815  GLUCAP 158* 140* 109*   CBGs & Insulin requirements past 24 hours:  - CBGs 109-158, required 14 units SSI  Nutritional Goals:  - RD recs 10/28: 2300-2500 Kcal, 110-125 g protein, 2.3-2.5 L/day - Clinimix 5/20 at a goal rate of 100 ml/hr + 20% fat emulsion at 10 ml/hr to provide: 120 g/day protein, 2592 Kcal/day.  Current nutrition:  - Diet: Full liquid diet starting 11/1  - TNA: Clinimix E 5/20 @ 80 ml/hr, IV fat emulsion prn as TGs are elevated - mIVF: 0.9% NaCl with 20 mEq KCl at 45 ml/hr   Assessment:  57 yo M admitted 10/23 with abdominal pain, found to have diverticulitis of colon with perforation.  S/p diagnostic laparoscopy on 10/24 with washout but perforation was not identified, 3 JP drains were placed.  Pt now with post op ileus, NGT to LIS, minimal BS, abdominal distention.  Pharmacy is consulted to start TPN for prolonged Ileus 10/27  11/3, today is TNA day#8. Pt  is tolerating full liquid diet well. Per surgery, do not plan on advancing diet past full liquids for quite some time.    Labs: Electrolytes: lytes wnl Renal Function: Scr stable/wnl. UOP 1.2 ml/kg/hr Hepatic Function: alk phos trending up (122) Pre-Albumin: 18.7 (10/28), 19.4 (10/24) Triglycerides: 404 (10/28), 344 (10/29), 293 (10/31), elevated to 361 (11/3). Above goal of TG < 150 after first day of IV lipids. CBGs: mostly controlled on SSI  Plan:    Continue Clinimix E 5/20 at 80 ml/hr (will not advance to goal given on full liquid diet and tolerating well). Discussed with RD today, can begin calorie count and adjust TNA per new requirements tomorrow.   Triglycerides further elevated (goal < 150)   Acceptable to give lipids at least qweekly (with Trigs <500) to prevent FA deficiency so will give today since last lipid administration was 10/27  Standard multivitamins and trace elements via po multivitamin tablet  Continue CBGs and SSI q4h  TNA labs Monday/Thursdays  Pharmacy will follow up daily  Geoffry Paradise, PharmD, BCPS Pager: [phone] 10:59 AM Pharmacy #:  [phone]  

## 2013-10-23 NOTE — Progress Notes (Signed)
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 Sided sz activity) given ativan>> consulted neurology and pt seen by Dr Amada Jupiter - after discussing Neuro's recs with Dr Maisie Fus given concerns about healing from perf>> she states not ideal but if needed as per neuro then to increase steroids back to 4mg , I have also given an extra dose as per neuro recs this pm - as previuosly noted the patient develops significant vasogenic edema with attempts to wean his dose  - Keppra dose again increased today per neuro .  -  Dr Malachi Bonds discussed pt with Onc and stated -No need for further PCP prophylaxis per Dr. Welton Flakes, Patient may live a long time per Dr. Welton Flakes -  See Dr Joan Mayans 10/28 note, talked with Dr Truett Perna 10/29 and he stated Dr Myna Hidalgo will see in am 11/4   Diverticulitis of colon with perforation.   -  Continue empiric ertapenem.  Plan was not to narrow antibiotics based on culture and to continue to treat broadly for bowel perforation until we know that the perforation has sealed. -  POD 5 s/p diagnostic laparoscopy with washout.  Perforation was not identified.  -  Body fluid culture:  E. coli -  NG out 10/29, and surgery to follow and patient started on clears today(10/29)-  Continue IV nutrition -  Surgery following and to determine if/when repeat CT needed to reevaluate air, although it is noted that patient has stated that he does not want any further surgeries -Patient tolerating full liquids now, follow -appreciate surgery assistance Chronic steroid use.   Continue dexamethasone 2mg  IV BID  Hyperglycemia, likely stress response, and steroids.  Continue q6h insulin while on TNA and NPO  Thrush, stable.  Continue Diflucan.  CAD/HTN - diltiazem gtt DC'd today 10/30 at 10am after completing the  planned 3days while awaiting clonidine patch, mildly tachycardia overall rate and blood pressure remains controlled at this time, will follow  -  Continue metoprolol 10 mg q6h -  Continue clonidine patch started 10/27 -  Patient not on statin or ASA - follow and change to PO meds soon if tolerated advance diet  Hyponatremia and hypochloremia, were likely due to dehydration and fluids shifts. -  Resolved with hydration, follow  Mild leukocytosis -CT abd/pelvis last night looked stable and he is not having any further symptoms. - likely due to steroids -repeat UA was neg, monitoring #1 and surgery also following  -wbc still trending up today, and will likely continue to go up with increased steroids today  Mild anemia  likely secondary to acute illness. Hgb stable.   Diet:  continue TPN Access:  PIV Proph:  SCDs  Code Status: full Family Communication: sister at bedside Disposition Plan:  Pending clinical course  Consultants:  General Surgery, Dr. Derrell Lolling  Procedures:  Diagnostic Laparoscopy  Antibiotics:  Ertapenem 10/23 >>   HPI/Subjective:  Pt with seizure x 3 this am per nsg>>stopped with ATivan Objective: Filed Vitals:   10/22/13 1348 10/22/13 1720 10/22/13 2033 10/23/13 0414  BP: 139/90 128/99 125/99 131/108  Pulse: 122 111 100 109  Temp:   97.7 F (36.5 C) 97.9 F (36.6 C)  TempSrc:   Oral Oral  Resp:   24 16  Height:      Weight:      SpO2:   97% 96%  Intake/Output Summary (Last 24 hours) at 10/23/13 1040 Last data filed at 10/23/13 0740  Gross per 24 hour  Intake   3300 ml  Output 2885.5 ml  Net  414.5 ml   Filed Weights   10/15/13 0400 10/16/13 0400 10/17/13 0500  Weight: 89.9 kg (198 lb 3.1 oz) 89.5 kg (197 lb 5 oz) 88.6 kg (195 lb 5.2 oz)    Exam:   General:  S/p seizure with word finding difficulty but oriented x3 No acute distress  HEENT:  NCAT, MMM  Cardiovascular:  RRR, nl S1, S2 no mrg, 2+ pulses, warm  extremities  Respiratory:  CTAB, no increased WOB  Abdomen:   + BS, soft, moderately distended, NT in the inferior quadrants without rebound or guarding.  Drains in place, purulence from upper left tube  MSK:   Normal tone and bulk, no LEE  Neuro:  Grossly intact  Data Reviewed: Basic Metabolic Panel:  Recent Labs Lab 10/17/13 0458  10/19/13 0455 10/20/13 0340 10/21/13 0440 10/22/13 0530 10/23/13 0525  NA 133*  < > 135 136 133* 134* 136  K 3.9  < > 3.9 3.9 4.2 3.7 3.8  CL 94*  < > 99 100 99 98 99  CO2 26  < > 24 25 25 26 26   GLUCOSE 195*  < > 158* 166* 153* 164* 141*  BUN 15  < > 16 17 16 15 17   CREATININE 0.63  < > 0.58 0.60 0.60 0.55 0.53  CALCIUM 9.4  < > 9.0 9.1 9.4 9.0 8.7  MG 2.0  --  1.9  --   --   --  1.8  PHOS 3.1  --  3.1  --   --   --  3.6  < > = values in this interval not displayed. Liver Function Tests:  Recent Labs Lab 10/17/13 0458 10/19/13 0455 10/23/13 0525  AST 17 24 30   ALT 34 41 66*  ALKPHOS 83 81 122*  BILITOT 0.2* 0.3 0.2*  PROT 6.6 6.2 5.5*  ALBUMIN 2.5* 2.4* 2.3*   No results found for this basename: LIPASE, AMYLASE,  in the last 168 hours No results found for this basename: AMMONIA,  in the last 168 hours CBC:  Recent Labs Lab 10/17/13 0458 10/19/13 0455 10/20/13 0512 10/22/13 0530 10/23/13 0525  WBC 11.9* 13.2* 13.3* 14.7* 14.2*  NEUTROABS 10.6*  --   --   --  12.5*  HGB 12.9* 12.0* 11.7* 11.4* 11.1*  HCT 37.6* 34.7* 34.8* 34.2* 33.2*  MCV 96.4 97.2 96.9 97.7 97.9  PLT 259 233 256 261 257   Cardiac Enzymes: No results found for this basename: CKTOTAL, CKMB, CKMBINDEX, TROPONINI,  in the last 168 hours BNP (last 3 results) No results found for this basename: PROBNP,  in the last 8760 hours CBG:  Recent Labs Lab 10/22/13 1650 10/22/13 2027 10/23/13 0002 10/23/13 0410 10/23/13 0815  GLUCAP 158* 129* 158* 140* 109*    Recent Results (from the past 240 hour(s))  SURGICAL PCR SCREEN     Status: None   Collection  Time    10/13/13 12:37 PM      Result Value Range Status   MRSA, PCR NEGATIVE  NEGATIVE Final   Staphylococcus aureus NEGATIVE  NEGATIVE Final   Comment:            The Xpert SA Assay (FDA     approved for NASAL specimens     in patients over 15 years of age),  is one component of     a comprehensive surveillance     program.  Test performance has     been validated by [facility] for patients greater     than or equal to 45 year old.     It is not intended     to diagnose infection nor to     guide or monitor treatment.  ANAEROBIC CULTURE     Status: None   Collection Time    10/13/13  2:51 PM      Result Value Range Status   Specimen Description FLUID ADBOMINAL   Final   Special Requests NONE   Final   Gram Stain     Final   Value: ABUNDANT WBC PRESENT,BOTH PMN AND MONONUCLEAR     NO SQUAMOUS EPITHELIAL CELLS SEEN     NO ORGANISMS SEEN     Performed at [facility]   Culture     Final   Value: NO ANAEROBES ISOLATED     Performed at [facility]   Report Status 10/18/2013 FINAL   Final  BODY FLUID CULTURE     Status: None   Collection Time    10/13/13  2:51 PM      Result Value Range Status   Specimen Description FLUID ADBOMINAL   Final   Special Requests NONE   Final   Gram Stain     Final   Value: MODERATE WBC PRESENT, PREDOMINANTLY PMN     NO ORGANISMS SEEN     Performed at [facility]   Culture     Final   Value: MODERATE ESCHERICHIA COLI     Performed at [facility]   Report Status 10/16/2013 FINAL   Final   Organism ID, Bacteria ESCHERICHIA COLI   Final  URINE CULTURE     Status: None   Collection Time    10/20/13  6:30 AM      Result Value Range Status   Specimen Description URINE, CLEAN CATCH   Final   Special Requests NONE   Final   Culture  Setup Time     Final   Value: 10/20/2013 09:23     Performed at [facility] Count     Final   Value: NO GROWTH     Performed at [facility]    Culture     Final   Value: NO GROWTH     Performed at [facility]   Report Status 10/21/2013 FINAL   Final     Studies: Ct Head Wo Contrast  10/22/2013   CLINICAL DATA:  Evaluate for recent seizures. Glioblastoma status post surgery and radiotherapy.  EXAM: CT HEAD WITHOUT CONTRAST  TECHNIQUE: Contiguous axial images were obtained from the base of the skull through the vertex without contrast.  COMPARISON:  Metabolic brain scan 09/26/2013. MRI brain 09/08/13.  FINDINGS: Left parietal hypodensity at the surgical site with moderate surrounding vasogenic edema. No significant midline shift for impending herniation. As seen on image 23 cross-sectional measurements of 26 x 28 mm. There is slightly less mass effect when compared with prior MRI with regard to regional vasogenic edema. There is no superimposed hemorrhage, midline shift, or new intracranial abnormalities. Unremarkable appearing craniotomy defect.  IMPRESSION: Left parietal lesion appears stable to slightly improved with regard to mass effect and surrounding edema. No new abnormalities are detected on this noncontrast exam. The lesion remains indeterminate for radiation  necrosis versus glioblastoma recurrence.   Electronically Signed   By: Davonna Belling M.D.   On: 10/22/2013 14:41    Scheduled Meds: . antiseptic oral rinse  15 mL Mouth Rinse q12n4p  . calcium carbonate  1 tablet Oral Daily  . chlorhexidine  15 mL Mouth Rinse BID  . cloNIDine  0.1 mg Transdermal Weekly  . dexamethasone  4 mg Intravenous Q12H  . enoxaparin (LOVENOX) injection  40 mg Subcutaneous Q24H  . ertapenem (INVANZ) IV  1 g Intravenous Q24H  . fentaNYL  25 mcg Transdermal Q72H  . insulin aspart  0-20 Units Subcutaneous Q4H  . levETIRAcetam  500 mg Intravenous Q12H  . lip balm  1 application Topical BID  . metoprolol  10 mg Intravenous Q6H  . pantoprazole  40 mg Oral BID  . saccharomyces boulardii  250 mg Oral BID  . sodium chloride  10-40 mL Intracatheter  Q12H   Continuous Infusions: . Marland KitchenTPN (CLINIMIX-E) Adult 80 mL/hr at 10/22/13 1717  . 0.9 % NaCl with KCl 20 mEq / L 45 mL/hr at 10/22/13 1438    Principal Problem:   Diverticulitis of colon with perforation Active Problems:   Right sided weakness   Hyperglycemia   Thrush    Time spent: 35 min    Taraoluwa Thakur C  [facility] Pager [phone]. If 7PM-7AM, please contact night-coverage at [email or web address], password [facility] 10/23/2013, 10:40 AM  LOS: 11 days

## 2013-10-23 NOTE — Progress Notes (Signed)
Patient/ family teaching regarding JP drains was not done, per sister she does not stay with the patient and was asking about   Home Health services if patient will be discharged.

## 2013-10-23 NOTE — Progress Notes (Signed)
Martin Martin [id number] 07-May-1956  CARE TEAM:  PCP: [name], MD  Outpatient Care Team: Patient Care Team: Ernestina Penna, MD as PCP - General (Family Medicine)  Inpatient Treatment Team: Treatment Team: Attending Provider: Kela Millin, MD; Consulting Physician: Bishop Limbo, MD; Consulting Physician: Maryruth Bun Rama, MD; Registered Nurse: Virgina Organ, RN; Rounding Team: Massie Kluver, MD; Registered Nurse: Joslyn Devon [facility], RN; Consulting Physician: Josph Macho, MD; Technician: Karren Burly, [place]; Technician: Cindie Crumbly, NT; Registered Nurse: James Ivanoff, RN; Registered Nurse: Tristan Schroeder, RN; Technician: Tommi Rumps, NT; Registered Nurse: Rachael Darby, RN; Consulting Physician: Kym Groom, MD   Subjective:  Seizures last night Sister at bedside Pt denies pain Wanting to get home  Objective:  Vital signs:  Filed Vitals:   10/22/13 1348 10/22/13 1720 10/22/13 2033 10/23/13 0414  BP: 139/90 128/99 125/99 131/108  Pulse: 122 111 100 109  Temp:   97.7 F (36.5 C) 97.9 F (36.6 C)  TempSrc:   Oral Oral  Resp:   24 16  Height:      Weight:      SpO2:   97% 96%    Last BM Date: 10/19/13  Intake/Output   Yesterday:  11/02 0701 - 11/03 0700 In: 3420 [P.O.:240; I.V.:1053; IV Piggyback:255; TPN:1872] Out: 2485.5 [Urine:2450; Drains:35.5] This shift:  Total I/O In: -  Out: 400 [Urine:400]  Bowel function:  Flatus: y  BM: y  Drain:   LUQ = interloop SB = tan purulent LLQ = LUQ/splenic fossa = serous Suprapubic = pelvis = minimal serous /gas  Physical Exam:  General: Pt awakens/oriented x4 in no acute distress Eyes: PERRL, normal EOM.  Sclera clear.  No icterus Neuro: CN II-XII intact w/o focal sensory/motor deficits. Lymph: No head/neck/groin lymphadenopathy Psych:  No delerium/psychosis/paranoia HENT: Normocephalic x Moon face, Mucus membranes moist.  No thrush Neck: Supple, No tracheal  deviation Chest: No chest wall pain w good excursion CV:  Pulses intact.  Regular rhythm MS: Normal AROM mjr joints.  No obvious deformity Abdomen: Soft.  Mod distended/obese.  Nontender.  No evidence of peritonitis.  No incarcerated hernias. Ext:  SCDs BLE.  No mjr edema.  No cyanosis Skin: No petechiae / purpura   Problem List:   Principal Problem:   Diverticulitis of colon with perforation Active Problems:   Right sided weakness   Hyperglycemia   Thrush   Assessment  Martin Martin  57 y.o. male  10 Days Post-Op  Procedure(s): DIAGNOSTIC LAPAROSCOPY and Placement of Drains  Ileus from perf bowel (presumed diverticular) resolving  Plan:  -CT scan eval peritoneal collections.  If improved/not worse, d/c the LLQ & suprapubic drains & leave only the drain containing tan/thick drainage exiting the skin at the LUQ.  If worse, place new drains as needed  -continued ABx - suspect needs 3 weeks.  OK to transition to outpatient at d/c.  ?daily imipenem/zosyn IV vs change to PO Augentin.  Consider ID consult for recs given chronic steroids/recent Avastin.    -adv diet & see if can wean off TNA if CT is improved/not worse.  If worse, back to liquids vs NPO if much worse (hopefully less likely)  -Dispo per family - they want him home ASAP  -seizure Tx with steroids/Keppra/Neuro care  -Tx for Glioblastoma with residual right sided weakness - MedOnc imply potential long life expectancy despite numerous issues.  -VTE prophylaxis- SCDs, etc  -mobilize as tolerated to help recovery  Ardeth Sportsman, M.D., F.A.C.S. Gastrointestinal and Minimally Invasive Surgery [facility] [address] [place], [place] [address] [phone] Main / Paging   10/23/2013   Results:   Labs: Results for orders placed during the hospital encounter of 10/12/13 (from the past 48 hour(s))  GLUCOSE, CAPILLARY     Status: Abnormal   Collection Time    10/21/13  11:53 AM      Result Value Range   Glucose-Capillary 103 (*) 70 - 99 mg/dL  GLUCOSE, CAPILLARY     Status: Abnormal   Collection Time    10/21/13  4:30 PM      Result Value Range   Glucose-Capillary 162 (*) 70 - 99 mg/dL  GLUCOSE, CAPILLARY     Status: Abnormal   Collection Time    10/21/13  8:05 PM      Result Value Range   Glucose-Capillary 124 (*) 70 - 99 mg/dL  GLUCOSE, CAPILLARY     Status: Abnormal   Collection Time    10/21/13 11:53 PM      Result Value Range   Glucose-Capillary 137 (*) 70 - 99 mg/dL  GLUCOSE, CAPILLARY     Status: Abnormal   Collection Time    10/22/13  5:19 AM      Result Value Range   Glucose-Capillary 165 (*) 70 - 99 mg/dL  BASIC METABOLIC PANEL     Status: Abnormal   Collection Time    10/22/13  5:30 AM      Result Value Range   Sodium 134 (*) 135 - 145 mEq/L   Potassium 3.7  3.5 - 5.1 mEq/L   Chloride 98  96 - 112 mEq/L   CO2 26  19 - 32 mEq/L   Glucose, Bld 164 (*) 70 - 99 mg/dL   BUN 15  6 - 23 mg/dL   Creatinine, Ser 8.29  0.50 - 1.35 mg/dL   Calcium 9.0  8.4 - 56.2 mg/dL   GFR calc non Af Amer >90  >90 mL/min   GFR calc Af Amer >90  >90 mL/min   Comment: (NOTE)     The eGFR has been calculated using the CKD EPI equation.     This calculation has not been validated in all clinical situations.     eGFR's persistently <90 mL/min signify possible Chronic Kidney     Disease.  CBC     Status: Abnormal   Collection Time    10/22/13  5:30 AM      Result Value Range   WBC 14.7 (*) 4.0 - 10.5 K/uL   Comment: WHITE COUNT CONFIRMED ON SMEAR   RBC 3.50 (*) 4.22 - 5.81 MIL/uL   Hemoglobin 11.4 (*) 13.0 - 17.0 g/dL   HCT 13.0 (*) 86.5 - 78.4 %   MCV 97.7  78.0 - 100.0 fL   MCH 32.6  26.0 - 34.0 pg   MCHC 33.3  30.0 - 36.0 g/dL   RDW 69.6 (*) 29.5 - 28.4 %   Platelets 261  150 - 400 K/uL   Comment: PLATELET CLUMPS NOTED ON SMEAR  GLUCOSE, CAPILLARY     Status: Abnormal   Collection Time    10/22/13  7:25 AM      Result Value Range    Glucose-Capillary 116 (*) 70 - 99 mg/dL  GLUCOSE, CAPILLARY     Status: Abnormal   Collection Time    10/22/13 12:01 PM      Result Value Range  Glucose-Capillary 116 (*) 70 - 99 mg/dL  GLUCOSE, CAPILLARY     Status: Abnormal   Collection Time    10/22/13  4:50 PM      Result Value Range   Glucose-Capillary 158 (*) 70 - 99 mg/dL  GLUCOSE, CAPILLARY     Status: Abnormal   Collection Time    10/22/13  8:27 PM      Result Value Range   Glucose-Capillary 129 (*) 70 - 99 mg/dL  GLUCOSE, CAPILLARY     Status: Abnormal   Collection Time    10/23/13 12:02 AM      Result Value Range   Glucose-Capillary 158 (*) 70 - 99 mg/dL  GLUCOSE, CAPILLARY     Status: Abnormal   Collection Time    10/23/13  4:10 AM      Result Value Range   Glucose-Capillary 140 (*) 70 - 99 mg/dL  COMPREHENSIVE METABOLIC PANEL     Status: Abnormal   Collection Time    10/23/13  5:25 AM      Result Value Range   Sodium 136  135 - 145 mEq/L   Potassium 3.8  3.5 - 5.1 mEq/L   Chloride 99  96 - 112 mEq/L   CO2 26  19 - 32 mEq/L   Glucose, Bld 141 (*) 70 - 99 mg/dL   BUN 17  6 - 23 mg/dL   Creatinine, Ser 1.61  0.50 - 1.35 mg/dL   Calcium 8.7  8.4 - 09.6 mg/dL   Total Protein 5.5 (*) 6.0 - 8.3 g/dL   Albumin 2.3 (*) 3.5 - 5.2 g/dL   AST 30  0 - 37 U/L   ALT 66 (*) 0 - 53 U/L   Alkaline Phosphatase 122 (*) 39 - 117 U/L   Total Bilirubin 0.2 (*) 0.3 - 1.2 mg/dL   GFR calc non Af Amer >90  >90 mL/min   GFR calc Af Amer >90  >90 mL/min   Comment: (NOTE)     The eGFR has been calculated using the CKD EPI equation.     This calculation has not been validated in all clinical situations.     eGFR's persistently <90 mL/min signify possible Chronic Kidney     Disease.  MAGNESIUM     Status: None   Collection Time    10/23/13  5:25 AM      Result Value Range   Magnesium 1.8  1.5 - 2.5 mg/dL  PHOSPHORUS     Status: None   Collection Time    10/23/13  5:25 AM      Result Value Range   Phosphorus 3.6  2.3 - 4.6  mg/dL  CBC     Status: Abnormal   Collection Time    10/23/13  5:25 AM      Result Value Range   WBC 14.2 (*) 4.0 - 10.5 K/uL   RBC 3.39 (*) 4.22 - 5.81 MIL/uL   Hemoglobin 11.1 (*) 13.0 - 17.0 g/dL   HCT 04.5 (*) 40.9 - 81.1 %   MCV 97.9  78.0 - 100.0 fL   MCH 32.7  26.0 - 34.0 pg   MCHC 33.4  30.0 - 36.0 g/dL   RDW 91.4 (*) 78.2 - 95.6 %   Platelets 257  150 - 400 K/uL  DIFFERENTIAL     Status: Abnormal   Collection Time    10/23/13  5:25 AM      Result Value Range   Neutrophils Relative % 88 (*) 43 -  77 %   Lymphocytes Relative 5 (*) 12 - 46 %   Monocytes Relative 7  3 - 12 %   Eosinophils Relative 0  0 - 5 %   Basophils Relative 0  0 - 1 %   Neutro Abs 12.5 (*) 1.7 - 7.7 K/uL   Lymphs Abs 0.7  0.7 - 4.0 K/uL   Monocytes Absolute 1.0  0.1 - 1.0 K/uL   Eosinophils Absolute 0.0  0.0 - 0.7 K/uL   Basophils Absolute 0.0  0.0 - 0.1 K/uL   RBC Morphology POLYCHROMASIA PRESENT     Comment: RARE NRBCs   WBC Morphology MILD LEFT SHIFT (1-5% METAS, OCC MYELO, OCC BANDS)    TRIGLYCERIDES     Status: Abnormal   Collection Time    10/23/13  5:25 AM      Result Value Range   Triglycerides 361 (*) <150 mg/dL   Comment: Performed at [facility]  GLUCOSE, CAPILLARY     Status: Abnormal   Collection Time    10/23/13  8:15 AM      Result Value Range   Glucose-Capillary 109 (*) 70 - 99 mg/dL   Comment 1 Documented in Chart     Comment 2 Notify RN      Imaging / Studies: Ct Head Wo Contrast  10/22/2013   CLINICAL DATA:  Evaluate for recent seizures. Glioblastoma status post surgery and radiotherapy.  EXAM: CT HEAD WITHOUT CONTRAST  TECHNIQUE: Contiguous axial images were obtained from the base of the skull through the vertex without contrast.  COMPARISON:  Metabolic brain scan 09/26/2013. MRI brain 09/08/13.  FINDINGS: Left parietal hypodensity at the surgical site with moderate surrounding vasogenic edema. No significant midline shift for impending herniation. As seen on image  23 cross-sectional measurements of 26 x 28 mm. There is slightly less mass effect when compared with prior MRI with regard to regional vasogenic edema. There is no superimposed hemorrhage, midline shift, or new intracranial abnormalities. Unremarkable appearing craniotomy defect.  IMPRESSION: Left parietal lesion appears stable to slightly improved with regard to mass effect and surrounding edema. No new abnormalities are detected on this noncontrast exam. 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   Electronically Signed   By: Davonna Belling M.D.   On: 10/22/2013 14:41    Medications / Allergies: per chart  Antibiotics: Anti-infectives   Start     Dose/Rate Route Frequency Ordered Stop   10/13/13 1800  fluconazole (DIFLUCAN) IVPB 200 mg     200 mg 100 mL/hr over 60 Minutes Intravenous Every 24 hours 10/13/13 1707 10/22/13 1906   10/13/13 1715  ertapenem (INVANZ) 1 g in sodium chloride 0.9 % 50 mL IVPB  Status:  Discontinued     1 g 100 mL/hr over 30 Minutes Intravenous Every 24 hours 10/13/13 1707 10/13/13 1750   10/12/13 1600  fluconazole (DIFLUCAN) tablet 100 mg  Status:  Discontinued     100 mg Oral Daily 10/12/13 1428 10/13/13 1707   10/12/13 1600  sulfamethoxazole-trimethoprim (BACTRIM DS) 800-160 MG per tablet 1 tablet  Status:  Discontinued     1 tablet Oral Daily 10/12/13 1428 10/13/13 1707   10/12/13 1600  ertapenem (INVANZ) 1 g in sodium chloride 0.9 % 50 mL IVPB     1 g 100 mL/hr over 30 Minutes Intravenous Every 24 hours 10/12/13 1507

## 2013-10-23 NOTE — Progress Notes (Signed)
Patient having episodes of seizures this morning, Ativan 2 mg given,MD made aware.patient cannot recall what had happened.  Sister at bedside witnessed pt having seizures.

## 2013-10-23 NOTE — Progress Notes (Signed)
NEURO HOSPITALIST PROGRESS NOTE   SUBJECTIVE:                                                                                                                        Patient is very drowsy, he will follow commands and answer questions without difficulty.  He knows he had a seizure this AM but does not recall the event--just that nurses had told him.   OBJECTIVE:                                                                                                                           Vital signs in last 24 hours: Temp:  [97.7 F (36.5 C)-98.5 F (36.9 C)] 97.9 F (36.6 C) (11/03 0414) Pulse Rate:  [100-130] 109 (11/03 0414) Resp:  [16-24] 16 (11/03 0414) BP: (125-139)/(90-108) 131/108 mmHg (11/03 0414) SpO2:  [96 %-97 %] 96 % (11/03 0414)  Intake/Output from previous day: 11/02 0701 - 11/03 0700 In: 3420 [P.O.:240; I.V.:1053; IV Piggyback:255; QIO:9629] Out: 2485.5 [Urine:2450; Drains:35.5] Intake/Output this shift: Total I/O In: -  Out: 400 [Urine:400] Nutritional status: Full Liquid  Past Medical History  Diagnosis Date  . Hyperlipidemia   . Right bundle branch block     Normal LV function normal aortic and mitral valve bedside echocardiogram 4 2012  . Tachycardia     Resolved  . Dyslipidemia   . Coronary artery disease (CAD) excluded     Cardiolite study 2006 within normal limits  . Hypertension   . Allergy   . Cancer      Neurologic Exam:  Mental Status: Alert, oriented, thought content appropriate.  Speech fluent without evidence of aphasia.  Able to follow 3 step commands without difficulty. Cranial Nerves: II: Visual fields grossly normal, pupils equal, round, reactive to light and accommodation III,IV, VI: ptosis not present, extra-ocular motions intact bilaterally V,VII: smile symmetric, facial light touch sensation normal bilaterally VIII: hearing normal bilaterally IX,X: gag reflex present XI: bilateral shoulder  shrug XII: midline tongue extension without atrophy or fasciculations  Motor: Right : Upper extremity   5/5    Left:     Upper extremity   5/5  Lower extremity   5/5     Lower extremity  5/5 Tone and bulk:normal tone throughout; no atrophy noted Sensory: Pinprick and light touch intact throughout, bilaterally Deep Tendon Reflexes:  Right: Upper Extremity   Left: Upper extremity   biceps (C-5 to C-6) 2/4   biceps (C-5 to C-6) 2/4 tricep (C7) 2/4    triceps (C7) 2/4 Brachioradialis (C6) 2/4  Brachioradialis (C6) 2/4  Lower Extremity Lower Extremity  quadriceps (L-2 to L-4) 2/4   quadriceps (L-2 to L-4) 2/4 Achilles (S1) 2/4   Achilles (S1) 2/4  Plantars: Right: downgoing   Left: downgoing    Lab Results: No results found for this basename: cbc, bmp, coags, chol, tri, ldl, hga1c   Lipid Panel  Recent Labs  10/23/13 0525  TRIG 361*    Studies/Results: Ct Head Wo Contrast  10/22/2013   CLINICAL DATA:  Evaluate for recent seizures. Glioblastoma status post surgery and radiotherapy.  EXAM: CT HEAD WITHOUT CONTRAST  TECHNIQUE: Contiguous axial images were obtained from the base of the skull through the vertex without contrast.  COMPARISON:  Metabolic brain scan 25/95/6387. MRI brain 09/08/13.  FINDINGS: Left parietal hypodensity at the surgical site with moderate surrounding vasogenic edema. No significant midline shift for impending herniation. As seen on image 23 cross-sectional measurements of 26 x 28 mm. There is slightly less mass effect when compared with prior MRI with regard to regional vasogenic edema. There is no superimposed hemorrhage, midline shift, or new intracranial abnormalities. Unremarkable appearing craniotomy defect.  IMPRESSION: Left parietal lesion appears stable to slightly improved with regard to mass effect and surrounding edema. No new abnormalities are detected on this noncontrast exam. The lesion remains indeterminate for radiation necrosis versus  glioblastoma recurrence.   Electronically Signed   By: Rolla Flatten M.D.   On: 10/22/2013 14:41    MEDICATIONS                                                                                                                        Scheduled: . antiseptic oral rinse  15 mL Mouth Rinse q12n4p  . calcium carbonate  1 tablet Oral Daily  . chlorhexidine  15 mL Mouth Rinse BID  . cloNIDine  0.1 mg Transdermal Weekly  . dexamethasone  4 mg Intravenous Q12H  . enoxaparin (LOVENOX) injection  40 mg Subcutaneous Q24H  . ertapenem (INVANZ) IV  1 g Intravenous Q24H  . fentaNYL  25 mcg Transdermal Q72H  . insulin aspart  0-20 Units Subcutaneous Q4H  . levETIRAcetam  1,000 mg Intravenous Q12H  . levETIRAcetam  500 mg Intravenous NOW  . lip balm  1 application Topical BID  . metoprolol  10 mg Intravenous Q6H  . pantoprazole  40 mg Oral BID  . saccharomyces boulardii  250 mg Oral BID  . sodium chloride  10-40 mL Intracatheter Q12H    ASSESSMENT/PLAN:  57 yo M with GBM and seizures and worsening aphasia following downtitration of steroids. Currently back on Dexamethasone 4 mg BID.  Patient did have a seizure this AM while being on Keppra 500 mg BID. He is now drowsy but able to follow commands. Will load with 500 mg Keppra IV now and then increase Keppra to 1 Gram BID IV.  Hospitalist are currently getting in touch with Oncology.    Assessment and plan discussed with with attending physician and they are in agreement.    Etta Quill PA-C [facility] [phone]  10/23/2013, 10:47 AM

## 2013-10-23 NOTE — Progress Notes (Signed)
NUTRITION FOLLOW UP  Intervention:   TPN per Pharmacy  Diet advancement per MD discretion  RD to continue to Monitor nutrition care plan Start 48 Hours Calorie Count to help determine adequacy of PO intake Recommend daily weights  Nutrition Dx:   Inadequate oral intake related to inability to eat as evidenced by pt NPO due to perforated colon; ongoing- diet advanced but, inadequate intake continues  Goal:   Pt to meet >/= 90% of their estimated nutrition needs; being met  Monitor:   TPN rate; decreased rate as diet has been advanced to full liquids Weight trends; no new weight since 10/28 Labs: blood glucose range 109-195; Potassium, Magnesium, and Phosphorus all WNL; high triglycerides; low hemoglobin Diet advancement: clear liquids 10/20 and full liquids 11/1 Bowel function: BM 10/30   Assessment:   57 y/o who is undergoing chemo and radiation therapy for a recurrent glioblastoma. He was seen last month and found to have a left parietal mass lesion showing progression in the wall of the mass with central necrosis, on MRI. His last treatment with Avastin was 10/09/13. Since that time he started having abdominal pain, which has become progressively worse. He presented to the ER with findings on CT showing free air LUQ, and descending colon. Multiple colonic diverticula, small bowel loops with thickening And a fluid collection 5.2 x 3.7 cm. S/P diagnostic laparoscopy and Placement of Drains. Started on TPN due to prolonged post-op ileus.  10/28: Pt states he was eating well PTA. He reports that he usually weighs 180 lbs but, has gained weight since he started taking steroids. Pt reports having a good appetite today and expresses desire to eat. Triglycerides today were 404 mg/dL (very high).  11/3: Diet advanced to clear liquids 10/30 and full liquids on 11/1. Pt reports having a good appetite and eating 100% of everything he orders at meals. He reports feeling well after eating; denies pain  and nausea. Pt on full liquid Carb Modified diet; concerned that diet is too restrictive but, pt states that he has been able to get all food items that he has requested so far. Pt does not like Ensure Complete and is not interested in trying any nutritional supplements at this time. Encouraged PO intake as tolerated; discussed estimated energy/protein needs with pt. Current TPN: Clinimix E 5/20 @ 80 ml/hr (Provides 1690 kcal and 96 grams of protein without lipids). TPN never did get to goal rate of 100 ml/hr as diet was advanced 10/30. Triglycerides: 404 (10/28), 344 (10/29), 293 (10/31), elevated to 361 (11/3). Per chart, pt had a seizure overnight. Per surgery note 11/2, do not plan on advancing diet past full liquids for quite some time.   Calorie Count 11/2 (Based on pt's report that he ate 100% of everything) Diet: Full Liquids  Breakfast: 510 kcal, 11 grams protein Lunch: 457 kcal, 16 grams protein Dinner: 424 kcal, 16 grams protein  Total intake: 1391 kcal (60% of minimum estimated needs)  43 protein (39% of minimum estimated needs)   Height: Ht Readings from Last 1 Encounters:  10/13/13 6' (1.829 m)    Weight Status:   Wt Readings from Last 1 Encounters:  10/17/13 195 lb 5.2 oz (88.6 kg)    Re-estimated needs:  Kcal: 2300-2500  Protein: 110-125 grams  Fluid: 2.3-2.5 L/day  Skin: multiple abdominal incisions with closed system drains  Diet Order: Full Liquid   Intake/Output Summary (Last 24 hours) at 10/23/13 1314 Last data filed at 10/23/13 1258  Gross per  24 hour  Intake   3780 ml  Output   4118 ml  Net   -338 ml    Last BM: 10/30   Labs:   Recent Labs Lab 10/17/13 0458  10/19/13 0455  10/21/13 0440 10/22/13 0530 10/23/13 0525  NA 133*  < > 135  < > 133* 134* 136  K 3.9  < > 3.9  < > 4.2 3.7 3.8  CL 94*  < > 99  < > 99 98 99  CO2 26  < > 24  < > 25 26 26   BUN 15  < > 16  < > 16 15 17   CREATININE 0.63  < > 0.58  < > 0.60 0.55 0.53  CALCIUM 9.4  <  > 9.0  < > 9.4 9.0 8.7  MG 2.0  --  1.9  --   --   --  1.8  PHOS 3.1  --  3.1  --   --   --  3.6  GLUCOSE 195*  < > 158*  < > 153* 164* 141*  < > = values in this interval not displayed.  CBG (last 3)   Recent Labs  10/23/13 0410 10/23/13 0815 10/23/13 1230  GLUCAP 140* 109* 132*    Scheduled Meds: . antiseptic oral rinse  15 mL Mouth Rinse q12n4p  . calcium carbonate  1 tablet Oral Daily  . chlorhexidine  15 mL Mouth Rinse BID  . cloNIDine  0.1 mg Transdermal Weekly  . dexamethasone  4 mg Intravenous Q12H  . enoxaparin (LOVENOX) injection  40 mg Subcutaneous Q24H  . ertapenem (INVANZ) IV  1 g Intravenous Q24H  . fentaNYL  25 mcg Transdermal Q72H  . insulin aspart  0-20 Units Subcutaneous Q4H  . levETIRAcetam  1,000 mg Intravenous Q12H  . lip balm  1 application Topical BID  . metoprolol  10 mg Intravenous Q6H  . multivitamin with minerals  1 tablet Oral Daily  . pantoprazole  40 mg Oral BID  . saccharomyces boulardii  250 mg Oral BID  . sodium chloride  10-40 mL Intracatheter Q12H    Continuous Infusions: . Marland KitchenTPN (CLINIMIX-E) Adult 80 mL/hr at 10/22/13 1717  . 0.9 % NaCl with KCl 20 mEq / L 45 mL/hr (10/23/13 1301)  . Marland KitchenTPN (CLINIMIX-E) Adult     And  . fat emulsion      Ian Malkin RD, LDN Inpatient Clinical Dietitian Pager: [phone] After Hours Pager: [phone]

## 2013-10-24 ENCOUNTER — Inpatient Hospital Stay (HOSPITAL_COMMUNITY): Payer: BC Managed Care – PPO

## 2013-10-24 ENCOUNTER — Ambulatory Visit: Payer: BC Managed Care – PPO | Admitting: Adult Health

## 2013-10-24 DIAGNOSIS — T8189XA Other complications of procedures, not elsewhere classified, initial encounter: Secondary | ICD-10-CM

## 2013-10-24 DIAGNOSIS — R569 Unspecified convulsions: Secondary | ICD-10-CM

## 2013-10-24 DIAGNOSIS — G40802 Other epilepsy, not intractable, without status epilepticus: Secondary | ICD-10-CM

## 2013-10-24 DIAGNOSIS — M6281 Muscle weakness (generalized): Secondary | ICD-10-CM

## 2013-10-24 DIAGNOSIS — R5381 Other malaise: Secondary | ICD-10-CM

## 2013-10-24 DIAGNOSIS — C713 Malignant neoplasm of parietal lobe: Secondary | ICD-10-CM

## 2013-10-24 LAB — GLUCOSE, CAPILLARY
Glucose-Capillary: 126 mg/dL — ABNORMAL HIGH (ref 70–99)
Glucose-Capillary: 128 mg/dL — ABNORMAL HIGH (ref 70–99)
Glucose-Capillary: 152 mg/dL — ABNORMAL HIGH (ref 70–99)
Glucose-Capillary: 162 mg/dL — ABNORMAL HIGH (ref 70–99)

## 2013-10-24 LAB — BASIC METABOLIC PANEL
Calcium: 9 mg/dL (ref 8.4–10.5)
GFR calc Af Amer: >90 mL/min (ref >90)
GFR calc non Af Amer: >90 mL/min (ref >90)
Glucose, Bld: 141 mg/dL — ABNORMAL HIGH (ref 70–99)
Sodium: 136 mEq/L (ref 135–145)

## 2013-10-24 MED ORDER — CLINIMIX E/DEXTROSE (5/20) 5 % IV SOLN
INTRAVENOUS | Status: AC
  Administered 2013-10-24: 18:00:00 via INTRAVENOUS
  Filled 2013-10-24: qty 2000

## 2013-10-24 MED ORDER — DOCUSATE SODIUM 100 MG PO CAPS
100.0000 mg | ORAL_CAPSULE | Freq: Two times a day (BID) | ORAL | Status: DC | PRN
  Filled 2013-10-24: qty 1

## 2013-10-24 MED ORDER — GADOBENATE DIMEGLUMINE 529 MG/ML IV SOLN
18.0000 mL | Freq: Once | INTRAVENOUS | Status: AC | PRN
  Administered 2013-10-24: 18 mL via INTRAVENOUS

## 2013-10-24 MED ORDER — SODIUM CHLORIDE 0.9 % IV SOLN
1250.0000 mg | Freq: Two times a day (BID) | INTRAVENOUS | Status: DC
  Administered 2013-10-24 – 2013-10-25 (×2): 1250 mg via INTRAVENOUS
  Filled 2013-10-24 (×3): qty 12.5

## 2013-10-24 MED ORDER — POLYETHYLENE GLYCOL 3350 17 G PO PACK
17.0000 g | PACK | Freq: Every day | ORAL | Status: DC | PRN
  Filled 2013-10-24: qty 1

## 2013-10-24 MED ORDER — POLYETHYLENE GLYCOL 3350 17 G PO PACK
17.0000 g | PACK | Freq: Every day | ORAL | Status: DC
  Administered 2013-10-24 – 2013-11-01 (×8): 17 g via ORAL
  Filled 2013-10-24 (×11): qty 1

## 2013-10-24 MED ORDER — PIPERACILLIN-TAZOBACTAM 3.375 G IVPB
3.3750 g | Freq: Three times a day (TID) | INTRAVENOUS | Status: DC
  Administered 2013-10-24 – 2013-10-25 (×4): 3.375 g via INTRAVENOUS
  Filled 2013-10-24 (×5): qty 50

## 2013-10-24 MED ORDER — DEXAMETHASONE SODIUM PHOSPHATE 4 MG/ML IJ SOLN
4.0000 mg | Freq: Four times a day (QID) | INTRAMUSCULAR | Status: DC
  Administered 2013-10-24 – 2013-10-27 (×12): 4 mg via INTRAVENOUS
  Filled 2013-10-24 (×17): qty 1

## 2013-10-24 NOTE — Progress Notes (Signed)
OT Cancellation Note  Patient Details Name: [name] MRN: [id number] DOB: December 05, 1956   Cancelled Treatment:    Reason Eval/Treat Not Completed: Medical issues which prohibited therapy. Pt has had several seizures today per nursing. He is scheduled for an MRI later today.  Will likely return tomorrow.    Roan Sawchuk 10/24/2013, 12:49 PM Marica Otter, OTR/L [phone] 10/24/2013

## 2013-10-24 NOTE — Progress Notes (Signed)
[facility] PROGRESS NOTE  TERRE ZABRISKIE ZOX:[id number] DOB: 20-Aug-1956 DOA: 10/12/2013 PCP: [name], MD Brief Narrative  Pt is a 57 y.o. male male with a PMH of glioblastoma of the left parietal brain, status post biopsy/debulking in April 2014 followed by radiation and adjuvant chemotherapy consisting of Temodar, and was being treated with Avastin every 2 weeks; and subsequently was started on steroids secondary to progression of disease with right-sided weakness in September 2014. He wasadmitted with a 2-3 day history of abdominal pain and abdominal bloating, no  BM for 2 days prior despite treatment with magnesium citrate and only minimal results with a Fleets enema. 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 Sided sz activity) given ativan>> consulted neurology and pt seen by Dr Amada Jupiter - after discussing Neuro's recs with Dr Maisie Fus 11/2 given concerns about healing from perf>> she states not ideal but if needed as per neuro then to increase steroids>> he Was also given extra dose of decadron then - Keppra dose again increased today 11/4 to 1250mg  BID per neuro as he is still having seizures -appreciate oncology assistance, pt seen by Dr Myna Hidalgo and steroids increased to q6 and he will follow and wean -MRI done today 11/4 confirms Interval decrease in size of left parietal mass with improved vasogenic edema and resolved midline shift as compared to prior MRI  from 09/08/2013. Persistent peripheral rim enhancement about this  lesion may represent residual tumor versus radiation necrosis -  Dr Malachi Bonds discussed pt with Onc and stated -No need for further PCP prophylaxis per Dr. Welton Flakes, Patient may live a long time per Dr. Welton Flakes - His ONC care transitioned from Dr Khan>>Dr Myna Hidalgo per pt/family's  request  Diverticulitis of colon with perforation.   -  Continue empiric ertapenem.  Plan was not to narrow antibiotics based on culture and to continue to treat broadly for bowel perforation until we know that the perforation has sealed. -  POD 5 s/p diagnostic laparoscopy with washout.  Perforation was not identified.  -  Body fluid culture:  E. coli -  NG out 10/29, and surgery to follow and patient started on clears today(10/29)-  Continue IV nutrition - CT of 11/3 still shows extraluminal gas within the soft tissues of the  left upper quadrant of the abdomen and extending into the posterior  Mediastinum compatible with known perforated viscus -Diet advanced to bland diet per surgery, appreciate surgery assistance - it is noted that patient has stated that he does not want any further surgeries  Chronic steroid use.   Continue dexamethasone as above  Hyperglycemia, likely stress response, and steroids.  Continue q6h insulin while on TNA and NPO  Thrush, stable.  Continue Diflucan.  CAD/HTN - diltiazem gtt DC'd today 10/30 at 10am after completing the planned 3days while awaiting clonidine patch, mildly tachycardia overall rate and blood pressure remains controlled at this time, will follow  -  Continue metoprolol 10 mg q6h -  Continue clonidine patch started 10/27 -  Patient not on statin or ASA -He is tolerating solids now , but also now still having frequent seizures>> follow and once seizures controlled plan is to d/c clonidine and iv metoprolol and resume his outpt metoprolol and cardizem  Hyponatremia and hypochloremia, were likely due to dehydration and fluids shifts. -  Resolved with hydration, follow  leukocytosis -CT  abd/pelvis last night looked stable and he is not having any further symptoms. - likely due to steroids -repeat UA was neg, monitoring #1 and surgery also following  - will likely continue to go up with increased steroids as noted above,follow and recheck in  am  Mild anemia  likely secondary to acute illness. Hgb stable.   Diet:  continue TPN Access:  PIV Proph:  SCDs  Code Status: full Family Communication: none at bedside Disposition Plan:  Pending clinical course  Consultants:  General Surgery, Dr. Derrell Lolling  Procedures:  Diagnostic Laparoscopy  Antibiotics:  Ertapenem 10/23 >>   HPI/Subjective:  Pt with seizure x 3 this am per nsg>>stopped with ATivan Objective: Filed Vitals:   10/23/13 1744 10/23/13 2032 10/24/13 0438 10/24/13 1335  BP: 131/86 129/91 132/94 115/81  Pulse: 119 107 79 87  Temp:  98.2 F (36.8 C) 97.5 F (36.4 C) 97.6 F (36.4 C)  TempSrc:  Axillary Oral Oral  Resp:  20 16 20   Height:      Weight:      SpO2:  97% 99% 93%    Intake/Output Summary (Last 24 hours) at 10/24/13 1711 Last data filed at 10/24/13 1455  Gross per 24 hour  Intake 2354.33 ml  Output   1921 ml  Net 433.33 ml   Filed Weights   10/16/13 0400 10/17/13 0500 10/23/13 1400  Weight: 89.5 kg (197 lb 5 oz) 88.6 kg (195 lb 5.2 oz) 88.9 kg (195 lb 15.8 oz)    Exam:   General:  S/p seizure with word finding difficulty but oriented x3 No acute distress  HEENT:  NCAT, MMM  Cardiovascular:  RRR, nl S1, S2 no mrg, 2+ pulses, warm extremities  Respiratory:  CTAB, no increased WOB  Abdomen:   + BS, soft, moderately distended, NT in the inferior quadrants without rebound or guarding.  Drains in place, purulence from upper left tube  MSK:   Normal tone and bulk, no LEE  Neuro:  Grossly intact  Data Reviewed: Basic Metabolic Panel:  Recent Labs Lab 10/19/13 0455 10/20/13 0340 10/21/13 0440 10/22/13 0530 10/23/13 0525 10/24/13 0605  NA 135 136 133* 134* 136 136  K 3.9 3.9 4.2 3.7 3.8 3.9  CL 99 100 99 98 99 99  CO2 24 25 25 26 26 27   GLUCOSE 158* 166* 153* 164* 141* 141*  BUN 16 17 16 15 17 15   CREATININE 0.58 0.60 0.60 0.55 0.53 0.51  CALCIUM 9.0 9.1 9.4 9.0 8.7 9.0  MG 1.9  --   --   --  1.8  --   PHOS 3.1  --    --   --  3.6  --    Liver Function Tests:  Recent Labs Lab 10/19/13 0455 10/23/13 0525  AST 24 30  ALT 41 66*  ALKPHOS 81 122*  BILITOT 0.3 0.2*  PROT 6.2 5.5*  ALBUMIN 2.4* 2.3*   No results found for this basename: LIPASE, AMYLASE,  in the last 168 hours No results found for this basename: AMMONIA,  in the last 168 hours CBC:  Recent Labs Lab 10/19/13 0455 10/20/13 0512 10/22/13 0530 10/23/13 0525  WBC 13.2* 13.3* 14.7* 14.2*  NEUTROABS  --   --   --  12.5*  HGB 12.0* 11.7* 11.4* 11.1*  HCT 34.7* 34.8* 34.2* 33.2*  MCV 97.2 96.9 97.7 97.9  PLT 233 256 261 257   Cardiac Enzymes: No results found for this basename: CKTOTAL, CKMB, CKMBINDEX, TROPONINI,  in the  last 168 hours BNP (last 3 results) No results found for this basename: PROBNP,  in the last 8760 hours CBG:  Recent Labs Lab 11/08/2013 0341 2013-11-08 0434 11-08-2013 0738 11-08-2013 1143 11/08/13 1646  GLUCAP 152* 147* 121* 128* 126*    Recent Results (from the past 240 hour(s))  URINE CULTURE     Status: None   Collection Time    10/20/13  6:30 AM      Result Value Range Status   Specimen Description URINE, CLEAN CATCH   Final   Special Requests NONE   Final   Culture  Setup Time     Final   Value: 10/20/2013 09:23     Performed at [facility] Count     Final   Value: NO GROWTH     Performed at [facility]   Culture     Final   Value: NO GROWTH     Performed at [facility]   Report Status 10/21/2013 FINAL   Final     Studies: Mr Lodema Pilot Contrast  11-08-2013   CLINICAL DATA:  History of glioblastoma multiform and, new onset of recurrent seizures. Evaluate for tumor progression. History of tumor resection and radiotherapy.  EXAM: MRI HEAD WITHOUT AND WITH CONTRAST  TECHNIQUE: Multiplanar, multiecho pulse sequences of the brain and surrounding structures were obtained according to standard protocol without and with intravenous contrast  CONTRAST:  18mL  MULTIHANCE GADOBENATE DIMEGLUMINE 529 MG/ML IV SOLN  COMPARISON:  Prior CT from 10/22/2013 and MRI from 09/08/2013.  FINDINGS: The post contrast sequences are somewhat degraded by motion artifact.  Previously identified left parietal mass is decreased in size now measuring approximately 4.9 x 2.9 x 3.4 cm (series 14, image 35). This lesion measured approximately 4.1 x 5.6 x 4.4 cm on prior study. Associated vasogenic edema at these improved with decreased T2/FLAIR signal seen within the adjacent left parietal white matter. There is improved mass effect on the adjacent posterior horn of the left lateral ventricle. Restricted diffusion seen centrally with numbness mass is present, like related to necrosis and/ or postoperative changes. An irregular peripheral rim hemosiderin deposition and/ or blood products is present about the central cavity of this lesion. There is persistent irregular peripheral rim enhancement about the margins of the lesion, improved as compared to the prior exam.  Sequelae of left parietal craniotomy is seen overlying the mass. There is no extra-axial fluid collection.  No other abnormal foci of restricted diffusion are seen to suggest acute intracranial infarct. Normal flow voids are seen within the intracranial vasculature. No other mass lesion is identified. No mass effect is now seen within the brain. No other abnormal enhancement identified.  IMPRESSION: 1. Interval decrease in size of left parietal mass with improved vasogenic edema and resolved midline shift as compared to prior MRI from 09/08/2013. Persistent peripheral rim enhancement about this lesion may represent residual tumor versus radiation necrosis. 2. No other acute intracranial process identified within the brain.   Electronically Signed   By: Rise Mu M.D.   On: Nov 08, 2013 16:50   Ct Abdomen Pelvis W Contrast  10/23/2013   CLINICAL DATA:  Abdominal pain. Diverticulitis with colonic perforation.  EXAM: CT  ABDOMEN AND PELVIS WITH CONTRAST  TECHNIQUE: Multidetector CT imaging of the abdomen and pelvis was performed using the standard protocol following bolus administration of intravenous contrast.  CONTRAST:  OMNIPAQUE IOHEXOL 300 MG/ML  SOLN  COMPARISON:  10/16/2013  FINDINGS:  Atelectasis is identified within both lung bases. No focal liver abnormality identified. The gallbladder appears normal. No biliary dilatation. Normal appearance of the pancreas. The spleen is unremarkable.  The adrenal glands are both normal. Normal appearance of the right kidney. Left renal cyst is again identified. Nonobstructing calculus within the inferior pole of the left kidney measures 5 mm, image 51/ series 2. The urinary bladder appears within normal limits. Prostate gland and seminal vesicles are negative.  Normal caliber of the abdominal aorta. No upper abdominal adenopathy noted. There is no pelvic or inguinal adenopathy.  The stomach appears normal. Small bowel loops appear mildly increased in caliber compatible with the clinical history of ileus. Again noted is extra luminal gas within the soft tissues of the left upper quadrant of the abdomen. Gas can be seen tracking around the stomach and into the posterior mediastinum. This appears similar to previous exam.  Multiple fluid collections are again noted within the upper abdomen. Index fluid collection ventral to the duodenum measures 4.1 cm, image 45/ series 2. Previously 3.7 cm. Index fluid collection posterior to the descending colon measures 4.8 cm, image 50/ series 2. This is compared with 5.6 cm previously. Fluid collection within the root of the mesenteric measures 4.1 x 1.9 cm, image 53/ series 2. Previously 5.2 x 2.8 cm. No new fluid collections identified. Similar appearance of percutaneous to drainage catheters within the central portion of the lower abdomen.  Review of the visualized osseous structures is significant for lumbar spondylosis. No aggressive lytic or  sclerotic bone lesions.  IMPRESSION: 1. Multi focal fluid collections are again noted within the abdomen. When compared with the previous exam these are slightly decreased in size in the interval. No new or enlarging fluid collections identified.  2. Mild increased caliber of the small bowel loops compatible with ileus. No specific features identified to suggest high-grade bowel obstruction.  3. Again noted is extraluminal gas within the soft tissues of the left upper quadrant of the abdomen and extending into the posterior mediastinum. Findings a compatible with known perforated viscus.   Electronically Signed   By: Signa Kell M.D.   On: 10/23/2013 15:35    Scheduled Meds: . antiseptic oral rinse  15 mL Mouth Rinse q12n4p  . calcium carbonate  1 tablet Oral Daily  . chlorhexidine  15 mL Mouth Rinse BID  . cloNIDine  0.1 mg Transdermal Weekly  . dexamethasone  4 mg Intravenous Q6H  . enoxaparin (LOVENOX) injection  40 mg Subcutaneous Q24H  . fentaNYL  25 mcg Transdermal Q72H  . insulin aspart  0-20 Units Subcutaneous Q4H  . levETIRAcetam  1,250 mg Intravenous Q12H  . lip balm  1 application Topical BID  . metoprolol  10 mg Intravenous Q6H  . multivitamin with minerals  1 tablet Oral Daily  . pantoprazole  40 mg Oral BID  . piperacillin-tazobactam (ZOSYN)  IV  3.375 g Intravenous Q8H  . polyethylene glycol  17 g Oral Daily  . saccharomyces boulardii  250 mg Oral BID  . sodium chloride  10-40 mL Intracatheter Q12H   Continuous Infusions: . Marland KitchenTPN (CLINIMIX-E) Adult    . 0.9 % NaCl with KCl 20 mEq / L 45 mL/hr (10/23/13 1301)  . Marland KitchenTPN (CLINIMIX-E) Adult 80 mL/hr at 10/23/13 1729   And  . fat emulsion 240 mL (10/23/13 1729)    Principal Problem:   Diverticulitis of colon with perforation Active Problems:   Right sided weakness   Hyperglycemia   Thrush  Time spent: 35 min    Mieke Brinley C  [facility] Pager [phone]. If 7PM-7AM, please contact night-coverage at  [email or web address], password [facility] 10/24/2013, 5:11 PM  LOS: 12 days

## 2013-10-24 NOTE — Progress Notes (Signed)
11 Days Post-Op  Subjective: Pt having some twitching of his right arm/hand on multiple occasions while talking with patient and examining him.  He was completely aware and following commands.  He denies any abdominal pain or N/V.  Tolerating his bland diet well and has a good appetite.  He was having flatus and BM's yesterday (according to the patient), but last documented on 10/21/13.  No flatus or BM yet today.  Wants to get up and ambulate, but he notes the nurses are hesitant to get him up due to his seizures and fall risk.    Objective: Vital signs in last 24 hours: Temp:  [97.5 F (36.4 C)-98.2 F (36.8 C)] 97.5 F (36.4 C) (11/04 0438) Pulse Rate:  [79-122] 79 (11/04 0438) Resp:  [16-20] 16 (11/04 0438) BP: (129-132)/(81-94) 132/94 mmHg (11/04 0438) SpO2:  [94 %-99 %] 99 % (11/04 0438) Weight:  [195 lb 15.8 oz (88.9 kg)] 195 lb 15.8 oz (88.9 kg) (11/03 1400) Last BM Date: 10/21/13  Intake/Output from previous day: 11/03 0701 - 11/04 0700 In: 2644.3 [P.O.:480; I.V.:1125; IV Piggyback:50; TPN:989.3] Out: 9562 [ZHYQM:5784; Drains:45] Intake/Output this shift:    PE: Gen:  Alert, NAD, pleasant Abd: Soft, distended (unsure if this is his norm), NT to palp, few BS, suprapubic and LLQ drain with serous drainage, LUQ (splenic drain) with tan thick drainage, no rebound or guarding Neuro:  Right arm/hand spasms noted on exam  Lab Results:   Recent Labs  10/22/13 0530 10/23/13 0525  WBC 14.7* 14.2*  HGB 11.4* 11.1*  HCT 34.2* 33.2*  PLT 261 257   BMET  Recent Labs  10/23/13 0525 10/24/13 0605  NA 136 136  K 3.8 3.9  CL 99 99  CO2 26 27  GLUCOSE 141* 141*  BUN 17 15  CREATININE 0.53 0.51  CALCIUM 8.7 9.0   PT/INR No results found for this basename: LABPROT, INR,  in the last 72 hours CMP     Component Value Date/Time   NA 136 10/24/2013 0605   NA 141 10/09/2013 1300   K 3.9 10/24/2013 0605   K 4.4 10/09/2013 1300   CL 99 10/24/2013 0605   CL 102 06/05/2013 1406    CO2 27 10/24/2013 0605   CO2 21* 10/09/2013 1300   GLUCOSE 141* 10/24/2013 0605   GLUCOSE 181* 10/09/2013 1300   GLUCOSE 165* 06/05/2013 1406   BUN 15 10/24/2013 0605   BUN 19.6 10/09/2013 1300   CREATININE 0.51 10/24/2013 0605   CREATININE 0.7 10/09/2013 1300   CALCIUM 9.0 10/24/2013 0605   CALCIUM 9.3 10/09/2013 1300   PROT 5.5* 10/23/2013 0525   PROT 6.9 10/09/2013 1300   ALBUMIN 2.3* 10/23/2013 0525   ALBUMIN 2.6* 10/09/2013 1300   AST 30 10/23/2013 0525   AST 30 10/09/2013 1300   ALT 66* 10/23/2013 0525   ALT 124* 10/09/2013 1300   ALKPHOS 122* 10/23/2013 0525   ALKPHOS 89 10/09/2013 1300   BILITOT 0.2* 10/23/2013 0525   BILITOT 0.35 10/09/2013 1300   GFRNONAA >90 10/24/2013 0605   GFRAA >90 10/24/2013 0605   Lipase     Component Value Date/Time   LIPASE 39 10/12/2013 1030       Studies/Results: Ct Head Wo Contrast  10/22/2013   CLINICAL DATA:  Evaluate for recent seizures. Glioblastoma status post surgery and radiotherapy.  EXAM: CT HEAD WITHOUT CONTRAST  TECHNIQUE: Contiguous axial images were obtained from the base of the skull through the vertex without contrast.  COMPARISON:  Metabolic  brain scan 09/26/2013. MRI brain 09/08/13.  FINDINGS: Left parietal hypodensity at the surgical site with moderate surrounding vasogenic edema. No significant midline shift for impending herniation. As seen on image 23 cross-sectional measurements of 26 x 28 mm. There is slightly less mass effect when compared with prior MRI with regard to regional vasogenic edema. There is no superimposed hemorrhage, midline shift, or new intracranial abnormalities. Unremarkable appearing craniotomy defect.  IMPRESSION: Left parietal lesion appears stable to slightly improved with regard to mass effect and surrounding edema. No new abnormalities are detected on this noncontrast exam. 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   Electronically Signed   By: Davonna Belling  M.D.   On: 10/22/2013 14:41   Ct Abdomen Pelvis W Contrast  10/23/2013   CLINICAL DATA:  Abdominal pain. Diverticulitis with colonic perforation.  EXAM: CT ABDOMEN AND PELVIS WITH CONTRAST  TECHNIQUE: Multidetector CT imaging of the abdomen and pelvis was performed using the standard protocol following bolus administration of intravenous contrast.  CONTRAST:  OMNIPAQUE IOHEXOL 300 MG/ML  SOLN  COMPARISON:  10/16/2013  FINDINGS: Atelectasis is identified within both lung bases. No focal liver abnormality identified. The gallbladder appears normal. No biliary dilatation. Normal appearance of the pancreas. The spleen is unremarkable.  The adrenal glands are both normal. Normal appearance of the right kidney. Left renal cyst is again identified. Nonobstructing calculus within the inferior pole of the left kidney measures 5 mm, image 51/ series 2. The urinary bladder appears within normal limits. Prostate gland and seminal vesicles are negative.  Normal caliber of the abdominal aorta. No upper abdominal adenopathy noted. There is no pelvic or inguinal adenopathy.  The stomach appears normal. Small bowel loops appear mildly increased in caliber compatible with the clinical history of ileus. Again noted is extra luminal gas within the soft tissues of the left upper quadrant of the abdomen. Gas can be seen tracking around the stomach and into the posterior mediastinum. This appears similar to previous exam.  Multiple fluid collections are again noted within the upper abdomen. Index fluid collection ventral to the duodenum measures 4.1 cm, image 45/ series 2. Previously 3.7 cm. Index fluid collection posterior to the descending colon measures 4.8 cm, image 50/ series 2. This is compared with 5.6 cm previously. Fluid collection within the root of the mesenteric measures 4.1 x 1.9 cm, image 53/ series 2. Previously 5.2 x 2.8 cm. No new fluid collections identified. Similar appearance of percutaneous to drainage  catheters within the central portion of the lower abdomen.  Review of the visualized osseous structures is significant for lumbar spondylosis. No aggressive lytic or sclerotic bone lesions.  IMPRESSION: 1. Multi focal fluid collections are again noted within the abdomen. When compared with the previous exam these are slightly decreased in size in the interval. No new or enlarging fluid collections identified.  2. Mild increased caliber of the small bowel loops compatible with ileus. No specific features identified to suggest high-grade bowel obstruction.  3. Again noted is extraluminal gas within the soft tissues of the left upper quadrant of the abdomen and extending into the posterior mediastinum. Findings a compatible with known perforated viscus.   Electronically Signed   By: Signa Kell M.D.   On: 10/23/2013 15:35    Anti-infectives: Anti-infectives   Start     Dose/Rate Route Frequency Ordered Stop   10/13/13 1800  fluconazole (DIFLUCAN) IVPB 200 mg     200 mg 100 mL/hr over 60 Minutes  Intravenous Every 24 hours 10/13/13 1707 10/22/13 1906   10/13/13 1715  ertapenem (INVANZ) 1 g in sodium chloride 0.9 % 50 mL IVPB  Status:  Discontinued     1 g 100 mL/hr over 30 Minutes Intravenous Every 24 hours 10/13/13 1707 10/13/13 1750   10/12/13 1600  fluconazole (DIFLUCAN) tablet 100 mg  Status:  Discontinued     100 mg Oral Daily 10/12/13 1428 10/13/13 1707   10/12/13 1600  sulfamethoxazole-trimethoprim (BACTRIM DS) 800-160 MG per tablet 1 tablet  Status:  Discontinued     1 tablet Oral Daily 10/12/13 1428 10/13/13 1707   10/12/13 1600  ertapenem (INVANZ) 1 g in sodium chloride 0.9 % 50 mL IVPB     1 g 100 mL/hr over 30 Minutes Intravenous Every 24 hours 10/12/13 1507         Assessment/Plan Ileus from perf bowel (presumed diverticular) resolving s/p Diagnostic Lap with drain placements POD #11 1.  Abscess collections significantly improved on 10/23/13 CT scan, clinically improved 2.   Recommend ID consult to determine if the patient needs to stay on IV antibiotics at discharge or okay to discharge on PO Augmentin or ceflosporin and flagyl may do just as well per pharmacy.  Also, concern for Invanz lowering seizure threshold so would like their input on medication management at discharge.  May be able to d/c Invanz today?  His seizure activity prevention may need to outweigh the diverticular disease at this point. 3.  Ordered PT/OT due to fall risk and seizure risk to help with ambulation 4.  SCD's, lovenox, encouraged IS 5.  May be able to d/c suprapubic and LLQ drains due to low serous output, but would not remove the LUQ drain site as it is thick tan fluid, would wait until becomes more clear, will leave the drain d/c up to Dr. Michaell Cowing 6.  Recheck CBC tomorrow 7.  Cont. bland diet, may be able to wean TPN at some point, dietitian to discuss diverticular disease diet 8.  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

## 2013-10-24 NOTE — Progress Notes (Signed)
Had a brief episode of junctional rhythm on the monitor, now back to NSR/ST. Patient just had a seizure that lasted less than a minute, no injury noted. Ativan 2mg  IV prn given with effect. Will continue to monitor patient.

## 2013-10-24 NOTE — Consult Note (Signed)
#   [id number] is consult note.  Looks like he may have had another seizure last night. I do not dose of Keppra was given.  We will try increase his steroid dose low but more.  Looking at the CT scan, I really think that the best way to help is to try to have this tumor resected out. It appears to be localized.  I the MRI might help again. I think that he had one done about 6 weeks or so ago.  This is a pretty complex problem. He has the abdominal wound from surgery that has a slow to heal. He has fluid collections that are draining. Increase in steroid dose could further delay is the healing.  Again, let us get MRI. This might be of view is a little more information.  We will follow along.  As always, Mr. Strege and I did have a good prayer session.  Pete E.  Psalm 91:14-16

## 2013-10-24 NOTE — Progress Notes (Signed)
Patient had just another episode of seizure as witnessed by [facility] MD. PRN meds given.

## 2013-10-24 NOTE — Progress Notes (Signed)
Late entry from 10/23/2013 1030. Patient's sister, Talitha Givens, presented to the clinic yesterday to provide an update on her brother's declining condition. Tearfully she explains the patient began to have seizures on Sunday and has had multiple one since. Explained to her that her brother had been seen by a neurologist. Also, that the neurologist had prescribed an increased dose of decadron and keppra. She questioned if it was time to call in hospice and how much longer her brother had to live. Attempted to console her. Expressed that this writer would reach out to Dr. Kathrynn Running and Dr. Colin Benton on her behalf for answers to these questions. Notified bother Dr. Colin Benton and Dr. Kathrynn Running of findings. Both physicians plan to meet with patient.

## 2013-10-24 NOTE — Progress Notes (Signed)
Nutrition Education Note  RD consulted to provide diet education for pt's diverticular condition.   RD provided "Low Fiber Nutrition Therapy" handout from the [employer] ([employer]) and discussed importance following diet until pt fully recovers from diveritculitis. Reviewed list of foods to avoid and suggested types of foods to choose from each food group.  Additionally, provided "5 Sample Menus for Gradually Increasing Fiber" handout from AND and discussed how pt can gradually start incorporating more fiber into his diet. RD name and contact information provided; encouraged pt to call with any questions or concerns.  Pt a little groggy and confused at time of visit but, able to repeat some of diet education and voice some understanding. Pt's sister present for education and voiced understanding. Assisted pt and sister in picking out food items for pt's dinner. Teach back method used.  Expect good compliance.  RD will continue to follow pt's nutrition care plan. Follow-up with calorie count tomorrow.  Ian Malkin RD, LDN Inpatient Clinical Dietitian Pager: [phone] After Hours Pager: [phone]

## 2013-10-24 NOTE — Progress Notes (Signed)
Pt has MRI ordered for today. TNA is currently running at 80 ml/hr.  Pharmacy communicated with RN, IV team and Radiology - plan to decrease TNA rate to 40 ml/hr now.  MRI planned for 3PM.  IV Team will be available to unhook TNA. Plan resume TNA at 1800 tonight with new bag.   Geoffry Paradise, PharmD, BCPS Pager: [phone] 12:39 PM Pharmacy #: [phone]

## 2013-10-24 NOTE — Progress Notes (Signed)
Calorie Count Note  48 hour calorie count ordered.  Diet: Full Liquid (advanced to [place] last night) Supplements: None; pt not interested at this time  Breakfast: 623 kcal, 19 grams protein Lunch: 82 kcal, 4 grams protein Dinner: 349 kcal, 10 grams protein  Total intake: 1054 kcal (46% of minimum estimated needs)  33 protein (30% of minimum estimated needs)  Nutrition Dx: Inadequate oral intake related to inability to eat as evidenced by pt NPO due to perforated colon; ongoing- diet advanced but, inadequate intake continues  Goal: Pt to meet >/= 90% of their estimated nutrition needs; being met with TPN  Per multidisciplinary rounds pt is tolerating bland diet well today; will see if patient is able to increase energy/PO intake today with diet advancement. Pt has a good appetite and is consuming 100% of every item on meal tray.   Intervention:  Continue TPN per Pharmacy Encourage PO intake Provide diet education for diverticular condition   Ian Malkin RD, LDN Inpatient Clinical Dietitian Pager: [phone] After Hours Pager: [phone]

## 2013-10-24 NOTE — Progress Notes (Addendum)
NEURO HOSPITALIST PROGRESS NOTE   SUBJECTIVE:                                                                                                                        Looking in notes it appears patient may have had another brief siezure last night last ing 10 seconds per RN notes.   OBJECTIVE:                                                                                                                           Vital signs in last 24 hours: Temp:  [97.5 F (36.4 C)-98.2 F (36.8 C)] 97.5 F (36.4 C) (11/04 0438) Pulse Rate:  [79-122] 79 (11/04 0438) Resp:  [16-20] 16 (11/04 0438) BP: (129-132)/(81-94) 132/94 mmHg (11/04 0438) SpO2:  [94 %-99 %] 99 % (11/04 0438) Weight:  [88.9 kg (195 lb 15.8 oz)] 88.9 kg (195 lb 15.8 oz) (11/03 1400)  Intake/Output from previous day: 11/03 0701 - 11/04 0700 In: 2644.3 [P.O.:480; I.V.:1125; IV Piggyback:50; TPN:989.3] Out: 1191 [YNWGN:5621; Drains:45] Intake/Output this shift:   Nutritional status: [place]  Past Medical History  Diagnosis Date  . Hyperlipidemia   . Right bundle branch block     Normal LV function normal aortic and mitral valve bedside echocardiogram 4 2012  . Tachycardia     Resolved  . Dyslipidemia   . Coronary artery disease (CAD) excluded     Cardiolite study 2006 within normal limits  . Hypertension   . Allergy   . Cancer       Neurologic Exam:  Mental Status:  Alert, oriented, thought content appropriate. Speech fluent without evidence of aphasia. Able to follow 3 step commands without difficulty.  Cranial Nerves:  II: Visual fields grossly normal, pupils equal, round, reactive to light and accommodation  III,IV, VI: ptosis not present, extra-ocular motions intact bilaterally  V,VII: smile symmetric, facial light touch sensation normal bilaterally  VIII: hearing normal bilaterally  IX,X: gag reflex present  XI: bilateral shoulder shrug  XII: midline tongue extension  without atrophy or fasciculations  Motor:  5/5 throughout Tone and bulk:normal tone throughout; no atrophy noted  Sensory: Pinprick and light touch intact throughout, bilaterally  Deep Tendon Reflexes:  2+ throughout Plantars:  Right: downgoing  Left: downgoing   Lab Results: No results found for this basename: cbc, bmp, coags, chol, tri, ldl, hga1c   Lipid Panel  Recent Labs  10/23/13 0525  TRIG 361*    Studies/Results: Ct Head Wo Contrast  10/22/2013   CLINICAL DATA:  Evaluate for recent seizures. Glioblastoma status post surgery and radiotherapy.  EXAM: CT HEAD WITHOUT CONTRAST  TECHNIQUE: Contiguous axial images were obtained from the base of the skull through the vertex without contrast.  COMPARISON:  Metabolic brain scan 09/26/2013. MRI brain 09/08/13.  FINDINGS: Left parietal hypodensity at the surgical site with moderate surrounding vasogenic edema. No significant midline shift for impending herniation. As seen on image 23 cross-sectional measurements of 26 x 28 mm. There is slightly less mass effect when compared with prior MRI with regard to regional vasogenic edema. There is no superimposed hemorrhage, midline shift, or new intracranial abnormalities. Unremarkable appearing craniotomy defect.  IMPRESSION: Left parietal lesion appears stable to slightly improved with regard to mass effect and surrounding edema. No new abnormalities are detected on this noncontrast exam. 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   Electronically Signed   By: Davonna Belling M.D.   On: 10/22/2013 14:41   Ct Abdomen Pelvis W Contrast  10/23/2013   CLINICAL DATA:  Abdominal pain. Diverticulitis with colonic perforation.  EXAM: CT ABDOMEN AND PELVIS WITH CONTRAST  TECHNIQUE: Multidetector CT imaging of the abdomen and pelvis was performed using the standard protocol following bolus administration of intravenous contrast.  CONTRAST:  OMNIPAQUE IOHEXOL  300 MG/ML  SOLN  COMPARISON:  10/16/2013  FINDINGS: Atelectasis is identified within both lung bases. No focal liver abnormality identified. The gallbladder appears normal. No biliary dilatation. Normal appearance of the pancreas. The spleen is unremarkable.  The adrenal glands are both normal. Normal appearance of the right kidney. Left renal cyst is again identified. Nonobstructing calculus within the inferior pole of the left kidney measures 5 mm, image 51/ series 2. The urinary bladder appears within normal limits. Prostate gland and seminal vesicles are negative.  Normal caliber of the abdominal aorta. No upper abdominal adenopathy noted. There is no pelvic or inguinal adenopathy.  The stomach appears normal. Small bowel loops appear mildly increased in caliber compatible with the clinical history of ileus. Again noted is extra luminal gas within the soft tissues of the left upper quadrant of the abdomen. Gas can be seen tracking around the stomach and into the posterior mediastinum. This appears similar to previous exam.  Multiple fluid collections are again noted within the upper abdomen. Index fluid collection ventral to the duodenum measures 4.1 cm, image 45/ series 2. Previously 3.7 cm. Index fluid collection posterior to the descending colon measures 4.8 cm, image 50/ series 2. This is compared with 5.6 cm previously. Fluid collection within the root of the mesenteric measures 4.1 x 1.9 cm, image 53/ series 2. Previously 5.2 x 2.8 cm. No new fluid collections identified. Similar appearance of percutaneous to drainage catheters within the central portion of the lower abdomen.  Review of the visualized osseous structures is significant for lumbar spondylosis. No aggressive lytic or sclerotic bone lesions.  IMPRESSION: 1. Multi focal fluid collections are again noted within the abdomen. When compared with the previous exam these are slightly decreased in size in the interval. No new or enlarging fluid  collections identified.  2. Mild increased caliber of the small bowel loops compatible with ileus. No specific features identified to suggest high-grade  bowel obstruction.  3. Again noted is extraluminal gas within the soft tissues of the left upper quadrant of the abdomen and extending into the posterior mediastinum. Findings a compatible with known perforated viscus.   Electronically Signed   By: Signa Kell M.D.   On: 10/23/2013 15:35    MEDICATIONS                                                                                                                        Scheduled: . antiseptic oral rinse  15 mL Mouth Rinse q12n4p  . calcium carbonate  1 tablet Oral Daily  . chlorhexidine  15 mL Mouth Rinse BID  . cloNIDine  0.1 mg Transdermal Weekly  . dexamethasone  4 mg Intravenous Q6H  . enoxaparin (LOVENOX) injection  40 mg Subcutaneous Q24H  . ertapenem (INVANZ) IV  1 g Intravenous Q24H  . fentaNYL  25 mcg Transdermal Q72H  . insulin aspart  0-20 Units Subcutaneous Q4H  . levETIRAcetam  1,000 mg Intravenous Q12H  . lip balm  1 application Topical BID  . metoprolol  10 mg Intravenous Q6H  . multivitamin with minerals  1 tablet Oral Daily  . pantoprazole  40 mg Oral BID  . saccharomyces boulardii  250 mg Oral BID  . sodium chloride  10-40 mL Intracatheter Q12H    ASSESSMENT/PLAN:                                                                                                             Seizure: Patient possibly had another seizure last night.  He currently is on 1000 mg BID Keppra.  Will increase to 1250 mg BID and continue to follow.   Assessment and plan discussed with with attending physician and they are in agreement.    Felicie Morn PA-C [facility] [phone]  10/24/2013, 9:37 AM   I have seen and evaluated the patient. Difficulty repeating, mild difficulty with naming. Agree with increase in steriods to 4 q6h given continued seizures and mild aphasia. Will  also increase keppra to 1250mg  BID. Appreciate oncology assistance.   Ritta Slot, MD [facility] [phone]  If 7pm- 7am, please page neurology on call at [phone].

## 2013-10-24 NOTE — Progress Notes (Signed)
Patient tolerating bland diet, ate 100% dinner. No complaints of any n/v.

## 2013-10-24 NOTE — Progress Notes (Signed)
Had one episode of few seconds seizure when patient was unresponsive for about less than 10 sec with his eyeballs rolled up wards, no jerking movements. Patient regained consciousness right away. Will continue to monitor.

## 2013-10-24 NOTE — Progress Notes (Signed)
ANTIBIOTIC CONSULT NOTE - INITIAL  Pharmacy Consult for Zosyn Indication: peritoneum - perf diverticulitis  Allergies  Allergen Reactions  . Dilaudid [Hydromorphone Hcl] Nausea And Vomiting  . Morphine And Related Nausea And Vomiting    Patient Measurements: Height: 6' (182.9 cm) Weight: 195 lb 15.8 oz (88.9 kg) IBW/kg (Calculated) : 77.6  Labs:  Recent Labs  10/22/13 0530 10/23/13 0525 10/24/13 0605  WBC 14.7* 14.2*  --   HGB 11.4* 11.1*  --   PLT 261 257  --   CREATININE 0.55 0.53 0.51    Assessment: 57 yo M admitted 10/23 with abdominal pain, found to have diverticulitis of colon with perforation. S/p diagnostic laparoscopy on 10/24 with washout but perforation was not identified, 3 JP drains were placed. Pt has been on ertapenem since admission, now with recurrent seizures, MD changing ertapenem to Zosyn and will consult ID for further recommendations. Abscess collection slightly improved on CT 11/3.   10/23 >> Ertapenem >> 11/4 10/23 >> Fluconazole >> 11/2 10/23 >> Septra DS >> 10/24 11/4 >> Zosyn >>   Tmax: afeb WBCs: stable at 14.2K Renal: Scr 0.51, CG > 100 ml/min  10/24 mrsa pcr >> negative 10/24 abd fluid >> E.coli (only R to amp, cipro, augmentin) 10/31 urine >> NGF  Plan:   Zosyn 3.375 gm IV q8h extended infusion over 4 hours  Pharmacy will f/u  Geoffry Paradise, PharmD, BCPS Pager: [phone] 11:22 AM Pharmacy #: [id number]

## 2013-10-24 NOTE — Progress Notes (Signed)
PARENTERAL NUTRITION CONSULT NOTE - Follow up  Pharmacy Consult for TPN Indication: Prolonged Ileus  Allergies  Allergen Reactions  . Dilaudid [Hydromorphone Hcl] Nausea And Vomiting  . Morphine And Related Nausea And Vomiting    Patient Measurements: Height: 6' (182.9 cm) Weight: 195 lb 15.8 oz (88.9 kg) IBW/kg (Calculated) : 77.6  Labs:  Recent Labs  10/22/13 0530 10/23/13 0525  WBC 14.7* 14.2*  HGB 11.4* 11.1*  HCT 34.2* 33.2*  PLT 261 257     Recent Labs  10/22/13 0530 10/23/13 0525 10/24/13 0605  NA 134* 136 136  K 3.7 3.8 3.9  CL 98 99 99  CO2 26 26 27   GLUCOSE 164* 141* 141*  BUN 15 17 15   CREATININE 0.55 0.53 0.51  CALCIUM 9.0 8.7 9.0  MG  --  1.8  --   PHOS  --  3.6  --   PROT  --  5.5*  --   ALBUMIN  --  2.3*  --   AST  --  30  --   ALT  --  66*  --   ALKPHOS  --  122*  --   BILITOT  --  0.2*  --   PREALBUMIN  --  30.8  --   TRIG  --  361*  --    Estimated Creatinine Clearance: 111.8 ml/min (by C-G formula based on Cr of 0.51).    Recent Labs  10/24/13 0341 10/24/13 0434 10/24/13 0738  GLUCAP 152* 147* 121*   CBGs & Insulin requirements past 24 hours:  - CBGs 109-173, required 21 units SSI  Nutritional Goals:  - RD recs 11/3: 2300-2500 Kcal, 110-125 g protein, 2.3-2.5 L/day.  - See calorie count below on current diet  - Clinimix E 5/20 @ 60 ml/hr will provide 72 g protein, 1267 Kcal daily. IVFE on hold given elevated triglycerides (acceptable to give once weekly, last received 11/3)    Current nutrition:  - Diet: Full liquid diet starting 11/1   11/2 - 11/3: Full liquid diet calorie count by RD gives a total daily intake of 1391 Kcal, 43 g proteins  11/3 - 11/4: Advanced to bland diet - calorie count by RD gives a total of daily intake of 1050 kcal, 33g proteins.  - TNA: Clinimix E 5/20 @ 80 ml/hr, IV fat emulsion prn as TGs are elevated - mIVF: 0.9% NaCl with 20 mEq KCl at 45 ml/hr   Assessment:  57 yo M admitted 10/23 with  abdominal pain, found to have diverticulitis of colon with perforation.  S/p diagnostic laparoscopy on 10/24 with washout but perforation was not identified, 3 JP drains were placed.  Pt now with post op ileus, NGT to LIS, minimal BS, abdominal distention.  Pharmacy is consulted to start TPN for prolonged Ileus 10/27  11/4, today is TNA day#9. Diet advanced to [facility] Diet - pt is tolerating well, consuming 100% of items on meal.  RD is performing calorie count x 48h to accurate supplement diet with TNA to meet >90% of total nutritional needs.   Labs: Electrolytes: lytes wnl Renal Function: Scr stable/wnl. Good UOP Hepatic Function: alk phos trending up (122) Pre-Albumin: 18.7 (10/28), 19.4 (10/24) Triglycerides: 404 (10/28), 344 (10/29), 293 (10/31), elevated to 361 (11/3). Above goal of TG < 150 after first day of IV lipids. CBGs: mostly controlled on SSI  Plan:  At 1800 tonight   Change Clinimix E 5/20 to 60 ml/hr.   Given elevated triglycerides - will not give IVFE.  Acceptable to give lipids at least qweekly (with Trigs <500) to prevent FA deficiency.   Standard multivitamins and trace elements via po multivitamin tablet - pt tolerating  Continue CBGs and SSI q4h  TNA labs Monday/Thursdays  Pharmacy will follow up daily  Geoffry Paradise, PharmD, BCPS Pager: [phone] 8:51 AM Pharmacy #: [phone]

## 2013-10-24 NOTE — Progress Notes (Signed)
Switch from etrapenum to Zosyn given seizures.  I agree w rec of ID consult to adjust IV ABx, length of Tx etc I had d/w Neurology last night.  If inc steroids needed to control seizures & neuro issues, so be it.  That seems to be the more pressing issue for now.

## 2013-10-25 DIAGNOSIS — A498 Other bacterial infections of unspecified site: Secondary | ICD-10-CM

## 2013-10-25 DIAGNOSIS — K651 Peritoneal abscess: Secondary | ICD-10-CM

## 2013-10-25 DIAGNOSIS — K560 Paralytic ileus: Secondary | ICD-10-CM

## 2013-10-25 DIAGNOSIS — E43 Unspecified severe protein-calorie malnutrition: Secondary | ICD-10-CM

## 2013-10-25 LAB — BASIC METABOLIC PANEL
CO2: 25 mEq/L (ref 19–32)
Calcium: 9.1 mg/dL (ref 8.4–10.5)
GFR calc non Af Amer: >90 mL/min (ref >90)
Potassium: 4.4 mEq/L (ref 3.5–5.1)
Sodium: 133 mEq/L — ABNORMAL LOW (ref 135–145)

## 2013-10-25 LAB — CBC
HCT: 34.8 % — ABNORMAL LOW (ref 39.0–52.0)
MCV: 98.6 fL (ref 78.0–100.0)
Platelets: 266 10*3/uL (ref 150–400)
RBC: 3.53 MIL/uL — ABNORMAL LOW (ref 4.22–5.81)
WBC: 14.5 10*3/uL — ABNORMAL HIGH (ref 4.0–10.5)

## 2013-10-25 LAB — GLUCOSE, CAPILLARY
Glucose-Capillary: 164 mg/dL — ABNORMAL HIGH (ref 70–99)
Glucose-Capillary: 178 mg/dL — ABNORMAL HIGH (ref 70–99)

## 2013-10-25 MED ORDER — SODIUM CHLORIDE 0.9 % IV SOLN
100.0000 mg | Freq: Two times a day (BID) | INTRAVENOUS | Status: DC
  Filled 2013-10-25: qty 10

## 2013-10-25 MED ORDER — ENSURE COMPLETE PO LIQD: 237.0000 mL | Freq: Two times a day (BID) | ORAL | Status: DC

## 2013-10-25 MED ORDER — SODIUM CHLORIDE 0.9 % IV SOLN
100.0000 mg | Freq: Two times a day (BID) | INTRAVENOUS | Status: DC
  Administered 2013-10-26: 100 mg via INTRAVENOUS
  Filled 2013-10-25 (×2): qty 10

## 2013-10-25 MED ORDER — SODIUM CHLORIDE 0.9 % IV SOLN
200.0000 mg | INTRAVENOUS | Status: AC
  Administered 2013-10-25: 200 mg via INTRAVENOUS
  Filled 2013-10-25: qty 20

## 2013-10-25 MED ORDER — DEXTROSE 5 % IV SOLN
2.0000 g | INTRAVENOUS | Status: DC
  Administered 2013-10-25 – 2013-11-01 (×8): 2 g via INTRAVENOUS
  Filled 2013-10-25 (×9): qty 2

## 2013-10-25 MED ORDER — CLINIMIX E/DEXTROSE (5/15) 5 % IV SOLN
INTRAVENOUS | Status: AC
  Administered 2013-10-25: 18:00:00 via INTRAVENOUS
  Filled 2013-10-25: qty 1000

## 2013-10-25 MED ORDER — SODIUM CHLORIDE 0.9 % IV SOLN
1500.0000 mg | Freq: Two times a day (BID) | INTRAVENOUS | Status: DC
  Administered 2013-10-25 – 2013-11-02 (×16): 1500 mg via INTRAVENOUS
  Filled 2013-10-25 (×20): qty 15

## 2013-10-25 MED ORDER — METRONIDAZOLE IN NACL 5-0.79 MG/ML-% IV SOLN
500.0000 mg | Freq: Three times a day (TID) | INTRAVENOUS | Status: DC
  Administered 2013-10-25 – 2013-10-26 (×2): 500 mg via INTRAVENOUS
  Filled 2013-10-25 (×3): qty 100

## 2013-10-25 NOTE — Progress Notes (Signed)
Zenovia Jarred, PT, DPT 10/25/2013 Pager: [phone]

## 2013-10-25 NOTE — Progress Notes (Signed)
Calorie Count Note  48 hour calorie count ordered and now complete  Diet: B[place] Supplements: none  Breakfast: 533 kcal, 16 grams protein Lunch: 662 kcal, 25 grams Dinner: 488 kcal, 18 grams Supplements: none  Note: Pt ordered full liquids for breakfast. Pt missed lunch due to being NPO for procedure; pt ate a very late lunch and only ate a sandwich with juice for dinner.   Total intake: 1683 kcal (73% of minimum estimated needs)  59 grams protein (54% of minimum estimated needs)  Pt continues to have a good appetite and is eating 100% of most meals. Energy/protein intake is improving and likely will improve further with full day of regular diet. Current TPN, Clinamix E 5/20 @ 60 ml/hr, provides 1267 kcal and 72 grams protein. Pt exceeding estimated energy and protein needs with TPN and PO intake; not meeting estimated needs with PO intake alone. Pt with increased energy/protein needs due to medical condition; will provide snacks and Ensure Complete to help pt meet these needs through PO intake alone. Per chart, may be able to wean TPN at some point.  Per multidisciplinary rounds pt's bowel function is improving, pt has three seizures this AM.    Nutrition Dx: Inadequate oral intake related to inability to eat as evidenced by pt NPO due to perforated colon; ongoing- diet advanced but, inadequate oral intake continues  Goal: Pt to meet >/= 90% of their estimated nutrition needs; currently being met  Intervention:  TPN wean per Pharmacy Provide snack once daily Provide Ensure Complete BID   Ian Malkin RD, LDN Inpatient Clinical Dietitian Pager: [phone] After Hours Pager: [phone]

## 2013-10-25 NOTE — Progress Notes (Signed)
Patient continues to have seizure activity intermittently throughout the day. RN has witnessed 5. 4  have lasted less then 30 seconds and 1 earlier this morning lasting for about a minute and a half. RN notified neurology team. Keppra was increased and vimpate was added. Patient currently resting, RN will continue to monitor.

## 2013-10-25 NOTE — Evaluation (Addendum)
Occupational Therapy Evaluation Patient Details Name: ELBERT SPICKLER MRN: [id number] DOB: 07/23/1956 Today's Date: 10/25/2013 Time: 8469-6295 OT Time Calculation (min): 29 min  OT Assessment / Plan / Recommendation History of present illness Pt admitted w/ dx: Diverticulitis of colon w/ perforation, abdominal pain. Pt w/ h/o Glioblastoma left parietal brain, status post biopsy/debulking in April 2014 followed by radiation and adjuvant chemotherapy consisting of Temodar, currently being managed with Avastin every 2 weeks. He subsequently was started on steroids secondary to progression of disease with right-sided weakness in September 2014. As of 10/24/13, RN staff, pt/family are reporting seizures as well.   Clinical Impression   Pt presents w/ dx as above, now complicated by seizures. He presents w/ deficits affecting independence in areas of ADL's and self care (see OT problem list below). He has deficits in areas of motor planning, apraxia, motor planning, fine motor control & denies need for assist, decreased awareness of deficits. He will benefit from acute OT followed by SNF Rehab vs 24hr/Min A if d/c home.    OT Assessment  Patient needs continued OT Services    Follow Up Recommendations  Home health OT;Supervision/Assistance - 24 hour;SNF    Barriers to Discharge      Equipment Recommendations  None recommended by OT    Recommendations for Other Services    Frequency  Min 2X/week    Precautions / Restrictions Precautions Precautions: Fall;Other (comment) (Seizures) Precaution Comments: Seizure Restrictions Weight Bearing Restrictions: No   Pertinent Vitals/Pain No c/o, denies pain    ADL  Eating/Feeding: Performed;Set up (Fine motor control impaired bilaterally) Where Assessed - Eating/Feeding: Chair Grooming: Performed;Wash/dry hands;Wash/dry face;Set up Upper Body Bathing: Simulated;Set up;Min guard Where Assessed - Upper Body Bathing: Supported sitting Lower Body  Bathing: Simulated;Minimal assistance Where Assessed - Lower Body Bathing: Supported sit to stand Upper Body Dressing: Performed;Minimal assistance Where Assessed - Upper Body Dressing: Supported sitting;Supported sit to stand Lower Body Dressing: Simulated;Moderate assistance Where Assessed - Lower Body Dressing: Supported sit to stand Toilet Transfer: Simulated;Minimal assistance (Transfer EOB to chair) Toilet Transfer Method: Sit to Barista: Bedside commode;Other (comment) (Hand held assist & VC's for sequencing, planning) Toileting - Clothing Manipulation and Hygiene: Simulated;Minimal assistance Where Assessed - Toileting Clothing Manipulation and Hygiene: Sit to stand from 3-in-1 or toilet Tub/Shower Transfer Method: Not assessed Equipment Used: Gait belt Transfers/Ambulation Related to ADLs: Min assist transfer from EOB to chair ADL Comments: Note: pt had 2 seizures during OT assessment today and RN aware. Pt able to state "Here it comes" as he reports having an aura prior to seizure that lasts less than 30 seconds. RN made aware. Pt has significant deficits in fine motor control/coordination, intention tremors, apraxia noted as well. Pt denies need for assist, however was noted to eat pancakes/eggs w/ hands after initial attempt to eat w/ knife. Pt expresses frustration when asked if he needs assist.    OT Diagnosis: Generalized weakness;Disturbance of vision;Apraxia  OT Problem List: Decreased activity tolerance;Impaired balance (sitting and/or standing);Impaired UE functional use;Decreased knowledge of precautions;Decreased knowledge of use of DME or AE;Decreased safety awareness;Decreased coordination;Impaired vision/perception OT Treatment Interventions: Self-care/ADL training;Therapeutic exercise;Neuromuscular education;Energy conservation;DME and/or AE instruction;Patient/family education;Visual/perceptual remediation/compensation;Cognitive  remediation/compensation;Therapeutic activities;Balance training   OT Goals(Current goals can be found in the care plan section) Acute Rehab OT Goals Patient Stated Goal: Go home, get well OT Goal Formulation: With patient Time For Goal Achievement: 11/08/13 Potential to Achieve Goals: Fair  Visit Information  Last OT Received On: 10/25/13 Assistance  Needed: +1 History of Present Illness: Pt admitted w/ dx: Diverticulitis of colon w/ perforation, abdominal pain. Pt w/ h/o Glioblastoma left frontal lobe w/ right sided weakness. As of 10/24/13, RN staff, pt/family are reporting seizures as well.       Prior Functioning     Home Living Family/patient expects to be discharged to:: Private residence Living Arrangements: Spouse/significant other Available Help at Discharge: Friend(s) Type of Home: House Home Access: Stairs to enter [employer] of Steps: 2 STE Entrance Stairs-Rails: None Home Layout: One level;Other (Comment) (Basement w/ laundry) Home Equipment: Grab bars - tub/shower Prior Function Level of Independence: Independent with assistive device(s) Communication Communication: Expressive difficulties Dominant Hand: Right    Vision/Perception Vision - History Baseline Vision: Wears glasses all the time Patient Visual Report: Other (comment);Blurring of vision (Watery)   Cognition  Cognition Arousal/Alertness: Awake/alert Behavior During Therapy: WFL for tasks assessed/performed Overall Cognitive Status: Within Functional Limits for tasks assessed    Extremity/Trunk Assessment Upper Extremity Assessment Upper Extremity Assessment: RUE deficits/detail;LUE deficits/detail RUE Sensation: decreased proprioception RUE Coordination: decreased fine motor LUE Sensation: decreased proprioception LUE Coordination: decreased fine motor Lower Extremity Assessment Lower Extremity Assessment: Defer to PT evaluation Cervical / Trunk Assessment Cervical / Trunk  Assessment: Normal    Mobility Bed Mobility Bed Mobility: Supine to Sit;Sitting - Scoot to Edge of Bed Supine to Sit: 4: Min assist;HOB elevated;With rails Sitting - Scoot to Edge of Bed: 3: Mod assist;With rail Details for Bed Mobility Assistance: VC's for motor planning and sequencing to scoot to EOB prior to standing. Somewhat implusive at times. Transfers Transfers: Sit to Stand;Stand to Sit Sit to Stand: 4: Min assist;With upper extremity assist;From bed;With armrests Stand to Sit: 4: Min assist;With upper extremity assist;With armrests;To chair/3-in-1 Details for Transfer Assistance: VC's/TC's for safety, sequencing, hand placement  and motor planning        Balance Balance Balance Assessed: Yes Static Sitting Balance Static Sitting - Balance Support: Feet supported;Right upper extremity supported Static Standing Balance Static Standing - Balance Support: Left upper extremity supported   End of Session OT - End of Session Equipment Utilized During Treatment: Gait belt Activity Tolerance: Patient tolerated treatment well;Treatment limited secondary to medical complications (Comment);Other (comment) (Seizures x2 during OT assessment, RN aware) Patient left: in chair;with call bell/phone within reach;with chair alarm set;with nursing/sitter in room;with family/visitor present Nurse Communication: Mobility status;Other (comment) (Decreased fine motor control, implusiveness)  GO     Charletta Cousin, Amy Beth Dixon 10/25/2013, 9:15 AM

## 2013-10-25 NOTE — Progress Notes (Signed)
PT Cancellation Note  Patient Details Name: Martin Martin MRN: [id number] DOB: [birth year]   Cancelled Treatment:    Reason Eval/Treat Not Completed: Medical issues which prohibited therapy. Per pt's wife, pt unable to participate in PT this morning (11:30am) as pt had a "big" seizure 45 minutes prior to PT entering room and pt was given medication to allow rest after seizure. PT will check back as schedule permits.   Sol Blazing 10/25/2013, 1:14 PM

## 2013-10-25 NOTE — Progress Notes (Addendum)
PARENTERAL NUTRITION CONSULT NOTE - Follow up  Pharmacy Consult for TPN Indication: Prolonged Ileus  Allergies  Allergen Reactions  . Dilaudid [Hydromorphone Hcl] Nausea And Vomiting  . Morphine And Related Nausea And Vomiting    Patient Measurements: Height: 6' (182.9 cm) Weight: 195 lb 15.8 oz (88.9 kg) IBW/kg (Calculated) : 77.6  Labs:  Recent Labs  10/23/13 0525 10/25/13 0510  WBC 14.2* 14.5*  HGB 11.1* 11.6*  HCT 33.2* 34.8*  PLT 257 266     Recent Labs  10/23/13 0525 10/24/13 0605 10/25/13 0510  NA 136 136 133*  K 3.8 3.9 4.4  CL 99 99 96  CO2 26 27 25   GLUCOSE 141* 141* 174*  BUN 17 15 19   CREATININE 0.53 0.51 0.65  CALCIUM 8.7 9.0 9.1  MG 1.8  --   --   PHOS 3.6  --   --   PROT 5.5*  --   --   ALBUMIN 2.3*  --   --   AST 30  --   --   ALT 66*  --   --   ALKPHOS 122*  --   --   BILITOT 0.2*  --   --   PREALBUMIN 30.8  --   --   TRIG 361*  --   --    Estimated Creatinine Clearance: 111.8 ml/min (by C-G formula based on Cr of 0.65).    Recent Labs  10/24/13 2354 10/25/13 0313 10/25/13 0735  GLUCAP 162* 184* 151*   CBGs & Insulin requirements past 24 hours:  - CBGs 121-184, required 25 units SSI  Nutritional Goals:  - RD recs 11/3: 2300-2500 Kcal, 110-125 g protein, 2.3-2.5 L/day.  - See calorie count below on current diet  - Clinimix E 5/15 @ 40 ml/hr will provide 48 g protein, 682 Kcal daily. IVFE on hold given elevated triglycerides (acceptable to give once weekly, last received 11/3)    Current nutrition:  - Diet: Bland diet starting 11/3   11/2-11/3: Full liquid diet calorie count by RD gives a total daily intake of 1391 Kcal, 43 g proteins  11/3-11/4: Advanced to bland diet - calorie count by RD gives a total of daily intake of 1050 kcal, 33g proteins.   11/4-11/5Parke Simmers diet - 1683 Kcal, 59 g protein with potential for improvement.  - TNA: Clinimix E 5/20 @ 60 ml/hr, IV fat emulsion prn as TGs are elevated - mIVF: 0.9% NaCl  with 20 mEq KCl at 45 ml/hr   Assessment:  57 yo M admitted 10/23 with abdominal pain, found to have diverticulitis of colon with perforation.  S/p diagnostic laparoscopy on 10/24 with washout but perforation was not identified, 3 JP drains were placed.  Pt now with post op ileus, NGT to LIS, minimal BS, abdominal distention.  Pharmacy is consulted to start TPN for prolonged Ileus 10/27  11/5, today is TNA Day#10. Diet advanced to [facility] Diet and pt is tolerating 100% of items on meal with potential to increase PO intake. RD completing calorie count to accurately supplement diet with TNA to meet >90% of total nutritional needs.  RD ordered for snack and Ensure Complete BID.   Labs: Electrolytes: Na+ 133 (unable to adjust in TNA), other lytes wnl Renal Function: Scr stable/wnl. Good UOP Hepatic Function: alk phos trending up (122) Pre-Albumin: 18.7 (10/28), 19.4 (10/24) Triglycerides: 404 (10/28), 344 (10/29), 293 (10/31), elevated to 361 (11/3). Above goal of TG < 150 after first day of IV lipids. CBGs: elevated on  resistant SSI - will monitor closely  Plan:    Change TNA to Clinimix E 5/15 @ 40 ml/hr. Expect further improvement in diet/PO intake.    Given elevated triglycerides - will not give IVFE. Acceptable to give lipids at least qweekly (with Trigs <500) to prevent FA deficiency.   Monitor CBGss - will not add insulin to TNA yet as TNA rate and formulation is changing (to receive less dextrose)  Standard multivitamins and trace elements via po multivitamin tablet - pt tolerating  Continue CBGs and SSI q4h  TNA labs Monday/Thursdays  F/u when able to wean TNA  Geoffry Paradise, PharmD, BCPS Pager: [phone] 11:28 AM Pharmacy #: [id number]

## 2013-10-25 NOTE — Consult Note (Addendum)
[facility]  Total days of antibiotics 14        Day 2 piptazo        Day 12 erta (d/c'd 11/3)               Reason for Consult:Ileus from perf bowel (presumed diverticular) resolving s/p Diagnostic Lap with drain placements POD #11      Referring Physician: lama  Principal Problem:   Diverticulitis of colon with perforation Active Problems:   Right sided weakness   Hyperglycemia   Thrush    HPI: Martin Martin is a 57 y.o. male  with recent diagnosis of glioblastoma of the left parietal brain in April 2014, status post biopsy/debulking, followed by radiation and adjuvant chemotherapy consisting of Temodar, currently being managed with Avastin every 2 weeks. He subsequently was started on steroids secondary to progression of disease with right-sided weakness in September 2014. The patient presented to the ED roughly 2 wks ago with a 2-3 day history of abdominal pain and abdominal bloating. Has not had a BM in 2 days despite treatment with magnesium citrate and only minimal results with a Fleets enema. He was found to have ileus/and diverticulitis c/b intra-abdominal abscess. He was placed on ertapenem and underwent dx lap with 3 drain placements to purulent material. Cultures grew out ecoli (R amp, R tmp/smx).He continued on ertapenem for roughly 12 days and recently changed to piptazo due to having increased seizures. He remains afebrile, but maintains to have daily seizures ranging from < 1 min to 1.16min long involving right sided arm or leg rhythmic jerking and eyes rolling back in addition to post ictal state. Neurology has been titrating his anti-epileptics. He did enjoy having a sandwhich yesterday however, no BM in the last 2 days. Decrease flatus with abdominal distention.   Repeat abdominal ct on 11/3 shows The stomach appears normal. Small bowel loops appear mildly increased in caliber compatible with the clinical history of ileus. Again noted is extra  luminal gas within the soft tissues of the left upper quadrant of the abdomen. Gas can be seen tracking around the  stomach and into the posterior mediastinum. This appears similar to previous exam. Multiple fluid collections are again noted within the upper abdomen. Index fluid collection ventral to the duodenum measures 4.1 cm, image 45/ series 2. Previously 3.7 cm. Index fluid collection posterior to the descending colon measures 4.8 cm, image 50/ series  2. This is compared with 5.6 cm previously. Fluid collection within the root of the mesenteric measures 4.1 x 1.9 cm, image 53/ series  2. Previously 5.2 x 2.8 cm. No new fluid collections identified. Similar appearance of percutaneous to drainage catheters within the central portion of the lower abdomen.    Past Medical History  Diagnosis Date  . Hyperlipidemia   . Right bundle branch block     Normal LV function normal aortic and mitral valve bedside echocardiogram 4 2012  . Tachycardia     Resolved  . Dyslipidemia   . Coronary artery disease (CAD) excluded     Cardiolite study 2006 within normal limits  . Hypertension   . Allergy   . Cancer     Allergies:  Allergies  Allergen Reactions  . Dilaudid [Hydromorphone Hcl] Nausea And Vomiting  . Morphine And Related Nausea And Vomiting    MEDICATIONS: . antiseptic oral rinse  15 mL Mouth Rinse q12n4p  . calcium carbonate  1 tablet Oral Daily  . chlorhexidine  15 mL Mouth Rinse BID  . cloNIDine  0.1 mg Transdermal Weekly  . dexamethasone  4 mg Intravenous Q6H  . enoxaparin (LOVENOX) injection  40 mg Subcutaneous Q24H  . feeding supplement (ENSURE COMPLETE)  237 mL Oral BID BM  . fentaNYL  25 mcg Transdermal Q72H  . insulin aspart  0-20 Units Subcutaneous Q4H  . [START ON 10/26/2013] lacosamide (VIMPAT) IV  100 mg Intravenous Q12H  . lacosamide (VIMPAT) IV  200 mg Intravenous STAT  . levETIRAcetam  1,500 mg Intravenous Q12H  . lip balm  1 application Topical BID  . metoprolol   10 mg Intravenous Q6H  . multivitamin with minerals  1 tablet Oral Daily  . pantoprazole  40 mg Oral BID  . piperacillin-tazobactam (ZOSYN)  IV  3.375 g Intravenous Q8H  . polyethylene glycol  17 g Oral Daily  . saccharomyces boulardii  250 mg Oral BID  . sodium chloride  10-40 mL Intracatheter Q12H    History  Substance Use Topics  . Smoking status: Former Smoker -- 1.00 packs/day for 30 years    Types: Cigarettes    Quit date: 03/21/2005  . Smokeless tobacco: Never Used  . Alcohol Use: Yes     Comment: Occasional Brandy    Family History  Problem Relation Age of Onset  . Diverticulitis Father   . Alzheimer's disease Mother      Review of Systems:  Constitutional: No fever, no chills; Appetite normal; No weight loss, + 30 pound weight gain in past few months, no fatigue. HEENT: No blurry vision, no diplopia, no pharyngitis, no dysphagia CV: No chest pain, no palpitations, no PND. Resp: + SOB, no cough, no pleuritic pain. GI: No nausea, no vomiting, no diarrhea, no melena, no hematochezia, + constipation. GU: No dysuria, no hematuria, no frequency, no urgency. MSK: no myalgias, no arthralgias. Neuro: No headache, + right sided weakness, + history of focal seizure, on Keppra. Psych: No depression, no anxiety. Endo: No heat intolerance, no cold intolerance, no polyuria, no polydipsia Skin: No rashes, no skin lesions. Heme: + easy bruising. Travel history: None in past 6 months.   OBJECTIVE: Temp:  [97.5 F (36.4 C)-98 F (36.7 C)] 97.6 F (36.4 C) (11/05 1540) Pulse Rate:  [104-116] 116 (11/05 1540) Resp:  [18-20] 20 (11/05 1540) BP: (121-137)/(80-89) 121/80 mmHg (11/05 1540) SpO2:  [97 %-99 %] 99 % (11/05 1540) Physical Exam  Constitutional:  oriented to person, place, and time. He appears well-developed and well-nourished. No distress. Sleepy. Left occipital area shaved HENT:  Mouth/Throat: Oropharynx is clear and moist. No oropharyngeal exudate.  Cardiovascular: Normal  rate, regular rhythm and normal heart sounds. Exam reveals no gallop and no friction rub.  No murmur heard.  Pulmonary/Chest: Effort normal and breath sounds normal. No respiratory distress. He has no wheezes.  Abdominal: protuberant abdomen, distended, nontender, 3 drains in LLQ, 1st drain purulent white matter. 2nd drain serous, 3rd drain purulent mattter. Hypoactive bowel sounds Lymphadenopathy:  He has no cervical adenopathy.  Neurological: He is alert and oriented to person, place, and time.  Skin: Skin is warm and dry. Diffuse echymosis to abdomen and arms.   LABS: Results for orders placed during the hospital encounter of 10/12/13 (from the past 48 hour(s))  GLUCOSE, CAPILLARY     Status: Abnormal   Collection Time    10/23/13  5:32 PM      Result Value Range   Glucose-Capillary 173 (*) 70 - 99 mg/dL   Comment 1 Documented  in Chart     Comment 2 Notify RN    GLUCOSE, CAPILLARY     Status: Abnormal   Collection Time    10/23/13  8:13 PM      Result Value Range   Glucose-Capillary 137 (*) 70 - 99 mg/dL  GLUCOSE, CAPILLARY     Status: Abnormal   Collection Time    10/24/13 12:45 AM      Result Value Range   Glucose-Capillary 160 (*) 70 - 99 mg/dL  GLUCOSE, CAPILLARY     Status: Abnormal   Collection Time    10/24/13  3:41 AM      Result Value Range   Glucose-Capillary 152 (*) 70 - 99 mg/dL  GLUCOSE, CAPILLARY     Status: Abnormal   Collection Time    10/24/13  4:34 AM      Result Value Range   Glucose-Capillary 147 (*) 70 - 99 mg/dL  BASIC METABOLIC PANEL     Status: Abnormal   Collection Time    10/24/13  6:05 AM      Result Value Range   Sodium 136  135 - 145 mEq/L   Potassium 3.9  3.5 - 5.1 mEq/L   Chloride 99  96 - 112 mEq/L   CO2 27  19 - 32 mEq/L   Glucose, Bld 141 (*) 70 - 99 mg/dL   BUN 15  6 - 23 mg/dL   Creatinine, Ser 1.61  0.50 - 1.35 mg/dL   Calcium 9.0  8.4 - 09.6 mg/dL   GFR calc non Af Amer >90  >90 mL/min   GFR calc Af Amer >90  >90 mL/min    Comment: (NOTE)     The eGFR has been calculated using the CKD EPI equation.     This calculation has not been validated in all clinical situations.     eGFR's persistently <90 mL/min signify possible Chronic Kidney     Disease.  GLUCOSE, CAPILLARY     Status: Abnormal   Collection Time    10/24/13  7:38 AM      Result Value Range   Glucose-Capillary 121 (*) 70 - 99 mg/dL   Comment 1 Notify RN     Comment 2 Documented in Chart    GLUCOSE, CAPILLARY     Status: Abnormal   Collection Time    10/24/13 11:43 AM      Result Value Range   Glucose-Capillary 128 (*) 70 - 99 mg/dL   Comment 1 Notify RN     Comment 2 Documented in Chart    GLUCOSE, CAPILLARY     Status: Abnormal   Collection Time    10/24/13  4:46 PM      Result Value Range   Glucose-Capillary 126 (*) 70 - 99 mg/dL  GLUCOSE, CAPILLARY     Status: Abnormal   Collection Time    10/24/13  8:36 PM      Result Value Range   Glucose-Capillary 193 (*) 70 - 99 mg/dL  GLUCOSE, CAPILLARY     Status: Abnormal   Collection Time    10/24/13 11:54 PM      Result Value Range   Glucose-Capillary 162 (*) 70 - 99 mg/dL   Comment 1 Documented in Chart     Comment 2 Notify RN    GLUCOSE, CAPILLARY     Status: Abnormal   Collection Time    10/25/13  3:13 AM      Result Value Range   Glucose-Capillary 184 (*)  70 - 99 mg/dL   Comment 1 Documented in Chart     Comment 2 Notify RN    BASIC METABOLIC PANEL     Status: Abnormal   Collection Time    10/25/13  5:10 AM      Result Value Range   Sodium 133 (*) 135 - 145 mEq/L   Potassium 4.4  3.5 - 5.1 mEq/L   Chloride 96  96 - 112 mEq/L   CO2 25  19 - 32 mEq/L   Glucose, Bld 174 (*) 70 - 99 mg/dL   BUN 19  6 - 23 mg/dL   Creatinine, Ser 1.19  0.50 - 1.35 mg/dL   Calcium 9.1  8.4 - 14.7 mg/dL   GFR calc non Af Amer >90  >90 mL/min   GFR calc Af Amer >90  >90 mL/min   Comment: (NOTE)     The eGFR has been calculated using the CKD EPI equation.     This calculation has not been  validated in all clinical situations.     eGFR's persistently <90 mL/min signify possible Chronic Kidney     Disease.  CBC     Status: Abnormal   Collection Time    10/25/13  5:10 AM      Result Value Range   WBC 14.5 (*) 4.0 - 10.5 K/uL   RBC 3.53 (*) 4.22 - 5.81 MIL/uL   Hemoglobin 11.6 (*) 13.0 - 17.0 g/dL   HCT 82.9 (*) 56.2 - 13.0 %   MCV 98.6  78.0 - 100.0 fL   MCH 32.9  26.0 - 34.0 pg   MCHC 33.3  30.0 - 36.0 g/dL   RDW 86.5 (*) 78.4 - 69.6 %   Platelets 266  150 - 400 K/uL  GLUCOSE, CAPILLARY     Status: Abnormal   Collection Time    10/25/13  7:35 AM      Result Value Range   Glucose-Capillary 151 (*) 70 - 99 mg/dL  GLUCOSE, CAPILLARY     Status: Abnormal   Collection Time    10/25/13 11:57 AM      Result Value Range   Glucose-Capillary 200 (*) 70 - 99 mg/dL    MICRO: 29/52 body fluid: ecoli (R amp, R tmp/smx, all else is S)  IMAGING: Mr Laqueta Jean Wo Contrast  10/24/2013   CLINICAL DATA:  History of glioblastoma multiform and, new onset of recurrent seizures. Evaluate for tumor progression. History of tumor resection and radiotherapy.  EXAM: MRI HEAD WITHOUT AND WITH CONTRAST  TECHNIQUE: Multiplanar, multiecho pulse sequences of the brain and surrounding structures were obtained according to standard protocol without and with intravenous contrast  CONTRAST:  18mL MULTIHANCE GADOBENATE DIMEGLUMINE 529 MG/ML IV SOLN  COMPARISON:  Prior CT from 10/22/2013 and MRI from 09/08/2013.  FINDINGS: The post contrast sequences are somewhat degraded by motion artifact.  Previously identified left parietal mass is decreased in size now measuring approximately 4.9 x 2.9 x 3.4 cm (series 14, image 35). This lesion measured approximately 4.1 x 5.6 x 4.4 cm on prior study. Associated vasogenic edema at these improved with decreased T2/FLAIR signal seen within the adjacent left parietal white matter. There is improved mass effect on the adjacent posterior horn of the left lateral ventricle.  Restricted diffusion seen centrally with numbness mass is present, like related to necrosis and/ or postoperative changes. An irregular peripheral rim hemosiderin deposition and/ or blood products is present about the central cavity of this lesion. There is persistent  irregular peripheral rim enhancement about the margins of the lesion, improved as compared to the prior exam.  Sequelae of left parietal craniotomy is seen overlying the mass. There is no extra-axial fluid collection.  No other abnormal foci of restricted diffusion are seen to suggest acute intracranial infarct. Normal flow voids are seen within the intracranial vasculature. No other mass lesion is identified. No mass effect is now seen within the brain. No other abnormal enhancement identified.  IMPRESSION: 1. Interval decrease in size of left parietal mass with improved vasogenic edema and resolved midline shift as compared to prior MRI from 09/08/2013. Persistent peripheral rim enhancement about this lesion may represent residual tumor versus radiation necrosis. 2. No other acute intracranial process identified within the brain.   Electronically Signed   By: Rise Mu M.D.   On: 10/24/2013 16:50    Assessment/Plan:  57yo M with recent dx of glioblastoma presents with diverticulitis/ileus c/b intra-abdominal abscesses, cx sensitive + ecoli isolated.  - change to ceftriaxone 2gm IV daily plus metronidazole 500mg  IV TID. Can switch to oral flagyl if he is taking orals after ileus resolved. We can also find oral regimen once ready for discharge. - will discontinue piptazo, no need for pseudomonal coverage. - will need 2-4 wk of therapy or possibly longer pending repeat imaging of abdominal fluid collection   Chanze Teagle B. Drue Second MD MPH [facility] [phone]

## 2013-10-25 NOTE — Progress Notes (Signed)
Called about patients increasing frequency of seizure activity and increasing duration.  I have discussed with both nurse and Dr. Leroy Kennedy the changes in patient.  We will increase Keppra to 1500 mg BID IV , load with Vimpat 200 mg STAT and then start Vimpat 100 mg BID.  Will continue to follow.   Felicie Morn PA-C [facility] [phone]  10/25/2013, 3:23 PM

## 2013-10-25 NOTE — Progress Notes (Signed)
Struggling with seizures. Neurology adjusting medications.  Patient denies abdominal pain. No nausea or vomiting. Following solid diet. Tolerating bowel movements. Drainage output flow. CAT scan without major changes - improved overall.  Patient with obese distended abdomen but is stable. Softer. No pain. No peritonitis.  Consider weaning off TNA which is in the process occurring. Probably DC home with antibiotics and drains. As stated removing the drains right now since he is more stable. Remove drains included if these further out. Awaiting input from infectious disease about type and duration of antibiotics to give the patient the best shot at recovering despite his severe immunosuppression and chronic steroids

## 2013-10-25 NOTE — Consult Note (Signed)
NAMEJOSIYAH, Martin Martin NO.:  [id number]  MEDICAL RECORD NO.:  [id number]  LOCATION:  92                         FACILITY:  [facility]  PHYSICIAN:  Volanda Napoleon, M.D.  DATE OF BIRTH:  01-Jul-1956  DATE OF CONSULTATION: DATE OF DISCHARGE:                                CONSULTATION   REFERRING PHYSICIAN:  Sheila Oats, M.D.  REASON FOR CONSULTATION: 1. Left frontal lobe glioblastoma multiforme. 2. Recurrent seizures. 3. Recent bowel perforation with status post exploratory laparotomy.  HISTORY OF PRESENT ILLNESS:  Martin Martin is a very nice 57 year old white gentleman.  He initially presented back in April after he had some right-sided weakness.  He was disoriented and confused.  He subsequently was found have a mass in the left parietal lobe.  This measured 3.6 x 4.7 x 3.9 cm.  He underwent a biopsy.  The report shows to be a high- grade glioblastoma.  He underwent a left parietal lobe craniotomy.  There was a gross total resection.  He subsequently underwent adjuvant therapy with radiation and Temodar. Unfortunately, his tumor appeared to recur fairly quickly.  He subsequently was started on, I think, Avastin.  He has been on Avastin for the recurrence.  He did not want any repeat surgery.  He had been doing well.  He had been recovering from his surgery quite nicely.  He then began to have some seizures. He had a repeat CT of the brain.  This seemed to show some improvement in the left parietal lobe mass.  It measured 2.6 x 2.8 cm.  There was slightly less mass effect.  Again, his Decadron was subsequently decreased for his surgery to help with healing.  This has been increased.  He has been seen by Neurology.  They readjusted his seizure medicines. He is on Keppra.  It seems like he may have had another seizure last evening.  This was very short lived.  He has had no fever.  He was an alert and oriented this morning.  He seems to have  pretty decent strength.  PAST MEDICAL HISTORY:  Documented in the medical record.  He has hyperlipidemia, right bundle branch block, hypertension.  ALLERGIES:  Dilaudid and morphine.  MEDICATIONS:  Includes:  Catapres patch 0.1 mg q. week, Decadron 4 mg IV q.12 hours, Invanz 1 g daily, Duragesic patch at 25 mcg every 3 days, hydralazine 10 mg IV q.6 h. p.r.n., Keppra 1500 mg IV q.12 h., Protonix 40 mg b.i.d.  SOCIAL HISTORY:  Remarkable for past tobacco use.  He has occasional alcohol use.  PHYSICAL EXAM:  GENERAL:  This is a cushingoid appearing white gentleman in no obvious distress. VITAL SIGNS:  Temperature of 97.5, pulse 79, respiratory rate 16, blood pressure 132/94. HEAD AND NECK:  A cushingoid face.  He has no thrush.  Pupils react appropriately.  There is no adenopathy in his neck. LUNGS:  Clear bilaterally. CARDIAC:  Regular rate and rhythm with a normal S1, S2.  There are no murmurs, rubs, or bruits. ABDOMEN:  Somewhat distended.  He has laparoscopy scars.  He has drainage tubes.  Bowel sounds are slightly decreased.  He has no guarding or rebound tenderness.  EXTREMITIES:  Some slight muscle atrophy in the upper and lower extremities.  There are no joint issues, may be some slight decreased strength over in the right leg. NEUROLOGICAL:  No focal neurological deficits.  LABORATORY STUDIES:  White cell count 14.2, hemoglobin 11.1, hematocrit 33.2, platelet count 257.  Pre-albumin is 30.8.  Potassium 3.8.  Magnesium 1.8.  IMPRESSION:  Martin Martin is a nice 57 year old white gentleman.  He has recurrent glioblastoma multiforme.  He was being treated with Avastin.  He subsequently developed bowel perforation.  He underwent exploratory laparotomy for this.  Because of this, he did have steroid dose decreased.  He subsequently had recurrent seizure over the weekend.  CT of the brain thankfully did not show any growth of his tumor.  Ultimately, I think that the  best way to try to deal with this issue is about resecting the recurrent disease.  I spoke to him about this.  He just does not think that he could go through another surgery.  I think that there may be some degree of brain irritability secondary to this tumor.  I wonder if an EEG would help.  We certainly can increase his steroid dose a little bit more.  The downside to this is going to be issues with delayed wound healing from his surgery.  This is a pretty complex subject.  I do not think there is a real "right or wrong answer."  Again, looking at the CT, it appears this recurrence is a localized.  Ideally, this would be resected.  I am not sure if Neurosurgery could even do that right now given his other issues.  I think that if he continues to have these seizure activity, then I think we will have to seriously look at reresecting this tumor.  Again, Neurology is following him.  They may feel an EEG could help. They may readjust his seizure medications.  Preop, as always, we had a good prayer session.  He is a man of strong faith.  We will do our best to follow him along and try to help out as much as possible.     Volanda Napoleon, M.D.     PRE/MEDQ  D:  10/24/2013  T:  10/25/2013  Job:  [id number]

## 2013-10-25 NOTE — Progress Notes (Signed)
12 Days Post-Op  Subjective: Pt doing well.  No pain, N/V.  Tolerating bland diet.  States had BM yesterday and passing flatus (none recorded by nursing staff).  Worked with OT today, doing exercises in bed/chair on own.  He would really like to ambulate OOB.    Objective: Vital signs in last 24 hours: Temp:  [97.5 F (36.4 C)-98 F (36.7 C)] 97.5 F (36.4 C) (11/05 0600) Pulse Rate:  [87-110] 104 (11/05 0600) Resp:  [18-20] 18 (11/05 0600) BP: (115-137)/(81-89) 137/89 mmHg (11/05 0600) SpO2:  [93 %-99 %] 97 % (11/05 0600) Last BM Date: 10/21/13  Intake/Output from previous day: 11/04 0701 - 11/05 0700 In: 1877.5 [P.O.:580; I.V.:1035; IV Piggyback:262.5] Out: 1896 [Urine:1870; Drains:25; Stool:1] Intake/Output this shift:    PE: Gen:  Alert, NAD, pleasant Abd: Soft, NT, moderate distension (may be norm vs steroids), +BS, suprapubic and LLQ drain with serous drainage, LUQ (splenic drain) with tan thick drainage, no rebound or guarding   Lab Results:   Recent Labs  10/23/13 0525 10/25/13 0510  WBC 14.2* 14.5*  HGB 11.1* 11.6*  HCT 33.2* 34.8*  PLT 257 266   BMET  Recent Labs  10/24/13 0605 10/25/13 0510  NA 136 133*  K 3.9 4.4  CL 99 96  CO2 27 25  GLUCOSE 141* 174*  BUN 15 19  CREATININE 0.51 0.65  CALCIUM 9.0 9.1   PT/INR No results found for this basename: LABPROT, INR,  in the last 72 hours CMP     Component Value Date/Time   NA 133* 10/25/2013 0510   NA 141 10/09/2013 1300   K 4.4 10/25/2013 0510   K 4.4 10/09/2013 1300   CL 96 10/25/2013 0510   CL 102 06/05/2013 1406   CO2 25 10/25/2013 0510   CO2 21* 10/09/2013 1300   GLUCOSE 174* 10/25/2013 0510   GLUCOSE 181* 10/09/2013 1300   GLUCOSE 165* 06/05/2013 1406   BUN 19 10/25/2013 0510   BUN 19.6 10/09/2013 1300   CREATININE 0.65 10/25/2013 0510   CREATININE 0.7 10/09/2013 1300   CALCIUM 9.1 10/25/2013 0510   CALCIUM 9.3 10/09/2013 1300   PROT 5.5* 10/23/2013 0525   PROT 6.9 10/09/2013 1300    ALBUMIN 2.3* 10/23/2013 0525   ALBUMIN 2.6* 10/09/2013 1300   AST 30 10/23/2013 0525   AST 30 10/09/2013 1300   ALT 66* 10/23/2013 0525   ALT 124* 10/09/2013 1300   ALKPHOS 122* 10/23/2013 0525   ALKPHOS 89 10/09/2013 1300   BILITOT 0.2* 10/23/2013 0525   BILITOT 0.35 10/09/2013 1300   GFRNONAA >90 10/25/2013 0510   GFRAA >90 10/25/2013 0510   Lipase     Component Value Date/Time   LIPASE 39 10/12/2013 1030       Studies/Results: Mr Laqueta Jean Wo Contrast  10/24/2013   CLINICAL DATA:  History of glioblastoma multiform and, new onset of recurrent seizures. Evaluate for tumor progression. History of tumor resection and radiotherapy.  EXAM: MRI HEAD WITHOUT AND WITH CONTRAST  TECHNIQUE: Multiplanar, multiecho pulse sequences of the brain and surrounding structures were obtained according to standard protocol without and with intravenous contrast  CONTRAST:  18mL MULTIHANCE GADOBENATE DIMEGLUMINE 529 MG/ML IV SOLN  COMPARISON:  Prior CT from 10/22/2013 and MRI from 09/08/2013.  FINDINGS: The post contrast sequences are somewhat degraded by motion artifact.  Previously identified left parietal mass is decreased in size now measuring approximately 4.9 x 2.9 x 3.4 cm (series 14, image 35). This lesion measured approximately 4.1 x  5.6 x 4.4 cm on prior study. Associated vasogenic edema at these improved with decreased T2/FLAIR signal seen within the adjacent left parietal white matter. There is improved mass effect on the adjacent posterior horn of the left lateral ventricle. Restricted diffusion seen centrally with numbness mass is present, like related to necrosis and/ or postoperative changes. An irregular peripheral rim hemosiderin deposition and/ or blood products is present about the central cavity of this lesion. There is persistent irregular peripheral rim enhancement about the margins of the lesion, improved as compared to the prior exam.  Sequelae of left parietal craniotomy is seen overlying the  mass. There is no extra-axial fluid collection.  No other abnormal foci of restricted diffusion are seen to suggest acute intracranial infarct. Normal flow voids are seen within the intracranial vasculature. No other mass lesion is identified. No mass effect is now seen within the brain. No other abnormal enhancement identified.  IMPRESSION: 1. Interval decrease in size of left parietal mass with improved vasogenic edema and resolved midline shift as compared to prior MRI from 09/08/2013. Persistent peripheral rim enhancement about this lesion may represent residual tumor versus radiation necrosis. 2. No other acute intracranial process identified within the brain.   Electronically Signed   By: Rise Mu M.D.   On: 10/24/2013 16:50   Ct Abdomen Pelvis W Contrast  10/23/2013   CLINICAL DATA:  Abdominal pain. Diverticulitis with colonic perforation.  EXAM: CT ABDOMEN AND PELVIS WITH CONTRAST  TECHNIQUE: Multidetector CT imaging of the abdomen and pelvis was performed using the standard protocol following bolus administration of intravenous contrast.  CONTRAST:  OMNIPAQUE IOHEXOL 300 MG/ML  SOLN  COMPARISON:  10/16/2013  FINDINGS: Atelectasis is identified within both lung bases. No focal liver abnormality identified. The gallbladder appears normal. No biliary dilatation. Normal appearance of the pancreas. The spleen is unremarkable.  The adrenal glands are both normal. Normal appearance of the right kidney. Left renal cyst is again identified. Nonobstructing calculus within the inferior pole of the left kidney measures 5 mm, image 51/ series 2. The urinary bladder appears within normal limits. Prostate gland and seminal vesicles are negative.  Normal caliber of the abdominal aorta. No upper abdominal adenopathy noted. There is no pelvic or inguinal adenopathy.  The stomach appears normal. Small bowel loops appear mildly increased in caliber compatible with the clinical history of ileus. Again noted  is extra luminal gas within the soft tissues of the left upper quadrant of the abdomen. Gas can be seen tracking around the stomach and into the posterior mediastinum. This appears similar to previous exam.  Multiple fluid collections are again noted within the upper abdomen. Index fluid collection ventral to the duodenum measures 4.1 cm, image 45/ series 2. Previously 3.7 cm. Index fluid collection posterior to the descending colon measures 4.8 cm, image 50/ series 2. This is compared with 5.6 cm previously. Fluid collection within the root of the mesenteric measures 4.1 x 1.9 cm, image 53/ series 2. Previously 5.2 x 2.8 cm. No new fluid collections identified. Similar appearance of percutaneous to drainage catheters within the central portion of the lower abdomen.  Review of the visualized osseous structures is significant for lumbar spondylosis. No aggressive lytic or sclerotic bone lesions.  IMPRESSION: 1. Multi focal fluid collections are again noted within the abdomen. When compared with the previous exam these are slightly decreased in size in the interval. No new or enlarging fluid collections identified.  2. Mild increased caliber of the small bowel  loops compatible with ileus. No specific features identified to suggest high-grade bowel obstruction.  3. Again noted is extraluminal gas within the soft tissues of the left upper quadrant of the abdomen and extending into the posterior mediastinum. Findings a compatible with known perforated viscus.   Electronically Signed   By: Signa Kell M.D.   On: 10/23/2013 15:35    Anti-infectives: Anti-infectives   Start     Dose/Rate Route Frequency Ordered Stop   10/24/13 1200  piperacillin-tazobactam (ZOSYN) IVPB 3.375 g     3.375 g 12.5 mL/hr over 240 Minutes Intravenous Every 8 hours 10/24/13 1114     10/13/13 1800  fluconazole (DIFLUCAN) IVPB 200 mg     200 mg 100 mL/hr over 60 Minutes Intravenous Every 24 hours 10/13/13 1707 10/22/13 1906   10/13/13  1715  ertapenem (INVANZ) 1 g in sodium chloride 0.9 % 50 mL IVPB  Status:  Discontinued     1 g 100 mL/hr over 30 Minutes Intravenous Every 24 hours 10/13/13 1707 10/13/13 1750   10/12/13 1600  fluconazole (DIFLUCAN) tablet 100 mg  Status:  Discontinued     100 mg Oral Daily 10/12/13 1428 10/13/13 1707   10/12/13 1600  sulfamethoxazole-trimethoprim (BACTRIM DS) 800-160 MG per tablet 1 tablet  Status:  Discontinued     1 tablet Oral Daily 10/12/13 1428 10/13/13 1707   10/12/13 1600  ertapenem (INVANZ) 1 g in sodium chloride 0.9 % 50 mL IVPB  Status:  Discontinued     1 g 100 mL/hr over 30 Minutes Intravenous Every 24 hours 10/12/13 1507 10/24/13 1107       Assessment/Plan Ileus from perf bowel (presumed diverticular) resolving s/p Diagnostic Lap with drain placements POD #11  1. Abscess collections significantly improved on 10/23/13 CT scan, clinically improved  2. CALLED ID CONSULT to determine if the patient needs to stay on IV antibiotics (and for length of treatment) at discharge or okay to discharge on PO Augmentin or ceflosporin and flagyl may do just as well per pharmacy.  Also, concern for Invanz lowering seizure threshold so changed to zosyn, would like their input on this as well.   His seizure activity prevention may need to outweigh the diverticular disease at this point.  3. PT/OT due to fall risk and seizure risk to help with ambulation  4. SCD's, lovenox, encouraged IS  5. May be able to d/c suprapubic and LLQ drains due to low serous output, but would not remove the LUQ drain site as it is thick tan fluid, would wait until becomes more clear, will leave the drain d/c up to Dr. Michaell Cowing today 6. Recheck CBC tomorrow  7. Cont. bland diet, may be able to wean TPN at some point, pending calorie count, dietitian to discuss diverticular disease diet  8. 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

## 2013-10-25 NOTE — Progress Notes (Addendum)
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 Patient still having seizures, gave Ativan this morning, will also consult neurology. Also patient is currently on Decadron 4 mg IV every 6 hours to decrease the edema in the brain.MRI done on 11/4 confirms Interval decrease in size of left parietal mass with improved vasogenic edema and resolved midline shift as compared to prior MRI.Dr Myna Hidalgo is following the patient.  Diverticulitis of colon with perforation. Patient is status post diagnostic laparoscopy with washout, no perforation was identified. The body fluid cultures grew Escherichia coli, patient was started on empiric Ertapenem, which has been switched over to Zosyn due to concerns for lowering the seizure threshold. Patient had NG tube which was taken out on 1029. Patient was started on clear liquid diet. Surgery is following.CT of 11/3 still shows extraluminal gas within the soft tissues of the  left upper quadrant of the abdomen and extending into the posterior  Mediastinum compatible with known perforated viscus  Diet advanced to bland diet per surgery, appreciate surgery assistance   Hypertension- continue metoprolol 10 mg IV every 6 hours and Catapres patch. Will switch to oral medications once patient seizures are controlled.  Leukocytosis-probably secondary to steroids, as the patient has been afebrile over the past 7 days.     Code Status: Full code Family Communication: *Discussed with wife at bedside Disposition Plan: Home when stable   Consultants:  Surgery  Neurology  Oncology  Procedures:  Diagnostic laproscopy  Antibiotics:  Ertapenerm  10/23- 11/4  Zosyn 11/4-   HPI/Subjective: Patient seen and examined, still having recurrent seizures lasting less than a minute.  Objective: Filed Vitals:   10/25/13 0600  BP: 137/89  Pulse: 104  Temp: 97.5 F (36.4 C)  Resp: 18    Intake/Output Summary (Last 24 hours) at 10/25/13 1446 Last data filed at 10/25/13 0600  Gross per 24 hour  Intake 1707.5 ml  Output   1046 ml  Net  661.5 ml   Filed Weights   10/16/13 0400 10/17/13 0500 10/23/13 1400  Weight: 89.5 kg (197 lb 5 oz) 88.6 kg (195 lb 5.2 oz) 88.9 kg (195 lb 15.8 oz)    Exam:   General:  Appear in no acute distress  Cardiovascular: S1S2 RRR  Respiratory: Clear bilaterally   Abdomen: Soft, nontender  Musculoskeletal: No edema  Data Reviewed: Basic Metabolic Panel:  Recent Labs Lab 10/19/13 0455  10/21/13 0440 10/22/13 0530 10/23/13 0525 10/24/13 0605 10/25/13 0510  NA 135  < > 133* 134* 136 136 133*  K 3.9  < > 4.2 3.7 3.8 3.9 4.4  CL 99  < > 99 98 99 99 96  CO2 24  < > 25 26 26 27 25   GLUCOSE 158*  < > 153* 164* 141* 141* 174*  BUN 16  < > 16 15 17 15 19   CREATININE 0.58  < > 0.60 0.55 0.53 0.51 0.65  CALCIUM 9.0  < > 9.4 9.0 8.7 9.0 9.1  MG 1.9  --   --   --  1.8  --   --   PHOS 3.1  --   --   --  3.6  --   --   < > = values  in this interval not displayed. Liver Function Tests:  Recent Labs Lab 10/19/13 0455 10/23/13 0525  AST 24 30  ALT 41 66*  ALKPHOS 81 122*  BILITOT 0.3 0.2*  PROT 6.2 5.5*  ALBUMIN 2.4* 2.3*   No results found for this basename: LIPASE, AMYLASE,  in the last 168 hours No results found for this basename: AMMONIA,  in the last 168 hours CBC:  Recent Labs Lab 10/19/13 0455 10/20/13 0512 10/22/13 0530 10/23/13 0525 10/25/13 0510  WBC 13.2* 13.3* 14.7* 14.2* 14.5*  NEUTROABS  --   --   --  12.5*  --   HGB 12.0* 11.7* 11.4* 11.1* 11.6*  HCT 34.7* 34.8* 34.2* 33.2* 34.8*  MCV 97.2 96.9 97.7 97.9 98.6  PLT 233 256 261 257 266   Cardiac Enzymes: No  results found for this basename: CKTOTAL, CKMB, CKMBINDEX, TROPONINI,  in the last 168 hours BNP (last 3 results) No results found for this basename: PROBNP,  in the last 8760 hours CBG:  Recent Labs Lab 10/28/13 2036 10-28-13 2354 10/25/13 0313 10/25/13 0735 10/25/13 1157  GLUCAP 193* 162* 184* 151* 200*    Recent Results (from the past 240 hour(s))  URINE CULTURE     Status: None   Collection Time    10/20/13  6:30 AM      Result Value Range Status   Specimen Description URINE, CLEAN CATCH   Final   Special Requests NONE   Final   Culture  Setup Time     Final   Value: 10/20/2013 09:23     Performed at [facility] Count     Final   Value: NO GROWTH     Performed at [facility]   Culture     Final   Value: NO GROWTH     Performed at [facility]   Report Status 10/21/2013 FINAL   Final     Studies: Mr Lodema Pilot Contrast  2013-10-28   CLINICAL DATA:  History of glioblastoma multiform and, new onset of recurrent seizures. Evaluate for tumor progression. History of tumor resection and radiotherapy.  EXAM: MRI HEAD WITHOUT AND WITH CONTRAST  TECHNIQUE: Multiplanar, multiecho pulse sequences of the brain and surrounding structures were obtained according to standard protocol without and with intravenous contrast  CONTRAST:  18mL MULTIHANCE GADOBENATE DIMEGLUMINE 529 MG/ML IV SOLN  COMPARISON:  Prior CT from 10/22/2013 and MRI from 09/08/2013.  FINDINGS: The post contrast sequences are somewhat degraded by motion artifact.  Previously identified left parietal mass is decreased in size now measuring approximately 4.9 x 2.9 x 3.4 cm (series 14, image 35). This lesion measured approximately 4.1 x 5.6 x 4.4 cm on prior study. Associated vasogenic edema at these improved with decreased T2/FLAIR signal seen within the adjacent left parietal white matter. There is improved mass effect on the adjacent posterior horn of the left lateral ventricle. Restricted  diffusion seen centrally with numbness mass is present, like related to necrosis and/ or postoperative changes. An irregular peripheral rim hemosiderin deposition and/ or blood products is present about the central cavity of this lesion. There is persistent irregular peripheral rim enhancement about the margins of the lesion, improved as compared to the prior exam.  Sequelae of left parietal craniotomy is seen overlying the mass. There is no extra-axial fluid collection.  No other abnormal foci of restricted diffusion are seen to suggest acute intracranial infarct. Normal flow voids are seen within the intracranial vasculature. No  other mass lesion is identified. No mass effect is now seen within the brain. No other abnormal enhancement identified.  IMPRESSION: 1. Interval decrease in size of left parietal mass with improved vasogenic edema and resolved midline shift as compared to prior MRI from 09/08/2013. Persistent peripheral rim enhancement about this lesion may represent residual tumor versus radiation necrosis. 2. No other acute intracranial process identified within the brain.   Electronically Signed   By: Rise Mu M.D.   On: 10/24/2013 16:50    Scheduled Meds: . antiseptic oral rinse  15 mL Mouth Rinse q12n4p  . calcium carbonate  1 tablet Oral Daily  . chlorhexidine  15 mL Mouth Rinse BID  . cloNIDine  0.1 mg Transdermal Weekly  . dexamethasone  4 mg Intravenous Q6H  . enoxaparin (LOVENOX) injection  40 mg Subcutaneous Q24H  . feeding supplement (ENSURE COMPLETE)  237 mL Oral BID BM  . fentaNYL  25 mcg Transdermal Q72H  . insulin aspart  0-20 Units Subcutaneous Q4H  . levETIRAcetam  1,250 mg Intravenous Q12H  . lip balm  1 application Topical BID  . metoprolol  10 mg Intravenous Q6H  . multivitamin with minerals  1 tablet Oral Daily  . pantoprazole  40 mg Oral BID  . piperacillin-tazobactam (ZOSYN)  IV  3.375 g Intravenous Q8H  . polyethylene glycol  17 g Oral Daily  .  saccharomyces boulardii  250 mg Oral BID  . sodium chloride  10-40 mL Intracatheter Q12H   Continuous Infusions: . Marland KitchenTPN (CLINIMIX-E) Adult 60 mL/hr at 10/24/13 1826  . Marland KitchenTPN (CLINIMIX-E) Adult    . 0.9 % NaCl with KCl 20 mEq / L 45 mL/hr (10/23/13 1301)    Principal Problem:   Diverticulitis of colon with perforation Active Problems:   Right sided weakness   Hyperglycemia   Thrush    Time spent: 25 min    [facility]  [facility] Pager [phone]. If 7PM-7AM, please contact night-coverage at [email or web address], password [facility] 10/25/2013, 2:46 PM  LOS: 13 days

## 2013-10-26 ENCOUNTER — Telehealth: Payer: Self-pay | Admitting: Radiation Oncology

## 2013-10-26 LAB — COMPREHENSIVE METABOLIC PANEL
ALT: 104 U/L — ABNORMAL HIGH (ref 0–53)
AST: 47 U/L — ABNORMAL HIGH (ref 0–37)
BUN: 19 mg/dL (ref 6–23)
CO2: 27 mEq/L (ref 19–32)
Calcium: 8.6 mg/dL (ref 8.4–10.5)
GFR calc Af Amer: >90 mL/min (ref >90)
GFR calc non Af Amer: >90 mL/min (ref >90)
Sodium: 136 mEq/L (ref 135–145)
Total Protein: 5.4 g/dL — ABNORMAL LOW (ref 6.0–8.3)

## 2013-10-26 LAB — CBC
HCT: 31.8 % — ABNORMAL LOW (ref 39.0–52.0)
MCHC: 33 g/dL (ref 30.0–36.0)
MCV: 98.8 fL (ref 78.0–100.0)
Platelets: 237 10*3/uL (ref 150–400)
RBC: 3.22 MIL/uL — ABNORMAL LOW (ref 4.22–5.81)
WBC: 11.9 10*3/uL — ABNORMAL HIGH (ref 4.0–10.5)

## 2013-10-26 LAB — GLUCOSE, CAPILLARY
Glucose-Capillary: 150 mg/dL — ABNORMAL HIGH (ref 70–99)
Glucose-Capillary: 130 mg/dL — ABNORMAL HIGH (ref 70–99)
Glucose-Capillary: 130 mg/dL — ABNORMAL HIGH (ref 70–99)

## 2013-10-26 LAB — PHOSPHORUS: Phosphorus: 4 mg/dL (ref 2.3–4.6)

## 2013-10-26 LAB — MAGNESIUM: Magnesium: 1.9 mg/dL (ref 1.5–2.5)

## 2013-10-26 MED ORDER — INSULIN ASPART 100 UNIT/ML ~~LOC~~ SOLN
0.0000 [IU] | Freq: Three times a day (TID) | SUBCUTANEOUS | Status: DC
  Administered 2013-10-26: 2 [IU] via SUBCUTANEOUS
  Administered 2013-10-26: 18:00:00 1 [IU] via SUBCUTANEOUS
  Administered 2013-10-27 (×2): 2 [IU] via SUBCUTANEOUS
  Administered 2013-10-28 (×2): 1 [IU] via SUBCUTANEOUS
  Administered 2013-10-28: 2 [IU] via SUBCUTANEOUS
  Administered 2013-10-29 – 2013-10-30 (×3): 1 [IU] via SUBCUTANEOUS
  Administered 2013-10-30 – 2013-10-31 (×2): 2 [IU] via SUBCUTANEOUS
  Administered 2013-10-31 – 2013-11-01 (×3): 1 [IU] via SUBCUTANEOUS
  Administered 2013-11-01 – 2013-11-02 (×2): 2 [IU] via SUBCUTANEOUS

## 2013-10-26 MED ORDER — HEPARIN (PORCINE) LOCK FLUSH 10 UNIT/ML IV SOLN
250.0000 [IU] | Freq: Once | INTRAVENOUS | Status: DC
  Filled 2013-10-26: qty 25

## 2013-10-26 MED ORDER — METRONIDAZOLE 500 MG PO TABS
500.0000 mg | ORAL_TABLET | Freq: Three times a day (TID) | ORAL | Status: DC
  Administered 2013-10-26 – 2013-11-02 (×20): 500 mg via ORAL
  Filled 2013-10-26 (×27): qty 1

## 2013-10-26 MED ORDER — BLISTEX EX OINT
TOPICAL_OINTMENT | Freq: Two times a day (BID) | CUTANEOUS | Status: DC
  Administered 2013-10-26: via TOPICAL
  Administered 2013-10-27: 1 via TOPICAL
  Administered 2013-10-27 – 2013-10-28 (×3): via TOPICAL
  Administered 2013-10-29: 1 via TOPICAL
  Administered 2013-10-29 – 2013-10-31 (×4): via TOPICAL
  Administered 2013-11-01: 1 via TOPICAL
  Administered 2013-11-01 – 2013-11-02 (×2): via TOPICAL
  Filled 2013-10-26 (×2): qty 10

## 2013-10-26 MED ORDER — SODIUM CHLORIDE 0.9 % IV SOLN
200.0000 mg | Freq: Two times a day (BID) | INTRAVENOUS | Status: DC
  Administered 2013-10-26 – 2013-11-02 (×14): 200 mg via INTRAVENOUS
  Filled 2013-10-26 (×31): qty 20

## 2013-10-26 NOTE — Progress Notes (Signed)
Chaplain consult with pt due to life changes.  Sister present at bedside. Introduced spiritual care as resource.  Provided emotional support around recent changes in pt condition, transition to [facility].   This chaplain oncall at [facility] and will follow pt transition.  Will forward to [facility] for follow up.   Belva Crome MDiv

## 2013-10-26 NOTE — Progress Notes (Signed)
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 Patient still having seizures, gave Ativan this morning, will also consult neurology. Also patient is currently on Decadron 4 mg IV every 6 hours to decrease the edema in the brain.MRI done on 11/4 confirms Interval decrease in size of left parietal mass with improved vasogenic edema and resolved midline shift as compared to prior MRI.Dr Myna Hidalgo is following the patient. As patient continues to have seizures, neurology has increased the dose of Vimpat to 200 mg po BID, and Keppra has been increased to 1500 mg po BID.   Diverticulitis of colon with perforation. Patient is status post diagnostic laparoscopy with washout, no perforation was identified. The body fluid cultures grew Escherichia coli, patient was started on empiric Ertapenem, which has been switched over to Zosyn due to concerns for lowering the seizure threshold. Zosyn has now been discontinued and he has been started on ceftriaxone 2 gm IV daily along with IV flagyl 500  Mg TID, see ID recommendations. He will need 2-4 weeks of antibiotics. Can be switched to oral flagyl once ileus resolved, still having recurrent seizures, so will continue with IV Flagyl for now. Patient had NG tube which was taken out on 1029. Patient was started on clear liquid diet. Surgery is following.  Hyperglycemia- secondary to steroids, will continue on Sliding scale insulin.  Hypertension- continue metoprolol 10 mg IV every 6 hours and Catapres patch. Will switch to oral medications once patient seizures are controlled.  Leukocytosis-probably secondary to steroids, as the patient has been  afebrile over the past 7 days.  Oral thrush- Continue with Diflucan.    Code Status: Full code Family Communication: *Discussed with wife at bedside Disposition Plan: Patient requesting to be transferred to [facility]. Called and discussed with Dr Eddie North, who has accepted the patient at [facility].   Consultants:  Surgery  Neurology  Oncology  Procedures:  Diagnostic laproscopy  Antibiotics:  Ertapenerm 10/23- 11/4  Zosyn 11/4-   HPI/Subjective: Patient seen and examined, still having recurrent seizures lasting less than a minute.  Objective: Filed Vitals:   10/26/13 0413  BP: 139/94  Pulse: 103  Temp: 98.5 F (36.9 C)  Resp: 18    Intake/Output Summary (Last 24 hours) at 10/26/13 1024 Last data filed at 10/26/13 9562  Gross per 24 hour  Intake 2107.5 ml  Output   2165 ml  Net  -57.5 ml   Filed Weights   10/16/13 0400 10/17/13 0500 10/23/13 1400  Weight: 89.5 kg (197 lb 5 oz) 88.6 kg (195 lb 5.2 oz) 88.9 kg (195 lb 15.8 oz)    Exam:   General:  Appear in no acute distress  Cardiovascular: S1S2 RRR  Respiratory: Clear bilaterally       Abdomen: Soft, nontender  Musculoskeletal: No edema  Neurology: Pleasantly confused,  postictal  Data Reviewed: Basic Metabolic Panel:  Recent Labs Lab 10/22/13 0530 10/23/13 0525 10/24/13 0605 10/25/13 0510 10/26/13 0530  NA 134* 136 136 133* 136  K 3.7 3.8 3.9 4.4 4.0  CL 98 99 99 96 98  CO2 26 26 27 25 27   GLUCOSE 164* 141* 141* 174* 147*  BUN 15 17 15 19 19   CREATININE 0.55 0.53 0.51 0.65 0.52  CALCIUM 9.0 8.7 9.0 9.1 8.6  MG  --  1.8  --   --  1.9  PHOS  --  3.6  --   --  4.0   Liver Function Tests:  Recent Labs Lab 10/23/13 0525 10/26/13 0530  AST 30 47*  ALT 66* 104*  ALKPHOS 122* 108  BILITOT 0.2* 0.2*  PROT 5.5* 5.4*  ALBUMIN 2.3* 2.4*   No results found for this basename: LIPASE, AMYLASE,  in the last 168 hours No results found for this basename: AMMONIA,  in the last 168  hours CBC:  Recent Labs Lab 10/20/13 0512 10/22/13 0530 10/23/13 0525 10/25/13 0510 10/26/13 0530  WBC 13.3* 14.7* 14.2* 14.5* 11.9*  NEUTROABS  --   --  12.5*  --   --   HGB 11.7* 11.4* 11.1* 11.6* 10.5*  HCT 34.8* 34.2* 33.2* 34.8* 31.8*  MCV 96.9 97.7 97.9 98.6 98.8  PLT 256 261 257 266 237   Cardiac Enzymes: No results found for this basename: CKTOTAL, CKMB, CKMBINDEX, TROPONINI,  in the last 168 hours BNP (last 3 results) No results found for this basename: PROBNP,  in the last 8760 hours CBG:  Recent Labs Lab 10/25/13 1619 10/25/13 2023 10/25/13 2352 10/26/13 0411 10/26/13 0745  GLUCAP 178* 164* 164* 150* 130*    Recent Results (from the past 240 hour(s))  URINE CULTURE     Status: None   Collection Time    10/20/13  6:30 AM      Result Value Range Status   Specimen Description URINE, CLEAN CATCH   Final   Special Requests NONE   Final   Culture  Setup Time     Final   Value: 10/20/2013 09:23     Performed at [facility] Count     Final   Value: NO GROWTH     Performed at [facility]   Culture     Final   Value: NO GROWTH     Performed at [facility]   Report Status 10/21/2013 FINAL   Final     Studies: Mr Lodema Pilot Contrast  12-Nov-2013   CLINICAL DATA:  History of glioblastoma multiform and, new onset of recurrent seizures. Evaluate for tumor progression. History of tumor resection and radiotherapy.  EXAM: MRI HEAD WITHOUT AND WITH CONTRAST  TECHNIQUE: Multiplanar, multiecho pulse sequences of the brain and surrounding structures were obtained according to standard protocol without and with intravenous contrast  CONTRAST:  18mL MULTIHANCE GADOBENATE DIMEGLUMINE 529 MG/ML IV SOLN  COMPARISON:  Prior CT from 10/22/2013 and MRI from 09/08/2013.  FINDINGS: The post contrast sequences are somewhat degraded by motion artifact.  Previously identified left parietal mass is decreased in size now measuring approximately 4.9 x 2.9  x 3.4 cm (series 14, image 35). This lesion measured approximately 4.1 x 5.6 x 4.4 cm on prior study. Associated vasogenic edema at these improved with decreased T2/FLAIR signal seen within the adjacent left parietal white matter. There is improved mass effect on the adjacent posterior horn of the left lateral ventricle. Restricted diffusion seen centrally with numbness mass is present, like related to necrosis and/ or postoperative changes. An irregular peripheral rim hemosiderin deposition and/ or blood products is present about the central cavity of this lesion. There is persistent irregular peripheral rim enhancement about the margins of the lesion, improved as compared to the prior exam.  Sequelae of left parietal craniotomy is seen overlying the mass. There is  no extra-axial fluid collection.  No other abnormal foci of restricted diffusion are seen to suggest acute intracranial infarct. Normal flow voids are seen within the intracranial vasculature. No other mass lesion is identified. No mass effect is now seen within the brain. No other abnormal enhancement identified.  IMPRESSION: 1. Interval decrease in size of left parietal mass with improved vasogenic edema and resolved midline shift as compared to prior MRI from 09/08/2013. Persistent peripheral rim enhancement about this lesion may represent residual tumor versus radiation necrosis. 2. No other acute intracranial process identified within the brain.   Electronically Signed   By: Rise Mu M.D.   On: 10/24/2013 16:50    Scheduled Meds: . antiseptic oral rinse  15 mL Mouth Rinse q12n4p  . calcium carbonate  1 tablet Oral Daily  . cefTRIAXone (ROCEPHIN)  IV  2 g Intravenous Q24H  . chlorhexidine  15 mL Mouth Rinse BID  . cloNIDine  0.1 mg Transdermal Weekly  . dexamethasone  4 mg Intravenous Q6H  . enoxaparin (LOVENOX) injection  40 mg Subcutaneous Q24H  . feeding supplement (ENSURE COMPLETE)  237 mL Oral BID BM  . fentaNYL  25 mcg  Transdermal Q72H  . insulin aspart  0-20 Units Subcutaneous Q4H  . lacosamide (VIMPAT) IV  200 mg Intravenous Q12H  . levETIRAcetam  1,500 mg Intravenous Q12H  . lip balm  1 application Topical BID  . metoprolol  10 mg Intravenous Q6H  . metroNIDAZOLE  500 mg Oral Q8H  . multivitamin with minerals  1 tablet Oral Daily  . pantoprazole  40 mg Oral BID  . polyethylene glycol  17 g Oral Daily  . saccharomyces boulardii  250 mg Oral BID  . sodium chloride  10-40 mL Intracatheter Q12H   Continuous Infusions: . Marland KitchenTPN (CLINIMIX-E) Adult 40 mL/hr at 10/25/13 1810  . 0.9 % NaCl with KCl 20 mEq / L 45 mL/hr at 10/26/13 0500    Principal Problem:   Diverticulitis of colon with perforation Active Problems:   Right sided weakness   Hyperglycemia   Thrush    Time spent: 25 min    [facility]  [facility] Pager [phone]. If 7PM-7AM, please contact night-coverage at [email or web address], password [facility] 10/26/2013, 10:24 AM  LOS: 14 days

## 2013-10-26 NOTE — Progress Notes (Signed)
[facility]    Date of Admission:  10/12/2013   Total days of antibiotics 15        Day 2 ceftriaxone        Day 2 metronidazole        ( 12 days of ertapenem and 2 days piptazo)   ID: Martin Martin is a 57 y.o. male with recent dx of glioblastoma of left parietla brain s/p bx/debulking plus radiation and adjuvant chemo presents with diverticulitis of colon with perforation s/p diagnostic laparoscopy on 10/24 with washout but perforation was not identified. Pt was started on TNA for prolonged ileus 10/27. Hospitalization is now complicated by uncontrolled seizures  Principal Problem:   Diverticulitis of colon with perforation Active Problems:   Right sided weakness   Hyperglycemia   Thrush    Subjective: Afebrile, but reportedly still having simple partial seizure this morning witnessed by neurology, with right arm jerking and eyes deviated to the right.< 30 sec. Patient requesting to transfer to [facility].  Medications:  . antiseptic oral rinse  15 mL Mouth Rinse q12n4p  . calcium carbonate  1 tablet Oral Daily  . cefTRIAXone (ROCEPHIN)  IV  2 g Intravenous Q24H  . chlorhexidine  15 mL Mouth Rinse BID  . cloNIDine  0.1 mg Transdermal Weekly  . dexamethasone  4 mg Intravenous Q6H  . enoxaparin (LOVENOX) injection  40 mg Subcutaneous Q24H  . fentaNYL  25 mcg Transdermal Q72H  . insulin aspart  0-9 Units Subcutaneous TID WC  . lacosamide (VIMPAT) IV  200 mg Intravenous Q12H  . levETIRAcetam  1,500 mg Intravenous Q12H  . lip balm  1 application Topical BID  . metoprolol  10 mg Intravenous Q6H  . metroNIDAZOLE  500 mg Oral Q8H  . multivitamin with minerals  1 tablet Oral Daily  . pantoprazole  40 mg Oral BID  . polyethylene glycol  17 g Oral Daily  . saccharomyces boulardii  250 mg Oral BID  . sodium chloride  10-40 mL Intracatheter Q12H    Objective: Vital signs in last 24 hours: Temp:  [97.6 F (36.4 C)-98.5 F (36.9 C)] 98.2 F (36.8 C)  (11/06 1407) Pulse Rate:  [103-116] 110 (11/06 1407) Resp:  [18-20] 18 (11/06 1407) BP: (115-139)/(80-94) 115/84 mmHg (11/06 1407) SpO2:  [98 %-99 %] 98 % (11/06 1407)   Physical Exam  Constitutional: oriented to person, place, and time. He appears well-developed and well-nourished. No distress. Sleepy. Left occipital area shaved  HENT:  Mouth/Throat: Oropharynx is clear and moist. No oropharyngeal exudate.  Cardiovascular: Normal rate, regular rhythm and normal heart sounds. Exam reveals no gallop and no friction rub.  No murmur heard.  Pulmonary/Chest: Effort normal and breath sounds normal. No respiratory distress. He has no wheezes.  Abdominal: protuberant abdomen, distended, nontender, 3 drains in LLQ, 1st drain purulent white matter. 2nd drain serous, 3rd drain purulent mattter. Hypoactive bowel sounds Lymphadenopathy:  He has no cervical adenopathy.  Neurological: He is alert and oriented to person, place, and time.  Skin: Skin is warm and dry. Diffuse echymosis to abdomen and arms.     Lab Results  Recent Labs  10/25/13 0510 10/26/13 0530  WBC 14.5* 11.9*  HGB 11.6* 10.5*  HCT 34.8* 31.8*  NA 133* 136  K 4.4 4.0  CL 96 98  CO2 25 27  BUN 19 19  CREATININE 0.65 0.52   Liver Panel  Recent Labs  10/26/13 0530  PROT 5.4*  ALBUMIN 2.4*  AST 47*  ALT 104*  ALKPHOS 108  BILITOT 0.2*    Microbiology: 10/24 body fluid: ecoli (R amp, R tmp/smx, all else is S)  Studies/Results: Mr Laqueta Jean Wo Contrast  10/24/2013   CLINICAL DATA:  History of glioblastoma multiform and, new onset of recurrent seizures. Evaluate for tumor progression. History of tumor resection and radiotherapy.  EXAM: MRI HEAD WITHOUT AND WITH CONTRAST  TECHNIQUE: Multiplanar, multiecho pulse sequences of the brain and surrounding structures were obtained according to standard protocol without and with intravenous contrast  CONTRAST:  18mL MULTIHANCE GADOBENATE DIMEGLUMINE 529 MG/ML IV SOLN   COMPARISON:  Prior CT from 10/22/2013 and MRI from 09/08/2013.  FINDINGS: The post contrast sequences are somewhat degraded by motion artifact.  Previously identified left parietal mass is decreased in size now measuring approximately 4.9 x 2.9 x 3.4 cm (series 14, image 35). This lesion measured approximately 4.1 x 5.6 x 4.4 cm on prior study. Associated vasogenic edema at these improved with decreased T2/FLAIR signal seen within the adjacent left parietal white matter. There is improved mass effect on the adjacent posterior horn of the left lateral ventricle. Restricted diffusion seen centrally with numbness mass is present, like related to necrosis and/ or postoperative changes. An irregular peripheral rim hemosiderin deposition and/ or blood products is present about the central cavity of this lesion. There is persistent irregular peripheral rim enhancement about the margins of the lesion, improved as compared to the prior exam.  Sequelae of left parietal craniotomy is seen overlying the mass. There is no extra-axial fluid collection.  No other abnormal foci of restricted diffusion are seen to suggest acute intracranial infarct. Normal flow voids are seen within the intracranial vasculature. No other mass lesion is identified. No mass effect is now seen within the brain. No other abnormal enhancement identified.  IMPRESSION: 1. Interval decrease in size of left parietal mass with improved vasogenic edema and resolved midline shift as compared to prior MRI from 09/08/2013. Persistent peripheral rim enhancement about this lesion may represent residual tumor versus radiation necrosis. 2. No other acute intracranial process identified within the brain.   Electronically Signed   By: Rise Mu M.D.   On: 10/24/2013 16:50     Assessment/Plan: 57yo M with recent dx of glioblastoma presents with diverticulitis/ileus c/b intra-abdominal abscesses, cx sensitive + ecoli isolated. Hospitalization now  complicated by poorly controlled seizures.  - continue with ceftriaxone 2gm IV daily plus metronidazole 500mg  IV TID. Can switch to oral flagyl if he is taking orals after ileus resolved.  - will need 2-4 wk of therapy or possibly longer pending repeat imaging of abdominal fluid collection  - we will have him followed by ID service at [facility] upon transfer   [facility], [facility] for Infectious Diseases Cell: [phone] Pager: [phone]  10/26/2013, 3:02 PM

## 2013-10-26 NOTE — Progress Notes (Signed)
Patient had 3 seizure activity of short duration from 7pm tonight. Ativan 2mg  IV given. Will continue to [facility] patient.

## 2013-10-26 NOTE — Progress Notes (Signed)
PT Cancellation Note  Patient Details Name: Martin Martin MRN: [id number] DOB: [birth year]   Cancelled Treatment:    Reason Eval/Treat Not Completed: Medical issues which prohibited therapy RN reports pt with seizure this morning, just medicated and resting in bed and asked for pt to remain in bed at this time.  Pt with possible transfer to [facility] per chart review.  Will check back as schedule permits.   Savi Lastinger,KATHrine E 10/26/2013, 1:22 PM Zenovia Jarred, PT, DPT 10/26/2013 Pager: [phone]

## 2013-10-26 NOTE — Progress Notes (Signed)
Pt left facility with [facility] at this time for transfer to [facility]. Pt alert at this moment; confused at other times. Pt consents to transfer after requesting this day to be transferred to [facility]. Pt's sister aware of transfer.

## 2013-10-26 NOTE — Progress Notes (Signed)
Follow up:   I was asked to see Martin Martin s/p transfer from [facility]. Martin Martin is a 57 y.o. male with a PMH of glioblastoma of the left parietal brain (recent dx), s/p biopsy/debulking in April 2014 followed by radiation and adjuvant chemotherapy consisting of Temodar, currently being managed with Avastin every 2 weeks. He subsequently was started on steroids secondary to progression of disease with right-sided weakness in September 2014. The pt was admitted to [facility] on [id number] after presentiming to the ED w/ a 2-3 day history of abd pain and abd bloating. He had not had a BM in 2 days despite treatment with magnesium citrate and only minimal results with a Fleets enema. Ct revealed free air suggestive of bowel perf and surgery was consulted. Pt developed focal seizures on 10/22/13 and neurology was consulted. Pt has been transferred to [facility] room 4N-06 at his request. At bedside pt noted resting quietly w/ eyes closed. VSS. Cardiac telemetry reveals St w/ rate of 106. RN reports pt has had no c/o's since arrival. Will continue to monitor closely.   Leanne Chang, NP-C [facility] Pager [phone]

## 2013-10-26 NOTE — Progress Notes (Signed)
NEURO HOSPITALIST PROGRESS NOTE   SUBJECTIVE:                                                                                                                        Patient is awake and oriented.  He shows lethargy and while i was in the room had a 20 second simple partial  seizure.  His right arm was rhythmically jerking , head turned to the right and eyes deviated to the right. He stated "i cannot" when I asked him to look at me.  This subsided quickly and he was back to his baseline.   OBJECTIVE:                                                                                                                           Vital signs in last 24 hours: Temp:  [97.6 F (36.4 C)-98.5 F (36.9 C)] 98.5 F (36.9 C) (11/06 0413) Pulse Rate:  [103-116] 103 (11/06 0413) Resp:  [18-20] 18 (11/06 0413) BP: (121-139)/(80-94) 139/94 mmHg (11/06 0413) SpO2:  [98 %-99 %] 98 % (11/06 0413)  Intake/Output from previous day: 11/05 0701 - 11/06 0700 In: 2107.5 [P.O.:600; I.V.:1010; IV Piggyback:215; TPN:280] Out: 1765 [Urine:1750; Drains:15] Intake/Output this shift: Total I/O In: 120 [P.O.:120] Out: 400 [Urine:400] Nutritional status: Parke Simmers  Past Medical History  Diagnosis Date  . Hyperlipidemia   . Right bundle branch block     Normal LV function normal aortic and mitral valve bedside echocardiogram 4 2012  . Tachycardia     Resolved  . Dyslipidemia   . Coronary artery disease (CAD) excluded     Cardiolite study 2006 within normal limits  . Hypertension   . Allergy   . Cancer      Neurologic Exam:  Mental Status: Alert, oriented, thought content appropriate.  Speech dysarthric without evidence of aphasia.  Able to follow 3 step commands without difficulty. Cranial Nerves: II:  Visual fields grossly normal, pupils equal, round, reactive to light and accommodation III,IV, VI: ptosis not present, extra-ocular motions intact bilaterally V,VII: smile  symmetric, facial light touch sensation normal bilaterally VIII: hearing normal bilaterally IX,X: gag reflex present XI: bilateral shoulder shrug XII: midline tongue extension without atrophy or fasciculations  Motor: Right :  Upper extremity   5/5    Left:     Upper extremity   5/5  Lower extremity   5/5     Lower extremity   5/5 Tone and bulk:normal tone throughout; no atrophy noted Sensory: Pinprick and light touch intact throughout, bilaterally Deep Tendon Reflexes:  Right: Upper Extremity   Left: Upper extremity   biceps (C-5 to C-6) 2/4   biceps (C-5 to C-6) 2/4 tricep (C7) 2/4    triceps (C7) 2/4 Brachioradialis (C6) 2/4  Brachioradialis (C6) 2/4  Lower Extremity Lower Extremity  quadriceps (L-2 to L-4) 2/4   quadriceps (L-2 to L-4) 2/4 Achilles (S1) 2/4   Achilles (S1) 2/4  Plantars: Right: downgoing   Left: downgoing    Lab Results: No results found for this basename: cbc, bmp, coags, chol, tri, ldl, hga1c   Lipid Panel No results found for this basename: CHOL, TRIG, HDL, CHOLHDL, VLDL, LDLCALC,  in the last 72 hours  Studies/Results: Mr Laqueta Jean Wo Contrast  10/24/2013   CLINICAL DATA:  History of glioblastoma multiform and, new onset of recurrent seizures. Evaluate for tumor progression. History of tumor resection and radiotherapy.  EXAM: MRI HEAD WITHOUT AND WITH CONTRAST  TECHNIQUE: Multiplanar, multiecho pulse sequences of the brain and surrounding structures were obtained according to standard protocol without and with intravenous contrast  CONTRAST:  18mL MULTIHANCE GADOBENATE DIMEGLUMINE 529 MG/ML IV SOLN  COMPARISON:  Prior CT from 10/22/2013 and MRI from 09/08/2013.  FINDINGS: The post contrast sequences are somewhat degraded by motion artifact.  Previously identified left parietal mass is decreased in size now measuring approximately 4.9 x 2.9 x 3.4 cm (series 14, image 35). This lesion measured approximately 4.1 x 5.6 x 4.4 cm on prior study. Associated vasogenic  edema at these improved with decreased T2/FLAIR signal seen within the adjacent left parietal white matter. There is improved mass effect on the adjacent posterior horn of the left lateral ventricle. Restricted diffusion seen centrally with numbness mass is present, like related to necrosis and/ or postoperative changes. An irregular peripheral rim hemosiderin deposition and/ or blood products is present about the central cavity of this lesion. There is persistent irregular peripheral rim enhancement about the margins of the lesion, improved as compared to the prior exam.  Sequelae of left parietal craniotomy is seen overlying the mass. There is no extra-axial fluid collection.  No other abnormal foci of restricted diffusion are seen to suggest acute intracranial infarct. Normal flow voids are seen within the intracranial vasculature. No other mass lesion is identified. No mass effect is now seen within the brain. No other abnormal enhancement identified.  IMPRESSION: 1. Interval decrease in size of left parietal mass with improved vasogenic edema and resolved midline shift as compared to prior MRI from 09/08/2013. Persistent peripheral rim enhancement about this lesion may represent residual tumor versus radiation necrosis. 2. No other acute intracranial process identified within the brain.   Electronically Signed   By: Rise Mu M.D.   On: 10/24/2013 16:50    MEDICATIONS  Scheduled: . antiseptic oral rinse  15 mL Mouth Rinse q12n4p  . calcium carbonate  1 tablet Oral Daily  . cefTRIAXone (ROCEPHIN)  IV  2 g Intravenous Q24H  . chlorhexidine  15 mL Mouth Rinse BID  . cloNIDine  0.1 mg Transdermal Weekly  . dexamethasone  4 mg Intravenous Q6H  . enoxaparin (LOVENOX) injection  40 mg Subcutaneous Q24H  . feeding supplement (ENSURE COMPLETE)  237 mL Oral BID BM  . fentaNYL  25  mcg Transdermal Q72H  . insulin aspart  0-20 Units Subcutaneous Q4H  . lacosamide (VIMPAT) IV  200 mg Intravenous Q12H  . levETIRAcetam  1,500 mg Intravenous Q12H  . lip balm  1 application Topical BID  . metoprolol  10 mg Intravenous Q6H  . metroNIDAZOLE  500 mg Oral Q8H  . multivitamin with minerals  1 tablet Oral Daily  . pantoprazole  40 mg Oral BID  . polyethylene glycol  17 g Oral Daily  . saccharomyces boulardii  250 mg Oral BID  . sodium chloride  10-40 mL Intracatheter Q12H    ASSESSMENT/PLAN:                                                                                                            Seizure: Patietn continues to have multiple simple partial seizures.  Over the night night and this AM he received Ativan which made him very drowsy.  We have increased his Keppra to 1500 BID and added Vimpat 100 mg BID.  HE continues to seize through this medication.  Will increase his Vimpat to 200 mg BID in addition to his Keppra. PAtient has requested to be transferred to [facility].    Will continue to follow.   Assessment and plan discussed with with attending physician and they are in agreement.    Felicie Morn PA-C [facility] [phone]  10/26/2013, 10:11 AM

## 2013-10-26 NOTE — Progress Notes (Signed)
NUTRITION FOLLOW UP  Intervention:   TPN wean per Pharmacy; d/c today per pharmacy Discontinue Ensure supplements Encouraged PO intake Pt eating 100% of most meals; RD signing-off, please re-consult dietitian if additional nutrition issues arise  Nutrition Dx:   Inadequate oral intake related to inability to eat as evidenced by pt NPO due to perforated colon; discontinued  Goal:   Pt to meet >/= 90% of their estimated nutrition needs; likely being met  Monitor:   TPN rate; to be discontinued today Weight trends; stable Labs: blood glucose range 130-200; Potassium, Magnesium, and Phosphorus all WNL; high triglycerides; low hemoglobin, low albumin Diet advancement: clear liquids 10/20 and full liquids 11/1, bland diet on 11/3 Bowel function: BM 10/30 and 11/1   Assessment:   57 y/o who is undergoing chemo and radiation therapy for a recurrent glioblastoma. He was seen last month and found to have a left parietal mass lesion showing progression in the wall of the mass with central necrosis, on MRI. His last treatment with Avastin was 10/09/13. Since that time he started having abdominal pain, which has become progressively worse. He presented to the ER with findings on CT showing free air LUQ, and descending colon. Multiple colonic diverticula, small bowel loops with thickening And a fluid collection 5.2 x 3.7 cm. S/P diagnostic laparoscopy and Placement of Drains. Started on TPN due to prolonged post-op ileus.   TPN to be discontinued today. Pt states his appetite continues to be good and he is eating most of all meals. Added Ensure Complete yesterday to help meet patients increased energy/protein needs. Pt not interested in supplements or snacks at this time, states he is eating well. Discussed ways to meet increased nutrient needs. Encouraged pt to continue eating 3 meals daily with protein-rich foods; encouraged snacking as tolerated.  Pt's weight has been stable at 195 lbs.     Height: Ht Readings from Last 1 Encounters:  10/13/13 6' (1.829 m)    Weight Status:   Wt Readings from Last 1 Encounters:  10/23/13 195 lb 15.8 oz (88.9 kg)    Re-estimated needs:  Kcal: 2300-2500  Protein: 110-125 grams  Fluid: 2.3-2.5 L/day  Skin: multiple abdominal incisions with closed system drains  Diet Order: Parke Simmers   Intake/Output Summary (Last 24 hours) at 10/26/13 1223 Last data filed at 10/26/13 0832  Gross per 24 hour  Intake 2107.5 ml  Output   2165 ml  Net  -57.5 ml    Last BM: 11/1   Labs:   Recent Labs Lab 10/23/13 0525 10/24/13 0605 10/25/13 0510 10/26/13 0530  NA 136 136 133* 136  K 3.8 3.9 4.4 4.0  CL 99 99 96 98  CO2 26 27 25 27   BUN 17 15 19 19   CREATININE 0.53 0.51 0.65 0.52  CALCIUM 8.7 9.0 9.1 8.6  MG 1.8  --   --  1.9  PHOS 3.6  --   --  4.0  GLUCOSE 141* 141* 174* 147*    CBG (last 3)   Recent Labs  10/26/13 0411 10/26/13 0745 10/26/13 1145  GLUCAP 150* 130* 173*    Scheduled Meds: . antiseptic oral rinse  15 mL Mouth Rinse q12n4p  . calcium carbonate  1 tablet Oral Daily  . cefTRIAXone (ROCEPHIN)  IV  2 g Intravenous Q24H  . chlorhexidine  15 mL Mouth Rinse BID  . cloNIDine  0.1 mg Transdermal Weekly  . dexamethasone  4 mg Intravenous Q6H  . enoxaparin (LOVENOX) injection  40 mg  Subcutaneous Q24H  . feeding supplement (ENSURE COMPLETE)  237 mL Oral BID BM  . fentaNYL  25 mcg Transdermal Q72H  . insulin aspart  0-9 Units Subcutaneous TID WC  . lacosamide (VIMPAT) IV  200 mg Intravenous Q12H  . levETIRAcetam  1,500 mg Intravenous Q12H  . lip balm  1 application Topical BID  . metoprolol  10 mg Intravenous Q6H  . metroNIDAZOLE  500 mg Oral Q8H  . multivitamin with minerals  1 tablet Oral Daily  . pantoprazole  40 mg Oral BID  . polyethylene glycol  17 g Oral Daily  . saccharomyces boulardii  250 mg Oral BID  . sodium chloride  10-40 mL Intracatheter Q12H    Continuous Infusions: . Marland KitchenTPN (CLINIMIX-E)  Adult 40 mL/hr at 10/25/13 1810  . 0.9 % NaCl with KCl 20 mEq / L 45 mL/hr at 10/26/13 0500    Ian Malkin RD, LDN Inpatient Clinical Dietitian Pager: [phone] After Hours Pager: [phone]

## 2013-10-26 NOTE — Progress Notes (Signed)
OT Cancellation Note  Patient Details Name: Martin Martin MRN: [id number] DOB: [birth year]   Cancelled Treatment:    Reason Eval/Treat Not Completed: Medical issues which prohibited therapy                                           Pt. Has Con't. With seizure activity yesterday through this a.m. and is not medically appropriate at this time.                                             Will check back and con't. Skilled o.t. Once pt. Able.    Robet Leu, COTA/L 10/26/2013, 9:08 AM

## 2013-10-26 NOTE — Progress Notes (Signed)
Tolerating solid diet. Having bowel function. Weaning off TNA. Colonic perforation stable with the drains and antibiotics. Appreciate ID input about being continued antibiotics. I would leave the drains and continue antibiotics for another 3 weeks.  I would not CT scan the patient until 3 weeks unless change in drainage output or abdominal function or new pain.  Worsening seizures and neurological issues. [facility]. We will be able to follow them on the [facility] surgical service.

## 2013-10-26 NOTE — Telephone Encounter (Signed)
Returned call from [employer]. Martin Martin reports her brother is being moved to [facility] at his request. Explained this Clinical research associate, Dr. Kathrynn Running, and Dr. Colin Benton will continue to follow him while at [facility]. She expressed the seizures are happening more frequently. Explained that the recent scans shows the medication is helping. Encouraged her to have faith that the doctors are working diligently to find a mediation regimen that will help control her brothers seizures. Promised to route this note to Dr. Kathrynn Running and Dr. Colin Benton so "they would know where to find [name]."

## 2013-10-26 NOTE — Progress Notes (Signed)
13 Days Post-Op  Subjective: Pt denies any abdominal pain, N/V, good appetite.  +BM yesterday, some flatus today.  Pt very frustrated secondary to his continued seizures.  He states he would like to be transferred to [facility].  Objective: Vital signs in last 24 hours: Temp:  [97.6 F (36.4 C)-98.5 F (36.9 C)] 98.5 F (36.9 C) (11/06 0413) Pulse Rate:  [103-116] 103 (11/06 0413) Resp:  [18-20] 18 (11/06 0413) BP: (121-139)/(80-94) 139/94 mmHg (11/06 0413) SpO2:  [98 %-99 %] 98 % (11/06 0413) Last BM Date: 10/21/13  Intake/Output from previous day: 11/05 0701 - 11/06 0700 In: 2107.5 [P.O.:600; I.V.:1010; IV Piggyback:215; TPN:280] Out: 1765 [Urine:1750; Drains:15] Intake/Output this shift: Total I/O In: 120 [P.O.:120] Out: 400 [Urine:400]  PE: Gen:  Alert, NAD, pleasant Abd: Softer, less distended, nontender to palp, +BS, suprapubic and LLQ drain with serous drainage, LUQ (splenic drain) with tan thick drainage, no rebound or guarding   Lab Results:   Recent Labs  10/25/13 0510 10/26/13 0530  WBC 14.5* 11.9*  HGB 11.6* 10.5*  HCT 34.8* 31.8*  PLT 266 237   BMET  Recent Labs  10/25/13 0510 10/26/13 0530  NA 133* 136  K 4.4 4.0  CL 96 98  CO2 25 27  GLUCOSE 174* 147*  BUN 19 19  CREATININE 0.65 0.52  CALCIUM 9.1 8.6   PT/INR No results found for this basename: LABPROT, INR,  in the last 72 hours CMP     Component Value Date/Time   NA 136 10/26/2013 0530   NA 141 10/09/2013 1300   K 4.0 10/26/2013 0530   K 4.4 10/09/2013 1300   CL 98 10/26/2013 0530   CL 102 06/05/2013 1406   CO2 27 10/26/2013 0530   CO2 21* 10/09/2013 1300   GLUCOSE 147* 10/26/2013 0530   GLUCOSE 181* 10/09/2013 1300   GLUCOSE 165* 06/05/2013 1406   BUN 19 10/26/2013 0530   BUN 19.6 10/09/2013 1300   CREATININE 0.52 10/26/2013 0530   CREATININE 0.7 10/09/2013 1300   CALCIUM 8.6 10/26/2013 0530   CALCIUM 9.3 10/09/2013 1300   PROT 5.4* 10/26/2013 0530   PROT 6.9 10/09/2013 1300   ALBUMIN  2.4* 10/26/2013 0530   ALBUMIN 2.6* 10/09/2013 1300   AST 47* 10/26/2013 0530   AST 30 10/09/2013 1300   ALT 104* 10/26/2013 0530   ALT 124* 10/09/2013 1300   ALKPHOS 108 10/26/2013 0530   ALKPHOS 89 10/09/2013 1300   BILITOT 0.2* 10/26/2013 0530   BILITOT 0.35 10/09/2013 1300   GFRNONAA >90 10/26/2013 0530   GFRAA >90 10/26/2013 0530   Lipase     Component Value Date/Time   LIPASE 39 10/12/2013 1030       Studies/Results: Mr Laqueta Jean Wo Contrast  10/24/2013   CLINICAL DATA:  History of glioblastoma multiform and, new onset of recurrent seizures. Evaluate for tumor progression. History of tumor resection and radiotherapy.  EXAM: MRI HEAD WITHOUT AND WITH CONTRAST  TECHNIQUE: Multiplanar, multiecho pulse sequences of the brain and surrounding structures were obtained according to standard protocol without and with intravenous contrast  CONTRAST:  18mL MULTIHANCE GADOBENATE DIMEGLUMINE 529 MG/ML IV SOLN  COMPARISON:  Prior CT from 10/22/2013 and MRI from 09/08/2013.  FINDINGS: The post contrast sequences are somewhat degraded by motion artifact.  Previously identified left parietal mass is decreased in size now measuring approximately 4.9 x 2.9 x 3.4 cm (series 14, image 35). This lesion measured approximately 4.1 x 5.6 x 4.4 cm on prior study. Associated  vasogenic edema at these improved with decreased T2/FLAIR signal seen within the adjacent left parietal white matter. There is improved mass effect on the adjacent posterior horn of the left lateral ventricle. Restricted diffusion seen centrally with numbness mass is present, like related to necrosis and/ or postoperative changes. An irregular peripheral rim hemosiderin deposition and/ or blood products is present about the central cavity of this lesion. There is persistent irregular peripheral rim enhancement about the margins of the lesion, improved as compared to the prior exam.  Sequelae of left parietal craniotomy is seen overlying the mass.  There is no extra-axial fluid collection.  No other abnormal foci of restricted diffusion are seen to suggest acute intracranial infarct. Normal flow voids are seen within the intracranial vasculature. No other mass lesion is identified. No mass effect is now seen within the brain. No other abnormal enhancement identified.  IMPRESSION: 1. Interval decrease in size of left parietal mass with improved vasogenic edema and resolved midline shift as compared to prior MRI from 09/08/2013. Persistent peripheral rim enhancement about this lesion may represent residual tumor versus radiation necrosis. 2. No other acute intracranial process identified within the brain.   Electronically Signed   By: Rise Mu M.D.   On: 10/24/2013 16:50    Anti-infectives: Anti-infectives   Start     Dose/Rate Route Frequency Ordered Stop   10/25/13 1800  metroNIDAZOLE (FLAGYL) IVPB 500 mg     500 mg 100 mL/hr over 60 Minutes Intravenous Every 8 hours 10/25/13 1743     10/25/13 1800  cefTRIAXone (ROCEPHIN) 2 g in dextrose 5 % 50 mL IVPB     2 g 100 mL/hr over 30 Minutes Intravenous Every 24 hours 10/25/13 1743     10/24/13 1200  piperacillin-tazobactam (ZOSYN) IVPB 3.375 g  Status:  Discontinued     3.375 g 12.5 mL/hr over 240 Minutes Intravenous Every 8 hours 10/24/13 1114 10/25/13 1742   10/13/13 1800  fluconazole (DIFLUCAN) IVPB 200 mg     200 mg 100 mL/hr over 60 Minutes Intravenous Every 24 hours 10/13/13 1707 10/22/13 1906   10/13/13 1715  ertapenem (INVANZ) 1 g in sodium chloride 0.9 % 50 mL IVPB  Status:  Discontinued     1 g 100 mL/hr over 30 Minutes Intravenous Every 24 hours 10/13/13 1707 10/13/13 1750   10/12/13 1600  fluconazole (DIFLUCAN) tablet 100 mg  Status:  Discontinued     100 mg Oral Daily 10/12/13 1428 10/13/13 1707   10/12/13 1600  sulfamethoxazole-trimethoprim (BACTRIM DS) 800-160 MG per tablet 1 tablet  Status:  Discontinued     1 tablet Oral Daily 10/12/13 1428 10/13/13 1707    10/12/13 1600  ertapenem (INVANZ) 1 g in sodium chloride 0.9 % 50 mL IVPB  Status:  Discontinued     1 g 100 mL/hr over 30 Minutes Intravenous Every 24 hours 10/12/13 1507 10/24/13 1107       Assessment/Plan Ileus from perf bowel (presumed diverticular) resolving s/p Diagnostic Lap with drain placements POD #13 1. Abscess collections significantly improved on 10/23/13 CT scan, clinically improved  2. Appreciate ID's recommendations, can start oral flagyl as ileus has been resolved for several days, continue ceftriaxone 2gm for 2-4 weeks depending on repeat imaging of abd fluid collections 3. PT/OT due to fall risk and seizure risk to help with mobilization 4. SCD's, lovenox, encouraged IS  5. Continue all 3 drains at this time, can be taken out at a later time a few weeks down the  road 6. WBC much improved today at 11.9 7. Cont. bland diet, okay to start weaning TPN with supplements on board, talked to pharmacy about d/c 8. Good bowel regimen  9. Sister and patient have asked to be transferred to [facility], if transferred can be seen by our [facility] team.  Besides the seizures would be ready for discharge from our perspective on 2-4 weeks of antibiotics, and CT/office followup, but the seizures have prevented his discharge.  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

## 2013-10-26 NOTE — Progress Notes (Signed)
Brief TNA Notes from Pharmacy  72 yoM with diverticulitis of colon with perforation s/p diagnostic laparoscopy on 10/24 with washout but perforation was not identified.  Pt was started on TNA for prolonged ileus 10/27.  Diet advanced slowly.   11/6: Today is D#11 of TNA.  Patient is tolerating bland diet. RD has been performing calorie count on patient and added snack and Ensure Complete BID. Pt refused Ensure Complete dose last night. Recurrent seizures noted, neurology on board. Surgery orders to wean TNA to off tonight. Possible transfer to [facility] per pt's request.   Plan: Continue TNA at current rate of 40 ml/hr and turn to off tonight at [facility] or at transfer to [facility]. Will change SSI and CBG checks to AC&HS, sensitive scale as pt on high dose Decadron. D/C all TNA related labs.   Geoffry Paradise, PharmD, BCPS Pager: [phone] 10:50 AM Pharmacy #: [phone]

## 2013-10-27 LAB — GLUCOSE, CAPILLARY
Glucose-Capillary: 117 mg/dL — ABNORMAL HIGH (ref 70–99)
Glucose-Capillary: 171 mg/dL — ABNORMAL HIGH (ref 70–99)
Glucose-Capillary: 200 mg/dL — ABNORMAL HIGH (ref 70–99)
Glucose-Capillary: 170 mg/dL — ABNORMAL HIGH (ref 70–99)

## 2013-10-27 MED ORDER — SODIUM CHLORIDE 0.9 % IJ SOLN
10.0000 mL | Freq: Two times a day (BID) | INTRAMUSCULAR | Status: DC
  Administered 2013-10-27 – 2013-11-02 (×4): 10 mL

## 2013-10-27 MED ORDER — ZOLPIDEM TARTRATE 5 MG PO TABS
5.0000 mg | ORAL_TABLET | Freq: Every evening | ORAL | Status: DC | PRN
  Administered 2013-10-27 – 2013-11-01 (×5): 5 mg via ORAL
  Filled 2013-10-27 (×5): qty 1

## 2013-10-27 MED ORDER — SODIUM CHLORIDE 0.9 % IJ SOLN
10.0000 mL | INTRAMUSCULAR | Status: DC | PRN
  Administered 2013-10-27: 20 mL
  Administered 2013-10-28 (×2): 10 mL
  Administered 2013-10-29: 20 mL
  Administered 2013-10-30 – 2013-11-02 (×8): 10 mL

## 2013-10-27 MED ORDER — DEXAMETHASONE 4 MG PO TABS
4.0000 mg | ORAL_TABLET | Freq: Three times a day (TID) | ORAL | Status: DC
  Administered 2013-10-27 – 2013-10-31 (×14): 4 mg via ORAL
  Filled 2013-10-27 (×20): qty 1

## 2013-10-27 MED ORDER — OXYCODONE HCL 20 MG/ML PO CONC
10.0000 mg | ORAL | Status: DC | PRN
  Administered 2013-10-27: 15 mg via ORAL
  Filled 2013-10-27: qty 1

## 2013-10-27 MED ORDER — TOPIRAMATE 25 MG PO TABS
50.0000 mg | ORAL_TABLET | Freq: Two times a day (BID) | ORAL | Status: DC
  Administered 2013-10-27 – 2013-11-02 (×13): 50 mg via ORAL
  Filled 2013-10-27 (×14): qty 2

## 2013-10-27 NOTE — Progress Notes (Signed)
NEURO HOSPITALIST PROGRESS NOTE   SUBJECTIVE:                                                                                                                        Patient is awake and able to tell me he is in the hospital. HE does have some problems with word substitution. HE has been having multiple seizures last night all lasting about "1-2 minutes" per wife. This AM he has had less number of seizures.   OBJECTIVE:                                                                                                                           Vital signs in last 24 hours: Temp:  [97.3 F (36.3 C)-98.2 F (36.8 C)] 98.1 F (36.7 C) (11/07 1010) Pulse Rate:  [94-129] 129 (11/07 1010) Resp:  [16-18] 18 (11/07 1010) BP: (115-150)/(76-90) 134/82 mmHg (11/07 1010) SpO2:  [94 %-98 %] 96 % (11/07 1010) Weight:  [88.8 kg (195 lb 12.3 oz)] 88.8 kg (195 lb 12.3 oz) (11/06 2100)  Intake/Output from previous day: 11/06 0701 - 11/07 0700 In: 737 [P.O.:600; IV Piggyback:125] Out: 1755 [Urine:1750; Drains:5] Intake/Output this shift: Total I/O In: 120 [P.O.:120] Out: -  Nutritional status: [employer]  Past Medical History  Diagnosis Date  . Hyperlipidemia   . Right bundle branch block     Normal LV function normal aortic and mitral valve bedside echocardiogram 4 2012  . Tachycardia     Resolved  . Dyslipidemia   . Coronary artery disease (CAD) excluded     Cardiolite study 2006 within normal limits  . Hypertension   . Allergy   . Cancer       Neurologic Exam:  Mental Status: Alert, oriented to hospital.  Speech fluent with some word substitution.  Able to follow simple step commands but does have difficulty with complex commands.  Cranial Nerves: II:  Visual fields grossly normal, pupils equal, round, reactive to light and accommodation III,IV, VI: ptosis not present, extra-ocular motions intact bilaterally V,VII: smile symmetric, facial light touch  sensation normal bilaterally VIII: hearing normal bilaterally IX,X: gag reflex present XI: bilateral shoulder shrug XII: midline tongue extension without atrophy or fasciculations  Motor: Right :  Upper extremity   5/5    Left:     Upper extremity   5/5  Lower extremity   5/5     Lower extremity   5/5 Tone and bulk:normal tone throughout; no atrophy noted Sensory: Pinprick and light touch intact throughout, bilaterally Deep Tendon Reflexes:  Right: Upper Extremity   Left: Upper extremity   biceps (C-5 to C-6) 2/4   biceps (C-5 to C-6) 2/4 tricep (C7) 2/4    triceps (C7) 2/4 Brachioradialis (C6) 2/4  Brachioradialis (C6) 2/4  Lower Extremity Lower Extremity  quadriceps (L-2 to L-4)3/4   quadriceps (L-2 to L-4) 3/4 Achilles (S1) 2/4   Achilles (S1) 2/4  Plantars: Right: downgoing   Left: downgoing    Lab Results: No results found for this basename: cbc, bmp, coags, chol, tri, ldl, hga1c   Lipid Panel No results found for this basename: CHOL, TRIG, HDL, CHOLHDL, VLDL, LDLCALC,  in the last 72 hours  Studies/Results: No results found.  MEDICATIONS                                                                                                                        Scheduled: . antiseptic oral rinse  15 mL Mouth Rinse q12n4p  . calcium carbonate  1 tablet Oral Daily  . cefTRIAXone (ROCEPHIN)  IV  2 g Intravenous Q24H  . chlorhexidine  15 mL Mouth Rinse BID  . cloNIDine  0.1 mg Transdermal Weekly  . dexamethasone  4 mg Oral Q8H  . enoxaparin (LOVENOX) injection  40 mg Subcutaneous Q24H  . heparin flush  250 Units Intracatheter Once  . insulin aspart  0-9 Units Subcutaneous TID WC  . lacosamide (VIMPAT) IV  200 mg Intravenous Q12H  . levETIRAcetam  1,500 mg Intravenous Q12H  . lip balm   Topical BID  . metoprolol  10 mg Intravenous Q6H  . metroNIDAZOLE  500 mg Oral Q8H  . multivitamin with minerals  1 tablet Oral Daily  . pantoprazole  40 mg Oral BID  . polyethylene  glycol  17 g Oral Daily  . saccharomyces boulardii  250 mg Oral BID    ASSESSMENT/PLAN:                                                                                                            Seizure:  Patient continues to have seizures but less frequent this AM.  Have spoken to Sister.  We will add low dose Topamax 50 mg BID at this time. Patient has been getting Ativan  for seizures also.  Would reserve ativan for prolonged seizures or seizures in which are multiple and he is not returning to baseline.   Recommend: 1) Continue Keppra and Vimpat 2) Will add Topamax 50 mg BID PO  Assessment and plan discussed with with attending physician and they are in agreement.    Felicie Morn PA-C [facility] [phone]  10/27/2013, 11:25 AM   Patient seen and examined together with physician assistant and I concur with the assessment and plan.  Wyatt Portela, MD

## 2013-10-27 NOTE — Progress Notes (Signed)
[facility]                                                                                Patient Demographics  Martin Martin, is a 57 y.o. male, DOB - Aug 25, 1956, OZH:[id number]  Admit date - 10/12/2013   Admitting Physician Ernestene Mention, MD  Outpatient Primary MD for the patient is [name], MD  LOS - 15   Chief Complaint  Patient presents with  . 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 Myna Hidalgo (Oncology) -Currently on Keppra 1500 mg twice a day as well as the impact 200 mg twice a day and Ativan PRN.  Awaiting further recommendations from neurology. -Currently on Decadron 4 mg IV every 6 hours for edema in the brain.  MRI 11/4 confirms Interval decrease in size of left parietal mass with improved vasogenic edema and resolved midline shift as compared to prior MRI.   Diverticulitis of colon with perforation.  -POD 14 s/p diagnostic laparoscopy and placement of drains -Currently being followed by surgery -body fluid cultures: +Ecoli -patient was started on ertapenem and swiched to Zosyn.   -Infectious disease is also following.  -Patient currently on Ceftrixone 2mg  IV daily and flagyl 500mg  TID.   Patient will need 2-4 weeks of antibiotics.  Can be switched to oral flagyl once ileus has resolved. -Patient had NG tube which was taken out on 1029. Patient was started on clear liquid diet. Surgery is following.   Hyperglycemia-  -Ssecondary to steroids, will continue on Sliding scale insulin.   Hypertension -continue metoprolol 10 mg IV every 6 hours and Catapres patch.  -Will switch to oral medications once patient seizures are controlled.   Leukocytosis- -Likely secondary to steroids,  patient remains afebrile  Oral thrush -Completed course of Diflucan  Code Status: DNR Family Communication: None at bedside.  Will try to contact spouse. Disposition Plan: Admitted.  CM and SW looking at [facility]  Procedures  Diagnostic laparoscopy  Consults   Surgery Neurology Oncology Palliative care Infectious disease  DVT Prophylaxis  Lovenox   Lab Results  Component Value Date   PLT 237 10/26/2013    Medications  Scheduled Meds: . antiseptic oral rinse  15 mL Mouth Rinse q12n4p  . calcium carbonate  1 tablet Oral Daily  . cefTRIAXone (ROCEPHIN)  IV  2 g Intravenous Q24H  . chlorhexidine  15 mL Mouth Rinse BID  . cloNIDine  0.1 mg Transdermal Weekly  . dexamethasone  4 mg Oral Q8H  . enoxaparin (LOVENOX) injection  40 mg Subcutaneous Q24H  . heparin flush  250 Units Intracatheter Once  . insulin aspart  0-9 Units Subcutaneous TID WC  . lacosamide (VIMPAT) IV  200 mg Intravenous Q12H  . levETIRAcetam  1,500 mg Intravenous Q12H  . lip balm   Topical BID  . metoprolol  10 mg Intravenous Q6H  . metroNIDAZOLE  500 mg Oral Q8H  .  multivitamin with minerals  1 tablet Oral Daily  . pantoprazole  40 mg Oral BID  . polyethylene glycol  17 g Oral Daily  . saccharomyces boulardii  250 mg Oral BID   Continuous Infusions: . 0.9 % NaCl with KCl 20 mEq / L 45 mL/hr at 10/26/13 0500   PRN Meds:.acetaminophen, acetaminophen, alum & mag hydroxide-simeth, diphenhydrAMINE, fentaNYL, hydrALAZINE, LORazepam, magic mouthwash, ondansetron (ZOFRAN) IV, ondansetron, oxyCODONE  Antibiotics    Anti-infectives   Start     Dose/Rate Route Frequency Ordered Stop   10/26/13 1400  metroNIDAZOLE (FLAGYL) tablet 500 mg     500 mg Oral 3 times per day 10/26/13 0945     10/25/13 1800  metroNIDAZOLE (FLAGYL) IVPB 500 mg  Status:  Discontinued     500 mg 100 mL/hr over 60 Minutes Intravenous Every 8 hours 10/25/13 1743 10/26/13 0945   10/25/13 1800  cefTRIAXone (ROCEPHIN) 2 g in dextrose 5 % 50  mL IVPB     2 g 100 mL/hr over 30 Minutes Intravenous Every 24 hours 10/25/13 1743     10/24/13 1200  piperacillin-tazobactam (ZOSYN) IVPB 3.375 g  Status:  Discontinued     3.375 g 12.5 mL/hr over 240 Minutes Intravenous Every 8 hours 10/24/13 1114 10/25/13 1742   10/13/13 1800  fluconazole (DIFLUCAN) IVPB 200 mg     200 mg 100 mL/hr over 60 Minutes Intravenous Every 24 hours 10/13/13 1707 10/22/13 1906   10/13/13 1715  ertapenem (INVANZ) 1 g in sodium chloride 0.9 % 50 mL IVPB  Status:  Discontinued     1 g 100 mL/hr over 30 Minutes Intravenous Every 24 hours 10/13/13 1707 10/13/13 1750   10/12/13 1600  fluconazole (DIFLUCAN) tablet 100 mg  Status:  Discontinued     100 mg Oral Daily 10/12/13 1428 10/13/13 1707   10/12/13 1600  sulfamethoxazole-trimethoprim (BACTRIM DS) 800-160 MG per tablet 1 tablet  Status:  Discontinued     1 tablet Oral Daily 10/12/13 1428 10/13/13 1707   10/12/13 1600  ertapenem (INVANZ) 1 g in sodium chloride 0.9 % 50 mL IVPB  Status:  Discontinued     1 g 100 mL/hr over 30 Minutes Intravenous Every 24 hours 10/12/13 1507 10/24/13 1107       Time Spent in minutes   35 minutes   Chauntae Hults D.O. on 10/27/2013 at 10:58 AM  Between 7am to 7pm - Pager - [phone]  After 7pm go to [email or web address] - password [email or web address]  And look for the night coverage person covering for me after hours  [facility] Office  [phone]    Subjective:   Martin Martin seen and examined today.  Patient has no complaints today. He understands how great his health is at this time. He does wish to be DO NOT RESUSCITATE. Patient denies dizziness, chest pain, shortness of breath, abdominal pain, N/V/D/C, new weakness, numbess, tingling.    Objective:   Filed Vitals:   10/26/13 2100 10/27/13 0200 10/27/13 0632 10/27/13 1010  BP: 129/76 150/90 144/82 134/82  Pulse: 105 95 94 129  Temp: 97.7 F (36.5 C) 97.4 F (36.3 C) 97.3 F (36.3 C) 98.1 F (36.7 C)   TempSrc: Oral Oral Oral Oral  Resp: 16 18 18 18   Height: 6' (1.829 m)     Weight: 88.8 kg (195 lb 12.3 oz)     SpO2: 94% 96% 97% 96%    Wt Readings from Last 3 Encounters:  10/26/13 88.8 kg (195 lb 12.3 oz)  10/26/13 88.8 kg (195 lb 12.3 oz)  10/26/13 88.8 kg (195 lb 12.3 oz)     Intake/Output Summary (Last 24 hours) at 10/27/13 1058 Last data filed at 10/27/13 0800  Gross per 24 hour  Intake    737 ml  Output   1355 ml  Net   -618 ml    Exam  General: Well developed, well nourished, NAD, appears stated age  HEENT: NCAT, PERRLA, mucous membranes moist.   Neck: Supple, no JVD, no masses  Cardiovascular: S1 S2 auscultated, Regular rate and rhythm.  Respiratory: Clear to auscultation bilaterally with equal chest rise  Abdomen: Soft, nontender, distended, + bowel sounds, hypoactive  Extremities: warm dry without cyanosis clubbing or edema  Neuro: AAOx3, Focal deficits  Skin: Without rashes exudates or nodules  Psych: Normal affect and demeanor with intact judgement and insight  Data Review   Micro Results Recent Results (from the past 240 hour(s))  URINE CULTURE     Status: None   Collection Time    10/20/13  6:30 AM      Result Value Range Status   Specimen Description URINE, CLEAN CATCH   Final   Special Requests NONE   Final   Culture  Setup Time     Final   Value: 10/20/2013 09:23     Performed at [facility] Count     Final   Value: NO GROWTH     Performed at [facility]   Culture     Final   Value: NO GROWTH     Performed at [facility]   Report Status 10/21/2013 FINAL   Final    Radiology Reports Ct Head Wo Contrast  10/22/2013   CLINICAL DATA:  Evaluate for recent seizures. Glioblastoma status post surgery and radiotherapy.  EXAM: CT HEAD WITHOUT CONTRAST  TECHNIQUE: Contiguous axial images were obtained from the base of the skull through the vertex without contrast.  COMPARISON:  Metabolic brain scan  09/26/2013. MRI brain 09/08/13.  FINDINGS: Left parietal hypodensity at the surgical site with moderate surrounding vasogenic edema. No significant midline shift for impending herniation. As seen on image 23 cross-sectional measurements of 26 x 28 mm. There is slightly less mass effect when compared with prior MRI with regard to regional vasogenic edema. There is no superimposed hemorrhage, midline shift, or new intracranial abnormalities. Unremarkable appearing craniotomy defect.  IMPRESSION: Left parietal lesion appears stable to slightly improved with regard to mass effect and surrounding edema. No new abnormalities are detected on this noncontrast exam. 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   Electronically Signed   By: Davonna Belling M.D.   On: 10/22/2013 14:41   Mr Laqueta Jean WJ Contrast  10/24/2013   CLINICAL DATA:  History of glioblastoma multiform and, new onset of recurrent seizures. Evaluate for tumor progression. History of tumor resection and radiotherapy.  EXAM: MRI HEAD WITHOUT AND WITH CONTRAST  TECHNIQUE: Multiplanar, multiecho pulse sequences of the brain and surrounding structures were obtained according to standard protocol without and with intravenous contrast  CONTRAST:  18mL MULTIHANCE GADOBENATE DIMEGLUMINE 529 MG/ML IV SOLN  COMPARISON:  Prior CT from 10/22/2013 and MRI from 09/08/2013.  FINDINGS: The post contrast sequences are somewhat degraded by motion artifact.  Previously identified left parietal mass is decreased in size now measuring approximately 4.9 x 2.9 x 3.4 cm (series 14, image 35). This lesion measured approximately 4.1 x 5.6 x 4.4 cm on prior study.  Associated vasogenic edema at these improved with decreased T2/FLAIR signal seen within the adjacent left parietal white matter. There is improved mass effect on the adjacent posterior horn of the left lateral ventricle. Restricted diffusion seen centrally with numbness mass is  present, like related to necrosis and/ or postoperative changes. An irregular peripheral rim hemosiderin deposition and/ or blood products is present about the central cavity of this lesion. There is persistent irregular peripheral rim enhancement about the margins of the lesion, improved as compared to the prior exam.  Sequelae of left parietal craniotomy is seen overlying the mass. There is no extra-axial fluid collection.  No other abnormal foci of restricted diffusion are seen to suggest acute intracranial infarct. Normal flow voids are seen within the intracranial vasculature. No other mass lesion is identified. No mass effect is now seen within the brain. No other abnormal enhancement identified.  IMPRESSION: 1. Interval decrease in size of left parietal mass with improved vasogenic edema and resolved midline shift as compared to prior MRI from 09/08/2013. Persistent peripheral rim enhancement about this lesion may represent residual tumor versus radiation necrosis. 2. No other acute intracranial process identified within the brain.   Electronically Signed   By: Rise Mu M.D.   On: 10/24/2013 16:50   Ct Abdomen Pelvis W Contrast  10/23/2013   CLINICAL DATA:  Abdominal pain. Diverticulitis with colonic perforation.  EXAM: CT ABDOMEN AND PELVIS WITH CONTRAST  TECHNIQUE: Multidetector CT imaging of the abdomen and pelvis was performed using the standard protocol following bolus administration of intravenous contrast.  CONTRAST:  OMNIPAQUE IOHEXOL 300 MG/ML  SOLN  COMPARISON:  10/16/2013  FINDINGS: Atelectasis is identified within both lung bases. No focal liver abnormality identified. The gallbladder appears normal. No biliary dilatation. Normal appearance of the pancreas. The spleen is unremarkable.  The adrenal glands are both normal. Normal appearance of the right kidney. Left renal cyst is again identified. Nonobstructing calculus within the inferior pole of the left kidney measures 5 mm,  image 51/ series 2. The urinary bladder appears within normal limits. Prostate gland and seminal vesicles are negative.  Normal caliber of the abdominal aorta. No upper abdominal adenopathy noted. There is no pelvic or inguinal adenopathy.  The stomach appears normal. Small bowel loops appear mildly increased in caliber compatible with the clinical history of ileus. Again noted is extra luminal gas within the soft tissues of the left upper quadrant of the abdomen. Gas can be seen tracking around the stomach and into the posterior mediastinum. This appears similar to previous exam.  Multiple fluid collections are again noted within the upper abdomen. Index fluid collection ventral to the duodenum measures 4.1 cm, image 45/ series 2. Previously 3.7 cm. Index fluid collection posterior to the descending colon measures 4.8 cm, image 50/ series 2. This is compared with 5.6 cm previously. Fluid collection within the root of the mesenteric measures 4.1 x 1.9 cm, image 53/ series 2. Previously 5.2 x 2.8 cm. No new fluid collections identified. Similar appearance of percutaneous to drainage catheters within the central portion of the lower abdomen.  Review of the visualized osseous structures is significant for lumbar spondylosis. No aggressive lytic or sclerotic bone lesions.  IMPRESSION: 1. Multi focal fluid collections are again noted within the abdomen. When compared with the previous exam these are slightly decreased in size in the interval. No new or enlarging fluid collections identified.  2. Mild increased caliber of the small bowel loops compatible with ileus. No specific features  identified to suggest high-grade bowel obstruction.  3. Again noted is extraluminal gas within the soft tissues of the left upper quadrant of the abdomen and extending into the posterior mediastinum. Findings a compatible with known perforated viscus.   Electronically Signed   By: Signa Kell M.D.   On: 10/23/2013 15:35   Ct Abdomen  Pelvis W Contrast  10/16/2013   CLINICAL DATA:  Chronic perforation post surgery and drain placement, abdominal distention, question persistent extraluminal gas  EXAM: CT ABDOMEN AND PELVIS WITH CONTRAST  TECHNIQUE: Multidetector CT imaging of the abdomen and pelvis was performed using the standard protocol following bolus administration of intravenous contrast. Sagittal and coronal MPR images reconstructed from axial data set.  CONTRAST:  50mL OMNIPAQUE IOHEXOL 300 MG/ML SOLN, OMNIPAQUE IOHEXOL 300 MG/ML SOLN  COMPARISON:  10/12/2013  FINDINGS: Bibasilar atelectasis and minimal left pleural effusion.  Nasogastric tube extends into the 2nd portion of duodenum.  Multiple surgical drains present in abdomen, new.  Focus of air within urinary bladder likely reflects prior catheterization.  Mild fatty infiltration of liver.  4 mm nonobstructing calculus inferior pole left kidney image 45.  Liver, spleen, pancreas, and adrenal glands unremarkable.  Symmetric nephrograms with again identified 3.7 x 3.4 cm left renal cysts.  Extraluminal gas is identified under the left hemidiaphragm, within the mesenteric, an adjacent to the colon from the distal transverse the proximal descending segments consistent with prior colonic perforation.  Again identified thickening of a small bowel loop in the mid abdomen with a small adjacent fluid collection which measures 5.2 x 2.8 cm image 45, previously 5.1 x 3.2 cm.  Additional gas and fluid collection adjacent to the proximal descending colon, 5.6 x 3.8 cm image 43 previously 5.2 x 3.7 cm.  Additional fluid mesenteric fluid collections inferior to the pancreatic head appear stable.  Scattered diverticula of the descending and proximal sigmoid colon.  No evidence of bowel obstruction, with GI contrast present into the ascending colon.  Unremarkable bladder and ureters.  Remaining both bowel loops normal appearance.  No acute osseous findings.  Surgical clips and infiltration at  umbilicus likely reflect interval laparoscopy with note of a tiny umbilical hernia.  IMPRESSION: Again seen foci of extraluminal gas compatible with colonic perforation similar to previous exam.  Interval placement of surgical drains.  Persistent focal collections of fluid with minimal gas are seen adjacent to the ascending colon (minimally larger), inferior to pancreatic head (generally stable), and adjacent to a thickened small bowel loop in the mid abdomen (little changed).  Thickened small bowel loop could be due to ischemia, infection, or inflammatory bowel disease.  No definite new intra-abdominal or intrapelvic abnormalities identified.   Electronically Signed   By: Ulyses Southward M.D.   On: 10/16/2013 18:44   Ct Abdomen Pelvis W Contrast  10/12/2013   CLINICAL DATA:  Severe abdominal pain. History of CNS glioblastoma.  EXAM: CT ABDOMEN AND PELVIS WITH CONTRAST  TECHNIQUE: Multidetector CT imaging of the abdomen and pelvis was performed using the standard protocol following bolus administration of intravenous contrast.  CONTRAST:  OMNIPAQUE IOHEXOL 300 MG/ML  SOLN  COMPARISON:  10/11/2009  FINDINGS: Linear densities at the right lung base are suggestive for atelectasis. There is free intraperitoneal air in the left upper quadrant with air around the distal esophagus. There appears to be some air within the retroperitoneal space. The largest focus of air is around the descending colon and there is fluid or inflammation in this area. The fluid  collection around the descending colon roughly measures 5.2 x 3.7 cm and could represent a site for abscess formation. Findings raise concern for acute diverticulitis in this area. Patient has extensive colonic diverticula. In addition, there is a loop of small bowel in the mid lower abdomen that demonstrates severe wall thickening measuring up to 1.1 cm. There is fluid and edema tracking from this abnormal loop of bowel into the mesentery above it. There is some  fluid and small nodes throughout the central mesentery and adjacent to the duodenum.  No gross abnormality to the liver, gallbladder or portal venous system. No gross abnormality of the pancreas, spleen, adrenal glands. There is mild perinephric stranding or edema around the kidneys which appears chronic. Probable bilateral renal cysts. Calcification in the left kidney lower pole measures 4 mm and suggestive for nonobstructive stone.  No gross abnormality to the prostate, seminal vesicles or urinary bladder. No significant free fluid in the pelvis. No acute bone abnormality.  IMPRESSION: Study is positive for free air and suggestive for a bowel perforation. The largest focus of air is surrounding the descending colon. Patient has multiple colonic diverticula and findings could represent acute diverticulitis. There is fluid around the descending colon which may represent a potential site for abscess formation.  There is a severely thickened loop of small bowel in the mid abdomen. There is extensive edema and fluid tracking superior to this abnormal loop of bowel and towards the central mesentery. The patient is currently receiving chemotherapy and Avastin. Findings could represent a focal area of bowel ischemia from vasculitis. Findings may also associated with an infectious or inflammatory process.  Nonobstructive left kidney stone.  Renal cysts.  These results were called by telephone at the time of interpretation on 10/12/2013 at 12:27 PM to Dr. Rolland Porter , who verbally acknowledged these results.   Electronically Signed   By: Richarda Overlie M.D.   On: 10/12/2013 12:30   Dg Abd 2 Views  10/16/2013   CLINICAL DATA:  Distended abdomen. Status post laparoscopy for possible perforation, no perforation identified at time of surgery.  EXAM: ABDOMEN - 2 VIEW  COMPARISON:  CT of the abdomen and pelvis 10/12/2013.  FINDINGS: Nasogastric tube extends into the proximal stomach, with side port just distal to the  gastroesophageal junction. There is some oral contrast material in the ascending colon and cecum. Gas is noted throughout the colon and there is a small amount of gas in nondilated loops of small bowel. A surgical drain is noted in the pelvis, and skin staples are seen projecting over the left side of the abdomen. Notably, there are some irregular-shaped locular some gas projecting over the left upper quadrant of the abdomen, which are not clearly within either the colon or the stomach, suspicious for residual retroperitoneal gas as demonstrated on prior CT scan 10/12/2013.  IMPRESSION: 1. Probable persistent gas in the upper left retroperitoneum, suspicious for retroperitoneal perforation from the descending colon, as suggested on prior CT scan 10/12/2013. Given the recent negative laparoscopy, but persistently distended abdomen, further evaluation with repeat contrast-enhanced CT of the abdomen and pelvis may provide additional diagnostic information if clinically indicated. 2. Postoperative changes and support apparatus, as above. These results were called by telephone at the time of interpretation on 10/16/2013 at 1:49 PM to Dr. Lodema Pilot , who verbally acknowledged these results.   Electronically Signed   By: Trudie Reed M.D.   On: 10/16/2013 13:54    CBC  Recent Labs Lab 10/22/13  0530 10/23/13 0525 10/25/13 0510 10/26/13 0530  WBC 14.7* 14.2* 14.5* 11.9*  HGB 11.4* 11.1* 11.6* 10.5*  HCT 34.2* 33.2* 34.8* 31.8*  PLT 261 257 266 237  MCV 97.7 97.9 98.6 98.8  MCH 32.6 32.7 32.9 32.6  MCHC 33.3 33.4 33.3 33.0  RDW 16.4* 16.7* 16.9* 16.6*  LYMPHSABS  --  0.7  --   --   MONOABS  --  1.0  --   --   EOSABS  --  0.0  --   --   BASOSABS  --  0.0  --   --     Chemistries   Recent Labs Lab 10/22/13 0530 10/23/13 0525 10/24/13 0605 10/25/13 0510 10/26/13 0530  NA 134* 136 136 133* 136  K 3.7 3.8 3.9 4.4 4.0  CL 98 99 99 96 98  CO2 26 26 27 25 27   GLUCOSE 164* 141* 141* 174*  147*  BUN 15 17 15 19 19   CREATININE 0.55 0.53 0.51 0.65 0.52  CALCIUM 9.0 8.7 9.0 9.1 8.6  MG  --  1.8  --   --  1.9  AST  --  30  --   --  47*  ALT  --  66*  --   --  104*  ALKPHOS  --  122*  --   --  108  BILITOT  --  0.2*  --   --  0.2*   ------------------------------------------------------------------------------------------------------------------ estimated creatinine clearance is 111.8 ml/min (by C-G formula based on Cr of 0.52). ------------------------------------------------------------------------------------------------------------------ No results found for this basename: HGBA1C,  in the last 72 hours ------------------------------------------------------------------------------------------------------------------ No results found for this basename: CHOL, HDL, LDLCALC, TRIG, CHOLHDL, LDLDIRECT,  in the last 72 hours ------------------------------------------------------------------------------------------------------------------ No results found for this basename: TSH, T4TOTAL, FREET3, T3FREE, THYROIDAB,  in the last 72 hours ------------------------------------------------------------------------------------------------------------------ No results found for this basename: VITAMINB12, FOLATE, FERRITIN, TIBC, IRON, RETICCTPCT,  in the last 72 hours  Coagulation profile No results found for this basename: INR, PROTIME,  in the last 168 hours  No results found for this basename: DDIMER,  in the last 72 hours  Cardiac Enzymes No results found for this basename: CK, CKMB, TROPONINI, MYOGLOBIN,  in the last 168 hours ------------------------------------------------------------------------------------------------------------------ No components found with this basename: POCBNP,

## 2013-10-27 NOTE — Progress Notes (Signed)
Several  Seizure acivities noted overnight lasting between 10 secs to 80 secs. Ativan given @0546  d/t more frequent seizure activities. Will continue to monitor.

## 2013-10-27 NOTE — Progress Notes (Signed)
14 Days Post-Op  Subjective: Tolerating po and having BM's  Objective: Vital signs in last 24 hours: Temp:  [97.3 F (36.3 C)-98.2 F (36.8 C)] 97.3 F (36.3 C) (11/07 8119) Pulse Rate:  [94-110] 94 (11/07 0632) Resp:  [16-18] 18 (11/07 1478) BP: (115-150)/(76-90) 144/82 mmHg (11/07 0632) SpO2:  [94 %-98 %] 97 % (11/07 2956) Weight:  [195 lb 12.3 oz (88.8 kg)] 195 lb 12.3 oz (88.8 kg) (11/06 2100) Last BM Date: 10/26/13  Intake/Output from previous day: 11/06 0701 - 11/07 0700 In: 737 [P.O.:600; IV Piggyback:125] Out: 1755 [Urine:1750; Drains:5] Intake/Output this shift: Total I/O In: 120 [P.O.:120] Out: -   Abdomen is soft.  Only 1 drain is having purulent drainage.  Umbilical incision healed  Lab Results:   Recent Labs  10/25/13 0510 10/26/13 0530  WBC 14.5* 11.9*  HGB 11.6* 10.5*  HCT 34.8* 31.8*  PLT 266 237   BMET  Recent Labs  10/25/13 0510 10/26/13 0530  NA 133* 136  K 4.4 4.0  CL 96 98  CO2 25 27  GLUCOSE 174* 147*  BUN 19 19  CREATININE 0.65 0.52  CALCIUM 9.1 8.6   PT/INR No results found for this basename: LABPROT, INR,  in the last 72 hours ABG No results found for this basename: PHART, PCO2, PO2, HCO3,  in the last 72 hours  Studies/Results: No results found.  Anti-infectives: Anti-infectives   Start     Dose/Rate Route Frequency Ordered Stop   10/26/13 1400  metroNIDAZOLE (FLAGYL) tablet 500 mg     500 mg Oral 3 times per day 10/26/13 0945     10/25/13 1800  metroNIDAZOLE (FLAGYL) IVPB 500 mg  Status:  Discontinued     500 mg 100 mL/hr over 60 Minutes Intravenous Every 8 hours 10/25/13 1743 10/26/13 0945   10/25/13 1800  cefTRIAXone (ROCEPHIN) 2 g in dextrose 5 % 50 mL IVPB     2 g 100 mL/hr over 30 Minutes Intravenous Every 24 hours 10/25/13 1743     10/24/13 1200  piperacillin-tazobactam (ZOSYN) IVPB 3.375 g  Status:  Discontinued     3.375 g 12.5 mL/hr over 240 Minutes Intravenous Every 8 hours 10/24/13 1114 10/25/13 1742   10/13/13 1800  fluconazole (DIFLUCAN) IVPB 200 mg     200 mg 100 mL/hr over 60 Minutes Intravenous Every 24 hours 10/13/13 1707 10/22/13 1906   10/13/13 1715  ertapenem (INVANZ) 1 g in sodium chloride 0.9 % 50 mL IVPB  Status:  Discontinued     1 g 100 mL/hr over 30 Minutes Intravenous Every 24 hours 10/13/13 1707 10/13/13 1750   10/12/13 1600  fluconazole (DIFLUCAN) tablet 100 mg  Status:  Discontinued     100 mg Oral Daily 10/12/13 1428 10/13/13 1707   10/12/13 1600  sulfamethoxazole-trimethoprim (BACTRIM DS) 800-160 MG per tablet 1 tablet  Status:  Discontinued     1 tablet Oral Daily 10/12/13 1428 10/13/13 1707   10/12/13 1600  ertapenem (INVANZ) 1 g in sodium chloride 0.9 % 50 mL IVPB  Status:  Discontinued     1 g 100 mL/hr over 30 Minutes Intravenous Every 24 hours 10/12/13 1507 10/24/13 1107      Assessment/Plan: s/p Procedure(s): DIAGNOSTIC LAPAROSCOPY and Placement of Drains (N/A)  Remove staples Will start removing drains tomorrow  LOS: 15 days    Haniah Penny A 10/27/2013

## 2013-10-27 NOTE — Progress Notes (Signed)
Physical Therapy Treatment Patient Details Name: Martin Martin MRN: [id number] DOB: Apr 11, 1956 Today's Date: 10/27/2013 Time: 4098-1191 PT Time Calculation (min): 18 min  PT Assessment / Plan / Recommendation  History of Present Illness Pt admitted w/ dx: Diverticulitis of colon w/ perforation, abdominal pain. Pt w/ h/o Glioblastoma left parietal brain, status post biopsy/debulking in April 2014 followed by radiation and adjuvant chemotherapy consisting of Temodar, currently being managed with Avastin every 2 weeks. He subsequently was started on steroids secondary to progression of disease with right-sided weakness in September 2014. As of 10/24/13, RN staff, pt/family are reporting seizures as well.   PT Comments   Met with patient to assess ambulation s/p seizure meds being administered.  Patient demonstrates significant deficits in function deficits related to ambulation including poor stability, inability to sequence through ambulation with assistive device and requiring significant assist.  Spoke with patient and sister who state patient had been ambulating frequently prior to transfer to [facility].  At this time feel patient would benefit significantly from CIR to address deficits, maximize functional return. Will continue to see as indicated and progress as tolerated.  Follow Up Recommendations  CIR           Equipment Recommendations  Other (comment) (TBD)    Recommendations for Other Services Speech consult  Frequency Min 4X/week      Plan Discharge plan needs to be updated    Precautions / Restrictions Precautions Precautions: Fall;Other (comment) (Seizures) Precaution Comments: Seizure   Pertinent Vitals/Pain No pain reported    Mobility  Bed Mobility Bed Mobility: Supine to Sit;Sitting - Scoot to [place] of Bed;Sit to Supine Supine to Sit: 4: Min assist Sitting - Scoot to [place] of Bed: 4: Min assist Sit to Supine: 4: Min assist Details for Bed Mobility Assistance: VCs for  sequencing and body positioning Transfers Transfers: Sit to Stand;Stand to Sit Sit to Stand: 4: Min assist;With upper extremity assist;From bed;With armrests Stand to Sit: 4: Min assist;With upper extremity assist;With armrests;To chair/3-in-1 Details for Transfer Assistance: Assist for stability, VCs for hand placement Ambulation/Gait Ambulation/Gait Assistance: 2: Max assist Ambulation Distance (Feet): 14 Feet Assistive device: Rolling walker Ambulation/Gait Assistance Details: patient significantly unsteady, unable to sequence ambulation  Gait Pattern: Step-to pattern;Shuffle;Trunk flexed;Narrow base of support Stairs: No     PT Goals (current goals can now be found in the care plan section) Acute Rehab PT Goals Patient Stated Goal: Go home, get well PT Goal Formulation: With patient Time For Goal Achievement: 11/10/13 Potential to Achieve Goals: Good  Visit Information  Last PT Received On: 10/27/13 Assistance Needed: +1 History of Present Illness: Pt admitted w/ dx: Diverticulitis of colon w/ perforation, abdominal pain. Pt w/ h/o Glioblastoma left parietal brain, status post biopsy/debulking in April 2014 followed by radiation and adjuvant chemotherapy consisting of Temodar, currently being managed with Avastin every 2 weeks. He subsequently was started on steroids secondary to progression of disease with right-sided weakness in September 2014. As of 10/24/13, RN staff, pt/family are reporting seizures as well.    Subjective Data  Patient Stated Goal: Go home, get well   Cognition  Cognition Arousal/Alertness: Awake/alert Behavior During Therapy: WFL for tasks assessed/performed;Flat affect Overall Cognitive Status: Impaired/Different from baseline Area of Impairment: Following commands;Safety/judgement;Awareness;Problem solving Following Commands: Follows one step commands inconsistently;Follows multi-step commands inconsistently Safety/Judgement: Decreased awareness of  safety;Decreased awareness of deficits Awareness: Anticipatory Problem Solving: Slow processing;Decreased initiation;Difficulty sequencing;Requires verbal cues;Requires tactile cues    Balance  Balance Balance Assessed: Yes Static  Sitting Balance Static Sitting - Balance Support: Feet supported;Right upper extremity supported  End of Session PT - End of Session Equipment Utilized During Treatment: Gait belt Activity Tolerance: Patient tolerated treatment well Patient left: in chair;with call bell/phone within reach;with family/visitor present Nurse Communication: Mobility status   GP     Fabio Asa 10/27/2013, 4:37 PM Charlotte Crumb, PT DPT  [phone]

## 2013-10-27 NOTE — Progress Notes (Signed)
[facility]    Total days of antibiotics 16 Day 3 ceftriaxone  Day 3 metronidazole   ( 12 days of ertapenem and 2 days piptazo)    Subjective: No new complaints   Antibiotics:  Anti-infectives   Start     Dose/Rate Route Frequency Ordered Stop   10/26/13 1400  metroNIDAZOLE (FLAGYL) tablet 500 mg     500 mg Oral 3 times per day 10/26/13 0945     10/25/13 1800  metroNIDAZOLE (FLAGYL) IVPB 500 mg  Status:  Discontinued     500 mg 100 mL/hr over 60 Minutes Intravenous Every 8 hours 10/25/13 1743 10/26/13 0945   10/25/13 1800  cefTRIAXone (ROCEPHIN) 2 g in dextrose 5 % 50 mL IVPB     2 g 100 mL/hr over 30 Minutes Intravenous Every 24 hours 10/25/13 1743     10/24/13 1200  piperacillin-tazobactam (ZOSYN) IVPB 3.375 g  Status:  Discontinued     3.375 g 12.5 mL/hr over 240 Minutes Intravenous Every 8 hours 10/24/13 1114 10/25/13 1742   10/13/13 1800  fluconazole (DIFLUCAN) IVPB 200 mg     200 mg 100 mL/hr over 60 Minutes Intravenous Every 24 hours 10/13/13 1707 10/22/13 1906   10/13/13 1715  ertapenem (INVANZ) 1 g in sodium chloride 0.9 % 50 mL IVPB  Status:  Discontinued     1 g 100 mL/hr over 30 Minutes Intravenous Every 24 hours 10/13/13 1707 10/13/13 1750   10/12/13 1600  fluconazole (DIFLUCAN) tablet 100 mg  Status:  Discontinued     100 mg Oral Daily 10/12/13 1428 10/13/13 1707   10/12/13 1600  sulfamethoxazole-trimethoprim (BACTRIM DS) 800-160 MG per tablet 1 tablet  Status:  Discontinued     1 tablet Oral Daily 10/12/13 1428 10/13/13 1707   10/12/13 1600  ertapenem (INVANZ) 1 g in sodium chloride 0.9 % 50 mL IVPB  Status:  Discontinued     1 g 100 mL/hr over 30 Minutes Intravenous Every 24 hours 10/12/13 1507 10/24/13 1107      Medications: Scheduled Meds: . antiseptic oral rinse  15 mL Mouth Rinse q12n4p  . calcium carbonate  1 tablet Oral Daily  . cefTRIAXone (ROCEPHIN)  IV  2 g Intravenous Q24H  . chlorhexidine  15 mL Mouth Rinse  BID  . cloNIDine  0.1 mg Transdermal Weekly  . dexamethasone  4 mg Oral Q8H  . enoxaparin (LOVENOX) injection  40 mg Subcutaneous Q24H  . heparin flush  250 Units Intracatheter Once  . insulin aspart  0-9 Units Subcutaneous TID WC  . lacosamide (VIMPAT) IV  200 mg Intravenous Q12H  . levETIRAcetam  1,500 mg Intravenous Q12H  . lip balm   Topical BID  . metoprolol  10 mg Intravenous Q6H  . metroNIDAZOLE  500 mg Oral Q8H  . multivitamin with minerals  1 tablet Oral Daily  . pantoprazole  40 mg Oral BID  . polyethylene glycol  17 g Oral Daily  . saccharomyces boulardii  250 mg Oral BID  . topiramate  50 mg Oral BID   Continuous Infusions: . 0.9 % NaCl with KCl 20 mEq / L 45 mL/hr at 10/26/13 0500   PRN Meds:.acetaminophen, acetaminophen, alum & mag hydroxide-simeth, diphenhydrAMINE, fentaNYL, hydrALAZINE, LORazepam, magic mouthwash, ondansetron (ZOFRAN) IV, ondansetron, oxyCODONE   Objective: Weight change:   Intake/Output Summary (Last 24 hours) at 10/27/13 1806 Last data filed at 10/27/13 1702  Gross per 24 hour  Intake    737 ml  Output   1532 ml  Net   -795 ml   Blood pressure 132/85, pulse 126, temperature 98 F (36.7 C), temperature source Oral, resp. rate 20, height 6' (1.829 m), weight 195 lb 12.3 oz (88.8 kg), SpO2 96.00%. Temp:  [97.3 F (36.3 C)-98.3 F (36.8 C)] 98 F (36.7 C) (11/07 1624) Pulse Rate:  [94-129] 126 (11/07 1624) Resp:  [16-20] 20 (11/07 1624) BP: (129-150)/(76-93) 132/85 mmHg (11/07 1624) SpO2:  [94 %-97 %] 96 % (11/07 1624) Weight:  [195 lb 12.3 oz (88.8 kg)] 195 lb 12.3 oz (88.8 kg) (11/06 2100)  Physical Exam: General: Alert and awake, oriented eating dinner HEENT: anicteric sclera,  EOMI CVS regular rate, Chest: , no wheezing,  Abdomen:  nondistended,  Extremities: no  clubbing or edema noted bilaterally Skin: no rashes Neuro: nonfocal  Lab Results:  Recent Labs  10/25/13 0510 10/26/13 0530  WBC 14.5* 11.9*  HGB 11.6* 10.5*    HCT 34.8* 31.8*  PLT 266 237    BMET  Recent Labs  10/25/13 0510 10/26/13 0530  NA 133* 136  K 4.4 4.0  CL 96 98  CO2 25 27  GLUCOSE 174* 147*  BUN 19 19  CREATININE 0.65 0.52  CALCIUM 9.1 8.6    Micro Results: Recent Results (from the past 240 hour(s))  URINE CULTURE     Status: None   Collection Time    10/20/13  6:30 AM      Result Value Range Status   Specimen Description URINE, CLEAN CATCH   Final   Special Requests NONE   Final   Culture  Setup Time     Final   Value: 10/20/2013 09:23     Performed at [facility] Count     Final   Value: NO GROWTH     Performed at [facility]   Culture     Final   Value: NO GROWTH     Performed at [facility]   Report Status 10/21/2013 FINAL   Final    Studies/Results: No results found.    Assessment/Plan: Martin Martin is a 57 y.o. male with with recent dx of glioblastoma presents with diverticulitis/ileus c/b intra-abdominal abscesses, cx sensitive + ecoli isolated. Hospitalization now complicated by poorly controlled seizures.    - continue with ceftriaxone 2gm IV daily plus metronidazole 500mg  IV TID. Can switch to oral flagyl if he is taking orals after ileus resolved.  - will need 2-4 wk of therapy or possibly longer pending repeat imaging of abdominal fluid collection    Dr. Orvan Falconer available on the weekend for questions.    LOS: 15 days   Acey Lav 10/27/2013, 6:06 PM

## 2013-10-27 NOTE — Progress Notes (Signed)
Martin Martin is now on 26 N. He does seem to be fairly lucent and to carry. He knew where he was.  He still having seizure activity. When I was with him, there were no seizures.  He is on 2 anti-seizure medications. He may need a third.  He was very clear and very coherent and stating that he did not want anymore surgery nor any more treatment for his recurrent brain cancer.  I spoke with one of the neurosurgeons yesterday. He look at the MRI and said that the tumor could be resected but it was no guarantee that the seizures would resolve.  In speaking with Martin Martin, he made it  very clear that he did not want to be alive on machines. He did not want any extraordinary or heroic measures to be kept alive. He has a very strong faith and does that he will "win." As such, he is now a DO NOT RESUSCITATE. I certainly can understand this.  He does not want hospice yet. I explained to him the hospital to help he and his family out when he goes home. He understands this but does not want him yet. As such, palliative care medicine I think will be very helpful. I put the consult order in.  He is able to eat. This makes him happy. He's not having any nausea or vomiting. I would try to convert his medicines to oral if possible.  On his physical exam, temperature 97.3. Pulse 94. Blood pressure 144/82. There is no thrush. He has no adenopathy in the neck. Lungs are relatively clear. Cardiac exam regular rate and rhythm. Abdomen still slightly distended. He has slightly decreased bowel sounds. There is no guarding or rebound tenderness. Extremities shows some weakness over on the right side.  This is a very difficult time for Martin Martin. He is doing his best. He is trying as much as you can.  I spoke to his wife and sister by phone yesterday the day before. I believe that they understand the situation. I will talk to them again today to let them know what Martin Martin and I talked about and what Mr.  Martin Martin wishes.  As always, we had a great prayer session. Martin Martin has no fears of dying. He knows where his "home" is and who is waiting for him. He just wants to have comfort and quality of life if possible. Hopefully the seizures can be controlled.  Martin E.  Psalm 91:14-16

## 2013-10-27 NOTE — Evaluation (Addendum)
Physical Therapy Evaluation Patient Details Name: Martin Martin MRN: [id number] DOB: 1955/12/31 Today's Date: 10/27/2013 Time: 7829-5621 PT Time Calculation (min): 26 min  PT Assessment / Plan / Recommendation History of Present Illness  Pt admitted w/ dx: Diverticulitis of colon w/ perforation, abdominal pain. Pt w/ h/o Glioblastoma left parietal brain, status post biopsy/debulking in April 2014 followed by radiation and adjuvant chemotherapy consisting of Temodar, currently being managed with Avastin every 2 weeks. He subsequently was started on steroids secondary to progression of disease with right-sided weakness in September 2014. As of 10/24/13, RN staff, pt/family are reporting seizures as well.  Clinical Impression  Patient demonstrates deficits in functional mobility at this time. Limited assessment while awaiting seizure meds to be on board for safety.  Assisted patient with positioning for eating breakfast. Spoke at length with patient and sister regarding mobility. Will follow up with further assessment of ambulation and DME needs this afternoon.    PT Assessment  Patient needs continued PT services    Follow Up Recommendations   CIR       Barriers to Discharge Decreased caregiver support      Equipment Recommendations  Other (comment) (TBD)    Recommendations for Other Services Speech consult   Frequency Min 4X/week    Precautions / Restrictions Precautions Precautions: Fall;Other (comment) (Seizures) Precaution Comments: Seizure Restrictions Weight Bearing Restrictions: No   Pertinent Vitals/Pain Reports no pain at this time      Mobility  Bed Mobility Bed Mobility: Supine to Sit;Sitting - Scoot to [place] of Bed;Sit to Supine Supine to Sit: 4: Min assist Sitting - Scoot to [place] of Bed: 4: Min assist Sit to Supine: 4: Min assist Details for Bed Mobility Assistance: VCs for sequencing and body positioning Transfers Transfers: Sit to Stand;Stand to Sit Sit to  Stand: 4: Min assist;With upper extremity assist;From bed;With armrests Stand to Sit: 4: Min assist;With upper extremity assist;With armrests;To chair/3-in-1 Details for Transfer Assistance: Assist for stability to stand to use urinal.  Ambulation/Gait Ambulation/Gait Assistance: Not tested (comment)        PT Diagnosis: Difficulty walking;Generalized weakness;Acute pain;Altered mental status  PT Problem List: Decreased strength;Decreased activity tolerance;Decreased balance;Decreased mobility;Decreased cognition PT Treatment Interventions: DME instruction;Gait training;Stair training;Functional mobility training;Therapeutic activities;Therapeutic exercise;Balance training;Patient/family education     PT Goals(Current goals can be found in the care plan section) Acute Rehab PT Goals Patient Stated Goal: Go home, get well PT Goal Formulation: With patient Time For Goal Achievement: 11/10/13 Potential to Achieve Goals: Good  Visit Information  Last PT Received On: 10/27/13 Assistance Needed: +1 History of Present Illness: Pt admitted w/ dx: Diverticulitis of colon w/ perforation, abdominal pain. Pt w/ h/o Glioblastoma left parietal brain, status post biopsy/debulking in April 2014 followed by radiation and adjuvant chemotherapy consisting of Temodar, currently being managed with Avastin every 2 weeks. He subsequently was started on steroids secondary to progression of disease with right-sided weakness in September 2014. As of 10/24/13, RN staff, pt/family are reporting seizures as well.       Prior Functioning  Home Living Family/patient expects to be discharged to:: Private residence Living Arrangements: Spouse/significant other Available Help at Discharge: Friend(s) Type of Home: House Home Access: Stairs to enter [employer] of Steps: 2 STE Entrance Stairs-Rails: None Home Layout: One level;Other (Comment) (Basement w/ laundry) Home Equipment: Grab bars -  tub/shower Prior Function Level of Independence: Independent with assistive device(s) Communication Communication: Expressive difficulties Dominant Hand: Right    Cognition  Cognition Arousal/Alertness: Awake/alert  Behavior During Therapy: WFL for tasks assessed/performed;Flat affect Overall Cognitive Status: Within Functional Limits for tasks assessed    Extremity/Trunk Assessment Lower Extremity Assessment Lower Extremity Assessment: Generalized weakness   Balance Balance Balance Assessed: Yes Static Sitting Balance Static Sitting - Balance Support: Feet supported;Right upper extremity supported  End of Session PT - End of Session Activity Tolerance: Patient tolerated treatment well Patient left: in chair;with call bell/phone within reach;with family/visitor present Nurse Communication: Mobility status  GP     Fabio Asa 10/27/2013, 10:36 AM Charlotte Crumb, PT DPT  [phone]

## 2013-10-27 NOTE — Progress Notes (Addendum)
Palliative Medicine Team consult request received;spoke with patient and sister Martin Martin [phone] at bedside; [name] stated he did not want to set a time to speak with PMT until he spoke with his wife, Martin Martin; Clinical research associate offered to call his wife however Mr Corpus asked Clinical research associate not to and stated he would speak with her; spoke outside of room with sister Martin Martin who informed that her brother's wife has had heart problems- however the last month she has improved to the point where she believes his wife is at home and working; - Martin Martin shared her sister-in-law has not been in to visit and has been very difficult to get in touch with- Martin Martin shared she and her sister Martin Martin are here to support their brother but  He and his wife make decisions - Martin Martin shared she has a different cell number for patient's wife [phone]);  -As previously noted when speaking with patient,this writer offered to contact wife- however patient said "no, I will talk with her" PMT contact information given to patient and sister Martin Martin and requested call when they are ready to set meeting time- PMT will continue to follow and be open to meeting when patient Martin Martin are ready;  spoke with [name], staff RN who is aware of above and will encourage wife to contact PMT should she be in to visit    Valente David, RN 10/27/2013, 10:47 AM Palliative Medicine Team RN Liaison [phone]

## 2013-10-28 ENCOUNTER — Inpatient Hospital Stay (HOSPITAL_COMMUNITY): Payer: BC Managed Care – PPO

## 2013-10-28 LAB — GLUCOSE, CAPILLARY
Glucose-Capillary: 190 mg/dL — ABNORMAL HIGH (ref 70–99)
Glucose-Capillary: 150 mg/dL — ABNORMAL HIGH (ref 70–99)

## 2013-10-28 MED ORDER — OXYCODONE HCL 5 MG/5ML PO SOLN: 10.0000 mg | ORAL | Status: DC | PRN

## 2013-10-28 NOTE — Progress Notes (Signed)
Patient woke up from nap very confused, disoriented and delayed and garbled speech. MD notified and Ct of the head ordered. Vitals stable. Will continue to monitor.

## 2013-10-28 NOTE — Progress Notes (Signed)
Occupational Therapy Treatment Patient Details Name: Martin Martin MRN: [id number] DOB: Mar 25, 1956 Today's Date: 10/28/2013 Time: 7829-5621 OT Time Calculation (min): 25 min  OT Assessment / Plan / Recommendation  History of present illness Pt admitted w/ dx: Diverticulitis of colon w/ perforation, abdominal pain. Pt w/ h/o Glioblastoma left parietal brain, status post biopsy/debulking in April 2014 followed by radiation and adjuvant chemotherapy consisting of Temodar, currently being managed with Avastin every 2 weeks. He subsequently was started on steroids secondary to progression of disease with right-sided weakness in September 2014. As of 10/24/13, RN staff, pt/family are reporting seizures as well.   OT comments  Pt limited in session due to cognitive status. Pt repeatedly stated in bathroom that he could not see in front of him, but was looking and picking up IV cord. Nurse notified of pt stating this. Cues and assistance to wash face while in bathroom and set up toothbrush. Pt having difficulty following commands and demonstrates decreased safety awareness. Pt became tearful during session.   Follow Up Recommendations  Supervision/Assistance - 24 hour;CIR    Barriers to Discharge       Equipment Recommendations  None recommended by OT    Recommendations for Other Services    Frequency Min 2X/week   Progress towards OT Goals Progress towards OT goals: Progressing toward goals  Plan Discharge plan needs to be updated    Precautions / Restrictions Precautions Precautions: Fall;Other (comment) (seizures) Precaution Comments: Seizure Restrictions Weight Bearing Restrictions: No   Pertinent Vitals/Pain No apparent pain at end of session    ADL  Eating/Feeding: Minimal assistance Where Assessed - Eating/Feeding: Bed level Grooming: Wash/dry face;Minimal assistance Where Assessed - Grooming: Supported sitting;Supine, head of bed up Toilet Transfer: Min Technical brewer Method: Sit to Barista: Bedside commode Equipment Used: Gait belt;Rolling walker Transfers/Ambulation Related to ADLs: Mod/Max A for ambulation. Min A/Min guard for transfers. ADL Comments: Pt ambulated to sink to perform grooming tasks, but requiring a lot of assistance for ambulation with walker. Pt stood at sink and said repeatedly that he could not see anything in front of him. Pt requiring hand over hand assitance to initiate washing face. OT assisted in helping pt put toothpaste on toothbrush to prepare to brush teeth, but pt never brushed teeth and became tearful during session. Ambulated back to bed. Pt crying in bed. OT assisted in feeding-assisted in initiation of bringing fork to mouth.     OT Diagnosis:    OT Problem List:   OT Treatment Interventions:     OT Goals(current goals can now be found in the care plan section) Acute Rehab OT Goals Patient Stated Goal: not stated OT Goal Formulation: With patient Time For Goal Achievement: 11/08/13 Potential to Achieve Goals: Fair ADL Goals Pt Will Perform Eating: with set-up;sitting;with adaptive utensils;with caregiver independent in assisting Pt Will Perform Grooming: with set-up;with caregiver independent in assisting;sitting Pt Will Perform Lower Body Dressing: with min assist;sit to/from stand;with caregiver independent in assisting Pt Will Transfer to Toilet: with min assist;bedside commode;ambulating;stand pivot transfer Pt Will Perform Toileting - Clothing Manipulation and hygiene: with min guard assist;sit to/from stand;with caregiver independent in assisting Pt Will Perform Tub/Shower Transfer: with min assist;tub bench;shower seat;with caregiver independent in assisting;Stand pivot transfer  Visit Information  Last OT Received On: 10/28/13 Assistance Needed: +1 History of Present Illness: Pt admitted w/ dx: Diverticulitis of colon w/ perforation, abdominal pain. Pt w/ h/o  Glioblastoma left parietal brain, status post biopsy/debulking  in April 2014 followed by radiation and adjuvant chemotherapy consisting of Temodar, currently being managed with Avastin every 2 weeks. He subsequently was started on steroids secondary to progression of disease with right-sided weakness in September 2014. As of 10/24/13, RN staff, pt/family are reporting seizures as well.    Subjective Data      Prior Functioning       Cognition  Cognition Arousal/Alertness: Awake/alert Behavior During Therapy: WFL for tasks assessed/performed;Flat affect Overall Cognitive Status: Impaired/Different from baseline Area of Impairment: Orientation;Following commands;Safety/judgement;Problem solving Orientation Level: Disoriented to;Time Following Commands: Follows one step commands inconsistently Safety/Judgement: Decreased awareness of safety;Decreased awareness of deficits Problem Solving: Slow processing;Decreased initiation;Difficulty sequencing;Requires verbal cues;Requires tactile cues General Comments: Multiple cues and assistance required for ambulation with walker and to step closer to sink. Pt required cues and assistance for grooming tasks.    Mobility  Bed Mobility Bed Mobility: Sit to Supine;Supine to Sit Supine to Sit: 4: Min assist Sit to Supine: 4: Min guard Details for Bed Mobility Assistance: Assisted with LE's when getting back in bed.  Transfers Transfers: Sit to Stand;Stand to Sit Sit to Stand: 4: Min assist;4: Min guard;With upper extremity assist;From bed;From chair/3-in-1 Stand to Sit: 4: Min assist;To bed;To chair/3-in-1 Details for Transfer Assistance: Min guard for sit <> stand from [facility], otherwise Min A level. Cues and assistance for hand placement.    Exercises      Balance     End of Session OT - End of Session Equipment Utilized During Treatment: Gait belt;Rolling walker Activity Tolerance: Other (comment) (limited due to cognitive status) Patient left:  in bed;with call bell/phone within reach;with bed alarm set;with family/visitor present Nurse Communication: Other (comment) (behavior during session)  GO     Earlie Raveling OTR/L [phone] 10/28/2013, 6:02 PM

## 2013-10-28 NOTE — Progress Notes (Signed)
NEURO HOSPITALIST PROGRESS NOTE   SUBJECTIVE:                                                                                                                        Reports no seizures at least for the last 48 hours. On keppra 1500 mg BID, vimpat 200 mg BID, and topamax 50 mg BID. Denies side effects to the above regimen. No HA, nausea, vomiting, confusion.   OBJECTIVE:                                                                                                                           Vital signs in last 24 hours: Temp:  [97.6 F (36.4 C)-98.3 F (36.8 C)] 97.7 F (36.5 C) (11/08 0951) Pulse Rate:  [100-127] 114 (11/08 0951) Resp:  [18-22] 22 (11/08 0951) BP: (131-152)/(85-93) 135/91 mmHg (11/08 0951) SpO2:  [96 %-99 %] 99 % (11/08 0951)  Intake/Output from previous day: 11/07 0701 - 11/08 0700 In: 360 [P.O.:360] Out: 1382 [Urine:1375; Drains:7] Intake/Output this shift: Total I/O In: -  Out: 420 [Urine:420] Nutritional status: Criss Rosales  Past Medical History  Diagnosis Date  . Hyperlipidemia   . Right bundle branch block     Normal LV function normal aortic and mitral valve bedside echocardiogram 4 2012  . Tachycardia     Resolved  . Dyslipidemia   . Coronary artery disease (CAD) excluded     Cardiolite study 2006 within normal limits  . Hypertension   . Allergy   . Cancer     Neurologic Exam:  Mental Status:  Alert, oriented to hospital. Speech fluent with some word substitution. Able to follow simple step commands but does have difficulty with complex commands.  Cranial Nerves:  II: Visual fields grossly normal, pupils equal, round, reactive to light and accommodation  III,IV, VI: ptosis not present, extra-ocular motions intact bilaterally  V,VII: smile symmetric, facial light touch sensation normal bilaterally  VIII: hearing normal bilaterally  IX,X: gag reflex present  XI: bilateral shoulder shrug  XII: midline  tongue extension without atrophy or fasciculations  Motor:  Right : Upper extremity 5/5 Left: Upper extremity 5/5  Lower extremity 5/5 Lower extremity 5/5  Tone and bulk:normal tone throughout; no atrophy noted  Sensory: Pinprick and light touch intact throughout, bilaterally  Deep Tendon Reflexes:  Right: Upper Extremity Left: Upper extremity  biceps (C-5 to C-6) 2/4 biceps (C-5 to C-6) 2/4  tricep (C7) 2/4 triceps (C7) 2/4  Brachioradialis (C6) 2/4 Brachioradialis (C6) 2/4  Lower Extremity Lower Extremity  quadriceps (L-2 to L-4)3/4 quadriceps (L-2 to L-4) 3/4  Achilles (S1) 2/4 Achilles (S1) 2/4  Plantars:  Right: downgoing Left: downgoing   Lab Results: No results found for this basename: cbc, bmp, coags, chol, tri, ldl, hga1c   Lipid Panel No results found for this basename: CHOL, TRIG, HDL, CHOLHDL, VLDL, LDLCALC,  in the last 72 hours  Studies/Results: No results found.  MEDICATIONS                                                                                                                       I have reviewed the patient's current medications.  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 Very good seizure control at this moment. Continue current AEDs. Will follow up.  Dorian Pod, MD [facility] [phone]  10/28/2013, 11:20 AM

## 2013-10-28 NOTE — Progress Notes (Signed)
Drain, # 3 removed per order and covered with  A gauze. Open wound at the top of the umbilicus is also dressed with wet to dry gauze.

## 2013-10-28 NOTE — Progress Notes (Signed)
Patient abdominal incision above the umbilicus dehisced.  No drainage or oozing.  Sterile gauze applied and Surgeon on call, Dr Derrell Lolling notified.  Verbal order to apply sterile gauze on the wound and he will see the patient.  Martin Martin

## 2013-10-28 NOTE — Progress Notes (Signed)
15 Days Post-Op  Subjective: Feels well. No seizures.  Denies abdominal pain.  Tolerating diet Objective: Vital signs in last 24 hours: Temp:  [97.6 F (36.4 C)-98.3 F (36.8 C)] 97.7 F (36.5 C) (11/08 0951) Pulse Rate:  [100-127] 114 (11/08 0951) Resp:  [18-22] 22 (11/08 0951) BP: (131-152)/(85-93) 135/91 mmHg (11/08 0951) SpO2:  [96 %-99 %] 99 % (11/08 0951) Last BM Date: 10/26/13  Intake/Output from previous day: 11/07 0701 - 11/08 0700 In: 360 [P.O.:360] Out: 1382 [Urine:1375; Drains:7] Intake/Output this shift: Total I/O In: -  Out: 420 [Urine:420]  General appearance: alert, cooperative and no distress GI: soft, NT, round abdomen, upper midline trocar site opened up, but no sign of infection.  Left lateral drains with some milky output but the suprapubic drain is clear  Lab Results:   Recent Labs  10/26/13 0530  WBC 11.9*  HGB 10.5*  HCT 31.8*  PLT 237   BMET  Recent Labs  10/26/13 0530  NA 136  K 4.0  CL 98  CO2 27  GLUCOSE 147*  BUN 19  CREATININE 0.52  CALCIUM 8.6   PT/INR No results found for this basename: LABPROT, INR,  in the last 72 hours ABG No results found for this basename: PHART, PCO2, PO2, HCO3,  in the last 72 hours  Studies/Results: No results found.  Anti-infectives: Anti-infectives   Start     Dose/Rate Route Frequency Ordered Stop   10/26/13 1400  metroNIDAZOLE (FLAGYL) tablet 500 mg     500 mg Oral 3 times per day 10/26/13 0945     10/25/13 1800  metroNIDAZOLE (FLAGYL) IVPB 500 mg  Status:  Discontinued     500 mg 100 mL/hr over 60 Minutes Intravenous Every 8 hours 10/25/13 1743 10/26/13 0945   10/25/13 1800  cefTRIAXone (ROCEPHIN) 2 g in dextrose 5 % 50 mL IVPB     2 g 100 mL/hr over 30 Minutes Intravenous Every 24 hours 10/25/13 1743     10/24/13 1200  piperacillin-tazobactam (ZOSYN) IVPB 3.375 g  Status:  Discontinued     3.375 g 12.5 mL/hr over 240 Minutes Intravenous Every 8 hours 10/24/13 1114 10/25/13 1742   10/13/13 1800  fluconazole (DIFLUCAN) IVPB 200 mg     200 mg 100 mL/hr over 60 Minutes Intravenous Every 24 hours 10/13/13 1707 10/22/13 1906   10/13/13 1715  ertapenem (INVANZ) 1 g in sodium chloride 0.9 % 50 mL IVPB  Status:  Discontinued     1 g 100 mL/hr over 30 Minutes Intravenous Every 24 hours 10/13/13 1707 10/13/13 1750   10/12/13 1600  fluconazole (DIFLUCAN) tablet 100 mg  Status:  Discontinued     100 mg Oral Daily 10/12/13 1428 10/13/13 1707   10/12/13 1600  sulfamethoxazole-trimethoprim (BACTRIM DS) 800-160 MG per tablet 1 tablet  Status:  Discontinued     1 tablet Oral Daily 10/12/13 1428 10/13/13 1707   10/12/13 1600  ertapenem (INVANZ) 1 g in sodium chloride 0.9 % 50 mL IVPB  Status:  Discontinued     1 g 100 mL/hr over 30 Minutes Intravenous Every 24 hours 10/12/13 1507 10/24/13 1107      Assessment/Plan: s/p Procedure(s): DIAGNOSTIC LAPAROSCOPY and Placement of Drains (N/A) looks and feels fine.  Remains AF and tolerating diet.  remove pubic drain.  wet to dry dressings for open wound  LOS: 16 days    Martin Martin DAVID 10/28/2013

## 2013-10-28 NOTE — Progress Notes (Signed)
[facility]                                                                                Patient Demographics  Martin Martin, is a 57 y.o. male, DOB - 08-15-1956, EAV:[id number]  Admit date - 10/12/2013   Admitting Physician Ernestene Mention, MD  Outpatient Primary MD for the patient is [name], MD  LOS - 16   Chief Complaint  Patient presents with  . 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 Myna Hidalgo (Oncology) -Continue Keppra 1500 mg twice a day as well as the impact 200 mg twice a day, Topiramate 50mg  BID, and Ativan PRN. -Currently on Decadron 4 mg IV every 6 hours for edema in the brain.  MRI 11/4 confirms Interval decrease in size of left parietal mass with improved vasogenic edema and resolved midline shift as compared to prior MRI.  Diverticulitis of colon with perforation -POD 15 s/p diagnostic laparoscopy and placement of drains -Patient was noted to have wound dehiscence yesterday evening. Surgery has been made aware of this and will see the patient today. Wound does look clean, no purulent discharge. -body fluid cultures: +Ecoli -patient was started on ertapenem and swiched to Zosyn.   -Infectious disease is also following.  -Patient currently on Ceftrixone 2mg  IV daily and flagyl 500mg  TID.   Patient will need 2-4 weeks of antibiotics.  Can be switched to oral flagyl once ileus has resolved. -Patient had NG tube which was taken out on 10/29. Patient was started on clear liquid diet. Surgery is following.   Hyperglycemia-  -Ssecondary to steroids, will continue on Sliding scale insulin.   Hypertension -continue metoprolol 10 mg IV every 6 hours and Catapres patch.  -Will switch to oral  medications once patient seizures are controlled.   Leukocytosis- -Likely secondary to steroids, patient remains afebrile  Oral thrush -Completed course of Diflucan  Code Status: DNR Family Communication: None at bedside.  Will try to contact spouse. Disposition Plan: Admitted.  CM and SW looking at [facility].  Procedures  Diagnostic laparoscopy  Consults   Surgery Neurology Oncology Palliative care Infectious disease  DVT Prophylaxis  Lovenox   Lab Results  Component Value Date   PLT 237 10/26/2013    Medications  Scheduled Meds: . antiseptic oral rinse  15 mL Mouth Rinse q12n4p  . calcium carbonate  1 tablet Oral Daily  . cefTRIAXone (ROCEPHIN)  IV  2 g Intravenous Q24H  . chlorhexidine  15 mL Mouth Rinse BID  . cloNIDine  0.1 mg Transdermal Weekly  . dexamethasone  4 mg Oral Q8H  . enoxaparin (LOVENOX) injection  40 mg Subcutaneous Q24H  . heparin flush  250 Units Intracatheter Once  . insulin aspart  0-9 Units Subcutaneous TID WC  . lacosamide (VIMPAT) IV  200 mg Intravenous Q12H  . levETIRAcetam  1,500 mg Intravenous Q12H  . lip balm   Topical  BID  . metoprolol  10 mg Intravenous Q6H  . metroNIDAZOLE  500 mg Oral Q8H  . multivitamin with minerals  1 tablet Oral Daily  . pantoprazole  40 mg Oral BID  . polyethylene glycol  17 g Oral Daily  . saccharomyces boulardii  250 mg Oral BID  . sodium chloride  10-40 mL Intracatheter Q12H  . topiramate  50 mg Oral BID   Continuous Infusions: . 0.9 % NaCl with KCl 20 mEq / L 45 mL/hr at 10/26/13 0500   PRN Meds:.acetaminophen, acetaminophen, alum & mag hydroxide-simeth, diphenhydrAMINE, fentaNYL, hydrALAZINE, LORazepam, magic mouthwash, ondansetron (ZOFRAN) IV, ondansetron, oxyCODONE, sodium chloride, zolpidem  Antibiotics    Anti-infectives   Start     Dose/Rate Route Frequency Ordered Stop   10/26/13 1400  metroNIDAZOLE (FLAGYL) tablet 500 mg     500 mg Oral 3 times per day 10/26/13 0945     10/25/13 1800   metroNIDAZOLE (FLAGYL) IVPB 500 mg  Status:  Discontinued     500 mg 100 mL/hr over 60 Minutes Intravenous Every 8 hours 10/25/13 1743 10/26/13 0945   10/25/13 1800  cefTRIAXone (ROCEPHIN) 2 g in dextrose 5 % 50 mL IVPB     2 g 100 mL/hr over 30 Minutes Intravenous Every 24 hours 10/25/13 1743     10/24/13 1200  piperacillin-tazobactam (ZOSYN) IVPB 3.375 g  Status:  Discontinued     3.375 g 12.5 mL/hr over 240 Minutes Intravenous Every 8 hours 10/24/13 1114 10/25/13 1742   10/13/13 1800  fluconazole (DIFLUCAN) IVPB 200 mg     200 mg 100 mL/hr over 60 Minutes Intravenous Every 24 hours 10/13/13 1707 10/22/13 1906   10/13/13 1715  ertapenem (INVANZ) 1 g in sodium chloride 0.9 % 50 mL IVPB  Status:  Discontinued     1 g 100 mL/hr over 30 Minutes Intravenous Every 24 hours 10/13/13 1707 10/13/13 1750   10/12/13 1600  fluconazole (DIFLUCAN) tablet 100 mg  Status:  Discontinued     100 mg Oral Daily 10/12/13 1428 10/13/13 1707   10/12/13 1600  sulfamethoxazole-trimethoprim (BACTRIM DS) 800-160 MG per tablet 1 tablet  Status:  Discontinued     1 tablet Oral Daily 10/12/13 1428 10/13/13 1707   10/12/13 1600  ertapenem (INVANZ) 1 g in sodium chloride 0.9 % 50 mL IVPB  Status:  Discontinued     1 g 100 mL/hr over 30 Minutes Intravenous Every 24 hours 10/12/13 1507 10/24/13 1107       Time Spent in minutes   35 minutes   Latrelle Bazar D.O. on 10/28/2013 at 10:32 AM  Between 7am to 7pm - Pager - [phone]  After 7pm go to [email or web address] - password [email or web address]  And look for the night coverage person covering for me after hours  [facility] Office  [phone]    Subjective:   Martin Martin seen and examined today.  Patient has no complaints today. He understands how great his health is at this time. He does wish to be DO NOT RESUSCITATE. Patient does state he is hungry. Patient denies dizziness, chest pain, shortness of breath, abdominal pain, N/V/D/C, new weakness, numbess,  tingling.    Objective:   Filed Vitals:   10/27/13 1624 10/27/13 2117 10/28/13 0529 10/28/13 0951  BP: 132/85 152/87 131/89 135/91  Pulse: 126 127 100 114  Temp: 98 F (36.7 C) 97.8 F (36.6 C) 97.6 F (36.4 C) 97.7 F (36.5 C)  TempSrc: Oral Oral Oral Oral  Resp: 20 20  20 22  Height:      Weight:      SpO2: 96% 97% 98% 99%    Wt Readings from Last 3 Encounters:  10/26/13 88.8 kg (195 lb 12.3 oz)  10/26/13 88.8 kg (195 lb 12.3 oz)  10/26/13 88.8 kg (195 lb 12.3 oz)     Intake/Output Summary (Last 24 hours) at 10/28/13 1032 Last data filed at 10/28/13 0700  Gross per 24 hour  Intake    240 ml  Output   1382 ml  Net  -1142 ml    Exam  General: Well developed, well nourished, NAD, appears stated age  HEENT: NCAT, PERRLA, mucous membranes moist.   Neck: Supple, no JVD, no masses  Cardiovascular: S1 S2 auscultated, Regular rate and rhythm.  Respiratory: Clear to auscultation bilaterally with equal chest rise  Abdomen: Soft, nontender, nondistended, + bowel sounds, hypoactive.  Open wound noted on abdomen, currently clean, no drainage.  Extremities: warm dry without cyanosis clubbing or edema  Neuro: AAOx3, Focal deficits  Skin: Without rashes exudates or nodules  Psych: Normal affect and demeanor with intact judgement and insight  Data Review   Micro Results Recent Results (from the past 240 hour(s))  URINE CULTURE     Status: None   Collection Time    10/20/13  6:30 AM      Result Value Range Status   Specimen Description URINE, CLEAN CATCH   Final   Special Requests NONE   Final   Culture  Setup Time     Final   Value: 10/20/2013 09:23     Performed at [facility] Count     Final   Value: NO GROWTH     Performed at [facility]   Culture     Final   Value: NO GROWTH     Performed at [facility]   Report Status 10/21/2013 FINAL   Final    Radiology Reports Ct Head Wo Contrast  10/22/2013   CLINICAL DATA:   Evaluate for recent seizures. Glioblastoma status post surgery and radiotherapy.  EXAM: CT HEAD WITHOUT CONTRAST  TECHNIQUE: Contiguous axial images were obtained from the base of the skull through the vertex without contrast.  COMPARISON:  Metabolic brain scan 09/26/2013. MRI brain 09/08/13.  FINDINGS: Left parietal hypodensity at the surgical site with moderate surrounding vasogenic edema. No significant midline shift for impending herniation. As seen on image 23 cross-sectional measurements of 26 x 28 mm. There is slightly less mass effect when compared with prior MRI with regard to regional vasogenic edema. There is no superimposed hemorrhage, midline shift, or new intracranial abnormalities. Unremarkable appearing craniotomy defect.  IMPRESSION: Left parietal lesion appears stable to slightly improved with regard to mass effect and surrounding edema. No new abnormalities are detected on this noncontrast exam. 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   Electronically Signed   By: Davonna Belling M.D.   On: 10/22/2013 14:41   Mr Laqueta Jean AV Contrast  10/24/2013   CLINICAL DATA:  History of glioblastoma multiform and, new onset of recurrent seizures. Evaluate for tumor progression. History of tumor resection and radiotherapy.  EXAM: MRI HEAD WITHOUT AND WITH CONTRAST  TECHNIQUE: Multiplanar, multiecho pulse sequences of the brain and surrounding structures were obtained according to standard protocol without and with intravenous contrast  CONTRAST:  18mL MULTIHANCE GADOBENATE DIMEGLUMINE 529 MG/ML IV SOLN  COMPARISON:  Prior CT from 10/22/2013 and MRI from 09/08/2013.  FINDINGS: The post contrast sequences are somewhat degraded by motion artifact.  Previously identified left parietal mass is decreased in size now measuring approximately 4.9 x 2.9 x 3.4 cm (series 14, image 35). This lesion measured approximately 4.1 x 5.6 x 4.4 cm on prior study. Associated vasogenic  edema at these improved with decreased T2/FLAIR signal seen within the adjacent left parietal white matter. There is improved mass effect on the adjacent posterior horn of the left lateral ventricle. Restricted diffusion seen centrally with numbness mass is present, like related to necrosis and/ or postoperative changes. An irregular peripheral rim hemosiderin deposition and/ or blood products is present about the central cavity of this lesion. There is persistent irregular peripheral rim enhancement about the margins of the lesion, improved as compared to the prior exam.  Sequelae of left parietal craniotomy is seen overlying the mass. There is no extra-axial fluid collection.  No other abnormal foci of restricted diffusion are seen to suggest acute intracranial infarct. Normal flow voids are seen within the intracranial vasculature. No other mass lesion is identified. No mass effect is now seen within the brain. No other abnormal enhancement identified.  IMPRESSION: 1. Interval decrease in size of left parietal mass with improved vasogenic edema and resolved midline shift as compared to prior MRI from 09/08/2013. Persistent peripheral rim enhancement about this lesion may represent residual tumor versus radiation necrosis. 2. No other acute intracranial process identified within the brain.   Electronically Signed   By: Rise Mu M.D.   On: 10/24/2013 16:50   Ct Abdomen Pelvis W Contrast  10/23/2013   CLINICAL DATA:  Abdominal pain. Diverticulitis with colonic perforation.  EXAM: CT ABDOMEN AND PELVIS WITH CONTRAST  TECHNIQUE: Multidetector CT imaging of the abdomen and pelvis was performed using the standard protocol following bolus administration of intravenous contrast.  CONTRAST:  OMNIPAQUE IOHEXOL 300 MG/ML  SOLN  COMPARISON:  10/16/2013  FINDINGS: Atelectasis is identified within both lung bases. No focal liver abnormality identified. The gallbladder appears normal. No biliary dilatation.  Normal appearance of the pancreas. The spleen is unremarkable.  The adrenal glands are both normal. Normal appearance of the right kidney. Left renal cyst is again identified. Nonobstructing calculus within the inferior pole of the left kidney measures 5 mm, image 51/ series 2. The urinary bladder appears within normal limits. Prostate gland and seminal vesicles are negative.  Normal caliber of the abdominal aorta. No upper abdominal adenopathy noted. There is no pelvic or inguinal adenopathy.  The stomach appears normal. Small bowel loops appear mildly increased in caliber compatible with the clinical history of ileus. Again noted is extra luminal gas within the soft tissues of the left upper quadrant of the abdomen. Gas can be seen tracking around the stomach and into the posterior mediastinum. This appears similar to previous exam.  Multiple fluid collections are again noted within the upper abdomen. Index fluid collection ventral to the duodenum measures 4.1 cm, image 45/ series 2. Previously 3.7 cm. Index fluid collection posterior to the descending colon measures 4.8 cm, image 50/ series 2. This is compared with 5.6 cm previously. Fluid collection within the root of the mesenteric measures 4.1 x 1.9 cm, image 53/ series 2. Previously 5.2 x 2.8 cm. No new fluid collections identified. Similar appearance of percutaneous to drainage catheters within the central portion of the lower abdomen.  Review of the visualized osseous structures is significant for lumbar spondylosis. No aggressive lytic or sclerotic bone lesions.  IMPRESSION: 1. Multi  focal fluid collections are again noted within the abdomen. When compared with the previous exam these are slightly decreased in size in the interval. No new or enlarging fluid collections identified.  2. Mild increased caliber of the small bowel loops compatible with ileus. No specific features identified to suggest high-grade bowel obstruction.  3. Again noted is  extraluminal gas within the soft tissues of the left upper quadrant of the abdomen and extending into the posterior mediastinum. Findings a compatible with known perforated viscus.   Electronically Signed   By: Signa Kell M.D.   On: 10/23/2013 15:35   Ct Abdomen Pelvis W Contrast  10/16/2013   CLINICAL DATA:  Chronic perforation post surgery and drain placement, abdominal distention, question persistent extraluminal gas  EXAM: CT ABDOMEN AND PELVIS WITH CONTRAST  TECHNIQUE: Multidetector CT imaging of the abdomen and pelvis was performed using the standard protocol following bolus administration of intravenous contrast. Sagittal and coronal MPR images reconstructed from axial data set.  CONTRAST:  50mL OMNIPAQUE IOHEXOL 300 MG/ML SOLN, OMNIPAQUE IOHEXOL 300 MG/ML SOLN  COMPARISON:  10/12/2013  FINDINGS: Bibasilar atelectasis and minimal left pleural effusion.  Nasogastric tube extends into the 2nd portion of duodenum.  Multiple surgical drains present in abdomen, new.  Focus of air within urinary bladder likely reflects prior catheterization.  Mild fatty infiltration of liver.  4 mm nonobstructing calculus inferior pole left kidney image 45.  Liver, spleen, pancreas, and adrenal glands unremarkable.  Symmetric nephrograms with again identified 3.7 x 3.4 cm left renal cysts.  Extraluminal gas is identified under the left hemidiaphragm, within the mesenteric, an adjacent to the colon from the distal transverse the proximal descending segments consistent with prior colonic perforation.  Again identified thickening of a small bowel loop in the mid abdomen with a small adjacent fluid collection which measures 5.2 x 2.8 cm image 45, previously 5.1 x 3.2 cm.  Additional gas and fluid collection adjacent to the proximal descending colon, 5.6 x 3.8 cm image 43 previously 5.2 x 3.7 cm.  Additional fluid mesenteric fluid collections inferior to the pancreatic head appear stable.  Scattered diverticula of the  descending and proximal sigmoid colon.  No evidence of bowel obstruction, with GI contrast present into the ascending colon.  Unremarkable bladder and ureters.  Remaining both bowel loops normal appearance.  No acute osseous findings.  Surgical clips and infiltration at umbilicus likely reflect interval laparoscopy with note of a tiny umbilical hernia.  IMPRESSION: Again seen foci of extraluminal gas compatible with colonic perforation similar to previous exam.  Interval placement of surgical drains.  Persistent focal collections of fluid with minimal gas are seen adjacent to the ascending colon (minimally larger), inferior to pancreatic head (generally stable), and adjacent to a thickened small bowel loop in the mid abdomen (little changed).  Thickened small bowel loop could be due to ischemia, infection, or inflammatory bowel disease.  No definite new intra-abdominal or intrapelvic abnormalities identified.   Electronically Signed   By: Ulyses Southward M.D.   On: 10/16/2013 18:44   Ct Abdomen Pelvis W Contrast  10/12/2013   CLINICAL DATA:  Severe abdominal pain. History of CNS glioblastoma.  EXAM: CT ABDOMEN AND PELVIS WITH CONTRAST  TECHNIQUE: Multidetector CT imaging of the abdomen and pelvis was performed using the standard protocol following bolus administration of intravenous contrast.  CONTRAST:  OMNIPAQUE IOHEXOL 300 MG/ML  SOLN  COMPARISON:  10/11/2009  FINDINGS: Linear densities at the right lung base are suggestive for atelectasis.  There is free intraperitoneal air in the left upper quadrant with air around the distal esophagus. There appears to be some air within the retroperitoneal space. The largest focus of air is around the descending colon and there is fluid or inflammation in this area. The fluid collection around the descending colon roughly measures 5.2 x 3.7 cm and could represent a site for abscess formation. Findings raise concern for acute diverticulitis in this area. Patient has  extensive colonic diverticula. In addition, there is a loop of small bowel in the mid lower abdomen that demonstrates severe wall thickening measuring up to 1.1 cm. There is fluid and edema tracking from this abnormal loop of bowel into the mesentery above it. There is some fluid and small nodes throughout the central mesentery and adjacent to the duodenum.  No gross abnormality to the liver, gallbladder or portal venous system. No gross abnormality of the pancreas, spleen, adrenal glands. There is mild perinephric stranding or edema around the kidneys which appears chronic. Probable bilateral renal cysts. Calcification in the left kidney lower pole measures 4 mm and suggestive for nonobstructive stone.  No gross abnormality to the prostate, seminal vesicles or urinary bladder. No significant free fluid in the pelvis. No acute bone abnormality.  IMPRESSION: Study is positive for free air and suggestive for a bowel perforation. The largest focus of air is surrounding the descending colon. Patient has multiple colonic diverticula and findings could represent acute diverticulitis. There is fluid around the descending colon which may represent a potential site for abscess formation.  There is a severely thickened loop of small bowel in the mid abdomen. There is extensive edema and fluid tracking superior to this abnormal loop of bowel and towards the central mesentery. The patient is currently receiving chemotherapy and Avastin. Findings could represent a focal area of bowel ischemia from vasculitis. Findings may also associated with an infectious or inflammatory process.  Nonobstructive left kidney stone.  Renal cysts.  These results were called by telephone at the time of interpretation on 10/12/2013 at 12:27 PM to Dr. Rolland Porter , who verbally acknowledged these results.   Electronically Signed   By: Richarda Overlie M.D.   On: 10/12/2013 12:30   Dg Abd 2 Views  10/16/2013   CLINICAL DATA:  Distended abdomen. Status  post laparoscopy for possible perforation, no perforation identified at time of surgery.  EXAM: ABDOMEN - 2 VIEW  COMPARISON:  CT of the abdomen and pelvis 10/12/2013.  FINDINGS: Nasogastric tube extends into the proximal stomach, with side port just distal to the gastroesophageal junction. There is some oral contrast material in the ascending colon and cecum. Gas is noted throughout the colon and there is a small amount of gas in nondilated loops of small bowel. A surgical drain is noted in the pelvis, and skin staples are seen projecting over the left side of the abdomen. Notably, there are some irregular-shaped locular some gas projecting over the left upper quadrant of the abdomen, which are not clearly within either the colon or the stomach, suspicious for residual retroperitoneal gas as demonstrated on prior CT scan 10/12/2013.  IMPRESSION: 1. Probable persistent gas in the upper left retroperitoneum, suspicious for retroperitoneal perforation from the descending colon, as suggested on prior CT scan 10/12/2013. Given the recent negative laparoscopy, but persistently distended abdomen, further evaluation with repeat contrast-enhanced CT of the abdomen and pelvis may provide additional diagnostic information if clinically indicated. 2. Postoperative changes and support apparatus, as above. These results were called  by telephone at the time of interpretation on 10/16/2013 at 1:49 PM to Dr. Lodema Pilot , who verbally acknowledged these results.   Electronically Signed   By: Trudie Reed M.D.   On: 10/16/2013 13:54    CBC  Recent Labs Lab 10/22/13 0530 10/23/13 0525 10/25/13 0510 10/26/13 0530  WBC 14.7* 14.2* 14.5* 11.9*  HGB 11.4* 11.1* 11.6* 10.5*  HCT 34.2* 33.2* 34.8* 31.8*  PLT 261 257 266 237  MCV 97.7 97.9 98.6 98.8  MCH 32.6 32.7 32.9 32.6  MCHC 33.3 33.4 33.3 33.0  RDW 16.4* 16.7* 16.9* 16.6*  LYMPHSABS  --  0.7  --   --   MONOABS  --  1.0  --   --   EOSABS  --  0.0  --   --    BASOSABS  --  0.0  --   --     Chemistries   Recent Labs Lab 10/22/13 0530 10/23/13 0525 10/24/13 0605 10/25/13 0510 10/26/13 0530  NA 134* 136 136 133* 136  K 3.7 3.8 3.9 4.4 4.0  CL 98 99 99 96 98  CO2 26 26 27 25 27   GLUCOSE 164* 141* 141* 174* 147*  BUN 15 17 15 19 19   CREATININE 0.55 0.53 0.51 0.65 0.52  CALCIUM 9.0 8.7 9.0 9.1 8.6  MG  --  1.8  --   --  1.9  AST  --  30  --   --  47*  ALT  --  66*  --   --  104*  ALKPHOS  --  122*  --   --  108  BILITOT  --  0.2*  --   --  0.2*   ------------------------------------------------------------------------------------------------------------------ estimated creatinine clearance is 111.8 ml/min (by C-G formula based on Cr of 0.52). ------------------------------------------------------------------------------------------------------------------ No results found for this basename: HGBA1C,  in the last 72 hours ------------------------------------------------------------------------------------------------------------------ No results found for this basename: CHOL, HDL, LDLCALC, TRIG, CHOLHDL, LDLDIRECT,  in the last 72 hours ------------------------------------------------------------------------------------------------------------------ No results found for this basename: TSH, T4TOTAL, FREET3, T3FREE, THYROIDAB,  in the last 72 hours ------------------------------------------------------------------------------------------------------------------ No results found for this basename: VITAMINB12, FOLATE, FERRITIN, TIBC, IRON, RETICCTPCT,  in the last 72 hours  Coagulation profile No results found for this basename: INR, PROTIME,  in the last 168 hours  No results found for this basename: DDIMER,  in the last 72 hours  Cardiac Enzymes No results found for this basename: CK, CKMB, TROPONINI, MYOGLOBIN,  in the last 168  hours ------------------------------------------------------------------------------------------------------------------ No components found with this basename: POCBNP,

## 2013-10-28 NOTE — Progress Notes (Signed)
Rehab Admissions Coordinator Note:  Patient was screened by Clois Dupes for appropriateness for an Inpatient Acute Rehab Consult.  At this time, we are recommending need clarification of overall medical plan and prognosis to better determine most appropriate diso planning. Clois Dupes 10/28/2013, 9:11 AM  I can be reached at [phone].

## 2013-10-29 DIAGNOSIS — B370 Candidal stomatitis: Secondary | ICD-10-CM

## 2013-10-29 LAB — GLUCOSE, CAPILLARY
Glucose-Capillary: 142 mg/dL — ABNORMAL HIGH (ref 70–99)
Glucose-Capillary: 130 mg/dL — ABNORMAL HIGH (ref 70–99)
Glucose-Capillary: 126 mg/dL — ABNORMAL HIGH (ref 70–99)

## 2013-10-29 MED ORDER — METOPROLOL TARTRATE 25 MG PO TABS
25.0000 mg | ORAL_TABLET | Freq: Two times a day (BID) | ORAL | Status: DC
  Administered 2013-10-29 – 2013-11-01 (×8): 25 mg via ORAL
  Filled 2013-10-29 (×10): qty 1

## 2013-10-29 NOTE — Progress Notes (Signed)
[facility]                                                                                Patient Demographics  Martin Martin, is a 57 y.o. male, DOB - 12/27/1955, YNW:[id number]  Admit date - 10/12/2013   Admitting Physician Ernestene Mention, MD  Outpatient Primary MD for the patient is [name], MD  LOS - 17   Chief Complaint  Patient presents with  . 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 Myna Hidalgo (Oncology) -Continue Keppra 1500 mg twice a day as well as the impact 200 mg twice a day, Topiramate 50mg  BID, and Ativan PRN. -Currently on Decadron 4 mgPO every 8 hours for edema in the brain.  MRI 11/4 confirms Interval decrease in size of left parietal mass with improved vasogenic edema and resolved midline shift as compared to prior MRI.  Diverticulitis of colon with perforation -POD 16 s/p diagnostic laparoscopy and placement of drains -Patient was noted to have wound dehiscence yesterday evening. Surgery has been made aware of this and will see the patient today. Wound does look clean, no purulent discharge. -body fluid cultures: +Ecoli -patient was started on ertapenem and swiched to Zosyn.   -Infectious disease is also following.  -Patient currently on Ceftrixone 2mg  IV daily and flagyl 500mg  TID.   Patient will need 2-4 weeks of antibiotics.  Can be switched to oral flagyl once ileus has resolved. -Patient had NG tube which was taken out on 10/29. Patient was started on clear liquid diet. Surgery is following.   Hyperglycemia-  -Secondary to steroids, will continue on Sliding scale insulin.   Hypertension -continue Catapres patch. Will switch to PO metoprolol.  Leukocytosis -Likely  secondary to steroids, patient remains afebrile  Oral thrush -Completed course of Diflucan, continue magic mouth wash  Code Status: DNR Family Communication: None at bedside.   Disposition Plan: Admitted.  CM and SW for SNF placement  Procedures  Diagnostic laparoscopy  Consults   Surgery Neurology Oncology Palliative care Infectious disease  DVT Prophylaxis  Lovenox   Lab Results  Component Value Date   PLT 237 10/26/2013    Medications  Scheduled Meds: . antiseptic oral rinse  15 mL Mouth Rinse q12n4p  . calcium carbonate  1 tablet Oral Daily  . cefTRIAXone (ROCEPHIN)  IV  2 g Intravenous Q24H  . chlorhexidine  15 mL Mouth Rinse BID  . cloNIDine  0.1 mg Transdermal Weekly  . dexamethasone  4 mg Oral Q8H  . enoxaparin (LOVENOX) injection  40 mg Subcutaneous Q24H  . heparin flush  250 Units Intracatheter Once  . insulin aspart  0-9 Units Subcutaneous TID WC  . lacosamide (VIMPAT) IV  200 mg Intravenous Q12H  . levETIRAcetam  1,500 mg Intravenous Q12H  . lip balm   Topical BID  . metoprolol  10 mg Intravenous Q6H  . metroNIDAZOLE  500 mg Oral  Q8H  . multivitamin with minerals  1 tablet Oral Daily  . pantoprazole  40 mg Oral BID  . polyethylene glycol  17 g Oral Daily  . saccharomyces boulardii  250 mg Oral BID  . sodium chloride  10-40 mL Intracatheter Q12H  . topiramate  50 mg Oral BID   Continuous Infusions: . 0.9 % NaCl with KCl 20 mEq / L 45 mL/hr at 10/26/13 0500   PRN Meds:.acetaminophen, acetaminophen, alum & mag hydroxide-simeth, diphenhydrAMINE, fentaNYL, hydrALAZINE, LORazepam, magic mouthwash, ondansetron (ZOFRAN) IV, ondansetron, oxyCODONE, sodium chloride, zolpidem  Antibiotics    Anti-infectives   Start     Dose/Rate Route Frequency Ordered Stop   10/26/13 1400  metroNIDAZOLE (FLAGYL) tablet 500 mg     500 mg Oral 3 times per day 10/26/13 0945     10/25/13 1800  metroNIDAZOLE (FLAGYL) IVPB 500 mg  Status:  Discontinued     500 mg 100 mL/hr  over 60 Minutes Intravenous Every 8 hours 10/25/13 1743 10/26/13 0945   10/25/13 1800  cefTRIAXone (ROCEPHIN) 2 g in dextrose 5 % 50 mL IVPB     2 g 100 mL/hr over 30 Minutes Intravenous Every 24 hours 10/25/13 1743     10/24/13 1200  piperacillin-tazobactam (ZOSYN) IVPB 3.375 g  Status:  Discontinued     3.375 g 12.5 mL/hr over 240 Minutes Intravenous Every 8 hours 10/24/13 1114 10/25/13 1742   10/13/13 1800  fluconazole (DIFLUCAN) IVPB 200 mg     200 mg 100 mL/hr over 60 Minutes Intravenous Every 24 hours 10/13/13 1707 10/22/13 1906   10/13/13 1715  ertapenem (INVANZ) 1 g in sodium chloride 0.9 % 50 mL IVPB  Status:  Discontinued     1 g 100 mL/hr over 30 Minutes Intravenous Every 24 hours 10/13/13 1707 10/13/13 1750   10/12/13 1600  fluconazole (DIFLUCAN) tablet 100 mg  Status:  Discontinued     100 mg Oral Daily 10/12/13 1428 10/13/13 1707   10/12/13 1600  sulfamethoxazole-trimethoprim (BACTRIM DS) 800-160 MG per tablet 1 tablet  Status:  Discontinued     1 tablet Oral Daily 10/12/13 1428 10/13/13 1707   10/12/13 1600  ertapenem (INVANZ) 1 g in sodium chloride 0.9 % 50 mL IVPB  Status:  Discontinued     1 g 100 mL/hr over 30 Minutes Intravenous Every 24 hours 10/12/13 1507 10/24/13 1107       Time Spent in minutes   35 minutes   Amila Callies D.O. on 10/29/2013 at 9:29 AM  Between 7am to 7pm - Pager - [phone]  After 7pm go to [email or web address] - password [email or web address]  And look for the night coverage person covering for me after hours  [facility] Office  [phone]    Subjective:   Martin Martin seen and examined today.  Patient has no complaints today. Patient denies dizziness, chest pain, shortness of breath, abdominal pain, N/V/D/C, new weakness, numbess, tingling.    Objective:   Filed Vitals:   10/28/13 1800 10/28/13 2130 10/29/13 0537 10/29/13 0916  BP: 120/89 130/85 126/83 127/80  Pulse: 130 117 112 124  Temp: 97.2 F (36.2 C) 97.8 F (36.6 C)  97.6 F (36.4 C) 97.8 F (36.6 C)  TempSrc: Oral Oral Oral Oral  Resp: 18 18 18 20   Height:      Weight:      SpO2: 96% 97% 95% 100%    Wt Readings from Last 3 Encounters:  10/26/13 88.8 kg (195 lb 12.3 oz)  10/26/13 88.8  kg (195 lb 12.3 oz)  10/26/13 88.8 kg (195 lb 12.3 oz)     Intake/Output Summary (Last 24 hours) at 10/29/13 0929 Last data filed at 10/29/13 0030  Gross per 24 hour  Intake     20 ml  Output   1420 ml  Net  -1400 ml    Exam  General: Well developed, well nourished, NAD, appears stated age  HEENT: NCAT, PERRLA, mucous membranes moist.   Neck: Supple, no JVD, no masses  Cardiovascular: S1 S2 auscultated, Regular rate and rhythm.  Respiratory: Clear to auscultation bilaterally with equal chest rise  Abdomen: Soft, nontender, nondistended, + bowel sounds, hypoactive.  Open wound noted on abdomen, currently clean, no drainage.  Extremities: warm dry without cyanosis clubbing or edema  Neuro: AAOx3, Focal deficits  Skin: Without rashes exudates or nodules  Psych: Normal affect and demeanor with intact judgement and insight  Data Review   Micro Results Recent Results (from the past 240 hour(s))  URINE CULTURE     Status: None   Collection Time    10/20/13  6:30 AM      Result Value Range Status   Specimen Description URINE, CLEAN CATCH   Final   Special Requests NONE   Final   Culture  Setup Time     Final   Value: 10/20/2013 09:23     Performed at [facility] Count     Final   Value: NO GROWTH     Performed at [facility]   Culture     Final   Value: NO GROWTH     Performed at [facility]   Report Status 10/21/2013 FINAL   Final    Radiology Reports Ct Head Wo Contrast  10/22/2013   CLINICAL DATA:  Evaluate for recent seizures. Glioblastoma status post surgery and radiotherapy.  EXAM: CT HEAD WITHOUT CONTRAST  TECHNIQUE: Contiguous axial images were obtained from the base of the skull through the  vertex without contrast.  COMPARISON:  Metabolic brain scan 09/26/2013. MRI brain 09/08/13.  FINDINGS: Left parietal hypodensity at the surgical site with moderate surrounding vasogenic edema. No significant midline shift for impending herniation. As seen on image 23 cross-sectional measurements of 26 x 28 mm. There is slightly less mass effect when compared with prior MRI with regard to regional vasogenic edema. There is no superimposed hemorrhage, midline shift, or new intracranial abnormalities. Unremarkable appearing craniotomy defect.  IMPRESSION: Left parietal lesion appears stable to slightly improved with regard to mass effect and surrounding edema. No new abnormalities are detected on this noncontrast exam. 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   Electronically Signed   By: Davonna Belling M.D.   On: 10/22/2013 14:41   Mr Laqueta Jean WU Contrast  10/24/2013   CLINICAL DATA:  History of glioblastoma multiform and, new onset of recurrent seizures. Evaluate for tumor progression. History of tumor resection and radiotherapy.  EXAM: MRI HEAD WITHOUT AND WITH CONTRAST  TECHNIQUE: Multiplanar, multiecho pulse sequences of the brain and surrounding structures were obtained according to standard protocol without and with intravenous contrast  CONTRAST:  18mL MULTIHANCE GADOBENATE DIMEGLUMINE 529 MG/ML IV SOLN  COMPARISON:  Prior CT from 10/22/2013 and MRI from 09/08/2013.  FINDINGS: The post contrast sequences are somewhat degraded by motion artifact.  Previously identified left parietal mass is decreased in size now measuring approximately 4.9 x 2.9 x 3.4 cm (series 14, image 35). This lesion measured approximately 4.1  x 5.6 x 4.4 cm on prior study. Associated vasogenic edema at these improved with decreased T2/FLAIR signal seen within the adjacent left parietal white matter. There is improved mass effect on the adjacent posterior horn of the left lateral ventricle.  Restricted diffusion seen centrally with numbness mass is present, like related to necrosis and/ or postoperative changes. An irregular peripheral rim hemosiderin deposition and/ or blood products is present about the central cavity of this lesion. There is persistent irregular peripheral rim enhancement about the margins of the lesion, improved as compared to the prior exam.  Sequelae of left parietal craniotomy is seen overlying the mass. There is no extra-axial fluid collection.  No other abnormal foci of restricted diffusion are seen to suggest acute intracranial infarct. Normal flow voids are seen within the intracranial vasculature. No other mass lesion is identified. No mass effect is now seen within the brain. No other abnormal enhancement identified.  IMPRESSION: 1. Interval decrease in size of left parietal mass with improved vasogenic edema and resolved midline shift as compared to prior MRI from 09/08/2013. Persistent peripheral rim enhancement about this lesion may represent residual tumor versus radiation necrosis. 2. No other acute intracranial process identified within the brain.   Electronically Signed   By: Rise Mu M.D.   On: 10/24/2013 16:50   Ct Abdomen Pelvis W Contrast  10/23/2013   CLINICAL DATA:  Abdominal pain. Diverticulitis with colonic perforation.  EXAM: CT ABDOMEN AND PELVIS WITH CONTRAST  TECHNIQUE: Multidetector CT imaging of the abdomen and pelvis was performed using the standard protocol following bolus administration of intravenous contrast.  CONTRAST:  OMNIPAQUE IOHEXOL 300 MG/ML  SOLN  COMPARISON:  10/16/2013  FINDINGS: Atelectasis is identified within both lung bases. No focal liver abnormality identified. The gallbladder appears normal. No biliary dilatation. Normal appearance of the pancreas. The spleen is unremarkable.  The adrenal glands are both normal. Normal appearance of the right kidney. Left renal cyst is again identified. Nonobstructing calculus  within the inferior pole of the left kidney measures 5 mm, image 51/ series 2. The urinary bladder appears within normal limits. Prostate gland and seminal vesicles are negative.  Normal caliber of the abdominal aorta. No upper abdominal adenopathy noted. There is no pelvic or inguinal adenopathy.  The stomach appears normal. Small bowel loops appear mildly increased in caliber compatible with the clinical history of ileus. Again noted is extra luminal gas within the soft tissues of the left upper quadrant of the abdomen. Gas can be seen tracking around the stomach and into the posterior mediastinum. This appears similar to previous exam.  Multiple fluid collections are again noted within the upper abdomen. Index fluid collection ventral to the duodenum measures 4.1 cm, image 45/ series 2. Previously 3.7 cm. Index fluid collection posterior to the descending colon measures 4.8 cm, image 50/ series 2. This is compared with 5.6 cm previously. Fluid collection within the root of the mesenteric measures 4.1 x 1.9 cm, image 53/ series 2. Previously 5.2 x 2.8 cm. No new fluid collections identified. Similar appearance of percutaneous to drainage catheters within the central portion of the lower abdomen.  Review of the visualized osseous structures is significant for lumbar spondylosis. No aggressive lytic or sclerotic bone lesions.  IMPRESSION: 1. Multi focal fluid collections are again noted within the abdomen. When compared with the previous exam these are slightly decreased in size in the interval. No new or enlarging fluid collections identified.  2. Mild increased caliber of the small  bowel loops compatible with ileus. No specific features identified to suggest high-grade bowel obstruction.  3. Again noted is extraluminal gas within the soft tissues of the left upper quadrant of the abdomen and extending into the posterior mediastinum. Findings a compatible with known perforated viscus.   Electronically Signed   By:  Signa Kell M.D.   On: 10/23/2013 15:35   Ct Abdomen Pelvis W Contrast  10/16/2013   CLINICAL DATA:  Chronic perforation post surgery and drain placement, abdominal distention, question persistent extraluminal gas  EXAM: CT ABDOMEN AND PELVIS WITH CONTRAST  TECHNIQUE: Multidetector CT imaging of the abdomen and pelvis was performed using the standard protocol following bolus administration of intravenous contrast. Sagittal and coronal MPR images reconstructed from axial data set.  CONTRAST:  50mL OMNIPAQUE IOHEXOL 300 MG/ML SOLN, OMNIPAQUE IOHEXOL 300 MG/ML SOLN  COMPARISON:  10/12/2013  FINDINGS: Bibasilar atelectasis and minimal left pleural effusion.  Nasogastric tube extends into the 2nd portion of duodenum.  Multiple surgical drains present in abdomen, new.  Focus of air within urinary bladder likely reflects prior catheterization.  Mild fatty infiltration of liver.  4 mm nonobstructing calculus inferior pole left kidney image 45.  Liver, spleen, pancreas, and adrenal glands unremarkable.  Symmetric nephrograms with again identified 3.7 x 3.4 cm left renal cysts.  Extraluminal gas is identified under the left hemidiaphragm, within the mesenteric, an adjacent to the colon from the distal transverse the proximal descending segments consistent with prior colonic perforation.  Again identified thickening of a small bowel loop in the mid abdomen with a small adjacent fluid collection which measures 5.2 x 2.8 cm image 45, previously 5.1 x 3.2 cm.  Additional gas and fluid collection adjacent to the proximal descending colon, 5.6 x 3.8 cm image 43 previously 5.2 x 3.7 cm.  Additional fluid mesenteric fluid collections inferior to the pancreatic head appear stable.  Scattered diverticula of the descending and proximal sigmoid colon.  No evidence of bowel obstruction, with GI contrast present into the ascending colon.  Unremarkable bladder and ureters.  Remaining both bowel loops normal appearance.  No  acute osseous findings.  Surgical clips and infiltration at umbilicus likely reflect interval laparoscopy with note of a tiny umbilical hernia.  IMPRESSION: Again seen foci of extraluminal gas compatible with colonic perforation similar to previous exam.  Interval placement of surgical drains.  Persistent focal collections of fluid with minimal gas are seen adjacent to the ascending colon (minimally larger), inferior to pancreatic head (generally stable), and adjacent to a thickened small bowel loop in the mid abdomen (little changed).  Thickened small bowel loop could be due to ischemia, infection, or inflammatory bowel disease.  No definite new intra-abdominal or intrapelvic abnormalities identified.   Electronically Signed   By: Ulyses Southward M.D.   On: 10/16/2013 18:44   Ct Abdomen Pelvis W Contrast  10/12/2013   CLINICAL DATA:  Severe abdominal pain. History of CNS glioblastoma.  EXAM: CT ABDOMEN AND PELVIS WITH CONTRAST  TECHNIQUE: Multidetector CT imaging of the abdomen and pelvis was performed using the standard protocol following bolus administration of intravenous contrast.  CONTRAST:  OMNIPAQUE IOHEXOL 300 MG/ML  SOLN  COMPARISON:  10/11/2009  FINDINGS: Linear densities at the right lung base are suggestive for atelectasis. There is free intraperitoneal air in the left upper quadrant with air around the distal esophagus. There appears to be some air within the retroperitoneal space. The largest focus of air is around the descending colon and there is  fluid or inflammation in this area. The fluid collection around the descending colon roughly measures 5.2 x 3.7 cm and could represent a site for abscess formation. Findings raise concern for acute diverticulitis in this area. Patient has extensive colonic diverticula. In addition, there is a loop of small bowel in the mid lower abdomen that demonstrates severe wall thickening measuring up to 1.1 cm. There is fluid and edema tracking from this  abnormal loop of bowel into the mesentery above it. There is some fluid and small nodes throughout the central mesentery and adjacent to the duodenum.  No gross abnormality to the liver, gallbladder or portal venous system. No gross abnormality of the pancreas, spleen, adrenal glands. There is mild perinephric stranding or edema around the kidneys which appears chronic. Probable bilateral renal cysts. Calcification in the left kidney lower pole measures 4 mm and suggestive for nonobstructive stone.  No gross abnormality to the prostate, seminal vesicles or urinary bladder. No significant free fluid in the pelvis. No acute bone abnormality.  IMPRESSION: Study is positive for free air and suggestive for a bowel perforation. The largest focus of air is surrounding the descending colon. Patient has multiple colonic diverticula and findings could represent acute diverticulitis. There is fluid around the descending colon which may represent a potential site for abscess formation.  There is a severely thickened loop of small bowel in the mid abdomen. There is extensive edema and fluid tracking superior to this abnormal loop of bowel and towards the central mesentery. The patient is currently receiving chemotherapy and Avastin. Findings could represent a focal area of bowel ischemia from vasculitis. Findings may also associated with an infectious or inflammatory process.  Nonobstructive left kidney stone.  Renal cysts.  These results were called by telephone at the time of interpretation on 10/12/2013 at 12:27 PM to Dr. Rolland Porter , who verbally acknowledged these results.   Electronically Signed   By: Richarda Overlie M.D.   On: 10/12/2013 12:30   Dg Abd 2 Views  10/16/2013   CLINICAL DATA:  Distended abdomen. Status post laparoscopy for possible perforation, no perforation identified at time of surgery.  EXAM: ABDOMEN - 2 VIEW  COMPARISON:  CT of the abdomen and pelvis 10/12/2013.  FINDINGS: Nasogastric tube extends into  the proximal stomach, with side port just distal to the gastroesophageal junction. There is some oral contrast material in the ascending colon and cecum. Gas is noted throughout the colon and there is a small amount of gas in nondilated loops of small bowel. A surgical drain is noted in the pelvis, and skin staples are seen projecting over the left side of the abdomen. Notably, there are some irregular-shaped locular some gas projecting over the left upper quadrant of the abdomen, which are not clearly within either the colon or the stomach, suspicious for residual retroperitoneal gas as demonstrated on prior CT scan 10/12/2013.  IMPRESSION: 1. Probable persistent gas in the upper left retroperitoneum, suspicious for retroperitoneal perforation from the descending colon, as suggested on prior CT scan 10/12/2013. Given the recent negative laparoscopy, but persistently distended abdomen, further evaluation with repeat contrast-enhanced CT of the abdomen and pelvis may provide additional diagnostic information if clinically indicated. 2. Postoperative changes and support apparatus, as above. These results were called by telephone at the time of interpretation on 10/16/2013 at 1:49 PM to Dr. Lodema Pilot , who verbally acknowledged these results.   Electronically Signed   By: Trudie Reed M.D.   On: 10/16/2013 13:54  CBC  Recent Labs Lab 10/23/13 0525 10/25/13 0510 10/26/13 0530  WBC 14.2* 14.5* 11.9*  HGB 11.1* 11.6* 10.5*  HCT 33.2* 34.8* 31.8*  PLT 257 266 237  MCV 97.9 98.6 98.8  MCH 32.7 32.9 32.6  MCHC 33.4 33.3 33.0  RDW 16.7* 16.9* 16.6*  LYMPHSABS 0.7  --   --   MONOABS 1.0  --   --   EOSABS 0.0  --   --   BASOSABS 0.0  --   --     Chemistries   Recent Labs Lab 10/23/13 0525 10/24/13 0605 10/25/13 0510 10/26/13 0530  NA 136 136 133* 136  K 3.8 3.9 4.4 4.0  CL 99 99 96 98  CO2 26 27 25 27   GLUCOSE 141* 141* 174* 147*  BUN 17 15 19 19   CREATININE 0.53 0.51 0.65 0.52   CALCIUM 8.7 9.0 9.1 8.6  MG 1.8  --   --  1.9  AST 30  --   --  47*  ALT 66*  --   --  104*  ALKPHOS 122*  --   --  108  BILITOT 0.2*  --   --  0.2*   ------------------------------------------------------------------------------------------------------------------ estimated creatinine clearance is 111.8 ml/min (by C-G formula based on Cr of 0.52). ------------------------------------------------------------------------------------------------------------------ No results found for this basename: HGBA1C,  in the last 72 hours ------------------------------------------------------------------------------------------------------------------ No results found for this basename: CHOL, HDL, LDLCALC, TRIG, CHOLHDL, LDLDIRECT,  in the last 72 hours ------------------------------------------------------------------------------------------------------------------ No results found for this basename: TSH, T4TOTAL, FREET3, T3FREE, THYROIDAB,  in the last 72 hours ------------------------------------------------------------------------------------------------------------------ No results found for this basename: VITAMINB12, FOLATE, FERRITIN, TIBC, IRON, RETICCTPCT,  in the last 72 hours  Coagulation profile No results found for this basename: INR, PROTIME,  in the last 168 hours  No results found for this basename: DDIMER,  in the last 72 hours  Cardiac Enzymes No results found for this basename: CK, CKMB, TROPONINI, MYOGLOBIN,  in the last 168 hours ------------------------------------------------------------------------------------------------------------------ No components found with this basename: POCBNP,

## 2013-10-29 NOTE — Progress Notes (Signed)
16 Days Post-Op  Subjective: Pt doing well with no complaints  Objective: Vital signs in last 24 hours: Temp:  [97.2 F (36.2 C)-97.8 F (36.6 C)] 97.8 F (36.6 C) (11/09 0916) Pulse Rate:  [112-130] 124 (11/09 0916) Resp:  [18-20] 20 (11/09 0916) BP: (120-130)/(80-89) 127/80 mmHg (11/09 0916) SpO2:  [95 %-100 %] 100 % (11/09 0916) Last BM Date: 10/26/13  Intake/Output from previous day: 11/08 0701 - 11/09 0700 In: 20  Out: 1420 [Urine:1420] Intake/Output this shift: Total I/O In: -  Out: 375 [Urine:375]  General appearance: alert and cooperative GI: soft, non-tender; bowel sounds normal; no masses,  no organomegaly  Lab Results:  No results found for this basename: WBC, HGB, HCT, PLT,  in the last 72 hours BMET No results found for this basename: NA, K, CL, CO2, GLUCOSE, BUN, CREATININE, CALCIUM,  in the last 72 hours PT/INR No results found for this basename: LABPROT, INR,  in the last 72 hours ABG No results found for this basename: PHART, PCO2, PO2, HCO3,  in the last 72 hours  Studies/Results: Ct Head Wo Contrast  10/28/2013   CLINICAL DATA:  Sudden confusion and abnormal speech.  EXAM: CT HEAD WITHOUT CONTRAST  TECHNIQUE: Contiguous axial images were obtained from the base of the skull through the vertex without intravenous contrast.  COMPARISON:  10/22/2013 and 04/17/2013  FINDINGS: There is evidence of an ovoid region of solid and cystic density over the left posterior parietal region in the surgical bed of patient's known GBM unchanged. There is mild adjacent edema without significant change. Ventricles and cisterns are within normal. There is no midline shift and no evidence of acute hemorrhage. There is no evidence of acute infarction. Evidence of a prior left posterior parietal craniotomy. Remainder the exam is unchanged.  IMPRESSION: No acute intracranial findings.  Stable changes over the left posterior prior region in the region of patient's known GBM.    Electronically Signed   By: Marin Olp M.D.   On: 10/28/2013 16:17    Anti-infectives: Anti-infectives   Start     Dose/Rate Route Frequency Ordered Stop   10/26/13 1400  metroNIDAZOLE (FLAGYL) tablet 500 mg     500 mg Oral 3 times per day 10/26/13 0945     10/25/13 1800  metroNIDAZOLE (FLAGYL) IVPB 500 mg  Status:  Discontinued     500 mg 100 mL/hr over 60 Minutes Intravenous Every 8 hours 10/25/13 1743 10/26/13 0945   10/25/13 1800  cefTRIAXone (ROCEPHIN) 2 g in dextrose 5 % 50 mL IVPB     2 g 100 mL/hr over 30 Minutes Intravenous Every 24 hours 10/25/13 1743     10/24/13 1200  piperacillin-tazobactam (ZOSYN) IVPB 3.375 g  Status:  Discontinued     3.375 g 12.5 mL/hr over 240 Minutes Intravenous Every 8 hours 10/24/13 1114 10/25/13 1742   10/13/13 1800  fluconazole (DIFLUCAN) IVPB 200 mg     200 mg 100 mL/hr over 60 Minutes Intravenous Every 24 hours 10/13/13 1707 10/22/13 1906   10/13/13 1715  ertapenem (INVANZ) 1 g in sodium chloride 0.9 % 50 mL IVPB  Status:  Discontinued     1 g 100 mL/hr over 30 Minutes Intravenous Every 24 hours 10/13/13 1707 10/13/13 1750   10/12/13 1600  fluconazole (DIFLUCAN) tablet 100 mg  Status:  Discontinued     100 mg Oral Daily 10/12/13 1428 10/13/13 1707   10/12/13 1600  sulfamethoxazole-trimethoprim (BACTRIM DS) 800-160 MG per tablet 1 tablet  Status:  Discontinued     1 tablet Oral Daily 10/12/13 1428 10/13/13 1707   10/12/13 1600  ertapenem (INVANZ) 1 g in sodium chloride 0.9 % 50 mL IVPB  Status:  Discontinued     1 g 100 mL/hr over 30 Minutes Intravenous Every 24 hours 10/12/13 1507 10/24/13 1107      Assessment/Plan: s/p Procedure(s): DIAGNOSTIC LAPAROSCOPY and Placement of Drains (N/A)  Unsure of drain output since no records in chart.  Will work on getting his Drains out slowly. Pt's bowel function working well [place] as tol   LOS: 17 days    Rosario Jacks., Anne Hahn 10/29/2013

## 2013-10-30 ENCOUNTER — Inpatient Hospital Stay (HOSPITAL_COMMUNITY): Payer: BC Managed Care – PPO

## 2013-10-30 ENCOUNTER — Encounter (HOSPITAL_COMMUNITY): Payer: Self-pay | Admitting: Radiology

## 2013-10-30 DIAGNOSIS — R5381 Other malaise: Secondary | ICD-10-CM

## 2013-10-30 DIAGNOSIS — R569 Unspecified convulsions: Secondary | ICD-10-CM

## 2013-10-30 LAB — CBC
Hemoglobin: 11.9 g/dL — ABNORMAL LOW (ref 13.0–17.0)
MCH: 33.6 pg (ref 26.0–34.0)
MCHC: 33.9 g/dL (ref 30.0–36.0)
MCV: 99.2 fL (ref 78.0–100.0)
RDW: 17.1 % — ABNORMAL HIGH (ref 11.5–15.5)

## 2013-10-30 LAB — GLUCOSE, CAPILLARY

## 2013-10-30 MED ORDER — IOHEXOL 300 MG/ML  SOLN
100.0000 mL | Freq: Once | INTRAMUSCULAR | Status: AC | PRN
  Administered 2013-10-30: 100 mL via INTRAVENOUS

## 2013-10-30 MED ORDER — IOHEXOL 300 MG/ML  SOLN
25.0000 mL | INTRAMUSCULAR | Status: AC
  Administered 2013-10-30 (×2): 25 mL via ORAL

## 2013-10-30 NOTE — Progress Notes (Signed)
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 Slight tachycardia.   Pt has expressed  A desire for no further surgery at this point especially since he refuses colostomy.  He no longer is seeking treatment for his GBM.  I would not operate unless pt develops pain and is willing to proceed. The drain is to be left in place for now,  Continue antibiotics and let him eat for comfort.  Recommend palliative care or Hospice consult.  Will follow along for now.  Pt is coherent as is comfortable.

## 2013-10-30 NOTE — Progress Notes (Signed)
Seen and agreed 10/30/2013 Fredrich Birks PTA [phone] pager [phone] office

## 2013-10-30 NOTE — Progress Notes (Signed)
Noted CT scan results as well as Surgery recommendations. I will hold on pursuing [employer] insurance approval for inpt rehab until see how pt progresses medically over the next 24 to 48 hrs.  Inpt rehab is intense therapy for 3 to 4 sessions per day with goal to discharge home after approximately 2 weeks. No family currently at bedside and patient sleeping comfortably. I will follow up tomorrow. [phone]

## 2013-10-30 NOTE — Progress Notes (Signed)
[facility]                                                                                Patient Demographics  Martin Martin, is a 57 y.o. male, DOB - Sep 16, 1956, JXB:[id number]  Admit date - 10/12/2013   Admitting Physician Ernestene Mention, MD  Outpatient Primary MD for the patient is [name], MD  LOS - 18   Chief Complaint  Patient presents with  . 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 Myna Hidalgo (Oncology) -Continue Keppra 1500 mg twice a day as well as the impact 200 mg twice a day, Topiramate 50mg  BID, and Ativan PRN. -Currently on Decadron 4 mgPO every 8 hours for edema in the brain.  MRI 11/4 confirms Interval decrease in size of left parietal mass with improved vasogenic edema and resolved midline shift as compared to prior MRI. -Will reconsult palliative care for discharge planning.  Diverticulitis of colon with perforation -POD 17 s/p diagnostic laparoscopy and placement of drains -Patient was noted to have wound dehiscence yesterday evening. Surgery has been made aware of this and will see the patient today. Wound does look clean, no purulent discharge. -body fluid cultures: +Ecoli -patient was started on ertapenem and swiched to Zosyn.   -Infectious disease is also following.  -Patient currently on Ceftrixone 2mg  IV daily and flagyl 500mg  TID.   Patient will need 2-4 weeks of antibiotics.  Can be switched to oral flagyl once ileus has resolved. -Patient had NG tube which was taken out on 10/29. Patient was started on clear liquid diet. Surgery is following.   Hyperglycemia-  -Secondary to steroids, will continue on Sliding scale insulin.   Hypertension -continue Catapres patch and  metoprolol.  Leukocytosis -Likely secondary to steroids, patient remains afebrile  Oral thrush -Completed course of Diflucan, continue magic mouth wash  Code Status: DNR Family Communication: None at bedside.   Disposition Plan: Admitted.  CM and SW for SNF placement, ?CIR. Will also reconsult palliative care.  Procedures  Diagnostic laparoscopy  Consults   Surgery Neurology Oncology Palliative care Infectious disease  DVT Prophylaxis  Lovenox   Lab Results  Component Value Date   PLT 213 10/30/2013    Medications  Scheduled Meds: . antiseptic oral rinse  15 mL Mouth Rinse q12n4p  . calcium carbonate  1 tablet Oral Daily  . cefTRIAXone (ROCEPHIN)  IV  2 g Intravenous Q24H  . chlorhexidine  15 mL Mouth Rinse BID  . cloNIDine  0.1 mg Transdermal Weekly  . dexamethasone  4 mg Oral Q8H  . enoxaparin (LOVENOX) injection  40 mg Subcutaneous Q24H  . heparin flush  250 Units Intracatheter Once  . insulin aspart  0-9 Units Subcutaneous TID WC  . lacosamide (VIMPAT) IV  200 mg Intravenous Q12H  . levETIRAcetam  1,500 mg Intravenous Q12H  . lip balm   Topical BID  . metoprolol tartrate  25 mg Oral BID  . metroNIDAZOLE  500 mg Oral Q8H  . multivitamin with minerals  1 tablet Oral Daily  . pantoprazole  40 mg Oral BID  . polyethylene glycol  17 g Oral Daily  . saccharomyces boulardii  250 mg Oral BID  . sodium chloride  10-40 mL Intracatheter Q12H  . topiramate  50 mg Oral BID   Continuous Infusions: . 0.9 % NaCl with KCl 20 mEq / L 45 mL/hr at 10/26/13 0500   PRN Meds:.acetaminophen, acetaminophen, alum & mag hydroxide-simeth, diphenhydrAMINE, fentaNYL, hydrALAZINE, LORazepam, magic mouthwash, ondansetron (ZOFRAN) IV, ondansetron, oxyCODONE, sodium chloride, zolpidem  Antibiotics    Anti-infectives   Start     Dose/Rate Route Frequency Ordered Stop   10/26/13 1400  metroNIDAZOLE (FLAGYL) tablet 500 mg     500 mg Oral 3 times per day 10/26/13 0945     10/25/13 1800   metroNIDAZOLE (FLAGYL) IVPB 500 mg  Status:  Discontinued     500 mg 100 mL/hr over 60 Minutes Intravenous Every 8 hours 10/25/13 1743 10/26/13 0945   10/25/13 1800  cefTRIAXone (ROCEPHIN) 2 g in dextrose 5 % 50 mL IVPB     2 g 100 mL/hr over 30 Minutes Intravenous Every 24 hours 10/25/13 1743     10/24/13 1200  piperacillin-tazobactam (ZOSYN) IVPB 3.375 g  Status:  Discontinued     3.375 g 12.5 mL/hr over 240 Minutes Intravenous Every 8 hours 10/24/13 1114 10/25/13 1742   10/13/13 1800  fluconazole (DIFLUCAN) IVPB 200 mg     200 mg 100 mL/hr over 60 Minutes Intravenous Every 24 hours 10/13/13 1707 10/22/13 1906   10/13/13 1715  ertapenem (INVANZ) 1 g in sodium chloride 0.9 % 50 mL IVPB  Status:  Discontinued     1 g 100 mL/hr over 30 Minutes Intravenous Every 24 hours 10/13/13 1707 10/13/13 1750   10/12/13 1600  fluconazole (DIFLUCAN) tablet 100 mg  Status:  Discontinued     100 mg Oral Daily 10/12/13 1428 10/13/13 1707   10/12/13 1600  sulfamethoxazole-trimethoprim (BACTRIM DS) 800-160 MG per tablet 1 tablet  Status:  Discontinued     1 tablet Oral Daily 10/12/13 1428 10/13/13 1707   10/12/13 1600  ertapenem (INVANZ) 1 g in sodium chloride 0.9 % 50 mL IVPB  Status:  Discontinued     1 g 100 mL/hr over 30 Minutes Intravenous Every 24 hours 10/12/13 1507 10/24/13 1107       Time Spent in minutes   35 minutes   Adar Rase D.O. on 10/30/2013 at 10:28 AM  Between 7am to 7pm - Pager - [phone]  After 7pm go to [email or web address] - password [email or web address]  And look for the night coverage person covering for me after hours  [facility] Office  [phone]    Subjective:   Martin Martin seen and examined today.  Patient has no complaints today. Patient denies dizziness, chest pain, shortness of breath, abdominal pain, N/V/D/C, new weakness, numbess, tingling.    Objective:   Filed Vitals:   10/29/13 2131 10/30/13 0131 10/30/13 0651 10/30/13 1005  BP: 124/83 120/83  122/82 125/85  Pulse: 117 116 112 138  Temp: 97.9 F (36.6 C) 98.1 F (36.7 C) 98.1 F (36.7 C) 97.4 F (36.3 C)  TempSrc: Oral Oral Oral Oral  Resp: 18 20 20 20   Height:      Weight:      SpO2: 100% 98% 100% 97%    Wt Readings from Last 3 Encounters:  10/26/13 88.8 kg (195 lb 12.3 oz)  10/26/13 88.8 kg (195 lb 12.3 oz)  10/26/13 88.8 kg (195 lb 12.3 oz)     Intake/Output Summary (Last 24 hours) at 10/30/13 1028 Last data filed at 10/30/13 0730  Gross per 24 hour  Intake    816 ml  Output   2300 ml  Net  -1484 ml    Exam  General: Well developed, well nourished, NAD, appears stated age  HEENT: NCAT, PERRLA, mucous membranes moist.   Neck: Supple, no JVD, no masses  Cardiovascular: S1 S2 auscultated, Regular rate and rhythm.  Respiratory: Clear to auscultation bilaterally with equal chest rise  Abdomen: Soft, nontender, nondistended, + bowel sounds, hypoactive.  Open wound noted on abdomen, currently clean, no drainage.  Extremities: warm dry without cyanosis clubbing or edema  Neuro: AAOx3, Focal deficits  Skin: Without rashes exudates or nodules  Psych: Normal affect and demeanor with intact judgement and insight  Data Review   Micro Results No results found for this or any previous visit (from the past 240 hour(s)).  Radiology Reports Ct Head Wo Contrast  10/22/2013   CLINICAL DATA:  Evaluate for recent seizures. Glioblastoma status post surgery and radiotherapy.  EXAM: CT HEAD WITHOUT CONTRAST  TECHNIQUE: Contiguous axial images were obtained from the base of the skull through the vertex without contrast.  COMPARISON:  Metabolic brain scan 09/26/2013. MRI brain 09/08/13.  FINDINGS: Left parietal hypodensity at the surgical site with moderate surrounding vasogenic edema. No significant midline shift for impending herniation. As seen on image 23 cross-sectional measurements of 26 x 28 mm. There is slightly less mass effect when compared with prior MRI with  regard to regional vasogenic edema. There is no superimposed hemorrhage, midline shift, or new intracranial abnormalities. Unremarkable appearing craniotomy defect.  IMPRESSION: Left parietal lesion appears stable to slightly improved with regard to mass effect and surrounding edema. No new abnormalities are detected on this noncontrast exam. 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   Electronically Signed   By: Davonna Belling M.D.   On: 10/22/2013 14:41   Mr Laqueta Jean JY Contrast  10/24/2013   CLINICAL DATA:  History of glioblastoma multiform and, new onset of recurrent seizures. Evaluate for tumor progression. History of tumor resection and radiotherapy.  EXAM: MRI HEAD WITHOUT AND WITH CONTRAST  TECHNIQUE: Multiplanar, multiecho pulse sequences of the brain and surrounding structures were obtained according to standard protocol without and with intravenous contrast  CONTRAST:  18mL MULTIHANCE GADOBENATE DIMEGLUMINE 529 MG/ML IV SOLN  COMPARISON:  Prior CT from 10/22/2013 and MRI from 09/08/2013.  FINDINGS: The post contrast sequences are somewhat degraded by motion artifact.  Previously identified left parietal mass is decreased in size now measuring approximately 4.9 x 2.9 x 3.4 cm (series 14, image 35). This lesion measured approximately 4.1 x 5.6 x 4.4 cm on prior study. Associated vasogenic edema at these improved with decreased T2/FLAIR signal seen within the adjacent left parietal white matter. There is improved mass effect on the adjacent posterior horn of the left lateral ventricle. Restricted diffusion seen centrally with numbness mass is present, like related to necrosis and/ or postoperative changes. An irregular peripheral rim hemosiderin deposition and/ or blood products is present about the central cavity of this lesion. There is persistent irregular peripheral rim enhancement about the margins of the lesion, improved as compared to the prior exam.   Sequelae of left parietal craniotomy is seen overlying the mass. There is no  extra-axial fluid collection.  No other abnormal foci of restricted diffusion are seen to suggest acute intracranial infarct. Normal flow voids are seen within the intracranial vasculature. No other mass lesion is identified. No mass effect is now seen within the brain. No other abnormal enhancement identified.  IMPRESSION: 1. Interval decrease in size of left parietal mass with improved vasogenic edema and resolved midline shift as compared to prior MRI from 09/08/2013. Persistent peripheral rim enhancement about this lesion may represent residual tumor versus radiation necrosis. 2. No other acute intracranial process identified within the brain.   Electronically Signed   By: Rise Mu M.D.   On: 10/24/2013 16:50   Ct Abdomen Pelvis W Contrast  10/23/2013   CLINICAL DATA:  Abdominal pain. Diverticulitis with colonic perforation.  EXAM: CT ABDOMEN AND PELVIS WITH CONTRAST  TECHNIQUE: Multidetector CT imaging of the abdomen and pelvis was performed using the standard protocol following bolus administration of intravenous contrast.  CONTRAST:  OMNIPAQUE IOHEXOL 300 MG/ML  SOLN  COMPARISON:  10/16/2013  FINDINGS: Atelectasis is identified within both lung bases. No focal liver abnormality identified. The gallbladder appears normal. No biliary dilatation. Normal appearance of the pancreas. The spleen is unremarkable.  The adrenal glands are both normal. Normal appearance of the right kidney. Left renal cyst is again identified. Nonobstructing calculus within the inferior pole of the left kidney measures 5 mm, image 51/ series 2. The urinary bladder appears within normal limits. Prostate gland and seminal vesicles are negative.  Normal caliber of the abdominal aorta. No upper abdominal adenopathy noted. There is no pelvic or inguinal adenopathy.  The stomach appears normal. Small bowel loops appear mildly increased in caliber  compatible with the clinical history of ileus. Again noted is extra luminal gas within the soft tissues of the left upper quadrant of the abdomen. Gas can be seen tracking around the stomach and into the posterior mediastinum. This appears similar to previous exam.  Multiple fluid collections are again noted within the upper abdomen. Index fluid collection ventral to the duodenum measures 4.1 cm, image 45/ series 2. Previously 3.7 cm. Index fluid collection posterior to the descending colon measures 4.8 cm, image 50/ series 2. This is compared with 5.6 cm previously. Fluid collection within the root of the mesenteric measures 4.1 x 1.9 cm, image 53/ series 2. Previously 5.2 x 2.8 cm. No new fluid collections identified. Similar appearance of percutaneous to drainage catheters within the central portion of the lower abdomen.  Review of the visualized osseous structures is significant for lumbar spondylosis. No aggressive lytic or sclerotic bone lesions.  IMPRESSION: 1. Multi focal fluid collections are again noted within the abdomen. When compared with the previous exam these are slightly decreased in size in the interval. No new or enlarging fluid collections identified.  2. Mild increased caliber of the small bowel loops compatible with ileus. No specific features identified to suggest high-grade bowel obstruction.  3. Again noted is extraluminal gas within the soft tissues of the left upper quadrant of the abdomen and extending into the posterior mediastinum. Findings a compatible with known perforated viscus.   Electronically Signed   By: Signa Kell M.D.   On: 10/23/2013 15:35   Ct Abdomen Pelvis W Contrast  10/16/2013   CLINICAL DATA:  Chronic perforation post surgery and drain placement, abdominal distention, question persistent extraluminal gas  EXAM: CT ABDOMEN AND PELVIS WITH CONTRAST  TECHNIQUE: Multidetector CT imaging of the abdomen and pelvis was performed using the standard  protocol following  bolus administration of intravenous contrast. Sagittal and coronal MPR images reconstructed from axial data set.  CONTRAST:  50mL OMNIPAQUE IOHEXOL 300 MG/ML SOLN, OMNIPAQUE IOHEXOL 300 MG/ML SOLN  COMPARISON:  10/12/2013  FINDINGS: Bibasilar atelectasis and minimal left pleural effusion.  Nasogastric tube extends into the 2nd portion of duodenum.  Multiple surgical drains present in abdomen, new.  Focus of air within urinary bladder likely reflects prior catheterization.  Mild fatty infiltration of liver.  4 mm nonobstructing calculus inferior pole left kidney image 45.  Liver, spleen, pancreas, and adrenal glands unremarkable.  Symmetric nephrograms with again identified 3.7 x 3.4 cm left renal cysts.  Extraluminal gas is identified under the left hemidiaphragm, within the mesenteric, an adjacent to the colon from the distal transverse the proximal descending segments consistent with prior colonic perforation.  Again identified thickening of a small bowel loop in the mid abdomen with a small adjacent fluid collection which measures 5.2 x 2.8 cm image 45, previously 5.1 x 3.2 cm.  Additional gas and fluid collection adjacent to the proximal descending colon, 5.6 x 3.8 cm image 43 previously 5.2 x 3.7 cm.  Additional fluid mesenteric fluid collections inferior to the pancreatic head appear stable.  Scattered diverticula of the descending and proximal sigmoid colon.  No evidence of bowel obstruction, with GI contrast present into the ascending colon.  Unremarkable bladder and ureters.  Remaining both bowel loops normal appearance.  No acute osseous findings.  Surgical clips and infiltration at umbilicus likely reflect interval laparoscopy with note of a tiny umbilical hernia.  IMPRESSION: Again seen foci of extraluminal gas compatible with colonic perforation similar to previous exam.  Interval placement of surgical drains.  Persistent focal collections of fluid with minimal gas are seen adjacent to the  ascending colon (minimally larger), inferior to pancreatic head (generally stable), and adjacent to a thickened small bowel loop in the mid abdomen (little changed).  Thickened small bowel loop could be due to ischemia, infection, or inflammatory bowel disease.  No definite new intra-abdominal or intrapelvic abnormalities identified.   Electronically Signed   By: Ulyses Southward M.D.   On: 10/16/2013 18:44   Ct Abdomen Pelvis W Contrast  10/12/2013   CLINICAL DATA:  Severe abdominal pain. History of CNS glioblastoma.  EXAM: CT ABDOMEN AND PELVIS WITH CONTRAST  TECHNIQUE: Multidetector CT imaging of the abdomen and pelvis was performed using the standard protocol following bolus administration of intravenous contrast.  CONTRAST:  OMNIPAQUE IOHEXOL 300 MG/ML  SOLN  COMPARISON:  10/11/2009  FINDINGS: Linear densities at the right lung base are suggestive for atelectasis. There is free intraperitoneal air in the left upper quadrant with air around the distal esophagus. There appears to be some air within the retroperitoneal space. The largest focus of air is around the descending colon and there is fluid or inflammation in this area. The fluid collection around the descending colon roughly measures 5.2 x 3.7 cm and could represent a site for abscess formation. Findings raise concern for acute diverticulitis in this area. Patient has extensive colonic diverticula. In addition, there is a loop of small bowel in the mid lower abdomen that demonstrates severe wall thickening measuring up to 1.1 cm. There is fluid and edema tracking from this abnormal loop of bowel into the mesentery above it. There is some fluid and small nodes throughout the central mesentery and adjacent to the duodenum.  No gross abnormality to the liver, gallbladder or portal venous system. No gross  abnormality of the pancreas, spleen, adrenal glands. There is mild perinephric stranding or edema around the kidneys which appears chronic. Probable  bilateral renal cysts. Calcification in the left kidney lower pole measures 4 mm and suggestive for nonobstructive stone.  No gross abnormality to the prostate, seminal vesicles or urinary bladder. No significant free fluid in the pelvis. No acute bone abnormality.  IMPRESSION: Study is positive for free air and suggestive for a bowel perforation. The largest focus of air is surrounding the descending colon. Patient has multiple colonic diverticula and findings could represent acute diverticulitis. There is fluid around the descending colon which may represent a potential site for abscess formation.  There is a severely thickened loop of small bowel in the mid abdomen. There is extensive edema and fluid tracking superior to this abnormal loop of bowel and towards the central mesentery. The patient is currently receiving chemotherapy and Avastin. Findings could represent a focal area of bowel ischemia from vasculitis. Findings may also associated with an infectious or inflammatory process.  Nonobstructive left kidney stone.  Renal cysts.  These results were called by telephone at the time of interpretation on 10/12/2013 at 12:27 PM to Dr. Rolland Porter , who verbally acknowledged these results.   Electronically Signed   By: Richarda Overlie M.D.   On: 10/12/2013 12:30   Dg Abd 2 Views  10/16/2013   CLINICAL DATA:  Distended abdomen. Status post laparoscopy for possible perforation, no perforation identified at time of surgery.  EXAM: ABDOMEN - 2 VIEW  COMPARISON:  CT of the abdomen and pelvis 10/12/2013.  FINDINGS: Nasogastric tube extends into the proximal stomach, with side port just distal to the gastroesophageal junction. There is some oral contrast material in the ascending colon and cecum. Gas is noted throughout the colon and there is a small amount of gas in nondilated loops of small bowel. A surgical drain is noted in the pelvis, and skin staples are seen projecting over the left side of the abdomen. Notably,  there are some irregular-shaped locular some gas projecting over the left upper quadrant of the abdomen, which are not clearly within either the colon or the stomach, suspicious for residual retroperitoneal gas as demonstrated on prior CT scan 10/12/2013.  IMPRESSION: 1. Probable persistent gas in the upper left retroperitoneum, suspicious for retroperitoneal perforation from the descending colon, as suggested on prior CT scan 10/12/2013. Given the recent negative laparoscopy, but persistently distended abdomen, further evaluation with repeat contrast-enhanced CT of the abdomen and pelvis may provide additional diagnostic information if clinically indicated. 2. Postoperative changes and support apparatus, as above. These results were called by telephone at the time of interpretation on 10/16/2013 at 1:49 PM to Dr. Lodema Pilot , who verbally acknowledged these results.   Electronically Signed   By: Trudie Reed M.D.   On: 10/16/2013 13:54    CBC  Recent Labs Lab 10/25/13 0510 10/26/13 0530 10/30/13 0529  WBC 14.5* 11.9* 9.5  HGB 11.6* 10.5* 11.9*  HCT 34.8* 31.8* 35.1*  PLT 266 237 213  MCV 98.6 98.8 99.2  MCH 32.9 32.6 33.6  MCHC 33.3 33.0 33.9  RDW 16.9* 16.6* 17.1*    Chemistries   Recent Labs Lab 10/24/13 0605 10/25/13 0510 10/26/13 0530  NA 136 133* 136  K 3.9 4.4 4.0  CL 99 96 98  CO2 27 25 27   GLUCOSE 141* 174* 147*  BUN 15 19 19   CREATININE 0.51 0.65 0.52  CALCIUM 9.0 9.1 8.6  MG  --   --  1.9  AST  --   --  47*  ALT  --   --  104*  ALKPHOS  --   --  108  BILITOT  --   --  0.2*   ------------------------------------------------------------------------------------------------------------------ estimated creatinine clearance is 111.8 ml/min (by C-G formula based on Cr of 0.52). ------------------------------------------------------------------------------------------------------------------ No results found for this basename: HGBA1C,  in the last 72  hours ------------------------------------------------------------------------------------------------------------------ No results found for this basename: CHOL, HDL, LDLCALC, TRIG, CHOLHDL, LDLDIRECT,  in the last 72 hours ------------------------------------------------------------------------------------------------------------------ No results found for this basename: TSH, T4TOTAL, FREET3, T3FREE, THYROIDAB,  in the last 72 hours ------------------------------------------------------------------------------------------------------------------ No results found for this basename: VITAMINB12, FOLATE, FERRITIN, TIBC, IRON, RETICCTPCT,  in the last 72 hours  Coagulation profile No results found for this basename: INR, PROTIME,  in the last 168 hours  No results found for this basename: DDIMER,  in the last 72 hours  Cardiac Enzymes No results found for this basename: CK, CKMB, TROPONINI, MYOGLOBIN,  in the last 168 hours ------------------------------------------------------------------------------------------------------------------ No components found with this basename: POCBNP,

## 2013-10-30 NOTE — Progress Notes (Signed)
Mr. Eckstein had a good weekend. Apparently, there were no seizures. Hopefully adding the Topamax stop this.  Is eating. There is no nausea vomiting. There is no pain. He's had no cough.   Lab work so far today looks pretty good. His blood sugars are very well controlled.  I again had a long talk with him. He was very lucent and clear about his wishes. He again states that he does not want surgery or any treatment for the brain cancer. If he decided that he did want to have something done, I certainly would not be opposed to this. I just want to do what I can to honor his wishes.  His vital signs are all stable. Heart rate is a little bit at 116. Lungs are clear. Cardiac exam tachycardic but regular. There are no murmurs rubs or bruits. Abdomen is somewhat distended. Bowel sounds are present. There is no guarding or rebound tenderness. Extremities shows decreased strength in his legs but symmetric.  I think that rehabilitation as an inpatient would not be a bad idea. I know he wants to go home but it would be nice to get him more functional. If not, then home health PT and OT would also be a nice benefit for him.  We will continue to follow along.  As always, we had an excellent prayer session.   Pete E.  2 timothy 3:12

## 2013-10-30 NOTE — Consult Note (Signed)
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 Catha Gosselin    HPI: Martin Martin is a 57 y.o. male with a history of glioblastoma s/p debulking/biopsy in 03/2013 as well as radiation/temodar. In September he was started on Avastin as well as steroids due to progression with worsening right sided weakness. He was admitted on 10/12/13 with 2-3 day history of abdominal pain with constipation. Workup with perforated viscus with localized peritonitis involving pelvic small bowel loops and possible diverticulitis. He was taken to OR on 10/13/13 for laparoscopy, extensive abdominal and pelvic lavage, placement of pelvic drain, lower abdominal drain, and left paracolic drain. Post op with prolonged ileus requiring NGT and TNA..  Cultures positive for E coli. He was placed on ertapenum and changed to piptazo due to having increased seizures.  MRI brain done showing decrease in edema and left parietal mass. Neurology consulted and  Keppra was increased. Steroids had been decreased to help with healing but patient developed daily seizures therefore increased to 4 mg tid.  Vimpat and topamax added with resolution of seizures. Dr. Drue Second consulted for input and recommended changing patient to ceftriaxone 2gm IV daily plus metronidazole 500mg  IV TID  NGT removed on 10/29 and patient has been tolerating clear liquid diet. He has had some dehiscence of abdominal wound treated with wet to dry dressing changes. Therapies ongoing and patient with visual deficits as well as poor safety awareness with unsteady gait and problems sequencing. MD, PT, OT recommending CIR.   Review of Systems  Constitutional: Negative for fever and chills.  Eyes: Negative for blurred vision and double vision.  Cardiovascular: Negative for chest pain and palpitations.  Musculoskeletal: Positive for back pain.   Neurological: Positive for sensory change and focal weakness. Negative for headaches.   Past Medical History  Diagnosis Date  . Hyperlipidemia   . Right bundle branch block     Normal LV function normal aortic and mitral valve bedside echocardiogram 4 2012  . Tachycardia     Resolved  . Dyslipidemia   . Coronary artery disease (CAD) excluded     Cardiolite study 2006 within normal limits  . Hypertension   . Allergy   . Cancer    Past Surgical History  Procedure Laterality Date  . Left inguinal hernia.  2004  . Craniotomy Left 03/29/2013    Procedure: CRANIOTOMY TUMOR EXCISION;  Surgeon: Karn Cassis, MD;  Location: [facility];  Service: Neurosurgery;  Laterality: Left;  Left Craniotomy for tumor excision  . Colon resection N/A 10/13/2013    Procedure: DIAGNOSTIC LAPAROSCOPY and Placement of Drains;  Surgeon: Ernestene Mention, MD;  Location: [facility];  Service: General;  Laterality: N/A;   Family History  Problem Relation Age of Onset  . Diverticulitis Father   . Alzheimer's disease Mother    Social History:  reports that he quit smoking about 8 years ago. His smoking use included Cigarettes. He has a 30 pack-year smoking history. He has never used smokeless tobacco. He reports that he drinks alcohol. He reports that he does not use illicit drugs.  Allergies  Allergen Reactions  . Dilaudid [Hydromorphone Hcl] Nausea And Vomiting  . Morphine And Related Nausea And Vomiting   Medications Prior to Admission  Medication Sig Dispense Refill  . ALPRAZolam (XANAX) 0.25 MG tablet Take 1 tablet (0.25 mg total) by mouth at bedtime as needed for sleep.  30 tablet  0  . calcium carbonate (TUMS - DOSED IN MG ELEMENTAL CALCIUM) 500 MG chewable tablet Chew 1 tablet by mouth daily. For indigestion      . dexamethasone (DECADRON) 4 MG tablet Take 1 tablet (4 mg total) by mouth every 6 (six) hours. Take 1 pill twice daily for 2 weeks then 1/2 pill twice daily until further instructions  120  tablet  1  . diltiazem (CARDIZEM CD) 120 MG 24 hr capsule Take 1 capsule (120 mg total) by mouth daily.  90 capsule  3  . levETIRAcetam (KEPPRA) 500 MG tablet Take 500 mg by mouth at bedtime. Changed by Dr. Jeral Fruit 04/11/13      . metoprolol tartrate (LOPRESSOR) 25 MG tablet Take 25 mg by mouth 2 (two) times daily.       . pantoprazole (PROTONIX) 40 MG tablet Take 40 mg by mouth daily. take 1 tablet by mouth once daily      . sulfamethoxazole-trimethoprim (BACTRIM DS,SEPTRA DS) 800-160 MG per tablet Take 1 pill daily  30 tablet  5  . fluconazole (DIFLUCAN) 100 MG tablet Take 100 mg by mouth daily.        Home: Home Living Family/patient expects to be discharged to:: Private residence Living Arrangements: Spouse/significant other Available Help at Discharge: Friend(s) Type of Home: House Home Access: Stairs to enter [employer] of Steps: 2 STE Entrance Stairs-Rails: None Home Layout: One level;Other (Comment) (Basement w/ laundry) Home Equipment: Grab bars - tub/shower  Functional History:   Functional Status:  Mobility: Bed Mobility Bed Mobility: Sit to Supine;Supine to Sit Supine to Sit: 4: Min assist Sitting - Scoot to Edge of Bed: 4: Min assist Sit to Supine: 4: Min guard Transfers Transfers: Sit to Stand;Stand to Sit Sit to Stand: 4: Min assist;4: Min guard;With upper extremity assist;From bed;From chair/3-in-1 Stand to Sit: 4: Min assist;To bed;To chair/3-in-1 Ambulation/Gait Ambulation/Gait Assistance: 2: Max assist Ambulation Distance (Feet): 14 Feet Assistive device: Rolling walker Ambulation/Gait Assistance Details: patient significantly unsteady, unable to sequence ambulation  Gait Pattern: Step-to pattern;Shuffle;Trunk flexed;Narrow base of support Stairs: No    ADL: ADL Eating/Feeding: Minimal assistance Where Assessed - Eating/Feeding: Bed level Grooming: Wash/dry face;Minimal assistance Where Assessed - Grooming: Supported sitting;Supine, head of  bed up Upper Body Bathing: Simulated;Set up;Min guard Where Assessed - Upper Body Bathing: Supported sitting Lower Body Bathing: Simulated;Minimal assistance Where Assessed - Lower Body Bathing: Supported sit to stand Upper Body Dressing: Performed;Minimal assistance Where Assessed - Upper Body Dressing: Supported sitting;Supported sit to stand Lower Body Dressing: Simulated;Moderate assistance Where Assessed - Lower Body Dressing: Supported sit to stand Toilet Transfer: Charity fundraiser Method: Sit to Barista: Gaffer Method: Not assessed Equipment Used: Gait belt;Rolling walker Transfers/Ambulation Related to ADLs: Mod/Max A for ambulation. Min A/Min guard for transfers. ADL Comments: Pt ambulated to sink to perform grooming tasks, but requiring a lot of assistance for ambulation with walker. Pt stood at sink and said repeatedly that he could not see anything in front of him. Pt requiring hand over hand assitance to initiate washing face. OT assisted in helping pt put toothpaste on toothbrush to prepare to brush teeth, but pt never brushed teeth and became tearful during session. Ambulated back to bed. Pt crying in bed. OT assisted in feeding-assisted in initiation of bringing fork to mouth.   Cognition: Cognition Overall Cognitive Status: Impaired/Different from baseline Orientation Level: Oriented to person;Oriented to place;Oriented to situation Cognition Arousal/Alertness: Awake/alert  Behavior During Therapy: [facility] for tasks assessed/performed;Flat affect Overall Cognitive Status: Impaired/Different from baseline Area of Impairment: Orientation;Following commands;Safety/judgement;Problem solving Orientation Level: Disoriented to;Time Following Commands: Follows one step commands inconsistently Safety/Judgement: Decreased awareness of safety;Decreased awareness of deficits Awareness: Anticipatory Problem  Solving: Slow processing;Decreased initiation;Difficulty sequencing;Requires verbal cues;Requires tactile cues General Comments: Multiple cues and assistance required for ambulation with walker and to step closer to sink. Pt required cues and assistance for grooming tasks.  Blood pressure 125/85, pulse 138, temperature 97.4 F (36.3 C), temperature source Oral, resp. rate 20, height 6' (1.829 m), weight 88.8 kg (195 lb 12.3 oz), SpO2 97.00%. Physical Exam  Constitutional: He is oriented to person, place, and time. He appears well-developed and well-nourished.  HENT:  Right Ear: External ear normal.  Left Ear: External ear normal.  Nose: Nose normal.  Mouth/Throat: Oropharynx is clear and moist.  Eyes: Conjunctivae and EOM are normal. Pupils are equal, round, and reactive to light.  Neck: No JVD present. No tracheal deviation present. No thyromegaly present.  Cardiovascular: Normal rate and regular rhythm.   Respiratory: Effort normal. No respiratory distress. He has no wheezes.  GI: He exhibits no distension.  Musculoskeletal: He exhibits no edema and no tenderness.  Lymphadenopathy:    He has no cervical adenopathy.  Neurological: He is alert and oriented to person, place, and time.  A little delay with processing but generally appropriate. Strength 4/5 RUE and 5/5 LUE. RLE is grossly 4+/5. LLE is 5/5. No sensory differences. [place] slightly decreased on right. Was able to feed self with the right hand.   Psychiatric: He has a normal mood and affect. His behavior is normal. Judgment normal.    Results for orders placed during the hospital encounter of 10/12/13 (from the past 24 hour(s))  GLUCOSE, CAPILLARY     Status: Abnormal   Collection Time    10/29/13 11:15 AM      Result Value Range   Glucose-Capillary 130 (*) 70 - 99 mg/dL  GLUCOSE, CAPILLARY     Status: Abnormal   Collection Time    10/29/13  9:30 PM      Result Value Range   Glucose-Capillary 126 (*) 70 - 99 mg/dL  CBC      Status: Abnormal   Collection Time    10/30/13  5:29 AM      Result Value Range   WBC 9.5  4.0 - 10.5 K/uL   RBC 3.54 (*) 4.22 - 5.81 MIL/uL   Hemoglobin 11.9 (*) 13.0 - 17.0 g/dL   HCT 35.1 (*) 39.0 - 52.0 %   MCV 99.2  78.0 - 100.0 fL   MCH 33.6  26.0 - 34.0 pg   MCHC 33.9  30.0 - 36.0 g/dL   RDW 17.1 (*) 11.5 - 15.5 %   Platelets 213  150 - 400 K/uL  GLUCOSE, CAPILLARY     Status: Abnormal   Collection Time    10/30/13  6:39 AM      Result Value Range   Glucose-Capillary 159 (*) 70 - 99 mg/dL   Ct Head Wo Contrast  10/28/2013   CLINICAL DATA:  Sudden confusion and abnormal speech.  EXAM: CT HEAD WITHOUT CONTRAST  TECHNIQUE: Contiguous axial images were obtained from the base of the skull through the vertex without intravenous contrast.  COMPARISON:  10/22/2013 and 04/17/2013  FINDINGS: There is evidence of an ovoid region of solid and cystic density over the left posterior parietal region in the surgical bed of patient's known  GBM unchanged. There is mild adjacent edema without significant change. Ventricles and cisterns are within normal. There is no midline shift and no evidence of acute hemorrhage. There is no evidence of acute infarction. Evidence of a prior left posterior parietal craniotomy. Remainder the exam is unchanged.  IMPRESSION: No acute intracranial findings.  Stable changes over the left posterior prior region in the region of patient's known GBM.   Electronically Signed   By: Marin Olp M.D.   On: 10/28/2013 16:17    Assessment/Plan: Diagnosis: deconditioning after peritonitis with hx of GBM and right sided hemiparesis 1. Does the need for close, 24 hr/day medical supervision in concert with the patient's rehab needs make it unreasonable for this patient to be served in a less intensive setting? Yes 2. Co-Morbidities requiring supervision/potential complications: seizures, htn,CAD, post-op and ID issues 3. Due to bladder management, bowel management, safety,  skin/wound care, disease management, medication administration, pain management and patient education, does the patient require 24 hr/day rehab nursing? Yes 4. Does the patient require coordinated care of a physician, rehab nurse, PT (1-2 hrs/day, 5 days/week) and OT (1-2 hrs/day, 5 days/week) to address physical and functional deficits in the context of the above medical diagnosis(es)? Yes Addressing deficits in the following areas: balance, endurance, locomotion, strength, transferring, bowel/bladder control, bathing, dressing, feeding, grooming, toileting, cognition and psychosocial support 5. Can the patient actively participate in an intensive therapy program of at least 3 hrs of therapy per day at least 5 days per week? Potentially 6. The potential for patient to make measurable gains while on inpatient rehab is good 7. Anticipated functional outcomes upon discharge from inpatient rehab are supervision to mod I with PT, supervision to mod I with OT, n/a with SLP. 8. Estimated rehab length of stay to reach the above functional goals is: 10-15 days 9. Does the patient have adequate social supports to accommodate these discharge functional goals? Yes and Potentially 10. Anticipated D/C setting: Home 11. Anticipated post D/C treatments: [place] therapy 12. Overall Rehab/Functional Prognosis: excellent  RECOMMENDATIONS: This patient's condition is appropriate for continued rehabilitative care in the following setting: CIR Patient has agreed to participate in recommended program. Potentially Note that insurance prior authorization may be required for reimbursement for recommended care.  Comment: Rehab RN to follow up. Pt is quite motivated and would do well in our program.    Meredith Staggers, MD, [facility]     10/30/2013

## 2013-10-30 NOTE — Progress Notes (Signed)
UR complete.  Eyva Califano RN, MSN 

## 2013-10-30 NOTE — Progress Notes (Signed)
Recommend an inpt rehab consult to assess if he would be a candidate. Please order if you feel appropriate. [phone]

## 2013-10-30 NOTE — Progress Notes (Signed)
Occupational Therapy Treatment Patient Details Name: Martin Martin MRN: [id number] DOB: 15-Mar-1956 Today's Date: 10/30/2013 Time: 4098-1191 OT Time Calculation (min): 36 min  OT Assessment / Plan / Recommendation  History of present illness Pt admitted w/ dx: Diverticulitis of colon w/ perforation, abdominal pain. Pt w/ h/o Glioblastoma left parietal brain, status post biopsy/debulking in April 2014 followed by radiation and adjuvant chemotherapy consisting of Temodar, currently being managed with Avastin every 2 weeks. He subsequently was started on steroids secondary to progression of disease with right-sided weakness in September 2014. As of 10/24/13, RN staff, pt/family are reporting seizures as well.   OT comments  Pt performed bathing and grooming at sink. Pt with improvement compared to last session. Pt fatigued during ADL tasks at sink. Recommending CIR for additional rehab prior to d/c home.   Follow Up Recommendations  CIR;Supervision/Assistance - 24 hour    Barriers to Discharge       Equipment Recommendations  Other (comment) (defer to next venue)    Recommendations for Other Services    Frequency Min 2X/week   Progress towards OT Goals Progress towards OT goals: Progressing toward goals  Plan Discharge plan remains appropriate    Precautions / Restrictions Precautions Precautions: Fall;Other (comment) Precaution Comments: Seizure Restrictions Weight Bearing Restrictions: No   Pertinent Vitals/Pain Pain in right shoulder and HR in 130's during ADLs-nurse notified. Educated on deep breathing technique.     ADL  Eating/Feeding: Set up;Supervision/safety Where Assessed - Eating/Feeding: Chair Grooming: Wash/dry face;Other (comment) (applying deodorant ) Where Assessed - Grooming: Supported standing;Supported sitting Upper Body Bathing: Min guard Where Assessed - Upper Body Bathing: Supported standing Lower Body Bathing: Minimal assistance Where Assessed - Lower  Body Bathing: Supported sit to stand Lower Body Dressing: +1 Total assistance (socks) Where Assessed - Lower Body Dressing: Supported sitting Toilet Transfer: Minimal assistance Toilet Transfer Method: Sit to Barista: Bedside commode Equipment Used: Gait belt;Rolling walker Transfers/Ambulation Related to ADLs: Min guard for ambulation with walker. Mod A/Min A for transfers. ADL Comments: Pt performed grooming and bathing at sink. Pt with improvement from last session. OT educated on energy conservation techniques as pt was breathing very heavily during ADLs. Educated for deep breathing technique-pt HR in 130's.     OT Diagnosis:    OT Problem List:   OT Treatment Interventions:     OT Goals(current goals can now be found in the care plan section) Acute Rehab OT Goals Patient Stated Goal: get bathed off OT Goal Formulation: With patient Time For Goal Achievement: 11/08/13 Potential to Achieve Goals: Fair ADL Goals Pt Will Perform Eating: with set-up;sitting;with adaptive utensils;with caregiver independent in assisting Pt Will Perform Grooming: with set-up;with caregiver independent in assisting;sitting Pt Will Perform Upper Body Bathing: with set-up;with supervision;sitting;standing Pt Will Perform Lower Body Bathing: with set-up;with supervision;sit to/from stand Pt Will Perform Lower Body Dressing: with min assist;sit to/from stand;with caregiver independent in assisting Pt Will Transfer to Toilet: with min assist;bedside commode;ambulating;stand pivot transfer Pt Will Perform Toileting - Clothing Manipulation and hygiene: with min guard assist;sit to/from stand;with caregiver independent in assisting Pt Will Perform Tub/Shower Transfer: with min assist;tub bench;shower seat;with caregiver independent in assisting;Stand pivot transfer  Visit Information  Last OT Received On: 10/30/13 Assistance Needed: +1 History of Present Illness: Pt admitted w/ dx:  Diverticulitis of colon w/ perforation, abdominal pain. Pt w/ h/o Glioblastoma left parietal brain, status post biopsy/debulking in April 2014 followed by radiation and adjuvant chemotherapy consisting of Temodar, currently being  managed with Avastin every 2 weeks. He subsequently was started on steroids secondary to progression of disease with right-sided weakness in September 2014. As of 10/24/13, RN staff, pt/family are reporting seizures as well.    Subjective Data      Prior Functioning       Cognition  Cognition Arousal/Alertness: Awake/alert Behavior During Therapy: WFL for tasks assessed/performed;Flat affect Overall Cognitive Status: Impaired/Different from baseline Area of Impairment: Orientation;Following commands;Problem solving Orientation Level: Disoriented to;Time Following Commands: Follows one step commands inconsistently Safety/Judgement: Decreased awareness of safety Problem Solving: Slow processing    Mobility  Bed Mobility Bed Mobility:  (sitting on EOB) Transfers Transfers: Sit to Stand;Stand to Sit Sit to Stand: 4: Min assist;3: Mod assist;From bed;From chair/3-in-1 Stand to Sit: 4: Min guard;To chair/3-in-1 Details for Transfer Assistance: Cues for hand placement. Mod A for sit to stand from bed and Min A for sit to stand from [facility].    Exercises      Balance     End of Session OT - End of Session Equipment Utilized During Treatment: Gait belt;Rolling walker Activity Tolerance: Patient limited by fatigue Patient left: in chair;with call bell/phone within reach;with family/visitor present Nurse Communication: Other (comment) (pain in right shoulder and increase HR)  GO     Earlie Raveling OTR/L [phone] 10/30/2013, 5:56 PM

## 2013-10-30 NOTE — Progress Notes (Signed)
Physical Therapy Treatment Patient Details Name: Martin Martin MRN: [id number] DOB: February 04, 1956 Today's Date: 10/30/2013 Time: 0910-0959 PT Time Calculation (min): 49 min  PT Assessment / Plan / Recommendation  History of Present Illness     PT Comments   Pt progressing well with ambulation however still unsteady and requires assistance for safety/balance with RW.  Pt educated on importance of pacing. Pt requires VC's with problem solving with RW when transferring.  Pt fatigued with bathroom functional tasks thus requiring assistance.   Follow Up Recommendations  CIR     Does the patient have the potential to tolerate intense rehabilitation     Barriers to Discharge        Equipment Recommendations       Recommendations for Other Services    Frequency Min 4X/week   Progress towards PT Goals Progress towards PT goals: Progressing toward goals  Plan Current plan remains appropriate    Precautions / Restrictions Precautions Precautions: Fall;Other (comment) Precaution Comments: Seizure Restrictions Weight Bearing Restrictions: No   Pertinent Vitals/Pain Denied pain today    Mobility  Bed Mobility Bed Mobility: Rolling Right;Right Sidelying to Sit Rolling Right: 4: Min guard Right Sidelying to Sit: 4: Min assist Sitting - Scoot to Edge of Bed: 4: Min guard Details for Bed Mobility Assistance: Pt required cues for technique and assistance to come to sitting Transfers Transfers: Sit to Stand;Stand to Sit Sit to Stand: With upper extremity assist;From bed;From chair/3-in-1;From toilet;1: +2 Total assist;4: Min assist Sit to Stand: Patient Percentage: 50% Stand to Sit: To chair/3-in-1;To toilet;With upper extremity assist;4: Min assist Details for Transfer Assistance: Pt fatigued from ambulation requiring +2A to come to standing from toilet.  VC's for hand placement with transfers Ambulation/Gait Ambulation/Gait Assistance: 4: Min guard Ambulation Distance (Feet): 150  Feet Assistive device: Rolling walker Ambulation/Gait Assistance Details: Pt required cues with turning for safe technique to stay closer to walker; cues for upright posture. Pt educated on pacing Gait Pattern: Step-through pattern;Decreased stride length;Narrow base of support Stairs: No    Exercises     PT Diagnosis:    PT Problem List:   PT Treatment Interventions:     PT Goals (current goals can now be found in the care plan section)    Visit Information  Last PT Received On: 10/30/13 Assistance Needed: +1    Subjective Data      Cognition  Cognition Arousal/Alertness: Awake/alert Behavior During Therapy: [facility] for tasks assessed/performed;Flat affect Overall Cognitive Status: No family/caregiver present to determine baseline cognitive functioning Area of Impairment: Orientation;Following commands;Safety/judgement;Problem solving Safety/Judgement: Decreased awareness of safety;Decreased awareness of deficits Problem Solving: Slow processing;Decreased initiation;Difficulty sequencing;Requires verbal cues;Requires tactile cues    Balance     End of Session PT - End of Session Equipment Utilized During Treatment: Gait belt Activity Tolerance: Patient tolerated treatment well Patient left: in chair;with call bell/phone within reach;with family/visitor present;with nursing/sitter in room Nurse Communication: Mobility status   GP     Ernestina Columbia, SPTA 10/30/2013, 11:16 AM

## 2013-10-30 NOTE — Progress Notes (Signed)
17 Days Post-Op  Subjective: No C/O  Tolerating diet. Bowels moving.  Objective: Vital signs in last 24 hours: Temp:  [97.9 F (36.6 C)-98.1 F (36.7 C)] 98.1 F (36.7 C) (11/10 0651) Pulse Rate:  [112-117] 112 (11/10 0651) Resp:  [18-20] 20 (11/10 0651) BP: (120-124)/(82-83) 122/82 mmHg (11/10 0651) SpO2:  [98 %-100 %] 100 % (11/10 0651) Last BM Date: 10/29/13  Intake/Output from previous day: 11/09 0701 - 11/10 0700 In: 250 [P.O.:240] Out: 2675 [Urine:2675] Intake/Output this shift: Total I/O In: 566 [P.O.:566] Out: -   Incision/Wound:tube drainage not documented.  Yellowish and thick soft non tender.  Lab Results:   Recent Labs  10/30/13 0529  WBC 9.5  HGB 11.9*  HCT 35.1*  PLT 213   BMET No results found for this basename: NA, K, CL, CO2, GLUCOSE, BUN, CREATININE, CALCIUM,  in the last 72 hours PT/INR No results found for this basename: LABPROT, INR,  in the last 72 hours ABG No results found for this basename: PHART, PCO2, PO2, HCO3,  in the last 72 hours  Studies/Results: Ct Head Wo Contrast  10/28/2013   CLINICAL DATA:  Sudden confusion and abnormal speech.  EXAM: CT HEAD WITHOUT CONTRAST  TECHNIQUE: Contiguous axial images were obtained from the base of the skull through the vertex without intravenous contrast.  COMPARISON:  10/22/2013 and 04/17/2013  FINDINGS: There is evidence of an ovoid region of solid and cystic density over the left posterior parietal region in the surgical bed of patient's known GBM unchanged. There is mild adjacent edema without significant change. Ventricles and cisterns are within normal. There is no midline shift and no evidence of acute hemorrhage. There is no evidence of acute infarction. Evidence of a prior left posterior parietal craniotomy. Remainder the exam is unchanged.  IMPRESSION: No acute intracranial findings.  Stable changes over the left posterior prior region in the region of patient's known GBM.   Electronically Signed    By: Marin Olp M.D.   On: 10/28/2013 16:17    Anti-infectives: Anti-infectives   Start     Dose/Rate Route Frequency Ordered Stop   10/26/13 1400  metroNIDAZOLE (FLAGYL) tablet 500 mg     500 mg Oral 3 times per day 10/26/13 0945     10/25/13 1800  metroNIDAZOLE (FLAGYL) IVPB 500 mg  Status:  Discontinued     500 mg 100 mL/hr over 60 Minutes Intravenous Every 8 hours 10/25/13 1743 10/26/13 0945   10/25/13 1800  cefTRIAXone (ROCEPHIN) 2 g in dextrose 5 % 50 mL IVPB     2 g 100 mL/hr over 30 Minutes Intravenous Every 24 hours 10/25/13 1743     10/24/13 1200  piperacillin-tazobactam (ZOSYN) IVPB 3.375 g  Status:  Discontinued     3.375 g 12.5 mL/hr over 240 Minutes Intravenous Every 8 hours 10/24/13 1114 10/25/13 1742   10/13/13 1800  fluconazole (DIFLUCAN) IVPB 200 mg     200 mg 100 mL/hr over 60 Minutes Intravenous Every 24 hours 10/13/13 1707 10/22/13 1906   10/13/13 1715  ertapenem (INVANZ) 1 g in sodium chloride 0.9 % 50 mL IVPB  Status:  Discontinued     1 g 100 mL/hr over 30 Minutes Intravenous Every 24 hours 10/13/13 1707 10/13/13 1750   10/12/13 1600  fluconazole (DIFLUCAN) tablet 100 mg  Status:  Discontinued     100 mg Oral Daily 10/12/13 1428 10/13/13 1707   10/12/13 1600  sulfamethoxazole-trimethoprim (BACTRIM DS) 800-160 MG per tablet 1 tablet  Status:  Discontinued     1 tablet Oral Daily 10/12/13 1428 10/13/13 1707   10/12/13 1600  ertapenem (INVANZ) 1 g in sodium chloride 0.9 % 50 mL IVPB  Status:  Discontinued     1 g 100 mL/hr over 30 Minutes Intravenous Every 24 hours 10/12/13 1507 10/24/13 1107      Assessment/Plan: s/p Procedure(s): DIAGNOSTIC LAPAROSCOPY and Placement of Drains (N/A) Repeat CT A/P Remove drains if improved. Doing ok otherwise  LOS: 18 days    Martin Martin A. 10/30/2013

## 2013-10-30 NOTE — Progress Notes (Signed)
[facility]     Total days of antibiotics 19 Day 6 ceftriaxone  Day 6 metronidazole   ( 12 days of ertapenem and 2 days piptazo)    Subjective: No new complaints   Antibiotics:  Anti-infectives   Start     Dose/Rate Route Frequency Ordered Stop   10/26/13 1400  metroNIDAZOLE (FLAGYL) tablet 500 mg     500 mg Oral 3 times per day 10/26/13 0945     10/25/13 1800  metroNIDAZOLE (FLAGYL) IVPB 500 mg  Status:  Discontinued     500 mg 100 mL/hr over 60 Minutes Intravenous Every 8 hours 10/25/13 1743 10/26/13 0945   10/25/13 1800  cefTRIAXone (ROCEPHIN) 2 g in dextrose 5 % 50 mL IVPB     2 g 100 mL/hr over 30 Minutes Intravenous Every 24 hours 10/25/13 1743     10/24/13 1200  piperacillin-tazobactam (ZOSYN) IVPB 3.375 g  Status:  Discontinued     3.375 g 12.5 mL/hr over 240 Minutes Intravenous Every 8 hours 10/24/13 1114 10/25/13 1742   10/13/13 1800  fluconazole (DIFLUCAN) IVPB 200 mg     200 mg 100 mL/hr over 60 Minutes Intravenous Every 24 hours 10/13/13 1707 10/22/13 1906   10/13/13 1715  ertapenem (INVANZ) 1 g in sodium chloride 0.9 % 50 mL IVPB  Status:  Discontinued     1 g 100 mL/hr over 30 Minutes Intravenous Every 24 hours 10/13/13 1707 10/13/13 1750   10/12/13 1600  fluconazole (DIFLUCAN) tablet 100 mg  Status:  Discontinued     100 mg Oral Daily 10/12/13 1428 10/13/13 1707   10/12/13 1600  sulfamethoxazole-trimethoprim (BACTRIM DS) 800-160 MG per tablet 1 tablet  Status:  Discontinued     1 tablet Oral Daily 10/12/13 1428 10/13/13 1707   10/12/13 1600  ertapenem (INVANZ) 1 g in sodium chloride 0.9 % 50 mL IVPB  Status:  Discontinued     1 g 100 mL/hr over 30 Minutes Intravenous Every 24 hours 10/12/13 1507 10/24/13 1107      Medications: Scheduled Meds: . antiseptic oral rinse  15 mL Mouth Rinse q12n4p  . calcium carbonate  1 tablet Oral Daily  . cefTRIAXone (ROCEPHIN)  IV  2 g Intravenous Q24H  . chlorhexidine  15 mL Mouth  Rinse BID  . cloNIDine  0.1 mg Transdermal Weekly  . dexamethasone  4 mg Oral Q8H  . enoxaparin (LOVENOX) injection  40 mg Subcutaneous Q24H  . heparin flush  250 Units Intracatheter Once  . insulin aspart  0-9 Units Subcutaneous TID WC  . lacosamide (VIMPAT) IV  200 mg Intravenous Q12H  . levETIRAcetam  1,500 mg Intravenous Q12H  . lip balm   Topical BID  . metoprolol tartrate  25 mg Oral BID  . metroNIDAZOLE  500 mg Oral Q8H  . multivitamin with minerals  1 tablet Oral Daily  . pantoprazole  40 mg Oral BID  . polyethylene glycol  17 g Oral Daily  . saccharomyces boulardii  250 mg Oral BID  . sodium chloride  10-40 mL Intracatheter Q12H  . topiramate  50 mg Oral BID   Continuous Infusions: . 0.9 % NaCl with KCl 20 mEq / L 45 mL/hr at 10/26/13 0500   PRN Meds:.acetaminophen, acetaminophen, alum & mag hydroxide-simeth, diphenhydrAMINE, fentaNYL, hydrALAZINE, LORazepam, magic mouthwash, ondansetron (ZOFRAN) IV, ondansetron, oxyCODONE, sodium chloride, zolpidem   Objective: Weight change:   Intake/Output Summary (Last 24 hours) at 10/30/13 1436 Last data filed at  10/30/13 1255  Gross per 24 hour  Intake   1581 ml  Output   2370 ml  Net   -789 ml   Blood pressure 129/88, pulse 118, temperature 98 F (36.7 C), temperature source Axillary, resp. rate 19, height 6' (1.829 m), weight 195 lb 12.3 oz (88.8 kg), SpO2 100.00%. Temp:  [97.4 F (36.3 C)-98.1 F (36.7 C)] 98 F (36.7 C) (11/10 1249) Pulse Rate:  [112-138] 118 (11/10 1249) Resp:  [18-20] 19 (11/10 1249) BP: (120-129)/(82-88) 129/88 mmHg (11/10 1249) SpO2:  [97 %-100 %] 100 % (11/10 1249)  Physical Exam: General: Alert and awake, oriented eating dinner HEENT: anicteric sclera,  EOMI CVS regular rate, Chest: , no wheezing,  Abdomen:  nondistended, drains one with milky purulent drainage Skin: no rashes Neuro: nonfocal  Lab Results:  Recent Labs  10/30/13 0529  WBC 9.5  HGB 11.9*  HCT 35.1*  PLT 213     BMET No results found for this basename: NA, K, CL, CO2, GLUCOSE, BUN, CREATININE, CALCIUM,  in the last 72 hours  Micro Results: No results found for this or any previous visit (from the past 240 hour(s)).  Studies/Results: Ct Head Wo Contrast  10/28/2013   CLINICAL DATA:  Sudden confusion and abnormal speech.  EXAM: CT HEAD WITHOUT CONTRAST  TECHNIQUE: Contiguous axial images were obtained from the base of the skull through the vertex without intravenous contrast.  COMPARISON:  10/22/2013 and 04/17/2013  FINDINGS: There is evidence of an ovoid region of solid and cystic density over the left posterior parietal region in the surgical bed of patient's known GBM unchanged. There is mild adjacent edema without significant change. Ventricles and cisterns are within normal. There is no midline shift and no evidence of acute hemorrhage. There is no evidence of acute infarction. Evidence of a prior left posterior parietal craniotomy. Remainder the exam is unchanged.  IMPRESSION: No acute intracranial findings.  Stable changes over the left posterior prior region in the region of patient's known GBM.   Electronically Signed   By: Marin Olp M.D.   On: 10/28/2013 16:17      Assessment/Plan: Martin Martin is a 57 y.o. male with with recent dx of glioblastoma presents with diverticulitis/ileus c/b intra-abdominal abscesses, cx sensitive + ecoli isolated. Hospitalization now complicated by poorly controlled seizures.    - continue with ceftriaxone 2gm IV daily plus metronidazole 500mg  orally TID  --fu repeat CT abdomen and pelvis  I would want to make sure that he gets at least 2  weeks of therapy post placement of last drain . Given his immunocomprised status would argue to push for longer course  I noted that his E coli was S to amp/Sulbactam which would make Augmentin a viable oral option for him. His E coli was R to FQ and Bactrim  #2 Screening: I will check HIV and hep panel   LOS:  18 days   Alcide Evener 10/30/2013, 2:36 PM

## 2013-10-31 LAB — GLUCOSE, CAPILLARY: Glucose-Capillary: 111 mg/dL — ABNORMAL HIGH (ref 70–99)

## 2013-10-31 LAB — HEPATITIS PANEL, ACUTE
Hep A IgM: NONREACTIVE
Hep B C IgM: NONREACTIVE

## 2013-10-31 NOTE — Progress Notes (Signed)
I met with Bekim and his two sisters who have been at the bedside to discuss goals of care. Eyal is starting to show signs of possible cognitive effects of his brain tumor and overall condition-I have had a good discussion with him and he is agreeable to a hospice facility. Because of his cognitive issues I also spoke in detail with his wife over the phone. [name] did not return my calls all day, I called this evening several times and she did not answer or return my calls but called his room to see if he was ok-I was able to talk with her when she called the room. She is agreeable to hospice facility and states that she cannot care for him at home. Since his admission she has been largely unavailable, she has not returned providers calls or visited him in the hospital. There is a very tense family situation about his care.   Recs:  1. Pursue Residential Hospice Placement: geographically they are close to [place], patient wants to be close to home which is [employer].   2. Continue IV antibiotics for complete course.  3. His prognosis is extremely poor, I suspect he will die of his intra-abdominal process rather than his glioblastoma. Prognosis is probably <1 month at best given new CT findings.  4. He has pain in his abdomen but is not complaining about it-I anticipate this will get worse-pain was the reason he sought medical care. He is getting Fentanyl PRN and oxycodone.  Will follow closely.  Anderson Malta, DO Palliative Medicine

## 2013-10-31 NOTE — Progress Notes (Signed)
[facility]     Total days of antibiotics 20 Day 7 ceftriaxone  Day 7 metronidazole   ( 12 days of ertapenem and 2 days piptazo)    Subjective: No new complaints   Antibiotics:  Anti-infectives   Start     Dose/Rate Route Frequency Ordered Stop   10/26/13 1400  metroNIDAZOLE (FLAGYL) tablet 500 mg     500 mg Oral 3 times per day 10/26/13 0945     10/25/13 1800  metroNIDAZOLE (FLAGYL) IVPB 500 mg  Status:  Discontinued     500 mg 100 mL/hr over 60 Minutes Intravenous Every 8 hours 10/25/13 1743 10/26/13 0945   10/25/13 1800  cefTRIAXone (ROCEPHIN) 2 g in dextrose 5 % 50 mL IVPB     2 g 100 mL/hr over 30 Minutes Intravenous Every 24 hours 10/25/13 1743     10/24/13 1200  piperacillin-tazobactam (ZOSYN) IVPB 3.375 g  Status:  Discontinued     3.375 g 12.5 mL/hr over 240 Minutes Intravenous Every 8 hours 10/24/13 1114 10/25/13 1742   10/13/13 1800  fluconazole (DIFLUCAN) IVPB 200 mg     200 mg 100 mL/hr over 60 Minutes Intravenous Every 24 hours 10/13/13 1707 10/22/13 1906   10/13/13 1715  ertapenem (INVANZ) 1 g in sodium chloride 0.9 % 50 mL IVPB  Status:  Discontinued     1 g 100 mL/hr over 30 Minutes Intravenous Every 24 hours 10/13/13 1707 10/13/13 1750   10/12/13 1600  fluconazole (DIFLUCAN) tablet 100 mg  Status:  Discontinued     100 mg Oral Daily 10/12/13 1428 10/13/13 1707   10/12/13 1600  sulfamethoxazole-trimethoprim (BACTRIM DS) 800-160 MG per tablet 1 tablet  Status:  Discontinued     1 tablet Oral Daily 10/12/13 1428 10/13/13 1707   10/12/13 1600  ertapenem (INVANZ) 1 g in sodium chloride 0.9 % 50 mL IVPB  Status:  Discontinued     1 g 100 mL/hr over 30 Minutes Intravenous Every 24 hours 10/12/13 1507 10/24/13 1107      Medications: Scheduled Meds: . antiseptic oral rinse  15 mL Mouth Rinse q12n4p  . calcium carbonate  1 tablet Oral Daily  . cefTRIAXone (ROCEPHIN)  IV  2 g Intravenous Q24H  . chlorhexidine  15 mL Mouth  Rinse BID  . cloNIDine  0.1 mg Transdermal Weekly  . dexamethasone  4 mg Oral Q8H  . enoxaparin (LOVENOX) injection  40 mg Subcutaneous Q24H  . heparin flush  250 Units Intracatheter Once  . insulin aspart  0-9 Units Subcutaneous TID WC  . lacosamide (VIMPAT) IV  200 mg Intravenous Q12H  . levETIRAcetam  1,500 mg Intravenous Q12H  . lip balm   Topical BID  . metoprolol tartrate  25 mg Oral BID  . metroNIDAZOLE  500 mg Oral Q8H  . multivitamin with minerals  1 tablet Oral Daily  . pantoprazole  40 mg Oral BID  . polyethylene glycol  17 g Oral Daily  . saccharomyces boulardii  250 mg Oral BID  . sodium chloride  10-40 mL Intracatheter Q12H  . topiramate  50 mg Oral BID   Continuous Infusions: . 0.9 % NaCl with KCl 20 mEq / L 45 mL/hr at 10/31/13 0928   PRN Meds:.acetaminophen, acetaminophen, alum & mag hydroxide-simeth, diphenhydrAMINE, fentaNYL, hydrALAZINE, LORazepam, magic mouthwash, ondansetron (ZOFRAN) IV, ondansetron, oxyCODONE, sodium chloride, zolpidem   Objective: Weight change:   Intake/Output Summary (Last 24 hours) at 10/31/13 1641 Last data filed at  10/31/13 1300  Gross per 24 hour  Intake    660 ml  Output   1450 ml  Net   -790 ml   Blood pressure 113/70, pulse 124, temperature 97.5 F (36.4 C), temperature source Axillary, resp. rate 20, height 6' (1.829 m), weight 195 lb 12.3 oz (88.8 kg), SpO2 99.00%. Temp:  [97.5 F (36.4 C)-98.1 F (36.7 C)] 97.5 F (36.4 C) (11/11 1517) Pulse Rate:  [101-133] 124 (11/11 1517) Resp:  [18-20] 20 (11/11 1517) BP: (103-125)/(70-81) 113/70 mmHg (11/11 1517) SpO2:  [95 %-99 %] 99 % (11/11 1517)  Physical Exam: General: Alert and awake, oriented eating dinner HEENT: anicteric sclera,  EOMI CVS regular rate, Chest: , no wheezing,  Abdomen:  nondistended, drains one with milky purulent drainage Skin: no rashes Neuro: nonfocal  Lab Results:  Recent Labs  10/30/13 0529  WBC 9.5  HGB 11.9*  HCT 35.1*  PLT 213     BMET No results found for this basename: NA, K, CL, CO2, GLUCOSE, BUN, CREATININE, CALCIUM,  in the last 72 hours  Micro Results: No results found for this or any previous visit (from the past 240 hour(s)).  Studies/Results: Ct Abdomen Pelvis W Contrast  10/30/2013   CLINICAL DATA:  Perforated diverticulitis. Followup diverticular abscess following percutaneous catheter drainage.  EXAM: CT ABDOMEN AND PELVIS WITH CONTRAST  TECHNIQUE: Multidetector CT imaging of the abdomen and pelvis was performed using the standard protocol following bolus administration of intravenous contrast.  CONTRAST:  OMNIPAQUE IOHEXOL 300 MG/ML  SOLN  COMPARISON:  10/23/2013  FINDINGS: Two left abdominal percutaneous drains remain in place. Increased pneumatosis is seen involving the transverse colon, and there is increased extraluminal gas seen within the retroperitoneum and mesenteric fat which is predominantly extraperitoneal. Mild wall thickening and extraluminal fluid is again seen surrounding the proximal descending colon which is also involved by diverticulosis, and this could represent a focal area of diverticulitis. No other inflammatory process identified.  Hepatic steatosis noted but no liver masses are identified. Gallbladder, pancreas, spleen, adrenal glands, and right kidney are normal in appearance. Left renal cysts are noted, however there is no evidence of renal masses or hydronephrosis. No soft tissue masses or lymphadenopathy identified.  Increased mild small bowel dilatation is seen without evidence of transition point, and this is consistent with adynamic ileus.  IMPRESSION: Increased transverse colon pneumatosis and extraluminal gas which is probably extraperitoneal. This could be due to perforated diverticulitis involving the proximal sigmoid colon, however ischemic colitis cannot be excluded.  Worsening adynamic ileus.  Critical Value/emergent results were called by telephone at the time of  interpretation on 10/30/2013 at 2:52 PM to the patient's floor nurse Annabelle Harman, who verbally acknowledged these results.   Electronically Signed   By: Myles Rosenthal M.D.   On: 10/30/2013 14:56      Assessment/Plan: Martin Martin is a 57 y.o. male with with recent dx of glioblastoma presents with diverticulitis/ileus c/b intra-abdominal abscesses, cx sensitive + ecoli isolated. Hospitalization now complicated by poorly controlled seizures. CT of the abdomen shows incrased pneumotosis and increased extra-luminal gas, with mild wall thickening in descending colon likely reflecting perforated diverticulitis. Pt does not want to undergo further surgery   - continue with ceftriaxone 2gm IV daily plus metronidazole 500mg  orally TID for now,  BUT  pt could be changed to  Oral augmentin 875/125 bid at dc and would continue this for another month with consideration for repeat CT scan in next 3 weeks prior to stopping  abx  --I think palliative care is critical at this junction  #2 Screening: HIV and hep panel negative  I will arrange tentative [facility] appt should he be dc from the hospital as he was requesting from me.  I will otherwise sign off at this time.  Please call with further questions.   LOS: 19 days   Acey Lav 10/31/2013, 4:41 PM

## 2013-10-31 NOTE — Progress Notes (Signed)
Physical Therapy Treatment Patient Details Name: Martin Martin MRN: [id number] DOB: 10-12-1956 Today's Date: 10/31/2013 Time: 0822-0849 PT Time Calculation (min): 27 min  PT Assessment / Plan / Recommendation  History of Present Illness Pt admitted w/ dx: Diverticulitis of colon w/ perforation, abdominal pain. Pt w/ h/o Glioblastoma left parietal brain, status post biopsy/debulking in April 2014 followed by radiation and adjuvant chemotherapy consisting of Temodar, currently being managed with Avastin every 2 weeks. He subsequently was started on steroids secondary to progression of disease with right-sided weakness in September 2014. As of 10/24/13, RN staff, pt/family are reporting seizures as well.   PT Comments   Pt educated on importance of pacing activities today.  Pt is unsteady with ambulation, but required fewer cues with appropriate use or RW today.  Pt stated that he has about 15 stairs at home that he'll have to use if he goes home.  Pt became fatigued with ambulation thus uncertain if pt will be able to tolerate 15 stairs to make it into bedroom.  Pt states he is unable to stay on first floor.  Will continue to follow up for best plan.  Follow Up Recommendations  CIR     Does the patient have the potential to tolerate intense rehabilitation     Barriers to Discharge        Equipment Recommendations       Recommendations for Other Services    Frequency Min 4X/week   Progress towards PT Goals Progress towards PT goals: Progressing toward goals  Plan Current plan remains appropriate    Precautions / Restrictions Precautions Precautions: Fall;Other (comment) Precaution Comments: Seizure Restrictions Weight Bearing Restrictions: No   Pertinent Vitals/Pain Pt denied pain    Mobility  Bed Mobility Bed Mobility: Rolling Right;Right Sidelying to Sit;Sitting - Scoot to [place] of Bed Rolling Right: 4: Min guard Right Sidelying to Sit: 4: Min assist Sitting - Scoot to [place]  of Bed: 4: Min guard Details for Bed Mobility Assistance: Pt required fewer cues today for technique and assistance to come to sitting Transfers Transfers: Sit to Stand;Stand to Sit Sit to Stand: 4: Min guard;From bed;With upper extremity assist Stand to Sit: 4: Min guard;To chair/3-in-1;With upper extremity assist Details for Transfer Assistance: Cues for hand placement Ambulation/Gait Ambulation/Gait Assistance: 4: Min guard Ambulation Distance (Feet): 200 Feet Assistive device: Rolling walker Ambulation/Gait Assistance Details: Pt educated on pacing during ambulation and all activities; however did not take rest break during ambulation today but fatigued.  Min guard due to pt's weakness and instability Gait Pattern: Step-through pattern;Decreased stride length;Narrow base of support Stairs: No    Exercises     PT Diagnosis:    PT Problem List:   PT Treatment Interventions:     PT Goals (current goals can now be found in the care plan section)    Visit Information  Last PT Received On: 10/31/13 Assistance Needed: +1 History of Present Illness: Pt admitted w/ dx: Diverticulitis of colon w/ perforation, abdominal pain. Pt w/ h/o Glioblastoma left parietal brain, status post biopsy/debulking in April 2014 followed by radiation and adjuvant chemotherapy consisting of Temodar, currently being managed with Avastin every 2 weeks. He subsequently was started on steroids secondary to progression of disease with right-sided weakness in September 2014. As of 10/24/13, RN staff, pt/family are reporting seizures as well.    Subjective Data      Cognition  Cognition Arousal/Alertness: Awake/alert Behavior During Therapy: WFL for tasks assessed/performed;Flat affect    Balance  End of Session PT - End of Session Equipment Utilized During Treatment: Gait belt Activity Tolerance: Patient tolerated treatment well Patient left: in chair;with call bell/phone within reach;with nursing/sitter  in room Nurse Communication: Mobility status   GP     Ernestina Columbia, SPTA 10/31/2013, 8:56 AM

## 2013-10-31 NOTE — Progress Notes (Signed)
18 Days Post-Op  Subjective: Pt without complaint.  No pain tolerating diet.   Objective: Vital signs in last 24 hours: Temp:  [97.4 F (36.3 C)-98 F (36.7 C)] 97.6 F (36.4 C) (11/11 0535) Pulse Rate:  [101-138] 101 (11/11 0535) Resp:  [18-20] 20 (11/11 0535) BP: (103-129)/(72-88) 125/81 mmHg (11/11 0535) SpO2:  [95 %-100 %] 95 % (11/11 0535) Last BM Date: 10/30/13  Intake/Output from previous day: 11/10 0701 - 11/11 0700 In: 1331 [P.O.:1316] Out: 1870 [Urine:1870] Intake/Output this shift:    GI.  Port site ok.  Distension but min tenderness  Lab Results:   Recent Labs  10/30/13 0529  WBC 9.5  HGB 11.9*  HCT 35.1*  PLT 213   BMET No results found for this basename: NA, K, CL, CO2, GLUCOSE, BUN, CREATININE, CALCIUM,  in the last 72 hours PT/INR No results found for this basename: LABPROT, INR,  in the last 72 hours ABG No results found for this basename: PHART, PCO2, PO2, HCO3,  in the last 72 hours  Studies/Results: Ct Abdomen Pelvis W Contrast  10/30/2013   CLINICAL DATA:  Perforated diverticulitis. Followup diverticular abscess following percutaneous catheter drainage.  EXAM: CT ABDOMEN AND PELVIS WITH CONTRAST  TECHNIQUE: Multidetector CT imaging of the abdomen and pelvis was performed using the standard protocol following bolus administration of intravenous contrast.  CONTRAST:  OMNIPAQUE IOHEXOL 300 MG/ML  SOLN  COMPARISON:  10/23/2013  FINDINGS: Two left abdominal percutaneous drains remain in place. Increased pneumatosis is seen involving the transverse colon, and there is increased extraluminal gas seen within the retroperitoneum and mesenteric fat which is predominantly extraperitoneal. Mild wall thickening and extraluminal fluid is again seen surrounding the proximal descending colon which is also involved by diverticulosis, and this could represent a focal area of diverticulitis. No other inflammatory process identified.  Hepatic steatosis noted but no  liver masses are identified. Gallbladder, pancreas, spleen, adrenal glands, and right kidney are normal in appearance. Left renal cysts are noted, however there is no evidence of renal masses or hydronephrosis. No soft tissue masses or lymphadenopathy identified.  Increased mild small bowel dilatation is seen without evidence of transition point, and this is consistent with adynamic ileus.  IMPRESSION: Increased transverse colon pneumatosis and extraluminal gas which is probably extraperitoneal. This could be due to perforated diverticulitis involving the proximal sigmoid colon, however ischemic colitis cannot be excluded.  Worsening adynamic ileus.  Critical Value/emergent results were called by telephone at the time of interpretation on 10/30/2013 at 2:52 PM to the patient's floor nurse Annabelle Harman, who verbally acknowledged these results.   Electronically Signed   By: Myles Rosenthal M.D.   On: 10/30/2013 14:56    Anti-infectives: Anti-infectives   Start     Dose/Rate Route Frequency Ordered Stop   10/26/13 1400  metroNIDAZOLE (FLAGYL) tablet 500 mg     500 mg Oral 3 times per day 10/26/13 0945     10/25/13 1800  metroNIDAZOLE (FLAGYL) IVPB 500 mg  Status:  Discontinued     500 mg 100 mL/hr over 60 Minutes Intravenous Every 8 hours 10/25/13 1743 10/26/13 0945   10/25/13 1800  cefTRIAXone (ROCEPHIN) 2 g in dextrose 5 % 50 mL IVPB     2 g 100 mL/hr over 30 Minutes Intravenous Every 24 hours 10/25/13 1743     10/24/13 1200  piperacillin-tazobactam (ZOSYN) IVPB 3.375 g  Status:  Discontinued     3.375 g 12.5 mL/hr over 240 Minutes Intravenous Every 8 hours 10/24/13 1114  10/25/13 1742   10/13/13 1800  fluconazole (DIFLUCAN) IVPB 200 mg     200 mg 100 mL/hr over 60 Minutes Intravenous Every 24 hours 10/13/13 1707 10/22/13 1906   10/13/13 1715  ertapenem (INVANZ) 1 g in sodium chloride 0.9 % 50 mL IVPB  Status:  Discontinued     1 g 100 mL/hr over 30 Minutes Intravenous Every 24 hours 10/13/13 1707 10/13/13  1750   10/12/13 1600  fluconazole (DIFLUCAN) tablet 100 mg  Status:  Discontinued     100 mg Oral Daily 10/12/13 1428 10/13/13 1707   10/12/13 1600  sulfamethoxazole-trimethoprim (BACTRIM DS) 800-160 MG per tablet 1 tablet  Status:  Discontinued     1 tablet Oral Daily 10/12/13 1428 10/13/13 1707   10/12/13 1600  ertapenem (INVANZ) 1 g in sodium chloride 0.9 % 50 mL IVPB  Status:  Discontinued     1 g 100 mL/hr over 30 Minutes Intravenous Every 24 hours 10/12/13 1507 10/24/13 1107      Assessment/Plan: s/p Procedure(s): DIAGNOSTIC LAPAROSCOPY and Placement of Drains (N/A) Discussed findings with pt today.  He feels well.  He would not survive laparotomy nor colon resection.  Recommend palliative care or Hospice.  Leave drains in place.  Can go home from my standpoint with [facility] for drain care.   LOS: 19 days    Latoshia Monrroy A. 10/31/2013

## 2013-10-31 NOTE — Progress Notes (Signed)
Seen and agreed 10/31/2013 Zurisadai Helminiak Elizabeth PTA [phone] pager [phone] office    

## 2013-10-31 NOTE — Progress Notes (Signed)
[facility]                                                                                Patient Demographics  Martin Martin, is a 57 y.o. male, DOB - 1956/07/19, WUJ:[id number]  Admit date - 10/12/2013   Admitting Physician Ernestene Mention, MD  Outpatient Primary MD for the patient is [name], MD  LOS - 19   Chief Complaint  Patient presents with  . 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 Myna Hidalgo (Oncology) -Continue Keppra 1500 mg twice a day as well as the impact 200 mg twice a day, Topiramate 50mg  BID, and Ativan PRN. -Currently on Decadron 4 mgPO every 8 hours for edema in the brain.  MRI 11/4 confirms Interval decrease in size of left parietal mass with improved vasogenic edema and resolved midline shift as compared to prior MRI. -Will reconsult palliative care for discharge planning.  Diverticulitis of colon with perforation -POD 18 s/p diagnostic laparoscopy and placement of drains -Patient was noted to have wound dehiscence yesterday evening. Surgery has been made aware of this and will see the patient today. Wound does look clean, no purulent discharge. -body fluid cultures: +Ecoli -patient was started on ertapenem and swiched to Zosyn.   -Infectious disease is also following.  -Patient currently on Ceftrixone 2mg  IV daily and flagyl 500mg  TID.   Patient will need 2-4 weeks of antibiotics.  Can be switched to oral flagyl once ileus has resolved. -CT showed possible transverse colon pneunatosis/dilation.  Laparotomy or colon resection were not recommended.    Hyperglycemia -Secondary to steroids, will continue on Sliding scale insulin.   Hypertension -continue Catapres patch and  metoprolol.  Leukocytosis -Likely secondary to steroids, patient remains afebrile  Oral thrush -Completed course of Diflucan, continue magic mouth wash  Code Status: DNR Family Communication: None at bedside.   Disposition Plan: Admitted.  CM and SW for SNF placement, ?CIR. Will also reconsult palliative care.  Procedures  Diagnostic laparoscopy  Consults   Surgery Neurology Oncology Palliative care Infectious disease  DVT Prophylaxis  Lovenox   Lab Results  Component Value Date   PLT 213 10/30/2013    Medications  Scheduled Meds: . antiseptic oral rinse  15 mL Mouth Rinse q12n4p  . calcium carbonate  1 tablet Oral Daily  . cefTRIAXone (ROCEPHIN)  IV  2 g Intravenous Q24H  . chlorhexidine  15 mL Mouth Rinse BID  . cloNIDine  0.1 mg Transdermal Weekly  . dexamethasone  4 mg Oral Q8H  . enoxaparin (LOVENOX) injection  40 mg Subcutaneous Q24H  . heparin flush  250 Units Intracatheter Once  . insulin aspart  0-9 Units Subcutaneous TID WC  . lacosamide (VIMPAT) IV  200 mg Intravenous Q12H  . levETIRAcetam  1,500 mg Intravenous Q12H  . lip balm   Topical BID  . metoprolol tartrate  25 mg Oral BID  .  metroNIDAZOLE  500 mg Oral Q8H  . multivitamin with minerals  1 tablet Oral Daily  . pantoprazole  40 mg Oral BID  . polyethylene glycol  17 g Oral Daily  . saccharomyces boulardii  250 mg Oral BID  . sodium chloride  10-40 mL Intracatheter Q12H  . topiramate  50 mg Oral BID   Continuous Infusions: . 0.9 % NaCl with KCl 20 mEq / L 45 mL/hr at 10/31/13 0928   PRN Meds:.acetaminophen, acetaminophen, alum & mag hydroxide-simeth, diphenhydrAMINE, fentaNYL, hydrALAZINE, LORazepam, magic mouthwash, ondansetron (ZOFRAN) IV, ondansetron, oxyCODONE, sodium chloride, zolpidem  Antibiotics    Anti-infectives   Start     Dose/Rate Route Frequency Ordered Stop   10/26/13 1400  metroNIDAZOLE (FLAGYL) tablet 500 mg     500 mg Oral 3 times per day 10/26/13 0945     10/25/13 1800   metroNIDAZOLE (FLAGYL) IVPB 500 mg  Status:  Discontinued     500 mg 100 mL/hr over 60 Minutes Intravenous Every 8 hours 10/25/13 1743 10/26/13 0945   10/25/13 1800  cefTRIAXone (ROCEPHIN) 2 g in dextrose 5 % 50 mL IVPB     2 g 100 mL/hr over 30 Minutes Intravenous Every 24 hours 10/25/13 1743     10/24/13 1200  piperacillin-tazobactam (ZOSYN) IVPB 3.375 g  Status:  Discontinued     3.375 g 12.5 mL/hr over 240 Minutes Intravenous Every 8 hours 10/24/13 1114 10/25/13 1742   10/13/13 1800  fluconazole (DIFLUCAN) IVPB 200 mg     200 mg 100 mL/hr over 60 Minutes Intravenous Every 24 hours 10/13/13 1707 10/22/13 1906   10/13/13 1715  ertapenem (INVANZ) 1 g in sodium chloride 0.9 % 50 mL IVPB  Status:  Discontinued     1 g 100 mL/hr over 30 Minutes Intravenous Every 24 hours 10/13/13 1707 10/13/13 1750   10/12/13 1600  fluconazole (DIFLUCAN) tablet 100 mg  Status:  Discontinued     100 mg Oral Daily 10/12/13 1428 10/13/13 1707   10/12/13 1600  sulfamethoxazole-trimethoprim (BACTRIM DS) 800-160 MG per tablet 1 tablet  Status:  Discontinued     1 tablet Oral Daily 10/12/13 1428 10/13/13 1707   10/12/13 1600  ertapenem (INVANZ) 1 g in sodium chloride 0.9 % 50 mL IVPB  Status:  Discontinued     1 g 100 mL/hr over 30 Minutes Intravenous Every 24 hours 10/12/13 1507 10/24/13 1107       Time Spent in minutes   35 minutes   Kiran Carline D.O. on 10/31/2013 at 9:48 AM  Between 7am to 7pm - Pager - [phone]  After 7pm go to [email or web address] - password [email or web address]  And look for the night coverage person covering for me after hours  [facility] Office  [phone]    Subjective:   Martin Martin seen and examined today.  Patient has no complaints today. Patient denies dizziness, chest pain, shortness of breath, abdominal pain, N/V/D/C, new weakness, numbess, tingling.    Objective:   Filed Vitals:   10/30/13 1005 10/30/13 1249 10/30/13 2220 10/31/13 0535  BP: 125/85 129/88  103/72 125/81  Pulse: 138 118 125 101  Temp: 97.4 F (36.3 C) 98 F (36.7 C) 97.9 F (36.6 C) 97.6 F (36.4 C)  TempSrc: Oral Axillary Oral Oral  Resp: 20 19 18 20   Height:      Weight:      SpO2: 97% 100% 96% 95%    Wt Readings from Last 3 Encounters:  10/26/13 88.8 kg (195 lb  12.3 oz)  10/26/13 88.8 kg (195 lb 12.3 oz)  10/26/13 88.8 kg (195 lb 12.3 oz)     Intake/Output Summary (Last 24 hours) at 10/31/13 0948 Last data filed at 10/31/13 0535  Gross per 24 hour  Intake    765 ml  Output   1870 ml  Net  -1105 ml    Exam  General: Well developed, well nourished, NAD, appears stated age  HEENT: NCAT, PERRLA, mucous membranes moist.   Neck: Supple, no JVD, no masses  Cardiovascular: S1 S2 auscultated, Regular rate and rhythm.  Respiratory: Clear to auscultation bilaterally with equal chest rise  Abdomen: Soft, nontender, nondistended, + bowel sounds, hypoactive.  Open wound noted on abdomen, currently clean, no drainage.  Extremities: warm dry without cyanosis clubbing or edema  Neuro: AAOx3, Focal deficits  Skin: Without rashes exudates or nodules  Psych: Normal affect and demeanor with intact judgement and insight  Data Review   Micro Results No results found for this or any previous visit (from the past 240 hour(s)).  Radiology Reports Ct Head Wo Contrast  10/22/2013   CLINICAL DATA:  Evaluate for recent seizures. Glioblastoma status post surgery and radiotherapy.  EXAM: CT HEAD WITHOUT CONTRAST  TECHNIQUE: Contiguous axial images were obtained from the base of the skull through the vertex without contrast.  COMPARISON:  Metabolic brain scan 09/26/2013. MRI brain 09/08/13.  FINDINGS: Left parietal hypodensity at the surgical site with moderate surrounding vasogenic edema. No significant midline shift for impending herniation. As seen on image 23 cross-sectional measurements of 26 x 28 mm. There is slightly less mass effect when compared with prior MRI with  regard to regional vasogenic edema. There is no superimposed hemorrhage, midline shift, or new intracranial abnormalities. Unremarkable appearing craniotomy defect.  IMPRESSION: Left parietal lesion appears stable to slightly improved with regard to mass effect and surrounding edema. No new abnormalities are detected on this noncontrast exam. 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   Electronically Signed   By: Davonna Belling M.D.   On: 10/22/2013 14:41   Mr Laqueta Jean UJ Contrast  10/24/2013   CLINICAL DATA:  History of glioblastoma multiform and, new onset of recurrent seizures. Evaluate for tumor progression. History of tumor resection and radiotherapy.  EXAM: MRI HEAD WITHOUT AND WITH CONTRAST  TECHNIQUE: Multiplanar, multiecho pulse sequences of the brain and surrounding structures were obtained according to standard protocol without and with intravenous contrast  CONTRAST:  18mL MULTIHANCE GADOBENATE DIMEGLUMINE 529 MG/ML IV SOLN  COMPARISON:  Prior CT from 10/22/2013 and MRI from 09/08/2013.  FINDINGS: The post contrast sequences are somewhat degraded by motion artifact.  Previously identified left parietal mass is decreased in size now measuring approximately 4.9 x 2.9 x 3.4 cm (series 14, image 35). This lesion measured approximately 4.1 x 5.6 x 4.4 cm on prior study. Associated vasogenic edema at these improved with decreased T2/FLAIR signal seen within the adjacent left parietal white matter. There is improved mass effect on the adjacent posterior horn of the left lateral ventricle. Restricted diffusion seen centrally with numbness mass is present, like related to necrosis and/ or postoperative changes. An irregular peripheral rim hemosiderin deposition and/ or blood products is present about the central cavity of this lesion. There is persistent irregular peripheral rim enhancement about the margins of the lesion, improved as compared to the prior exam.   Sequelae of left parietal craniotomy is seen overlying the mass. There is no extra-axial fluid collection.  No  other abnormal foci of restricted diffusion are seen to suggest acute intracranial infarct. Normal flow voids are seen within the intracranial vasculature. No other mass lesion is identified. No mass effect is now seen within the brain. No other abnormal enhancement identified.  IMPRESSION: 1. Interval decrease in size of left parietal mass with improved vasogenic edema and resolved midline shift as compared to prior MRI from 09/08/2013. Persistent peripheral rim enhancement about this lesion may represent residual tumor versus radiation necrosis. 2. No other acute intracranial process identified within the brain.   Electronically Signed   By: Rise Mu M.D.   On: 10/24/2013 16:50   Ct Abdomen Pelvis W Contrast  10/23/2013   CLINICAL DATA:  Abdominal pain. Diverticulitis with colonic perforation.  EXAM: CT ABDOMEN AND PELVIS WITH CONTRAST  TECHNIQUE: Multidetector CT imaging of the abdomen and pelvis was performed using the standard protocol following bolus administration of intravenous contrast.  CONTRAST:  OMNIPAQUE IOHEXOL 300 MG/ML  SOLN  COMPARISON:  10/16/2013  FINDINGS: Atelectasis is identified within both lung bases. No focal liver abnormality identified. The gallbladder appears normal. No biliary dilatation. Normal appearance of the pancreas. The spleen is unremarkable.  The adrenal glands are both normal. Normal appearance of the right kidney. Left renal cyst is again identified. Nonobstructing calculus within the inferior pole of the left kidney measures 5 mm, image 51/ series 2. The urinary bladder appears within normal limits. Prostate gland and seminal vesicles are negative.  Normal caliber of the abdominal aorta. No upper abdominal adenopathy noted. There is no pelvic or inguinal adenopathy.  The stomach appears normal. Small bowel loops appear mildly increased in caliber  compatible with the clinical history of ileus. Again noted is extra luminal gas within the soft tissues of the left upper quadrant of the abdomen. Gas can be seen tracking around the stomach and into the posterior mediastinum. This appears similar to previous exam.  Multiple fluid collections are again noted within the upper abdomen. Index fluid collection ventral to the duodenum measures 4.1 cm, image 45/ series 2. Previously 3.7 cm. Index fluid collection posterior to the descending colon measures 4.8 cm, image 50/ series 2. This is compared with 5.6 cm previously. Fluid collection within the root of the mesenteric measures 4.1 x 1.9 cm, image 53/ series 2. Previously 5.2 x 2.8 cm. No new fluid collections identified. Similar appearance of percutaneous to drainage catheters within the central portion of the lower abdomen.  Review of the visualized osseous structures is significant for lumbar spondylosis. No aggressive lytic or sclerotic bone lesions.  IMPRESSION: 1. Multi focal fluid collections are again noted within the abdomen. When compared with the previous exam these are slightly decreased in size in the interval. No new or enlarging fluid collections identified.  2. Mild increased caliber of the small bowel loops compatible with ileus. No specific features identified to suggest high-grade bowel obstruction.  3. Again noted is extraluminal gas within the soft tissues of the left upper quadrant of the abdomen and extending into the posterior mediastinum. Findings a compatible with known perforated viscus.   Electronically Signed   By: Signa Kell M.D.   On: 10/23/2013 15:35   Ct Abdomen Pelvis W Contrast  10/16/2013   CLINICAL DATA:  Chronic perforation post surgery and drain placement, abdominal distention, question persistent extraluminal gas  EXAM: CT ABDOMEN AND PELVIS WITH CONTRAST  TECHNIQUE: Multidetector CT imaging of the abdomen and pelvis was performed using the standard protocol following  bolus administration  of intravenous contrast. Sagittal and coronal MPR images reconstructed from axial data set.  CONTRAST:  50mL OMNIPAQUE IOHEXOL 300 MG/ML SOLN, OMNIPAQUE IOHEXOL 300 MG/ML SOLN  COMPARISON:  10/12/2013  FINDINGS: Bibasilar atelectasis and minimal left pleural effusion.  Nasogastric tube extends into the 2nd portion of duodenum.  Multiple surgical drains present in abdomen, new.  Focus of air within urinary bladder likely reflects prior catheterization.  Mild fatty infiltration of liver.  4 mm nonobstructing calculus inferior pole left kidney image 45.  Liver, spleen, pancreas, and adrenal glands unremarkable.  Symmetric nephrograms with again identified 3.7 x 3.4 cm left renal cysts.  Extraluminal gas is identified under the left hemidiaphragm, within the mesenteric, an adjacent to the colon from the distal transverse the proximal descending segments consistent with prior colonic perforation.  Again identified thickening of a small bowel loop in the mid abdomen with a small adjacent fluid collection which measures 5.2 x 2.8 cm image 45, previously 5.1 x 3.2 cm.  Additional gas and fluid collection adjacent to the proximal descending colon, 5.6 x 3.8 cm image 43 previously 5.2 x 3.7 cm.  Additional fluid mesenteric fluid collections inferior to the pancreatic head appear stable.  Scattered diverticula of the descending and proximal sigmoid colon.  No evidence of bowel obstruction, with GI contrast present into the ascending colon.  Unremarkable bladder and ureters.  Remaining both bowel loops normal appearance.  No acute osseous findings.  Surgical clips and infiltration at umbilicus likely reflect interval laparoscopy with note of a tiny umbilical hernia.  IMPRESSION: Again seen foci of extraluminal gas compatible with colonic perforation similar to previous exam.  Interval placement of surgical drains.  Persistent focal collections of fluid with minimal gas are seen adjacent to the  ascending colon (minimally larger), inferior to pancreatic head (generally stable), and adjacent to a thickened small bowel loop in the mid abdomen (little changed).  Thickened small bowel loop could be due to ischemia, infection, or inflammatory bowel disease.  No definite new intra-abdominal or intrapelvic abnormalities identified.   Electronically Signed   By: Ulyses Southward M.D.   On: 10/16/2013 18:44   Ct Abdomen Pelvis W Contrast  10/12/2013   CLINICAL DATA:  Severe abdominal pain. History of CNS glioblastoma.  EXAM: CT ABDOMEN AND PELVIS WITH CONTRAST  TECHNIQUE: Multidetector CT imaging of the abdomen and pelvis was performed using the standard protocol following bolus administration of intravenous contrast.  CONTRAST:  OMNIPAQUE IOHEXOL 300 MG/ML  SOLN  COMPARISON:  10/11/2009  FINDINGS: Linear densities at the right lung base are suggestive for atelectasis. There is free intraperitoneal air in the left upper quadrant with air around the distal esophagus. There appears to be some air within the retroperitoneal space. The largest focus of air is around the descending colon and there is fluid or inflammation in this area. The fluid collection around the descending colon roughly measures 5.2 x 3.7 cm and could represent a site for abscess formation. Findings raise concern for acute diverticulitis in this area. Patient has extensive colonic diverticula. In addition, there is a loop of small bowel in the mid lower abdomen that demonstrates severe wall thickening measuring up to 1.1 cm. There is fluid and edema tracking from this abnormal loop of bowel into the mesentery above it. There is some fluid and small nodes throughout the central mesentery and adjacent to the duodenum.  No gross abnormality to the liver, gallbladder or portal venous system. No gross abnormality of the pancreas, spleen,  adrenal glands. There is mild perinephric stranding or edema around the kidneys which appears chronic. Probable  bilateral renal cysts. Calcification in the left kidney lower pole measures 4 mm and suggestive for nonobstructive stone.  No gross abnormality to the prostate, seminal vesicles or urinary bladder. No significant free fluid in the pelvis. No acute bone abnormality.  IMPRESSION: Study is positive for free air and suggestive for a bowel perforation. The largest focus of air is surrounding the descending colon. Patient has multiple colonic diverticula and findings could represent acute diverticulitis. There is fluid around the descending colon which may represent a potential site for abscess formation.  There is a severely thickened loop of small bowel in the mid abdomen. There is extensive edema and fluid tracking superior to this abnormal loop of bowel and towards the central mesentery. The patient is currently receiving chemotherapy and Avastin. Findings could represent a focal area of bowel ischemia from vasculitis. Findings may also associated with an infectious or inflammatory process.  Nonobstructive left kidney stone.  Renal cysts.  These results were called by telephone at the time of interpretation on 10/12/2013 at 12:27 PM to Dr. Rolland Porter , who verbally acknowledged these results.   Electronically Signed   By: Richarda Overlie M.D.   On: 10/12/2013 12:30   Dg Abd 2 Views  10/16/2013   CLINICAL DATA:  Distended abdomen. Status post laparoscopy for possible perforation, no perforation identified at time of surgery.  EXAM: ABDOMEN - 2 VIEW  COMPARISON:  CT of the abdomen and pelvis 10/12/2013.  FINDINGS: Nasogastric tube extends into the proximal stomach, with side port just distal to the gastroesophageal junction. There is some oral contrast material in the ascending colon and cecum. Gas is noted throughout the colon and there is a small amount of gas in nondilated loops of small bowel. A surgical drain is noted in the pelvis, and skin staples are seen projecting over the left side of the abdomen. Notably,  there are some irregular-shaped locular some gas projecting over the left upper quadrant of the abdomen, which are not clearly within either the colon or the stomach, suspicious for residual retroperitoneal gas as demonstrated on prior CT scan 10/12/2013.  IMPRESSION: 1. Probable persistent gas in the upper left retroperitoneum, suspicious for retroperitoneal perforation from the descending colon, as suggested on prior CT scan 10/12/2013. Given the recent negative laparoscopy, but persistently distended abdomen, further evaluation with repeat contrast-enhanced CT of the abdomen and pelvis may provide additional diagnostic information if clinically indicated. 2. Postoperative changes and support apparatus, as above. These results were called by telephone at the time of interpretation on 10/16/2013 at 1:49 PM to Dr. Lodema Pilot , who verbally acknowledged these results.   Electronically Signed   By: Trudie Reed M.D.   On: 10/16/2013 13:54    CBC  Recent Labs Lab 10/25/13 0510 10/26/13 0530 10/30/13 0529  WBC 14.5* 11.9* 9.5  HGB 11.6* 10.5* 11.9*  HCT 34.8* 31.8* 35.1*  PLT 266 237 213  MCV 98.6 98.8 99.2  MCH 32.9 32.6 33.6  MCHC 33.3 33.0 33.9  RDW 16.9* 16.6* 17.1*    Chemistries   Recent Labs Lab 10/25/13 0510 10/26/13 0530  NA 133* 136  K 4.4 4.0  CL 96 98  CO2 25 27  GLUCOSE 174* 147*  BUN 19 19  CREATININE 0.65 0.52  CALCIUM 9.1 8.6  MG  --  1.9  AST  --  47*  ALT  --  104*  ALKPHOS  --  108  BILITOT  --  0.2*   ------------------------------------------------------------------------------------------------------------------ estimated creatinine clearance is 111.8 ml/min (by C-G formula based on Cr of 0.52). ------------------------------------------------------------------------------------------------------------------ No results found for this basename: HGBA1C,  in the last 72  hours ------------------------------------------------------------------------------------------------------------------ No results found for this basename: CHOL, HDL, LDLCALC, TRIG, CHOLHDL, LDLDIRECT,  in the last 72 hours ------------------------------------------------------------------------------------------------------------------ No results found for this basename: TSH, T4TOTAL, FREET3, T3FREE, THYROIDAB,  in the last 72 hours ------------------------------------------------------------------------------------------------------------------ No results found for this basename: VITAMINB12, FOLATE, FERRITIN, TIBC, IRON, RETICCTPCT,  in the last 72 hours  Coagulation profile No results found for this basename: INR, PROTIME,  in the last 168 hours  No results found for this basename: DDIMER,  in the last 72 hours  Cardiac Enzymes No results found for this basename: CK, CKMB, TROPONINI, MYOGLOBIN,  in the last 168 hours ------------------------------------------------------------------------------------------------------------------ No components found with this basename: POCBNP,

## 2013-10-31 NOTE — Progress Notes (Signed)
Noted palliative care goals of care meeting. Inpatient rehabilitation admission not appropriate at this time given current medical findings. We will sign off. Please call me for any questions. [phone]

## 2013-10-31 NOTE — Progress Notes (Signed)
Palliative Medicine Team  Martin Martin is OOB with PT this morning- he is ambulating in the hallways with a walker and careful supervision (chair behind him)-he appears extremely weak and deconditioned. Interval events noted and his desire for no further aggressive interventions, however he has previously not wanted to talk about or hospice or have a formal meeting with our team stating he wasn't "ready" and also feared stressing his family or causing emotional strain on his wife who is also not well. Given his most recent CT findings and his stated goals for no surgery and his deconditioning, we will try to see how we can help him at this difficult point in his illness. The question that must be answered is really about how he wants to spend the time he has left-rehab is intensive therapy and his prognosis is extremely poor. He would be much more appropriate for inpatient hospice if he is truly embracing no further interventions with a focus on comfort and QOL.I will return later this afternoon or try to set up a specific time when his support system can be available to discuss this option and assist with care coordination.  Anderson Malta, DO Palliative Medicine

## 2013-11-01 ENCOUNTER — Telehealth: Payer: Self-pay | Admitting: Radiation Oncology

## 2013-11-01 DIAGNOSIS — Z515 Encounter for palliative care: Secondary | ICD-10-CM

## 2013-11-01 LAB — GLUCOSE, CAPILLARY
Glucose-Capillary: 137 mg/dL — ABNORMAL HIGH (ref 70–99)
Glucose-Capillary: 161 mg/dL — ABNORMAL HIGH (ref 70–99)

## 2013-11-01 LAB — HIV-1 RNA QUANT-NO REFLEX-BLD: HIV 1 RNA Quant: <20 copies/mL (ref <20)

## 2013-11-01 MED ORDER — DEXAMETHASONE 4 MG PO TABS
4.0000 mg | ORAL_TABLET | Freq: Two times a day (BID) | ORAL | Status: DC
  Administered 2013-11-01: 4 mg via ORAL
  Filled 2013-11-01 (×3): qty 1

## 2013-11-01 MED ORDER — DEXAMETHASONE 4 MG PO TABS
4.0000 mg | ORAL_TABLET | Freq: Once | ORAL | Status: AC
  Administered 2013-11-01: 4 mg via ORAL
  Filled 2013-11-01: qty 1

## 2013-11-01 NOTE — Progress Notes (Signed)
Physical Therapy Treatment Patient Details Name: Martin Martin MRN: [id number] DOB: [birth year] Today's Date: 11/01/2013 Time: 4098-1191 PT Time Calculation (min): 16 min  PT Assessment / Plan / Recommendation  History of Present Illness Pt admitted w/ dx: Diverticulitis of colon w/ perforation, abdominal pain. Pt w/ h/o Glioblastoma left parietal brain, status post biopsy/debulking in April 2014 followed by radiation and adjuvant chemotherapy consisting of Temodar, currently being managed with Avastin every 2 weeks. He subsequently was started on steroids secondary to progression of disease with right-sided weakness in September 2014. As of 10/24/13, RN staff, pt/family are reporting seizures as well.   PT Comments   Pt progressing with ambulation; continues to verbalize understanding of importance of pacing however refuses to take rest breaks.  Pt requires assistance to ambulate due to weakness, instability, poor safety awareness and judgment with RW.  Pt remains motivated to participate in PT and was ready to get OOB this morning.  Discussed with nurse tech to ambulate with pt later in the day.  Pt is now likely going to [facility] for Hospice care post d/c.  Follow Up Recommendations  Supervision/Assistance - 24 hour;Other (comment) (Pt likly d/c'ing to Hospice at [facility])     Does the patient have the potential to tolerate intense rehabilitation     Barriers to Discharge        Equipment Recommendations       Recommendations for Other Services    Frequency Min 4X/week   Progress towards PT Goals Progress towards PT goals: Progressing toward goals  Plan Discharge plan needs to be updated    Precautions / Restrictions Precautions Precautions: Fall;Other (comment) Precaution Comments: Seizure Restrictions Weight Bearing Restrictions: No   Pertinent Vitals/Pain no apparent distress     Mobility  Bed Mobility Bed Mobility: Sit to Supine;Sitting - Scoot to Edge of Bed Supine  to Sit: HOB elevated Sitting - Scoot to Edge of Bed: 5: Supervision Sit to Supine: 5: Supervision Transfers Transfers: Sit to Stand;Stand to Sit Sit to Stand: 4: Min guard;From elevated surface;With upper extremity assist Stand to Sit: With upper extremity assist;With armrests;To chair/3-in-1;4: Min guard Details for Transfer Assistance: Cues for hand placement Ambulation/Gait Ambulation/Gait Assistance: 4: Min guard Ambulation Distance (Feet): 220 Feet Assistive device: Rolling walker Ambulation/Gait Assistance Details: Pt pt verbalized understanding of pacing, however declined rest break after 100' after multiple cues to take rest break.  Min guard due to weakness and instability, no LOB. Gait Pattern: Step-through pattern;Decreased stride length;Narrow base of support Stairs: No    Exercises     PT Diagnosis:    PT Problem List:   PT Treatment Interventions:     PT Goals (current goals can now be found in the care plan section)    Visit Information  Last PT Received On: 11/01/13 Assistance Needed: +1 History of Present Illness: Pt admitted w/ dx: Diverticulitis of colon w/ perforation, abdominal pain. Pt w/ h/o Glioblastoma left parietal brain, status post biopsy/debulking in April 2014 followed by radiation and adjuvant chemotherapy consisting of Temodar, currently being managed with Avastin every 2 weeks. He subsequently was started on steroids secondary to progression of disease with right-sided weakness in September 2014. As of 10/24/13, RN staff, pt/family are reporting seizures as well.    Subjective Data      Cognition  Cognition Arousal/Alertness: Awake/alert Behavior During Therapy: WFL for tasks assessed/performed Area of Impairment: Problem solving;Following commands;Safety/judgement Following Commands: Follows multi-step commands inconsistently;Follows multi-step commands with increased time Safety/Judgement: Decreased awareness of safety;Decreased  awareness of  deficits Problem Solving: Slow processing;Requires verbal cues General Comments: Cues and assistance required for RW negotiation     Balance     End of Session PT - End of Session Equipment Utilized During Treatment: Gait belt Activity Tolerance: Patient tolerated treatment well Patient left: in chair;with call bell/phone within reach;with family/visitor present Nurse Communication: Mobility status   GP     Ernestina Columbia, SPTA 11/01/2013, 12:54 PM

## 2013-11-01 NOTE — Progress Notes (Signed)
Nutrition Follow Up -- Brief Note  Chart reviewed. Patient's goal of care has transitioned to comfort; extremely poor prognosis.  No further nutrition interventions warranted at this time.  Please re-consult RD as needed.   Maureen Chatters, RD, LDN Pager #: [phone] After-Hours Pager #: [phone]

## 2013-11-01 NOTE — Progress Notes (Signed)
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 Myna Hidalgo (Oncology)  -Continue Keppra 1500 mg twice a day as well as the impact 200 mg twice a day, Topiramate 50mg  BID, and Ativan PRN.  -Currently on Decadron 4 mgPO every 12 hours for edema in the brain. MRI 11/4 confirms Interval decrease in size of left parietal mass with improved vasogenic edema and resolved midline shift as compared to prior MRI.  -Palliative care for on board for discharge planning.   Diverticulitis of colon with perforation  -POD 18 s/p diagnostic laparoscopy and placement of drains  -Patient was noted to have wound dehiscence yesterday evening. Surgery has been made aware of this and will see the patient today. Wound does look clean, no purulent discharge.  -body fluid cultures: +Ecoli  -patient was started on ertapenem and swiched to Zosyn.  -Infectious disease is also following.  -Patient currently on Ceftrixone 2mg  IV daily and flagyl 500mg  TID.  Patient will need 2-4 weeks of antibiotics. Can be switched to oral flagyl once ileus has resolved.  -CT showed possible transverse colon pneumatosis/dilation. Laparotomy or colon resection were not recommended.   Hyperglycemia  -Secondary to steroids, will continue on Sliding scale insulin.   Hypertension  -continue Catapres patch and metoprolol.   Leukocytosis  -Likely secondary to steroids and diverticulitis, patient remains afebrile on review  Oral thrush  -Completed course of Diflucan, continue magic mouth wash   Code Status: DNR Family Communication: Discussed with patient and wife at bedside. Disposition Plan: To Residential Hospice once bed available   Consultants:  Palliative care  General surgery  Infectious  disease  Oncology  Neurology  Procedures:  Please refer to chart  Antibiotics:  Ceftriaxone  Metronidazole  HPI/Subjective: No new complaints today. He denies any abdominal discomfort. Is having bowel movements.  Objective: Filed Vitals:   11/01/13 0500  BP: 116/82  Pulse: 107  Temp: 97.4 F (36.3 C)  Resp: 20    Intake/Output Summary (Last 24 hours) at 11/01/13 1006 Last data filed at 11/01/13 0826  Gross per 24 hour  Intake    840 ml  Output      0 ml  Net    840 ml   Filed Weights   10/17/13 0500 10/23/13 1400 10/26/13 2100  Weight: 88.6 kg (195 lb 5.2 oz) 88.9 kg (195 lb 15.8 oz) 88.8 kg (195 lb 12.3 oz)    Exam:   General:  Pt in NAD, Alert and awake  Cardiovascular: Normal S1 and S2 with increased rate, no murmurs  Respiratory: CTA BL, no wheezes, speaking in full sentences  Abdomen: soft,drains in place, obese  Musculoskeletal: no cyanosis or clubbing   Data Reviewed: Basic Metabolic Panel:  Recent Labs Lab 10/26/13 0530  NA 136  K 4.0  CL 98  CO2 27  GLUCOSE 147*  BUN 19  CREATININE 0.52  CALCIUM 8.6  MG 1.9  PHOS 4.0   Liver Function Tests:  Recent Labs Lab 10/26/13 0530  AST 47*  ALT 104*  ALKPHOS 108  BILITOT 0.2*  PROT 5.4*  ALBUMIN 2.4*   No results found for this basename: LIPASE, AMYLASE,  in the last 168 hours No results found for this basename: AMMONIA,  in the last 168 hours CBC:  Recent Labs Lab 10/26/13 0530 10/30/13 0529  WBC 11.9*  9.5  HGB 10.5* 11.9*  HCT 31.8* 35.1*  MCV 98.8 99.2  PLT 237 213   Cardiac Enzymes: No results found for this basename: CKTOTAL, CKMB, CKMBINDEX, TROPONINI,  in the last 168 hours BNP (last 3 results) No results found for this basename: PROBNP,  in the last 8760 hours CBG:  Recent Labs Lab 10/31/13 0647 10/31/13 1121 10/31/13 1621 10/31/13 2139 11/01/13 0636  GLUCAP 111* 155* 144* 193* 137*    No results found for this or any previous visit (from the past  240 hour(s)).   Studies: Ct Abdomen Pelvis W Contrast  10/30/2013   CLINICAL DATA:  Perforated diverticulitis. Followup diverticular abscess following percutaneous catheter drainage.  EXAM: CT ABDOMEN AND PELVIS WITH CONTRAST  TECHNIQUE: Multidetector CT imaging of the abdomen and pelvis was performed using the standard protocol following bolus administration of intravenous contrast.  CONTRAST:  OMNIPAQUE IOHEXOL 300 MG/ML  SOLN  COMPARISON:  10/23/2013  FINDINGS: Two left abdominal percutaneous drains remain in place. Increased pneumatosis is seen involving the transverse colon, and there is increased extraluminal gas seen within the retroperitoneum and mesenteric fat which is predominantly extraperitoneal. Mild wall thickening and extraluminal fluid is again seen surrounding the proximal descending colon which is also involved by diverticulosis, and this could represent a focal area of diverticulitis. No other inflammatory process identified.  Hepatic steatosis noted but no liver masses are identified. Gallbladder, pancreas, spleen, adrenal glands, and right kidney are normal in appearance. Left renal cysts are noted, however there is no evidence of renal masses or hydronephrosis. No soft tissue masses or lymphadenopathy identified.  Increased mild small bowel dilatation is seen without evidence of transition point, and this is consistent with adynamic ileus.  IMPRESSION: Increased transverse colon pneumatosis and extraluminal gas which is probably extraperitoneal. This could be due to perforated diverticulitis involving the proximal sigmoid colon, however ischemic colitis cannot be excluded.  Worsening adynamic ileus.  Critical Value/emergent results were called by telephone at the time of interpretation on 10/30/2013 at 2:52 PM to the patient's floor nurse Annabelle Harman, who verbally acknowledged these results.   Electronically Signed   By: Myles Rosenthal M.D.   On: 10/30/2013 14:56    Scheduled Meds: .  antiseptic oral rinse  15 mL Mouth Rinse q12n4p  . calcium carbonate  1 tablet Oral Daily  . cefTRIAXone (ROCEPHIN)  IV  2 g Intravenous Q24H  . chlorhexidine  15 mL Mouth Rinse BID  . cloNIDine  0.1 mg Transdermal Weekly  . dexamethasone  4 mg Oral Q12H  . enoxaparin (LOVENOX) injection  40 mg Subcutaneous Q24H  . heparin flush  250 Units Intracatheter Once  . insulin aspart  0-9 Units Subcutaneous TID WC  . lacosamide (VIMPAT) IV  200 mg Intravenous Q12H  . levETIRAcetam  1,500 mg Intravenous Q12H  . lip balm   Topical BID  . metoprolol tartrate  25 mg Oral BID  . metroNIDAZOLE  500 mg Oral Q8H  . multivitamin with minerals  1 tablet Oral Daily  . pantoprazole  40 mg Oral BID  . polyethylene glycol  17 g Oral Daily  . saccharomyces boulardii  250 mg Oral BID  . sodium chloride  10-40 mL Intracatheter Q12H  . topiramate  50 mg Oral BID   Continuous Infusions: . 0.9 % NaCl with KCl 20 mEq / L 45 mL/hr at 11/01/13 6433    Principal Problem:   Diverticulitis of colon with perforation Active Problems:   Right sided weakness  Hyperglycemia   Thrush    Time spent: > 35 minutes    Penny Pia  [facility] Pager [phone] If 7PM-7AM, please contact night-coverage at [email or web address], password [facility] 11/01/2013, 10:06 AM  LOS: 20 days

## 2013-11-01 NOTE — Telephone Encounter (Signed)
Late entry from 10/31/2013. Returned call from patient's sister, Martin Martin. She reports that there has been no change in her brother's condition and that he is not a surgical candidate. She goes on to report the next step in plan of care is discharge to residential hospice. Attempted to provide comfort. Encouraged to call with future needs. She verbalized understand and great appreciation for the care provided to her brother.

## 2013-11-01 NOTE — Progress Notes (Signed)
Agree with SPTA.    Earnesteen Birnie, PT [phone]  

## 2013-11-01 NOTE — Progress Notes (Signed)
Long talk with patient.  No surgical therapy since this will only increase suffering and not improve his prognosis.  Wounds breaking down but on high dose steroids.  Recommend Hospice and get pt home ASAP.

## 2013-11-01 NOTE — Progress Notes (Signed)
19 Days Post-Op  Subjective: Pt doing well, tolerating regular diet.  No N/V.  Having BM's and flatus.  Seizures have stopped.  Pt wanting to go home and research residential hospice.  Pt talking about eating at good restaurants and in good spirits.  Pt notes no abdominal pain, but notes abdominal distension.  Objective: Vital signs in last 24 hours: Temp:  [97.2 F (36.2 C)-97.9 F (36.6 C)] 97.4 F (36.3 C) (11/12 0500) Pulse Rate:  [107-124] 107 (11/12 0500) Resp:  [20] 20 (11/12 0500) BP: (113-116)/(70-82) 116/82 mmHg (11/12 0500) SpO2:  [96 %-99 %] 96 % (11/12 0500) Last BM Date: 11/01/13  Intake/Output from previous day: 11/11 0701 - 11/12 0700 In: 660 [P.O.:660] Out: -  Intake/Output this shift: Total I/O In: 600 [P.O.:600] Out: -   PE: Gen:  Alert, NAD, pleasant Abd: Soft, distended, NT, +BS, no HSM, drains in place, one in LUQ with milky purulent drainage   Lab Results:   Recent Labs  10/30/13 0529  WBC 9.5  HGB 11.9*  HCT 35.1*  PLT 213   BMET No results found for this basename: NA, K, CL, CO2, GLUCOSE, BUN, CREATININE, CALCIUM,  in the last 72 hours PT/INR No results found for this basename: LABPROT, INR,  in the last 72 hours CMP     Component Value Date/Time   NA 136 10/26/2013 0530   NA 141 10/09/2013 1300   K 4.0 10/26/2013 0530   K 4.4 10/09/2013 1300   CL 98 10/26/2013 0530   CL 102 06/05/2013 1406   CO2 27 10/26/2013 0530   CO2 21* 10/09/2013 1300   GLUCOSE 147* 10/26/2013 0530   GLUCOSE 181* 10/09/2013 1300   GLUCOSE 165* 06/05/2013 1406   BUN 19 10/26/2013 0530   BUN 19.6 10/09/2013 1300   CREATININE 0.52 10/26/2013 0530   CREATININE 0.7 10/09/2013 1300   CALCIUM 8.6 10/26/2013 0530   CALCIUM 9.3 10/09/2013 1300   PROT 5.4* 10/26/2013 0530   PROT 6.9 10/09/2013 1300   ALBUMIN 2.4* 10/26/2013 0530   ALBUMIN 2.6* 10/09/2013 1300   AST 47* 10/26/2013 0530   AST 30 10/09/2013 1300   ALT 104* 10/26/2013 0530   ALT 124* 10/09/2013 1300   ALKPHOS  108 10/26/2013 0530   ALKPHOS 89 10/09/2013 1300   BILITOT 0.2* 10/26/2013 0530   BILITOT 0.35 10/09/2013 1300   GFRNONAA >90 10/26/2013 0530   GFRAA >90 10/26/2013 0530   Lipase     Component Value Date/Time   LIPASE 39 10/12/2013 1030       Studies/Results: Ct Abdomen Pelvis W Contrast  10/30/2013   CLINICAL DATA:  Perforated diverticulitis. Followup diverticular abscess following percutaneous catheter drainage.  EXAM: CT ABDOMEN AND PELVIS WITH CONTRAST  TECHNIQUE: Multidetector CT imaging of the abdomen and pelvis was performed using the standard protocol following bolus administration of intravenous contrast.  CONTRAST:  OMNIPAQUE IOHEXOL 300 MG/ML  SOLN  COMPARISON:  10/23/2013  FINDINGS: Two left abdominal percutaneous drains remain in place. Increased pneumatosis is seen involving the transverse colon, and there is increased extraluminal gas seen within the retroperitoneum and mesenteric fat which is predominantly extraperitoneal. Mild wall thickening and extraluminal fluid is again seen surrounding the proximal descending colon which is also involved by diverticulosis, and this could represent a focal area of diverticulitis. No other inflammatory process identified.  Hepatic steatosis noted but no liver masses are identified. Gallbladder, pancreas, spleen, adrenal glands, and right kidney are normal in appearance. Left renal cysts are  noted, however there is no evidence of renal masses or hydronephrosis. No soft tissue masses or lymphadenopathy identified.  Increased mild small bowel dilatation is seen without evidence of transition point, and this is consistent with adynamic ileus.  IMPRESSION: Increased transverse colon pneumatosis and extraluminal gas which is probably extraperitoneal. This could be due to perforated diverticulitis involving the proximal sigmoid colon, however ischemic colitis cannot be excluded.  Worsening adynamic ileus.  Critical Value/emergent results were called  by telephone at the time of interpretation on 10/30/2013 at 2:52 PM to the patient's floor nurse Annabelle Harman, who verbally acknowledged these results.   Electronically Signed   By: Myles Rosenthal M.D.   On: 10/30/2013 14:56    Anti-infectives: Anti-infectives   Start     Dose/Rate Route Frequency Ordered Stop   10/26/13 1400  metroNIDAZOLE (FLAGYL) tablet 500 mg     500 mg Oral 3 times per day 10/26/13 0945     10/25/13 1800  metroNIDAZOLE (FLAGYL) IVPB 500 mg  Status:  Discontinued     500 mg 100 mL/hr over 60 Minutes Intravenous Every 8 hours 10/25/13 1743 10/26/13 0945   10/25/13 1800  cefTRIAXone (ROCEPHIN) 2 g in dextrose 5 % 50 mL IVPB     2 g 100 mL/hr over 30 Minutes Intravenous Every 24 hours 10/25/13 1743     10/24/13 1200  piperacillin-tazobactam (ZOSYN) IVPB 3.375 g  Status:  Discontinued     3.375 g 12.5 mL/hr over 240 Minutes Intravenous Every 8 hours 10/24/13 1114 10/25/13 1742   10/13/13 1800  fluconazole (DIFLUCAN) IVPB 200 mg     200 mg 100 mL/hr over 60 Minutes Intravenous Every 24 hours 10/13/13 1707 10/22/13 1906   10/13/13 1715  ertapenem (INVANZ) 1 g in sodium chloride 0.9 % 50 mL IVPB  Status:  Discontinued     1 g 100 mL/hr over 30 Minutes Intravenous Every 24 hours 10/13/13 1707 10/13/13 1750   10/12/13 1600  fluconazole (DIFLUCAN) tablet 100 mg  Status:  Discontinued     100 mg Oral Daily 10/12/13 1428 10/13/13 1707   10/12/13 1600  sulfamethoxazole-trimethoprim (BACTRIM DS) 800-160 MG per tablet 1 tablet  Status:  Discontinued     1 tablet Oral Daily 10/12/13 1428 10/13/13 1707   10/12/13 1600  ertapenem (INVANZ) 1 g in sodium chloride 0.9 % 50 mL IVPB  Status:  Discontinued     1 g 100 mL/hr over 30 Minutes Intravenous Every 24 hours 10/12/13 1507 10/24/13 1107       Assessment/Plan POD #19 s/p diagnostic laparoscopy with placement of drains secondary to perforated viscus with localized peritonitis involving pelvic smallbowel loops (possible  diverticulitis) Increased transverse colon penumatosis with extraluminal gas Adynamic Ileus 1.  Plans to d/c home and pursue residential hospice 2.  HHN drain care ordered, continue IV antibiotics per ID 3.  No surgical interventions indicated or desired by the patient given his decision for hospice 4.  F/u with Dr. Derrell Lolling in 2-3 weeks, office should call with appt time 5.  Will sign off, please call with questions/concerns     LOS: 20 days    Martin, Martin Casamento 11/01/2013, 10:03 AM Pager: [phone]

## 2013-11-01 NOTE — Progress Notes (Signed)
Patient ZO:[id number] [place] [facility]      DOB: 1956-08-18      EAV:[id number]   Palliative Medicine Team at [facility] Progress Note    Subjective: Reviewed case with my partner Dr. Phillips Martin.  Patient seen and examined.  He is in good spirits. Was on the phone talking to his wife requesting transfer to "[place] [facility]"..   Which he thinks is the name of the Hospice home at [place].  He gets easily confused and does have short term memory deficits.  When I told him that [facility] is a nursing home not a Hospice he corrected himself stating , I want to go to that Hospice in [place].  Patient's sister and cousin are at the bedside but are not decision makers for or with this patient.  His spouse called to say that she can not take care of him at home.   The patient is lacking somewhat in judgement and repeats that he is going to get stronger and go home .  He does not seem to understand the  depth of his debility and is having trouble holding on to the information regarding his prognosis.  Explained to [facility] that Hospice is going to provide basic care and will promote dignity helping him with mobility but will the intent is not to rehabilitate him for the purposes of returning to full function.  Explained the difference between rehab and residential hospice to his family who want to promote to the patient that you go to hospice for rehab.  They expressed understanding.     Filed Vitals:   11/01/13 1011  BP: 128/90  Pulse: 121  Temp: 97.8 F (36.6 C)  Resp: 20   Physical exam:  General:  No acute distress oriented to himself , but confuses concepts easily PERRL,EOMi, anicteric, mmm Chest decreased but clear  CVS: tachy , S1, S2 Abd: distended firm, but not tender, no grimacing or resting pain Ext: warm, weak right upper extremity Neuro: some what impulsive, oriented to self but confuses names and concepts.   Last WBC count: 9.5.    Assessment and plan: 57 yr old white male with a glioblastoma  course complicated by a perforated viscous which is minimally symptomatic.  Plan was to complete antibiotics to promote comfort and comfort and patient had agreed to consider residential hospice placement.  Patient is able to express wishes but may not have full capacity for decision making due to memory loss.  Patient and family will need to talk with [facility] regarding their understanding of goals as at this time there is some controversy between the patient and the spouse regarding services being requested.  Patient remains hospice eligible but may still have a prognosis of weeks to months if this perforation remains stable.   1.  DNR  2.  Perforated Viscus s/p localized drainage and antibiotics: noted surgery recommending out patient follow up if this makes sense.  While patient appears to doing well at this time, could change especially with increasing pneumatosis. Antibiotics to continue for now.  3. Glioblastoma with seizures: continue steroids and keppra with prn ativan   Discussed case with Social worker regarding making [facility] Referral.  I am available to discuss goals of care with [facility] as we understand them.  Total time : 15 min  Martin Alkhatib L. Ladona Ridgel, MD MBA The Palliative Medicine Team at [facility] Phone: [phone] Pager: [phone]

## 2013-11-01 NOTE — Clinical Social Work Note (Addendum)
3:30pm- CSW received report from [facility] that there were no hospice beds available today and possibly none available tomorrow.  2:48pm- Spoke with pt wife regarding Hospice.  1st choice is [facility].  They have offered a bed pending bed availability.  Pt was denied at [facility] due to duration of expectancy.    1:02pm CSW left message with wife re: disposition: residential hospice.  Choice was presented to the patient; however pt seemed confused.  CSW will await a return call from patient wife re: hospice care.  Per Ladona Ridgel, MD family and pt requested [facility].  Pt information has been faxed to the local hospice homes.    [facility] does not have a bed available today and does not project a bed available tomorrow (though, of course, things could change).  CSW will continue to assist with disposition.  Vickii Penna, LCSWA [phone]  Clinical Social Work

## 2013-11-02 ENCOUNTER — Encounter (HOSPITAL_COMMUNITY): Payer: Self-pay | Admitting: General Surgery

## 2013-11-02 DIAGNOSIS — K631 Perforation of intestine (nontraumatic): Secondary | ICD-10-CM

## 2013-11-02 DIAGNOSIS — R5381 Other malaise: Secondary | ICD-10-CM

## 2013-11-02 LAB — GLUCOSE, CAPILLARY: Glucose-Capillary: 74 mg/dL (ref 70–99)

## 2013-11-02 MED ORDER — CLONIDINE HCL 0.1 MG/24HR TD PTWK: 0.1000 mg | MEDICATED_PATCH | TRANSDERMAL | Status: AC

## 2013-11-02 MED ORDER — DEXAMETHASONE 2 MG PO TABS
2.0000 mg | ORAL_TABLET | Freq: Three times a day (TID) | ORAL | Status: DC
  Administered 2013-11-02: 2 mg via ORAL
  Filled 2013-11-02 (×3): qty 1

## 2013-11-02 MED ORDER — ZOLPIDEM TARTRATE 5 MG PO TABS: 5.0000 mg | ORAL_TABLET | Freq: Every evening | ORAL | Status: AC | PRN

## 2013-11-02 MED ORDER — LEVETIRACETAM ER 750 MG PO TB24: 1500.0000 mg | ORAL_TABLET | Freq: Two times a day (BID) | ORAL | Status: AC

## 2013-11-02 MED ORDER — METOPROLOL TARTRATE 50 MG PO TABS
50.0000 mg | ORAL_TABLET | Freq: Two times a day (BID) | ORAL | Status: DC
  Administered 2013-11-02: 50 mg via ORAL
  Filled 2013-11-02 (×2): qty 1

## 2013-11-02 MED ORDER — SACCHAROMYCES BOULARDII 250 MG PO CAPS: 250.0000 mg | ORAL_CAPSULE | Freq: Two times a day (BID) | ORAL | Status: AC

## 2013-11-02 MED ORDER — AMOXICILLIN-POT CLAVULANATE 875-125 MG PO TABS
1.0000 | ORAL_TABLET | Freq: Two times a day (BID) | ORAL | Status: DC
  Administered 2013-11-02: 1 via ORAL
  Filled 2013-11-02 (×2): qty 1

## 2013-11-02 MED ORDER — METOPROLOL TARTRATE 50 MG PO TABS: 50.0000 mg | ORAL_TABLET | Freq: Two times a day (BID) | ORAL | Status: AC

## 2013-11-02 MED ORDER — TOPIRAMATE 50 MG PO TABS: 50.0000 mg | ORAL_TABLET | Freq: Two times a day (BID) | ORAL | Status: AC

## 2013-11-02 MED ORDER — DEXAMETHASONE 2 MG PO TABS: 2.0000 mg | ORAL_TABLET | Freq: Three times a day (TID) | ORAL | Status: AC

## 2013-11-02 MED ORDER — FENTANYL 50 MCG/HR TD PT72: 50.0000 ug | MEDICATED_PATCH | TRANSDERMAL | Status: AC

## 2013-11-02 MED ORDER — LACOSAMIDE 200 MG PO TABS: 200.0000 mg | ORAL_TABLET | Freq: Two times a day (BID) | ORAL | Status: AC

## 2013-11-02 MED ORDER — LORAZEPAM 2 MG/ML IJ SOLN: 2.0000 mg | INTRAMUSCULAR | Status: AC | PRN

## 2013-11-02 MED ORDER — AMOXICILLIN-POT CLAVULANATE 875-125 MG PO TABS: 1.0000 | ORAL_TABLET | Freq: Two times a day (BID) | ORAL | Status: AC

## 2013-11-02 MED ORDER — LACOSAMIDE 200 MG PO TABS
200.0000 mg | ORAL_TABLET | Freq: Two times a day (BID) | ORAL | Status: DC
Start: 2013-11-02 — End: 2013-11-02

## 2013-11-02 MED ORDER — LEVETIRACETAM ER 500 MG PO TB24
1500.0000 mg | ORAL_TABLET | Freq: Two times a day (BID) | ORAL | Status: DC
  Administered 2013-11-02: 1500 mg via ORAL
  Filled 2013-11-02 (×3): qty 3

## 2013-11-02 NOTE — Progress Notes (Signed)
Patient ID: Martin Martin, male   DOB: 16-Sep-1956, 57 y.o.   MRN: [id number] 20 Days Post-Op  Subjective: Pt feels ok today.  Some abdominal soreness.  Objective: Vital signs in last 24 hours: Temp:  [97.2 F (36.2 C)-98 F (36.7 C)] 97.7 F (36.5 C) (11/13 0948) Pulse Rate:  [99-129] 129 (11/13 0948) Resp:  [18-20] 18 (11/13 0948) BP: (106-140)/(56-97) 131/89 mmHg (11/13 0948) SpO2:  [96 %-100 %] 100 % (11/13 0948) Last BM Date: 11/01/13  Intake/Output from previous day: 11/12 0701 - 11/13 0700 In: 8290 [P.O.:840; I.V.:7290; IV Piggyback:160] Out: 862 [Urine:850; Drains:12] Intake/Output this shift: Total I/O In: -  Out: 375 [Urine:375]  PE: Abd: soft, essentially NT, ND, JP drain with a change in caliber of output.  It is more brown, bloody today than light tan cloudy.  This is not feculent and smells of pseudomonas.  The other drain remains stable  Lab Results:  No results found for this basename: WBC, HGB, HCT, PLT,  in the last 72 hours BMET No results found for this basename: NA, K, CL, CO2, GLUCOSE, BUN, CREATININE, CALCIUM,  in the last 72 hours PT/INR No results found for this basename: LABPROT, INR,  in the last 72 hours CMP     Component Value Date/Time   NA 136 10/26/2013 0530   NA 141 10/09/2013 1300   K 4.0 10/26/2013 0530   K 4.4 10/09/2013 1300   CL 98 10/26/2013 0530   CL 102 06/05/2013 1406   CO2 27 10/26/2013 0530   CO2 21* 10/09/2013 1300   GLUCOSE 147* 10/26/2013 0530   GLUCOSE 181* 10/09/2013 1300   GLUCOSE 165* 06/05/2013 1406   BUN 19 10/26/2013 0530   BUN 19.6 10/09/2013 1300   CREATININE 0.52 10/26/2013 0530   CREATININE 0.7 10/09/2013 1300   CALCIUM 8.6 10/26/2013 0530   CALCIUM 9.3 10/09/2013 1300   PROT 5.4* 10/26/2013 0530   PROT 6.9 10/09/2013 1300   ALBUMIN 2.4* 10/26/2013 0530   ALBUMIN 2.6* 10/09/2013 1300   AST 47* 10/26/2013 0530   AST 30 10/09/2013 1300   ALT 104* 10/26/2013 0530   ALT 124* 10/09/2013 1300   ALKPHOS 108 10/26/2013  0530   ALKPHOS 89 10/09/2013 1300   BILITOT 0.2* 10/26/2013 0530   BILITOT 0.35 10/09/2013 1300   GFRNONAA >90 10/26/2013 0530   GFRAA >90 10/26/2013 0530   Lipase     Component Value Date/Time   LIPASE 39 10/12/2013 1030       Studies/Results: No results found.  Anti-infectives: Anti-infectives   Start     Dose/Rate Route Frequency Ordered Stop   10/26/13 1400  metroNIDAZOLE (FLAGYL) tablet 500 mg     500 mg Oral 3 times per day 10/26/13 0945     10/25/13 1800  metroNIDAZOLE (FLAGYL) IVPB 500 mg  Status:  Discontinued     500 mg 100 mL/hr over 60 Minutes Intravenous Every 8 hours 10/25/13 1743 10/26/13 0945   10/25/13 1800  cefTRIAXone (ROCEPHIN) 2 g in dextrose 5 % 50 mL IVPB     2 g 100 mL/hr over 30 Minutes Intravenous Every 24 hours 10/25/13 1743     10/24/13 1200  piperacillin-tazobactam (ZOSYN) IVPB 3.375 g  Status:  Discontinued     3.375 g 12.5 mL/hr over 240 Minutes Intravenous Every 8 hours 10/24/13 1114 10/25/13 1742   10/13/13 1800  fluconazole (DIFLUCAN) IVPB 200 mg     200 mg 100 mL/hr over 60 Minutes Intravenous Every 24 hours  10/13/13 1707 10/22/13 1906   10/13/13 1715  ertapenem (INVANZ) 1 g in sodium chloride 0.9 % 50 mL IVPB  Status:  Discontinued     1 g 100 mL/hr over 30 Minutes Intravenous Every 24 hours 10/13/13 1707 10/13/13 1750   10/12/13 1600  fluconazole (DIFLUCAN) tablet 100 mg  Status:  Discontinued     100 mg Oral Daily 10/12/13 1428 10/13/13 1707   10/12/13 1600  sulfamethoxazole-trimethoprim (BACTRIM DS) 800-160 MG per tablet 1 tablet  Status:  Discontinued     1 tablet Oral Daily 10/12/13 1428 10/13/13 1707   10/12/13 1600  ertapenem (INVANZ) 1 g in sodium chloride 0.9 % 50 mL IVPB  Status:  Discontinued     1 g 100 mL/hr over 30 Minutes Intravenous Every 24 hours 10/12/13 1507 10/24/13 1107       Assessment/Plan  1. Perforated viscus, s/p abdominal washout with drain placement, POD 20 2. Stage 4 glioblastoma  Plan: 1. Cont with  drains 2. Will have him follow up with Dr. Derrell Lolling in 2 weeks  3. Palliative care said to continued abx therapy for now, unsure this is still necessary but will defer this to them.    LOS: 21 days    Nella Botsford E 11/02/2013, 10:33 AM Pager: [phone]

## 2013-11-02 NOTE — Progress Notes (Signed)
Palliative care. °

## 2013-11-02 NOTE — Discharge Summary (Addendum)
Physician Discharge Summary  RYKKER COVIELLO HCW:[id number] DOB: 19-Nov-1956 DOA: 10/12/2013  PCP: [name], MD  Admit date: 10/12/2013 Discharge date: 11/02/2013  Time spent: > 35 minutes  Recommendations for Outpatient Follow-up:  1. Please be sure to follow up with infectious disease and obtain a repeat CT scan within the next 3 weeks 2. Also followup with your general surgeon  Discharge Diagnoses:  Principal Problem:   Diverticulitis of colon with perforation Active Problems:   Right sided weakness   Hyperglycemia   Thrush   Discharge Condition:  stable to hospice  Diet recommendation: Low sodium heart healthy  Filed Weights   10/17/13 0500 10/23/13 1400 10/26/13 2100  Weight: 88.6 kg (195 lb 5.2 oz) 88.9 kg (195 lb 15.8 oz) 88.8 kg (195 lb 12.3 oz)    History of present illness:  57 yr old white male with a history of glioblastoma course complicated by a perforated viscous which is minimally symptomatic. 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 Marin Olp (Oncology)  -Continue Keppra 1500 mg twice a day as well asTopiramate 50mg  BID, and Ativan PRN. Addendum after discussion with sister concerning history of seizure medication will plan on adding vimpat on discharge. -Currently on Decadron 4 mgPO every 12 hours for edema in the brain. MRI 11/4 confirms Interval decrease in size of left parietal mass with improved vasogenic edema and resolved midline shift as compared to prior MRI.  -Palliative care for on board while patient was in house for discharge planning.   Diverticulitis of colon with perforation  -POD 18 s/p diagnostic laparoscopy and placement of drains  -Patient was noted to have wound dehiscence. Surgery has been made aware of this and  will see the patient today. Wound does look clean, no purulent discharge.  -body fluid cultures: +Ecoli  -Infectious disease is also following.  -Patient was on Ceftrixone 2mg  IV daily and flagyl 500mg  TID. On discharge will be changed to augmentin as recommended by ID for the next 4 weeks. With repeat CT of abdomen and pelvis at 3 weeks -CT showed possible transverse colon pneumatosis/dilation. Laparotomy or colon resection were not recommended by general surgery.   Hyperglycemia  -Secondary to steroids, will continue on Sliding scale insulin.   Hypertension  -continue Catapres patch and metoprolol.   Leukocytosis  -Likely secondary to steroids and diverticulitis, patient remains afebrile on review    Procedures:  Please review chart  Consultations:  Palliative care  General surgery  Oncology  Infectious disease  Discharge Exam: Filed Vitals:   11/02/13 0948  BP: 131/89  Pulse: 129  Temp: 97.7 F (36.5 C)  Resp: 18    General: Patient alert and awake Cardiovascular: Normal S1 and S2, no rub Respiratory: No increased work of breathing, no audible wheezes  Discharge Instructions  Discharge Orders   Future Appointments Provider Department Dept Phone   11/06/2013 9:30 AM [place] [phone]   11/06/2013 10:00 AM Vivien Rota, NP [place] [phone]   11/06/2013 11:00 AM [place] [phone]   11/14/2013 2:00 PM Michel Bickers, MD [facility] [phone]   11/20/2013 9:00 AM [place] [phone]  11/20/2013 9:30 AM Keitha Butte, NP [facility] MEDICAL ONCOLOGY [phone]   11/20/2013 10:30 AM [facility] ONCOL[facility] [phone]   11/20/2013 2:00 PM Ernestene Mention, MD [facility], [place] [phone]   11/23/2013 11:00 AM [facility] [place] [facility] AT [address] [phone]   Patient to arrive 15 minutes prior to appointment time.   11/27/2013 10:15 AM Oneita Hurt, MD [facility] RADIATION ONCOLOGY [phone]   12/04/2013 11:15 AM [place] [facility] MEDICAL ONCOLOGY [phone]   12/04/2013 11:45 AM Illa Level, NP [facility] MEDICAL ONCOLOGY [phone]   12/18/2013 11:15 AM [place] [facility] MEDICAL ONCOLOGY [phone]   12/18/2013 11:45 AM Illa Level, NP [facility] MEDICAL ONCOLOGY [phone]   Future Orders Complete By Expires   Call MD for:  severe uncontrolled pain  As directed    Call MD for:  temperature >100.4  As directed    Diet - low sodium heart healthy  As directed    Discharge instructions  As directed    Comments:     Please be sure to follow up with your general surgeon and infectious disease doctors.   Increase activity slowly  As directed        Medication List    STOP taking these medications       ALPRAZolam 0.25 MG tablet  Commonly known as:  XANAX     diltiazem 120 MG 24 hr capsule  Commonly known as:  CARDIZEM CD     fluconazole 100 MG tablet  Commonly known as:  DIFLUCAN     levETIRAcetam 500 MG tablet  Commonly known as:  KEPPRA  Replaced by:  Levetiracetam 750 MG Tb24     sulfamethoxazole-trimethoprim 800-160 MG per tablet  Commonly known as:  BACTRIM DS,SEPTRA DS      TAKE these medications       amoxicillin-clavulanate 875-125 MG per tablet  Commonly known as:  AUGMENTIN  Take 1 tablet by mouth every 12 (twelve) hours.     calcium carbonate 500 MG chewable tablet  Commonly known as:  TUMS - dosed in mg elemental calcium  Chew 1 tablet by mouth daily. For indigestion     dexamethasone 2 MG tablet  Commonly known as:  DECADRON  Take 1 tablet (2 mg total) by mouth  every 8 (eight) hours.     Levetiracetam 750 MG Tb24  Take 2 tablets (1,500 mg total) by mouth 2 (two) times daily.     LORazepam 2 MG/ML injection  Commonly known as:  ATIVAN  Inject 1 mL (2 mg total) into the vein every 4 (four) hours as needed for anxiety (seizures).     metoprolol 50 MG tablet  Commonly known as:  LOPRESSOR  Take 1 tablet (50 mg total) by mouth 2 (two) times daily.     pantoprazole 40 MG tablet  Commonly known as:  PROTONIX  Take 40 mg by mouth daily. take 1 tablet by mouth once daily     saccharomyces boulardii 250 MG capsule  Commonly known as:  FLORASTOR  Take 1 capsule (250 mg total) by mouth 2 (two) times daily.     topiramate 50 MG tablet  Commonly known as:  TOPAMAX  Take 1 tablet (50 mg total) by mouth 2 (two) times daily.     zolpidem 5 MG tablet  Commonly known  as:  AMBIEN  Take 1 tablet (5 mg total) by mouth at bedtime as needed for sleep.       Allergies  Allergen Reactions  . Dilaudid [Hydromorphone Hcl] Nausea And Vomiting  . Morphine And Related Nausea And Vomiting       Follow-up Information   Follow up with Ernestene Mention, MD On 11/20/2013. (2:00pm, arrive at 1:40pm)    Specialty:  General Surgery   Contact information:   [address] [place] [address] [phone]        The results of significant diagnostics from this hospitalization (including imaging, microbiology, ancillary and laboratory) are listed below for reference.    Significant Diagnostic Studies: Ct Head Wo Contrast  10/28/2013   CLINICAL DATA:  Sudden confusion and abnormal speech.  EXAM: CT HEAD WITHOUT CONTRAST  TECHNIQUE: Contiguous axial images were obtained from the base of the skull through the vertex without intravenous contrast.  COMPARISON:  10/22/2013 and 04/17/2013  FINDINGS: There is evidence of an ovoid region of solid and cystic density over the left posterior parietal region in the surgical bed of patient's known GBM unchanged.  There is mild adjacent edema without significant change. Ventricles and cisterns are within normal. There is no midline shift and no evidence of acute hemorrhage. There is no evidence of acute infarction. Evidence of a prior left posterior parietal craniotomy. Remainder the exam is unchanged.  IMPRESSION: No acute intracranial findings.  Stable changes over the left posterior prior region in the region of patient's known GBM.   Electronically Signed   By: Elberta Fortis M.D.   On: 10/28/2013 16:17   Ct Head Wo Contrast  10/22/2013   CLINICAL DATA:  Evaluate for recent seizures. Glioblastoma status post surgery and radiotherapy.  EXAM: CT HEAD WITHOUT CONTRAST  TECHNIQUE: Contiguous axial images were obtained from the base of the skull through the vertex without contrast.  COMPARISON:  Metabolic brain scan 09/26/2013. MRI brain 09/08/13.  FINDINGS: Left parietal hypodensity at the surgical site with moderate surrounding vasogenic edema. No significant midline shift for impending herniation. As seen on image 23 cross-sectional measurements of 26 x 28 mm. There is slightly less mass effect when compared with prior MRI with regard to regional vasogenic edema. There is no superimposed hemorrhage, midline shift, or new intracranial abnormalities. Unremarkable appearing craniotomy defect.  IMPRESSION: Left parietal lesion appears stable to slightly improved with regard to mass effect and surrounding edema. No new abnormalities are detected on this noncontrast exam. 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   Electronically Signed   By: Davonna Belling M.D.   On: 10/22/2013 14:41   Mr Laqueta Jean BJ Contrast  10/24/2013   CLINICAL DATA:  History of glioblastoma multiform and, new onset of recurrent seizures. Evaluate for tumor progression. History of tumor resection and radiotherapy.  EXAM: MRI HEAD WITHOUT AND WITH CONTRAST  TECHNIQUE: Multiplanar, multiecho pulse sequences of  the brain and surrounding structures were obtained according to standard protocol without and with intravenous contrast  CONTRAST:  18mL MULTIHANCE GADOBENATE DIMEGLUMINE 529 MG/ML IV SOLN  COMPARISON:  Prior CT from 10/22/2013 and MRI from 09/08/2013.  FINDINGS: The post contrast sequences are somewhat degraded by motion artifact.  Previously identified left parietal mass is decreased in size now measuring approximately 4.9 x 2.9 x 3.4 cm (series 14, image 35). This lesion measured approximately 4.1 x 5.6 x 4.4 cm on prior study. Associated vasogenic edema at these improved with decreased  T2/FLAIR signal seen within the adjacent left parietal white matter. There is improved mass effect on the adjacent posterior horn of the left lateral ventricle. Restricted diffusion seen centrally with numbness mass is present, like related to necrosis and/ or postoperative changes. An irregular peripheral rim hemosiderin deposition and/ or blood products is present about the central cavity of this lesion. There is persistent irregular peripheral rim enhancement about the margins of the lesion, improved as compared to the prior exam.  Sequelae of left parietal craniotomy is seen overlying the mass. There is no extra-axial fluid collection.  No other abnormal foci of restricted diffusion are seen to suggest acute intracranial infarct. Normal flow voids are seen within the intracranial vasculature. No other mass lesion is identified. No mass effect is now seen within the brain. No other abnormal enhancement identified.  IMPRESSION: 1. Interval decrease in size of left parietal mass with improved vasogenic edema and resolved midline shift as compared to prior MRI from 09/08/2013. Persistent peripheral rim enhancement about this lesion may represent residual tumor versus radiation necrosis. 2. No other acute intracranial process identified within the brain.   Electronically Signed   By: Rise Mu M.D.   On: 10/24/2013 16:50    Ct Abdomen Pelvis W Contrast  10/30/2013   CLINICAL DATA:  Perforated diverticulitis. Followup diverticular abscess following percutaneous catheter drainage.  EXAM: CT ABDOMEN AND PELVIS WITH CONTRAST  TECHNIQUE: Multidetector CT imaging of the abdomen and pelvis was performed using the standard protocol following bolus administration of intravenous contrast.  CONTRAST:  OMNIPAQUE IOHEXOL 300 MG/ML  SOLN  COMPARISON:  10/23/2013  FINDINGS: Two left abdominal percutaneous drains remain in place. Increased pneumatosis is seen involving the transverse colon, and there is increased extraluminal gas seen within the retroperitoneum and mesenteric fat which is predominantly extraperitoneal. Mild wall thickening and extraluminal fluid is again seen surrounding the proximal descending colon which is also involved by diverticulosis, and this could represent a focal area of diverticulitis. No other inflammatory process identified.  Hepatic steatosis noted but no liver masses are identified. Gallbladder, pancreas, spleen, adrenal glands, and right kidney are normal in appearance. Left renal cysts are noted, however there is no evidence of renal masses or hydronephrosis. No soft tissue masses or lymphadenopathy identified.  Increased mild small bowel dilatation is seen without evidence of transition point, and this is consistent with adynamic ileus.  IMPRESSION: Increased transverse colon pneumatosis and extraluminal gas which is probably extraperitoneal. This could be due to perforated diverticulitis involving the proximal sigmoid colon, however ischemic colitis cannot be excluded.  Worsening adynamic ileus.  Critical Value/emergent results were called by telephone at the time of interpretation on 10/30/2013 at 2:52 PM to the patient's floor nurse Annabelle Harman, who verbally acknowledged these results.   Electronically Signed   By: Myles Rosenthal M.D.   On: 10/30/2013 14:56   Ct Abdomen Pelvis W Contrast  10/23/2013    CLINICAL DATA:  Abdominal pain. Diverticulitis with colonic perforation.  EXAM: CT ABDOMEN AND PELVIS WITH CONTRAST  TECHNIQUE: Multidetector CT imaging of the abdomen and pelvis was performed using the standard protocol following bolus administration of intravenous contrast.  CONTRAST:  OMNIPAQUE IOHEXOL 300 MG/ML  SOLN  COMPARISON:  10/16/2013  FINDINGS: Atelectasis is identified within both lung bases. No focal liver abnormality identified. The gallbladder appears normal. No biliary dilatation. Normal appearance of the pancreas. The spleen is unremarkable.  The adrenal glands are both normal. Normal appearance of the right kidney. Left renal cyst  is again identified. Nonobstructing calculus within the inferior pole of the left kidney measures 5 mm, image 51/ series 2. The urinary bladder appears within normal limits. Prostate gland and seminal vesicles are negative.  Normal caliber of the abdominal aorta. No upper abdominal adenopathy noted. There is no pelvic or inguinal adenopathy.  The stomach appears normal. Small bowel loops appear mildly increased in caliber compatible with the clinical history of ileus. Again noted is extra luminal gas within the soft tissues of the left upper quadrant of the abdomen. Gas can be seen tracking around the stomach and into the posterior mediastinum. This appears similar to previous exam.  Multiple fluid collections are again noted within the upper abdomen. Index fluid collection ventral to the duodenum measures 4.1 cm, image 45/ series 2. Previously 3.7 cm. Index fluid collection posterior to the descending colon measures 4.8 cm, image 50/ series 2. This is compared with 5.6 cm previously. Fluid collection within the root of the mesenteric measures 4.1 x 1.9 cm, image 53/ series 2. Previously 5.2 x 2.8 cm. No new fluid collections identified. Similar appearance of percutaneous to drainage catheters within the central portion of the lower abdomen.  Review of the  visualized osseous structures is significant for lumbar spondylosis. No aggressive lytic or sclerotic bone lesions.  IMPRESSION: 1. Multi focal fluid collections are again noted within the abdomen. When compared with the previous exam these are slightly decreased in size in the interval. No new or enlarging fluid collections identified.  2. Mild increased caliber of the small bowel loops compatible with ileus. No specific features identified to suggest high-grade bowel obstruction.  3. Again noted is extraluminal gas within the soft tissues of the left upper quadrant of the abdomen and extending into the posterior mediastinum. Findings a compatible with known perforated viscus.   Electronically Signed   By: Kerby Moors M.D.   On: 10/23/2013 15:35   Ct Abdomen Pelvis W Contrast  10/16/2013   CLINICAL DATA:  Chronic perforation post surgery and drain placement, abdominal distention, question persistent extraluminal gas  EXAM: CT ABDOMEN AND PELVIS WITH CONTRAST  TECHNIQUE: Multidetector CT imaging of the abdomen and pelvis was performed using the standard protocol following bolus administration of intravenous contrast. Sagittal and coronal MPR images reconstructed from axial data set.  CONTRAST:  37mL OMNIPAQUE IOHEXOL 300 MG/ML SOLN, 165mL OMNIPAQUE IOHEXOL 300 MG/ML SOLN  COMPARISON:  10/12/2013  FINDINGS: Bibasilar atelectasis and minimal left pleural effusion.  Nasogastric tube extends into the 2nd portion of duodenum.  Multiple surgical drains present in abdomen, new.  Focus of air within urinary bladder likely reflects prior catheterization.  Mild fatty infiltration of liver.  4 mm nonobstructing calculus inferior pole left kidney image 45.  Liver, spleen, pancreas, and adrenal glands unremarkable.  Symmetric nephrograms with again identified 3.7 x 3.4 cm left renal cysts.  Extraluminal gas is identified under the left hemidiaphragm, within the mesenteric, an adjacent to the colon from the distal  transverse the proximal descending segments consistent with prior colonic perforation.  Again identified thickening of a small bowel loop in the mid abdomen with a small adjacent fluid collection which measures 5.2 x 2.8 cm image 45, previously 5.1 x 3.2 cm.  Additional gas and fluid collection adjacent to the proximal descending colon, 5.6 x 3.8 cm image 43 previously 5.2 x 3.7 cm.  Additional fluid mesenteric fluid collections inferior to the pancreatic head appear stable.  Scattered diverticula of the descending and proximal sigmoid colon.  No  evidence of bowel obstruction, with GI contrast present into the ascending colon.  Unremarkable bladder and ureters.  Remaining both bowel loops normal appearance.  No acute osseous findings.  Surgical clips and infiltration at umbilicus likely reflect interval laparoscopy with note of a tiny umbilical hernia.  IMPRESSION: Again seen foci of extraluminal gas compatible with colonic perforation similar to previous exam.  Interval placement of surgical drains.  Persistent focal collections of fluid with minimal gas are seen adjacent to the ascending colon (minimally larger), inferior to pancreatic head (generally stable), and adjacent to a thickened small bowel loop in the mid abdomen (little changed).  Thickened small bowel loop could be due to ischemia, infection, or inflammatory bowel disease.  No definite new intra-abdominal or intrapelvic abnormalities identified.   Electronically Signed   By: Ulyses Southward M.D.   On: 10/16/2013 18:44   Ct Abdomen Pelvis W Contrast  10/12/2013   CLINICAL DATA:  Severe abdominal pain. History of CNS glioblastoma.  EXAM: CT ABDOMEN AND PELVIS WITH CONTRAST  TECHNIQUE: Multidetector CT imaging of the abdomen and pelvis was performed using the standard protocol following bolus administration of intravenous contrast.  CONTRAST:  OMNIPAQUE IOHEXOL 300 MG/ML  SOLN  COMPARISON:  10/11/2009  FINDINGS: Linear densities at the right lung  base are suggestive for atelectasis. There is free intraperitoneal air in the left upper quadrant with air around the distal esophagus. There appears to be some air within the retroperitoneal space. The largest focus of air is around the descending colon and there is fluid or inflammation in this area. The fluid collection around the descending colon roughly measures 5.2 x 3.7 cm and could represent a site for abscess formation. Findings raise concern for acute diverticulitis in this area. Patient has extensive colonic diverticula. In addition, there is a loop of small bowel in the mid lower abdomen that demonstrates severe wall thickening measuring up to 1.1 cm. There is fluid and edema tracking from this abnormal loop of bowel into the mesentery above it. There is some fluid and small nodes throughout the central mesentery and adjacent to the duodenum.  No gross abnormality to the liver, gallbladder or portal venous system. No gross abnormality of the pancreas, spleen, adrenal glands. There is mild perinephric stranding or edema around the kidneys which appears chronic. Probable bilateral renal cysts. Calcification in the left kidney lower pole measures 4 mm and suggestive for nonobstructive stone.  No gross abnormality to the prostate, seminal vesicles or urinary bladder. No significant free fluid in the pelvis. No acute bone abnormality.  IMPRESSION: Study is positive for free air and suggestive for a bowel perforation. The largest focus of air is surrounding the descending colon. Patient has multiple colonic diverticula and findings could represent acute diverticulitis. There is fluid around the descending colon which may represent a potential site for abscess formation.  There is a severely thickened loop of small bowel in the mid abdomen. There is extensive edema and fluid tracking superior to this abnormal loop of bowel and towards the central mesentery. The patient is currently receiving chemotherapy and  Avastin. Findings could represent a focal area of bowel ischemia from vasculitis. Findings may also associated with an infectious or inflammatory process.  Nonobstructive left kidney stone.  Renal cysts.  These results were called by telephone at the time of interpretation on 10/12/2013 at 12:27 PM to Dr. Rolland Porter , who verbally acknowledged these results.   Electronically Signed   By: Meriel Pica.D.  On: 10/12/2013 12:30   Dg Abd 2 Views  10/16/2013   CLINICAL DATA:  Distended abdomen. Status post laparoscopy for possible perforation, no perforation identified at time of surgery.  EXAM: ABDOMEN - 2 VIEW  COMPARISON:  CT of the abdomen and pelvis 10/12/2013.  FINDINGS: Nasogastric tube extends into the proximal stomach, with side port just distal to the gastroesophageal junction. There is some oral contrast material in the ascending colon and cecum. Gas is noted throughout the colon and there is a small amount of gas in nondilated loops of small bowel. A surgical drain is noted in the pelvis, and skin staples are seen projecting over the left side of the abdomen. Notably, there are some irregular-shaped locular some gas projecting over the left upper quadrant of the abdomen, which are not clearly within either the colon or the stomach, suspicious for residual retroperitoneal gas as demonstrated on prior CT scan 10/12/2013.  IMPRESSION: 1. Probable persistent gas in the upper left retroperitoneum, suspicious for retroperitoneal perforation from the descending colon, as suggested on prior CT scan 10/12/2013. Given the recent negative laparoscopy, but persistently distended abdomen, further evaluation with repeat contrast-enhanced CT of the abdomen and pelvis may provide additional diagnostic information if clinically indicated. 2. Postoperative changes and support apparatus, as above. These results were called by telephone at the time of interpretation on 10/16/2013 at 1:49 PM to Dr. Madilyn Hook , who  verbally acknowledged these results.   Electronically Signed   By: Vinnie Langton M.D.   On: 10/16/2013 13:54    Microbiology: No results found for this or any previous visit (from the past 240 hour(s)).   Labs: Basic Metabolic Panel: No results found for this basename: NA, K, CL, CO2, GLUCOSE, BUN, CREATININE, CALCIUM, MG, PHOS,  in the last 168 hours Liver Function Tests: No results found for this basename: AST, ALT, ALKPHOS, BILITOT, PROT, ALBUMIN,  in the last 168 hours No results found for this basename: LIPASE, AMYLASE,  in the last 168 hours No results found for this basename: AMMONIA,  in the last 168 hours CBC:  Recent Labs Lab 10/30/13 0529  WBC 9.5  HGB 11.9*  HCT 35.1*  MCV 99.2  PLT 213   Cardiac Enzymes: No results found for this basename: CKTOTAL, CKMB, CKMBINDEX, TROPONINI,  in the last 168 hours BNP: BNP (last 3 results) No results found for this basename: PROBNP,  in the last 8760 hours CBG:  Recent Labs Lab 11/01/13 1111 11/01/13 1657 11/01/13 2205 11/02/13 0655 11/02/13 1133  GLUCAP 121* 161* 140* 74 175*       Signed:  Velvet Bathe  [facility] 11/02/2013, 12:42 PM

## 2013-11-02 NOTE — Social Work (Signed)
Working with patient and family for likely residential hospice placement today- Spoke with [facility] and they anticipate a bed later today- updated MD and will need out of facility DNR and d/c summary for transfer-  Reece Levy, MSW, [employer] [phone]

## 2013-11-02 NOTE — Social Work (Signed)
Hospice bed available at [facility] today-patient and family agreeable to this plan- patient is very irritable at times today- sisters at bedside and wife to meet him at the facility- EMS to transport-  Reece Levy, MSW, [employer] [phone]

## 2013-11-02 NOTE — Progress Notes (Signed)
I saw Mr. Pulsifer this morning. Unfortunately, he truly has "declared himself". His abdominal surgical wounds are breaking down. Looks like he may have another perforation within the intestine. Being on the steroids for his recurrent seizures so I have not helped. He has not had seizures now as far as I can tell 6 days. I'll continue to taper down the Decadron.  He is very upbeat today. He is looking forward to get out of the hospital. He does have the memory issues. I think he may understand that he is going to the hospice facility in [facility].  I left a message for his wife last night. I told her she gets her to call me at any time.  I totally agree and support comfort dignity and respect. He's been through a lot. The fact that his cancer recurred so quickly was an ominous a bed. He then developed the bowel perforation and had surgery for that. He then had seizures i that were tough to control but finally have been controlled on 3 anti-seizure medications. Finally, he now has this abdominal issue. It appears that is not that symptomatic from it which is a blessing. He certainly is not septic. He said he's been eating.  His physical exam is fairly unremarkable. Blood pressure 140/97. Abdomen is distended. There are some bowel sounds are present. He does have some increased tympany to percussion. There is no tenderness to palpation. Cardiac exam is regular rate and rhythm. Extremities shows no clubbing cyanosis or edema.  As always, we had a great prayer session. His faith continues to remains strong. He certainly is not afraid of dying because he knows where he is going.  Unfortunately, there is not much more that I can do at this point. Hopefully he will be able to go to the hospice facility before the weekend.  Is a real good Michelle Piper who unfortunately had a very difficult problem that ultimately was not going to be cured.  Pete E.  Hebrews 4:16

## 2013-11-02 NOTE — Progress Notes (Signed)
Reports given to Alden Server in [facility],Pt A&Ox4 in no acute distress left facility in stretcher accompanied by sisters and [facility] staff.Pertinent documents provided to [facility]. Explained discharged summary to pt and family. Documents signed and verbalized  Understanding of instructions. No concerns/issues raised.

## 2013-11-02 NOTE — Progress Notes (Signed)
UR complete.  Joshu Furukawa RN, MSN 

## 2013-11-06 ENCOUNTER — Ambulatory Visit: Payer: BC Managed Care – PPO | Admitting: Family

## 2013-11-06 ENCOUNTER — Other Ambulatory Visit: Payer: BC Managed Care – PPO | Admitting: Lab

## 2013-11-06 ENCOUNTER — Ambulatory Visit: Payer: BC Managed Care – PPO

## 2013-11-10 ENCOUNTER — Telehealth (INDEPENDENT_AMBULATORY_CARE_PROVIDER_SITE_OTHER): Payer: Self-pay | Admitting: *Deleted

## 2013-11-10 ENCOUNTER — Ambulatory Visit (INDEPENDENT_AMBULATORY_CARE_PROVIDER_SITE_OTHER): Payer: BC Managed Care – PPO | Admitting: General Surgery

## 2013-11-10 ENCOUNTER — Encounter (INDEPENDENT_AMBULATORY_CARE_PROVIDER_SITE_OTHER): Payer: Self-pay | Admitting: General Surgery

## 2013-11-10 VITALS — BP 120/70 | HR 90 | Temp 97.0°F | Resp 18 | Ht 72.0 in | Wt 190.0 lb

## 2013-11-10 DIAGNOSIS — K5732 Diverticulitis of large intestine without perforation or abscess without bleeding: Secondary | ICD-10-CM

## 2013-11-10 DIAGNOSIS — K5720 Diverticulitis of large intestine with perforation and abscess without bleeding: Secondary | ICD-10-CM

## 2013-11-10 NOTE — Telephone Encounter (Signed)
Spoke to Dr. Derrell Lolling who said he isn't sure how he can help the patient when he is at a facility however he is willing to see the patient today at 1p if patient and family feel it urgent to see him.  Spoke to [employer] who was tearful and stated yes they really need to see him today.  She states they will be here for the appt.

## 2013-11-10 NOTE — Progress Notes (Signed)
Patient ID: Martin Martin, male   DOB: February 05, 1956, 57 y.o.   MRN: [id number] History: This gentleman has unresectable, recurrent glioblastoma. He has limited prognosis.He has recently been on high-dose steroids and avastin and followed by Dr. Drue Second. Recent admission to the hospital revealed some thickening of the sigmoid colon and some thickening of the ileum and some free air. He did not want a colon resection with colostomy due to quality of life issues. We did not think he could survive a major operation due to his high dose steroids and recent Avastin use. I took him to the operating room and performed laparoscopy, placed placement of drains in the pelvis, left paracolic gutter, and then right lower quadrant. The majority of purulence was actually between loops of small bowel in the right lower quadrant. There was no feces or obstruction. Hospital course was complicated by seizures necessitating antibiotic change and increasing his steroids. His seizures now controlled on anti-seizure medication and they have taper his steroids back down. He was transferred from the hospital to hospice of [facility] and has been inpatient. He is going home today but was fearful and called our office and I told him I would see him. I have discussed his care with Dr. Cleone Slim who is the hospice of [facility]  Physician.  He is tolerating a regular diet and enjoying meals. No nausea or vomiting. He has one or 2 semi-formed bowel movements per day. No chills. He has abdominal pain but it is no worse and no better than it has been for the past 3 or 4 weeks. He had some wound breakdown at his umbilicus but no hernia or evisceration.His two drains are draining purulent fluid  He is on Augmentin. His goals are to be at home with his family, to have his pain controlled, and to eat and drink as he wishes.  Exam: Patient is alert. Pleasant. He ambulates with a walker. Require some assistance to get him on the exam table. 97.0.  Heart rate 93 respiratory rate 18. BP 120/70. Weight 190 pounds. Lungs clear to auscultation Abdomen protuberant but less so. Mild diffuse tenderness. Bowel sounds present. Trocar site and umbilicus is open but the fascia is intact. 2 drains on the left side draining purulent fluid smells like Escherichia coli.  Assessment:  Progressive, recurrent glioblastoma of the brain. Limited prognosis Steroid dependent Presumed diverticulosis of colon with contained perforation. Seizure disorder  Plan :Discharge home [facility] has apparently been requested Continue the 2 drains and the Augmentin Diet as tolerated. Triggers for readmission the hospital would  be uncontrolled pain, fever and chills, nausea and vomiting, or any deterioration that would require inpatient support We had had several conversations about goals of care We talked about quality-of-life being more important to him that quantity of life. He is in full agreement. He knows that he would not tolerate an abdominal operation and so that is not an option in the future.    Angelia Mould. Martin Martin, M.D., [facility] General and Minimally invasive Surgery Breast and Colorectal Surgery Office:   [phone] Pager:   [phone]

## 2013-11-10 NOTE — Patient Instructions (Signed)
Both of the drainage tubes in your abdomen seemed to be working, and I recommend that we leave those in for now.  Continue the antibiotic pills.  I think it is okay for you to go home and see how things go.  If you develop fever greater than 100.5, shaking chills, nausea or vomiting, or worsening abdominal pain , then you will need to go back to the hospital.  We had a long talk about goals of care, quality-of-life, and what we can and cannot do surgically. I think we agree on these issues.  Return to see Dr. Derrell Lolling in 3 weeks. Give [place] a call sooner if there are problems.

## 2013-11-10 NOTE — Telephone Encounter (Signed)
Olegario Messier called to morning to ask about getting patient in as soon as possible to be seen.  Patient was suppose to be discharged from hospice since he was improving however yesterday patient began running a low grade fever (99sF) and having drainage come from around the drain sites.  They have now told patient he may need to stay longer since his appt with Dr. Derrell Lolling is not until December 1st, 2014.  Patient states he can't stay there that long or he may not leave.  Olegario Messier states that patient is very upset and tearful.  Olegario Messier is asking if there is any way patient can be seen today due to new symptoms and to make sure there are no issues.  Explained that I will send a message to Dr. Derrell Lolling to review and decide then we can let her know.  Olegario Messier states understanding and agreeable at this time.

## 2013-11-13 ENCOUNTER — Telehealth: Payer: Self-pay | Admitting: Radiation Oncology

## 2013-11-13 NOTE — Telephone Encounter (Signed)
Phoned patient's sister to assess the patient's condition. She reports that he continues to reside at [facility]. She explains he was adamant to go home a week ago so she took him home then, shortly thereafter took him back. She reports he just lays in the bed at the hospice house and does nothing. She reports he is no longer having seizures and has a clear head. She reports that he want abdominal surgery now even if "he could die." She states,"he says his quality of life can't get any worse than it is now." She questions if he should still have the MRI on 11/23/2013. Explained this writer would contact her back after speaking with Dr. Kathrynn Running.

## 2013-11-14 ENCOUNTER — Inpatient Hospital Stay: Payer: BC Managed Care – PPO | Admitting: Internal Medicine

## 2013-11-14 NOTE — Telephone Encounter (Signed)
Thanks Sam,  We should cancel his MRI.  MM

## 2013-11-15 ENCOUNTER — Telehealth: Payer: Self-pay | Admitting: Radiation Oncology

## 2013-11-15 NOTE — Telephone Encounter (Signed)
Phoned Martin Martin, patient's sister, this morning. Per Dr. Broadus John ordered explained we would be cancelling the 11/23/2013 MRI. Olegario Messier reports they have faxed her brother's records to [facility] for review by "a world renowned Designer, industrial/product." Encouraged her to call with future needs and she verbalized understanding.

## 2013-11-20 ENCOUNTER — Encounter (INDEPENDENT_AMBULATORY_CARE_PROVIDER_SITE_OTHER): Payer: Self-pay | Admitting: General Surgery

## 2013-11-20 ENCOUNTER — Ambulatory Visit (INDEPENDENT_AMBULATORY_CARE_PROVIDER_SITE_OTHER): Payer: BC Managed Care – PPO | Admitting: General Surgery

## 2013-11-20 ENCOUNTER — Ambulatory Visit: Payer: BC Managed Care – PPO | Admitting: Family

## 2013-11-20 ENCOUNTER — Other Ambulatory Visit: Payer: BC Managed Care – PPO | Admitting: Lab

## 2013-11-20 ENCOUNTER — Ambulatory Visit: Payer: BC Managed Care – PPO

## 2013-11-20 VITALS — BP 134/86 | HR 78 | Temp 97.2°F | Resp 18 | Ht 72.0 in | Wt 187.6 lb

## 2013-11-20 DIAGNOSIS — K5720 Diverticulitis of large intestine with perforation and abscess without bleeding: Secondary | ICD-10-CM

## 2013-11-20 DIAGNOSIS — K5732 Diverticulitis of large intestine without perforation or abscess without bleeding: Secondary | ICD-10-CM

## 2013-11-20 NOTE — Progress Notes (Signed)
Patient ID: Martin Martin, male   DOB: [birth year], 57 y.o.   MRN: [id number] History: Martin Martin returns today and has an entirely different approach and attitude to management of Martin abdominal problems. He states that he now has to have somebody operate on him that he can't live this way any more with the drains. He has been inpatient hospice in [facility] and Dr. Cleone Slim has been caring for him.Marland Kitchen He still has 2 drains. He says he doesn't have much pain at all. He states he is tolerating a diet and having normal bowel movements. He has not refer him to a university for another opinion.  We had a long talk about the goals of care. We talked about surgical intervention and that he probably would not survive this, and that even if he did Martin quality of life would be significantly worse than it is now. My comments cannot seem to matter, and he simply wants  Martin Martin to operate on  him and wants a second opinion. Background history is as follows: This gentleman has unresectable, recurrent glioblastoma. He has limited prognosis.Dr. Cleone Slim states 2 months prognosis. He has recently been on high-dose steroids and avastin and followed by Dr. Drue Second. He has had no Avastin for 5 or 6 weeks. He now takes Decadron 2 mg 3 times a day to prevent seizures and so cerebral edema.Recent admission to the hospital revealed some thickening of the sigmoid colon and some thickening of the ileum and some free air. He did not want a colon resection with colostomy due to quality of life issues. We did not think he could survive a major operation due to Martin high dose steroids and recent Avastin use. I took him to the operating room and performed laparoscopy, placed placement of drains in the pelvis, left paracolic gutter, and then right lower quadrant. The majority of purulence was actually between loops of small bowel in the right lower quadrant. There was no feces or obstruction. Hospital course was complicated by seizures  necessitating antibiotic change and increasing Martin steroids. Martin seizures now controlled on anti-seizure medication and they have taper Martin steroids back down. He was transferred from the hospital to hospice of [facility] and has been inpatient. The drain continued to put out low-volume purulent material. No enteric content.  Exam: Patient is alert and friendly and cooperative. Nonbelligerent hostile at all. Angulates with a walker. Her course of her amount of assistance to get him on the exam table. Afebrile. Her tachycardia. Does not appear toxic. Lungs clear to auscultation Abdomen protuberant but not as much as before. Almost no tenderness. No rebound. Small open wound at Martin umbilicus that has the granulation tissue but it is intact. 2 drains in the left side draining low-volume preop fluid smelled like Escherichia coli  Assessment: Excellent progressive, recurrent glioblastoma of the corpora brain. Limited prognosis. Standard steroid-dependent Present diverticulosis of colon with contained perforation. GI function has normalized excellent seizure disorder  Plan: We discussed numerous options. One option was to simply remove the drains supervised lidocaine antibiotics. He says that he and Martin Martin are afraid that we'll call sepsis and so we did not do that. This offered to get a CT scan to reassess Martin intra-abdominal problems. He declined that. I told him that it was my advice for him not to undergo a major abdominal operations for the reasons stated above. He will be referred to Dr. Donalynn Furlong at [facility] for a second opinion regarding surgical management  of Martin intra-abdominal problems and goals of care. I will be happy to see Martin Martin back at any time should he choose to return to my office.   Angelia Mould. Derrell Lolling, M.D., [facility] General and Minimally invasive Surgery Breast and Colorectal Surgery Office:   [phone] Pager:    [phone]

## 2013-11-20 NOTE — Patient Instructions (Signed)
We have had a long discussion about options for care going forward.  You have stated that you have changed your  mind and now want someone to operate on you.    Dr. Derrell Lolling has an informed you that you would probably not survive the operation, and if you did survive the operation that your quality of life would be significantly worse than it is now. Dr. Derrell Lolling advises against surgery.  You have requested a second opinion, and you will be sent to a surgeon in [place] at the [facility] for an opinion  Please contact Dr. Derrell Lolling as needed.

## 2013-11-22 ENCOUNTER — Telehealth (INDEPENDENT_AMBULATORY_CARE_PROVIDER_SITE_OTHER): Payer: Self-pay | Admitting: *Deleted

## 2013-11-22 NOTE — Telephone Encounter (Signed)
LMOM for pt to return my call.  I was calling pt to inform him that his appt is set up with Dr. Tollie Eth a general surgeon at [facility] on 12/10 with an arrival time of 9:30am.  They will be mailing pt a packet with instructions and directions to their facility.

## 2013-11-22 NOTE — Telephone Encounter (Signed)
Pts sister Micael Hampshire called back and appt information below was given.  She was agreeable with this appt.

## 2013-11-23 ENCOUNTER — Telehealth: Payer: Self-pay | Admitting: Radiation Oncology

## 2013-11-23 ENCOUNTER — Other Ambulatory Visit: Payer: BC Managed Care – PPO

## 2013-11-23 NOTE — Telephone Encounter (Signed)
Phoned patient's sister, Olegario Messier, to assess status. Olegario Messier reports that her brother remains in residential hospice. She reports they saw Dr. Derrell Lolling last week and "Kristan begged him to do abdominal surgery but, he refused." She goes on to explain that Dr. Derrell Lolling strongly believes Jakobi will not survive the surgery and if he does his quality of life would decline. She states, "but Sherril insisted on a referral to some who would do the surgery." As a result, Polo is scheduled to see Dr. Reather Laurence at [facility] (an oncology surgeon) next week to discuss his case. Will continue to follow patient status. Encouraged to call with future needs. Understanding verbalized.

## 2013-11-27 ENCOUNTER — Ambulatory Visit: Payer: BC Managed Care – PPO | Admitting: Radiation Oncology

## 2013-12-04 ENCOUNTER — Ambulatory Visit: Payer: BC Managed Care – PPO | Admitting: Adult Health

## 2013-12-04 ENCOUNTER — Other Ambulatory Visit: Payer: BC Managed Care – PPO | Admitting: Lab

## 2013-12-11 ENCOUNTER — Telehealth: Payer: Self-pay | Admitting: Radiation Oncology

## 2013-12-11 NOTE — Telephone Encounter (Signed)
Opened in error

## 2013-12-11 NOTE — Telephone Encounter (Signed)
Phoned Lynden Ang, patient's sister, to assess patient status. Lynden Ang was with her brother and allowed me to speak with him. Confusion noted. Spoke back with Lynden Ang who confirmed the physician at [facility] stated there was nothing further they could do. Also, she expressed her brother is quickly declining. She goes on to say tearfully, "we didn't think he was going to make it through the weekend." Attempted to console and comfort her.

## 2013-12-18 ENCOUNTER — Telehealth: Payer: Self-pay | Admitting: Radiation Oncology

## 2013-12-18 ENCOUNTER — Ambulatory Visit: Payer: BC Managed Care – PPO | Admitting: Adult Health

## 2013-12-18 ENCOUNTER — Other Ambulatory Visit: Payer: BC Managed Care – PPO | Admitting: Lab

## 2013-12-18 NOTE — Telephone Encounter (Signed)
Received call from patient's sister, Olegario Messier. She reports her brother isn't doing well. She goes on to explain that at her brother's request steroids and antibiotics have been stopped. She reports her brother wishes to pass on. He explains he is receiving haldol tid now. Listened and attempted to provide support. Encouraged to call with future needs. She verbalized understanding and expressed appreciation for "all our help."

## 2013-12-25 ENCOUNTER — Telehealth: Payer: Self-pay | Admitting: Radiation Oncology

## 2013-12-25 NOTE — Telephone Encounter (Signed)
Received call from patient's sister, Martin Martin, that he expired over the weekend. Expressed condolenses and provided comfort.

## 2014-01-21 DEATH — deceased

## 2014-03-13 ENCOUNTER — Telehealth: Payer: Self-pay | Admitting: Radiation Oncology

## 2014-03-13 ENCOUNTER — Encounter: Payer: Self-pay | Admitting: Radiation Oncology

## 2014-03-13 NOTE — Telephone Encounter (Signed)
Left message for Martin Martin that requested letter is ready for pick up.

## 2014-03-13 NOTE — Telephone Encounter (Signed)
Deceased patient's wife, Martin Martin, left message requesting letter from Dr. Tammi Klippel detailing her husband's diagnosis and that he was disabled as a result. She explained in the message she is trying to complete taxes for the year and her accounted requested this. Phoned her back at home. No answer. Left message explaining her message was received and this office would contact her once letter is ready.

## 2014-03-13 NOTE — Telephone Encounter (Signed)
Can you forward to clerical to generate a letter stating that this patient was diagnosed with a fatal brain tumor in 2014, and was disabled as a result.  MM

## 2014-08-14 ENCOUNTER — Other Ambulatory Visit: Payer: Self-pay | Admitting: Pharmacist

## 2014-12-15 IMAGING — CT CT ABD-PELV W/ CM
2 of 6 series · 16 of 46 positions shown, 18 images · IV contrast (OMNIPAQUE)
Comparison: 10/12/2013

CLINICAL DATA: Chronic perforation post surgery and drain
placement, abdominal distention, question persistent extraluminal
gas

EXAM:
CT ABDOMEN AND PELVIS WITH CONTRAST
TECHNIQUE: Multidetector CT imaging of the abdomen and pelvis was performed
using the standard protocol following bolus administration of
intravenous contrast. Sagittal and coronal MPR images reconstructed
from axial data set.
CONTRAST:  50mL OMNIPAQUE IOHEXOL 300 MG/ML SOLN, 100mL OMNIPAQUE
IOHEXOL 300 MG/ML SOLN

[Series 2: rtn a/p with · axial · 0.85mm/px · z∈[+1196,+1616]mm · 13 of 96 slices shown, 15 images]
[im 6/96  soft-tissue]
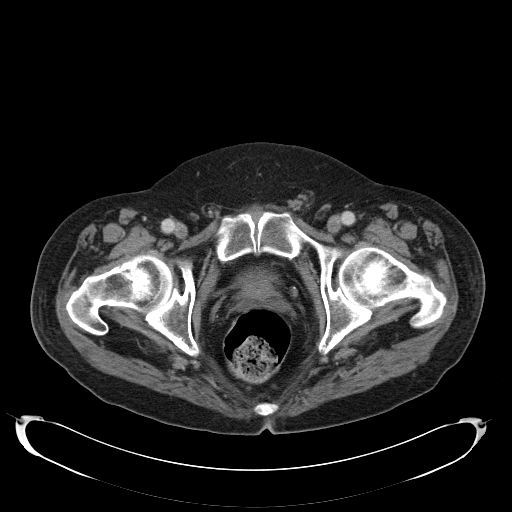
[im 6/96  bone]
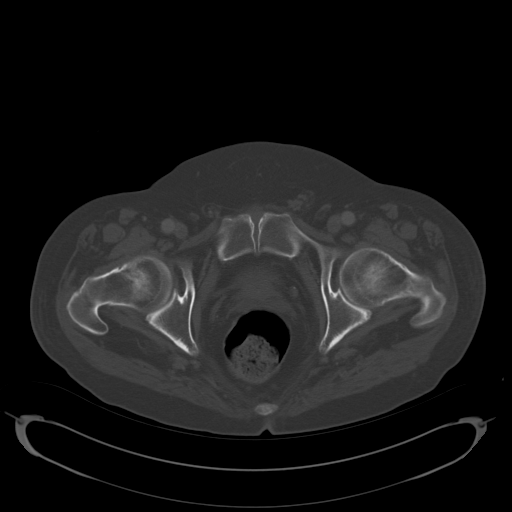
[im 11/96  soft-tissue]
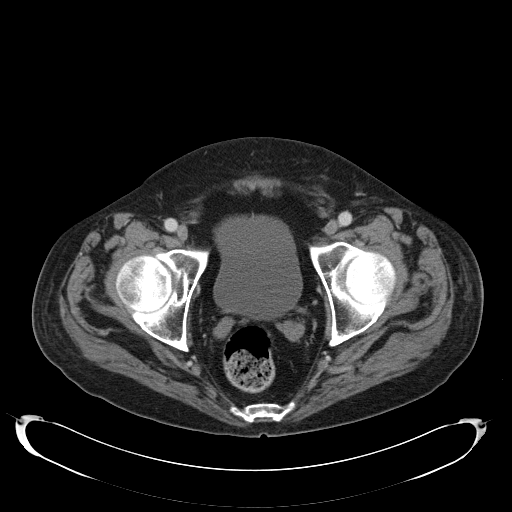
[im 22/96  soft-tissue]
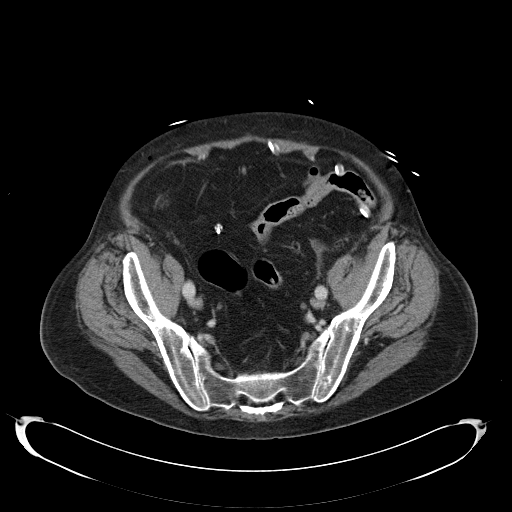
[im 27/96  soft-tissue]
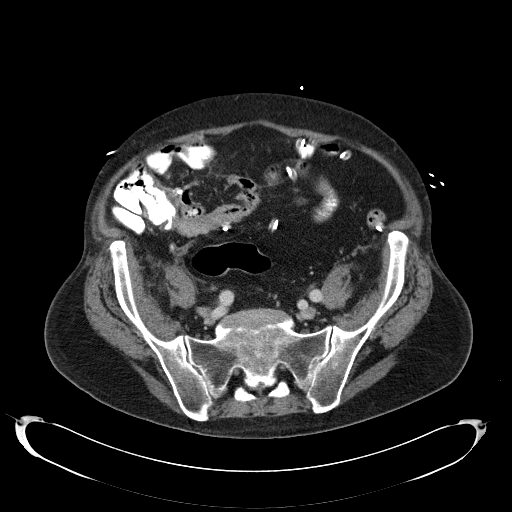
[im 32/96  soft-tissue]
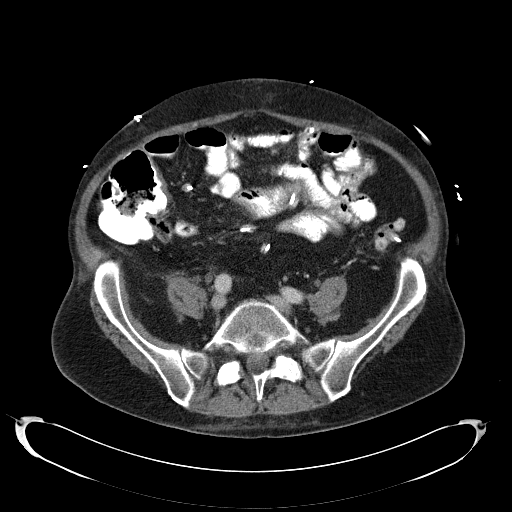
[im 43/96  soft-tissue]
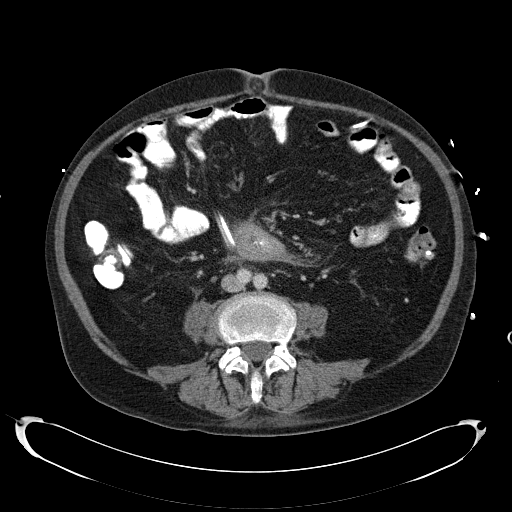
[im 48/96  soft-tissue]
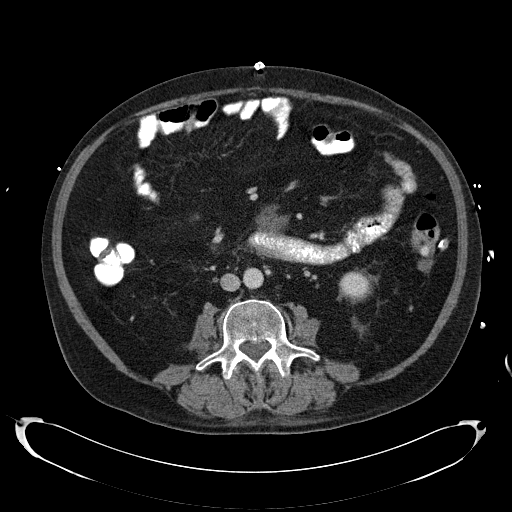
[im 53/96  soft-tissue]
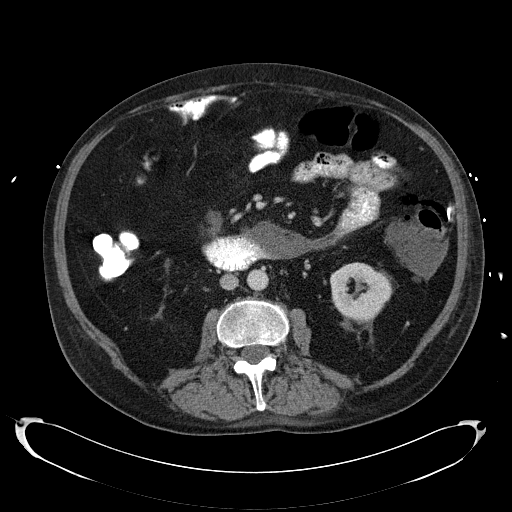
[im 64/96  soft-tissue]
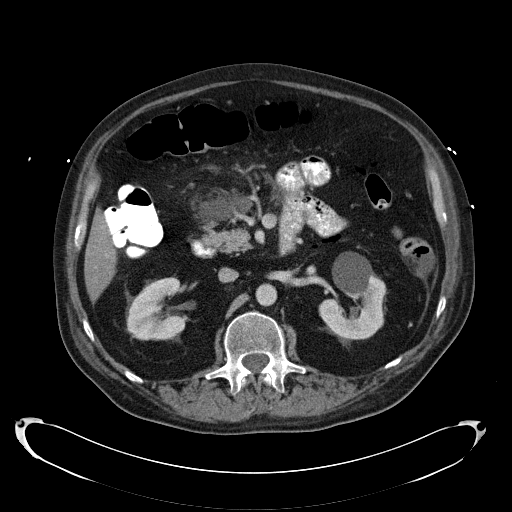
[im 64/96  bone]
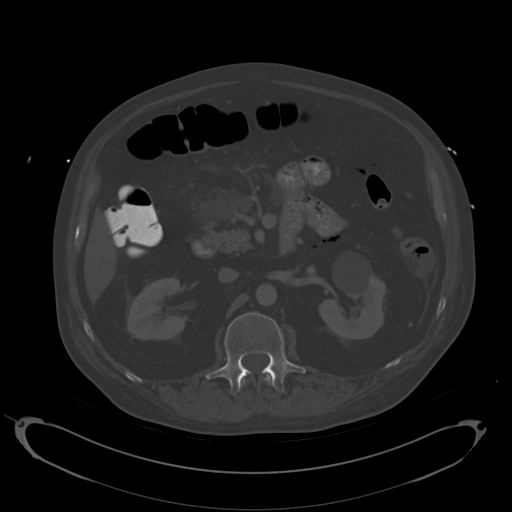
[im 69/96  soft-tissue]
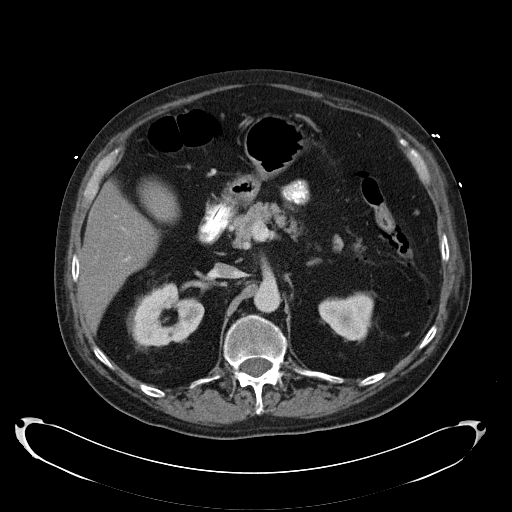
[im 74/96  soft-tissue]
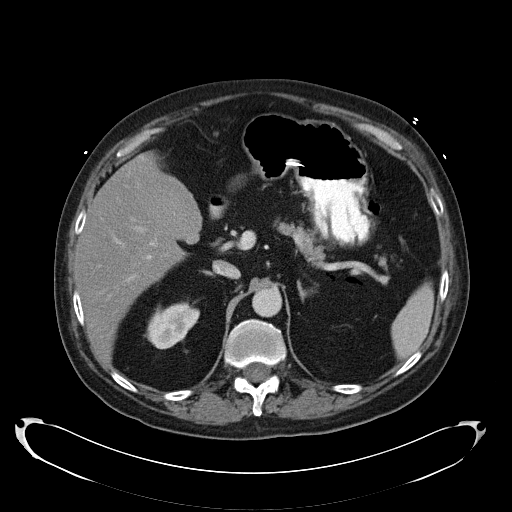
[im 85/96  soft-tissue]
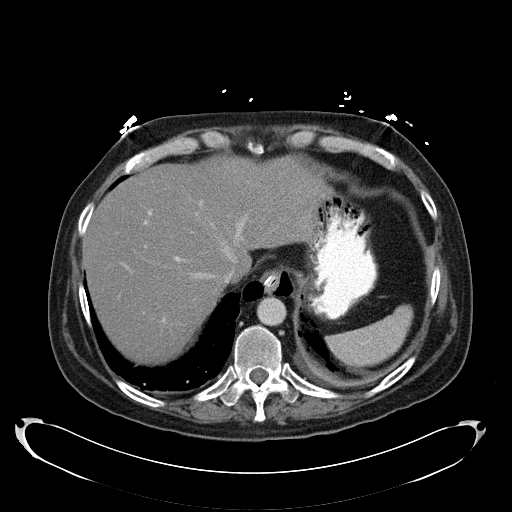
[im 90/96  soft-tissue]
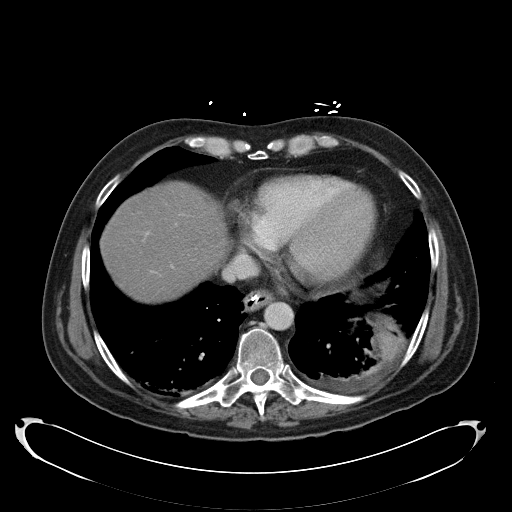

[Series 602: <mpr thick range> · coronal · 0.93mm/px · 3 of 170 slices shown]
[im 57/170  soft-tissue]
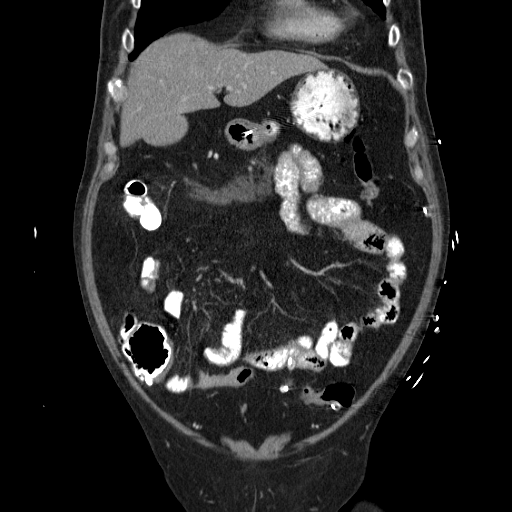
[im 76/170  soft-tissue]
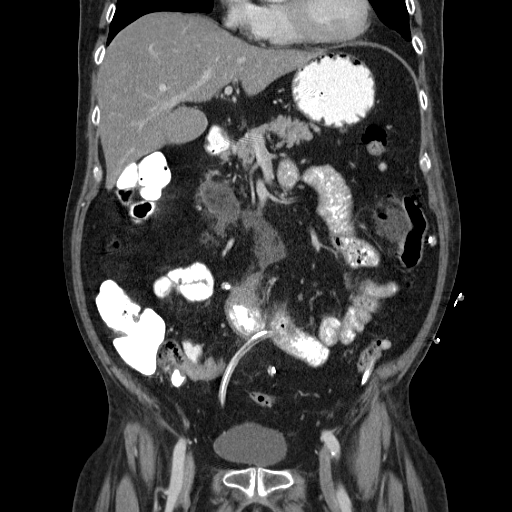
[im 94/170  soft-tissue]
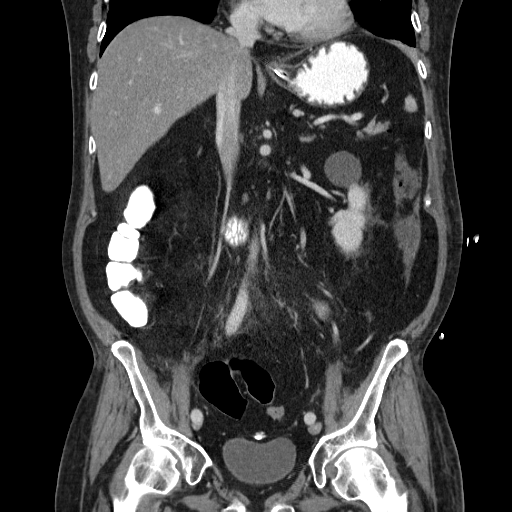

[16 of 46 positions shown; findings below may reference images not displayed]

FINDINGS: Bibasilar atelectasis and minimal left pleural effusion.

Nasogastric tube extends into the 2nd portion of duodenum.

Multiple surgical drains present in abdomen, new.

Focus of air within urinary bladder likely reflects prior
catheterization.

Mild fatty infiltration of liver.

4 mm nonobstructing calculus inferior pole left kidney image 45.

Liver, spleen, pancreas, and adrenal glands unremarkable.

Symmetric nephrograms with again identified 3.7 x 3.4 cm left renal
cysts.

Extraluminal gas is identified under the left hemidiaphragm, within
the mesenteric, an adjacent to the colon from the distal transverse
the proximal descending segments consistent with prior colonic
perforation.

Again identified thickening of a small bowel loop in the mid abdomen
with a small adjacent fluid collection which measures 5.2 x 2.8 cm
image 45, previously 5.1 x 3.2 cm.

Additional gas and fluid collection adjacent to the proximal
descending colon, 5.6 x 3.8 cm image 43 previously 5.2 x 3.7 cm.

Additional fluid mesenteric fluid collections inferior to the
pancreatic head appear stable.

Scattered diverticula of the descending and proximal sigmoid colon.

No evidence of bowel obstruction, with GI contrast present into the
ascending colon.

Unremarkable bladder and ureters.

Remaining both bowel loops normal appearance.

No acute osseous findings.

Surgical clips and infiltration at umbilicus likely reflect interval
laparoscopy with note of a tiny umbilical hernia.
IMPRESSION: Again seen foci of extraluminal gas compatible with colonic
perforation similar to previous exam.

Interval placement of surgical drains.

Persistent focal collections of fluid with minimal gas are seen
adjacent to the ascending colon (minimally larger), inferior to
pancreatic head (generally stable), and adjacent to a thickened
small bowel loop in the mid abdomen (little changed).

Thickened small bowel loop could be due to ischemia, infection, or
inflammatory bowel disease.

No definite new intra-abdominal or intrapelvic abnormalities
identified.
